# Patient Record
Sex: Female | Born: 1945 | Race: White | Hispanic: No | Smoking: Never smoker
Health system: Southern US, Community
[De-identification: ages and names within clinical notes are randomized; demographics above are authoritative.]

## PROBLEM LIST (undated history)

## (undated) DIAGNOSIS — E785 Hyperlipidemia, unspecified: Secondary | ICD-10-CM

## (undated) DIAGNOSIS — I8290 Acute embolism and thrombosis of unspecified vein: Secondary | ICD-10-CM

## (undated) DIAGNOSIS — I509 Heart failure, unspecified: Secondary | ICD-10-CM

## (undated) DIAGNOSIS — M1008 Idiopathic gout, vertebrae: Secondary | ICD-10-CM

## (undated) DIAGNOSIS — Z5189 Encounter for other specified aftercare: Secondary | ICD-10-CM

## (undated) DIAGNOSIS — N183 Chronic kidney disease, stage 3 unspecified: Secondary | ICD-10-CM

## (undated) DIAGNOSIS — E119 Type 2 diabetes mellitus without complications: Secondary | ICD-10-CM

## (undated) DIAGNOSIS — N289 Disorder of kidney and ureter, unspecified: Secondary | ICD-10-CM

## (undated) DIAGNOSIS — I519 Heart disease, unspecified: Secondary | ICD-10-CM

## (undated) DIAGNOSIS — H269 Unspecified cataract: Secondary | ICD-10-CM

## (undated) DIAGNOSIS — M109 Gout, unspecified: Secondary | ICD-10-CM

## (undated) DIAGNOSIS — Z4502 Encounter for adjustment and management of automatic implantable cardiac defibrillator: Secondary | ICD-10-CM

## (undated) DIAGNOSIS — I10 Essential (primary) hypertension: Secondary | ICD-10-CM

## (undated) DIAGNOSIS — I447 Left bundle-branch block, unspecified: Secondary | ICD-10-CM

## (undated) HISTORY — PX: INSERT / REPLACE / REMOVE PACEMAKER: SUR710

## (undated) HISTORY — DX: Encounter for adjustment and management of automatic implantable cardiac defibrillator: Z45.02

## (undated) HISTORY — DX: Acute embolism and thrombosis of unspecified vein: I82.90

## (undated) HISTORY — DX: Unspecified cataract: H26.9

## (undated) HISTORY — DX: Heart disease, unspecified: I51.9

## (undated) HISTORY — PX: EXPLORATORY LAPAROTOMY: SUR591

## (undated) HISTORY — DX: Encounter for other specified aftercare: Z51.89

## (undated) HISTORY — DX: Essential (primary) hypertension: I10

## (undated) HISTORY — PX: EYE SURGERY: SHX253

## (undated) HISTORY — DX: Hyperlipidemia, unspecified: E78.5

## (undated) HISTORY — DX: Idiopathic gout, vertebrae: M10.08

## (undated) HISTORY — DX: Gout, unspecified: M10.9

## (undated) HISTORY — DX: Disorder of kidney and ureter, unspecified: N28.9

---

## 1995-05-02 ENCOUNTER — Emergency Department: Admit: 1995-05-02 | Payer: Self-pay | Source: Emergency Department | Admitting: Emergency Medical Services

## 1995-05-03 ENCOUNTER — Inpatient Hospital Stay
Admission: EM | Admit: 1995-05-03 | Disposition: A | Discharge status: Home / Self Care | Payer: Self-pay | Source: Emergency Department | Admitting: Cardiovascular Disease

## 2000-03-13 HISTORY — PX: COLONOSCOPY: SHX174

## 2000-04-12 ENCOUNTER — Inpatient Hospital Stay
Admission: EM | Admit: 2000-04-12 | Disposition: A | Discharge status: Home / Self Care | Payer: Self-pay | Source: Emergency Department | Admitting: Internal Medicine

## 2003-07-26 ENCOUNTER — Emergency Department: Admit: 2003-07-26 | Payer: Self-pay | Source: Emergency Department | Admitting: Pediatric Emergency Medicine

## 2004-06-22 ENCOUNTER — Ambulatory Visit
Admit: 2004-06-22 | Disposition: A | Discharge status: Home / Self Care | Payer: Self-pay | Source: Ambulatory Visit | Admitting: Specialist

## 2005-04-15 ENCOUNTER — Emergency Department: Admit: 2005-04-15 | Payer: Self-pay | Source: Emergency Department | Admitting: Emergency Medicine

## 2005-04-15 ENCOUNTER — Observation Stay
Admission: EM | Admit: 2005-04-15 | Disposition: A | Discharge status: Home / Self Care | Payer: Self-pay | Source: Emergency Department | Admitting: Internal Medicine

## 2005-04-15 LAB — CBC WITH AUTO DIFFERENTIAL CERNER
Basophils Absolute: 0 /mm3 (ref 0.0–0.2)
Basophils: 0 % (ref 0–2)
Eosinophils Absolute: 0.1 /mm3 (ref 0.0–0.7)
Eosinophils: 1 % (ref 0–5)
Granulocytes Absolute: 7.7 /mm3 (ref 1.8–8.1)
Hematocrit: 43.9 % (ref 37.0–47.0)
Hgb: 15.1 G/DL (ref 12.0–16.0)
Lymphocytes Absolute: 1.7 /mm3 (ref 0.5–4.4)
Lymphocytes: 17 % (ref 15–41)
MCH: 29 PG (ref 28.0–32.0)
MCHC: 34.4 G/DL (ref 32.0–36.0)
MCV: 84.3 FL (ref 80.0–100.0)
MPV: 7.8 FL (ref 7.4–10.4)
Monocytes Absolute: 0.7 /mm3 (ref 0.0–1.2)
Monocytes: 7 % (ref 0–11)
Neutrophils %: 75 % (ref 52–75)
Platelets: 338 /mm3 (ref 140–400)
RBC: 5.21 /mm3 (ref 4.20–5.40)
RDW: 12.4 % (ref 11.5–15.0)
WBC: 10.3 /mm3 (ref 3.5–10.8)

## 2005-04-15 LAB — BASIC METABOLIC PANEL
BUN: 8 mg/dL (ref 8–20)
CO2: 25 mEq/L (ref 21–30)
Calcium: 9.6 mg/dL (ref 8.6–10.2)
Chloride: 105 mEq/L (ref 98–107)
Creatinine: 0.6 mg/dL (ref 0.6–1.5)
Glucose: 151 mg/dL — ABNORMAL HIGH (ref 70–100)
Potassium: 4.1 mEq/L (ref 3.6–5.0)
Sodium: 141 mEq/L (ref 136–146)

## 2005-04-15 LAB — TROPONIN I QUANTITATIVE LEVEL CERNER: Troponin I: 0 ng/mL

## 2005-04-15 LAB — CKMB: CKMB Confirm: 7 ng/mL — ABNORMAL HIGH (ref 0–5)

## 2005-04-15 LAB — GFR

## 2005-04-15 LAB — CK: Creatine Kinase (CK): 160 U/L — ABNORMAL HIGH (ref 20–140)

## 2005-04-16 LAB — TROPONIN I QUANTITATIVE LEVEL CERNER
Troponin I: 0 ng/mL
Troponin I: 0 ng/mL

## 2005-04-16 LAB — LIPID PANEL
Cholesterol: 139 mg/dL (ref 50–200)
HDL: 43 mg/dL (ref 35–86)
LDL Calculated: 65 mg/dL (ref 0–130)
Triglycerides: 157 mg/dL — ABNORMAL HIGH (ref 35–135)
VLDL Calculated: 31 mg/dL (ref 10–40)

## 2005-04-16 LAB — CK
Creatine Kinase (CK): 133 U/L (ref 20–140)
Creatine Kinase (CK): 120 U/L (ref 20–140)

## 2006-03-13 DIAGNOSIS — B3324 Viral cardiomyopathy: Secondary | ICD-10-CM

## 2006-03-13 HISTORY — DX: Viral cardiomyopathy: B33.24

## 2006-07-31 ENCOUNTER — Inpatient Hospital Stay
Admission: EM | Admit: 2006-07-31 | Disposition: A | Discharge status: Home / Self Care | Payer: Self-pay | Source: Emergency Department | Admitting: Specialist

## 2006-07-31 LAB — BASIC METABOLIC PANEL
BUN: 16 mg/dL (ref 8–20)
BUN: 14 mg/dL (ref 8–20)
CO2: 22 mEq/L (ref 21–30)
CO2: 20 mEq/L — ABNORMAL LOW (ref 21–30)
Calcium: 9.6 mg/dL (ref 8.6–10.2)
Calcium: 9.1 mg/dL (ref 8.6–10.2)
Chloride: 104 mEq/L (ref 98–107)
Chloride: 105 mEq/L (ref 98–107)
Creatinine: 0.8 mg/dL (ref 0.6–1.5)
Creatinine: 0.8 mg/dL (ref 0.6–1.5)
Glucose: 346 mg/dL — ABNORMAL HIGH (ref 70–100)
Glucose: 266 mg/dL — ABNORMAL HIGH (ref 70–100)
Potassium: 4 mEq/L (ref 3.6–5.0)
Potassium: 4.4 mEq/L (ref 3.6–5.0)
Sodium: 137 mEq/L (ref 136–146)
Sodium: 139 mEq/L (ref 136–146)

## 2006-07-31 LAB — CK
Creatine Kinase (CK): 306 U/L — ABNORMAL HIGH (ref 20–140)
Creatine Kinase (CK): 215 U/L — ABNORMAL HIGH (ref 20–140)
Creatine Kinase (CK): 177 U/L — ABNORMAL HIGH (ref 20–140)
Creatine Kinase (CK): 151 U/L — ABNORMAL HIGH (ref 20–140)

## 2006-07-31 LAB — PT AND APTT
PT INR: 0.9 {INR} (ref 0.9–1.1)
PT INR: 0.8 {INR} — ABNORMAL LOW (ref 0.9–1.1)
PT: 11.4 s (ref 10.8–13.3)
PT: 10.4 s — ABNORMAL LOW (ref 10.8–13.3)
PTT: 21 s (ref 21–32)
PTT: 45 s — ABNORMAL HIGH (ref 21–32)

## 2006-07-31 LAB — CBC- CERNER
Hematocrit: 40.4 % (ref 37.0–47.0)
Hgb: 14.1 G/DL (ref 12.0–16.0)
MCH: 29.5 PG (ref 28.0–32.0)
MCHC: 35 G/DL (ref 32.0–36.0)
MCV: 84.3 FL (ref 80.0–100.0)
MPV: 8.2 FL (ref 7.4–10.4)
Platelets: 312 /mm3 (ref 140–400)
RBC: 4.8 /mm3 (ref 4.20–5.40)
RDW: 12.9 % (ref 11.5–15.0)
WBC: 15.1 /mm3 — ABNORMAL HIGH (ref 3.5–10.8)

## 2006-07-31 LAB — CBC WITH AUTO DIFFERENTIAL CERNER
Basophils Absolute: 0 /mm3 (ref 0.0–0.2)
Basophils: 0 % (ref 0–2)
Eosinophils Absolute: 0.3 /mm3 (ref 0.0–0.7)
Eosinophils: 3 % (ref 0–5)
Granulocytes Absolute: 8.4 /mm3 — ABNORMAL HIGH (ref 1.8–8.1)
Hematocrit: 43.2 % (ref 37.0–47.0)
Hgb: 14.9 G/DL (ref 12.0–16.0)
Lymphocytes Absolute: 2.4 /mm3 (ref 0.5–4.4)
Lymphocytes: 20 % (ref 15–41)
MCH: 29 PG (ref 28.0–32.0)
MCHC: 34.5 G/DL (ref 32.0–36.0)
MCV: 84.1 FL (ref 80.0–100.0)
MPV: 7.5 FL (ref 7.4–10.4)
Monocytes Absolute: 1 /mm3 (ref 0.0–1.2)
Monocytes: 8 % (ref 0–11)
Neutrophils %: 69 % (ref 52–75)
Platelets: 312 /mm3 (ref 140–400)
RBC: 5.13 /mm3 (ref 4.20–5.40)
RDW: 12.5 % (ref 11.5–15.0)
WBC: 12.2 /mm3 — ABNORMAL HIGH (ref 3.5–10.8)

## 2006-07-31 LAB — GFR

## 2006-07-31 LAB — B-TYPE NATRIURETIC PEPTIDE: B-Natriuretic Peptide: 77 pg/mL (ref 0–155)

## 2006-07-31 LAB — URINALYSIS
Bilirubin, UA: NEGATIVE
Blood, UA: NEGATIVE
Glucose, UA: >1000
Ketones UA: NEGATIVE
Leukocyte Esterase, UA: NEGATIVE
Nitrite, UA: NEGATIVE
Protein, UR: NEGATIVE
Specific Gravity UA POCT: 1.016 (ref 1.001–1.035)
Urine pH: 5 (ref 5.0–8.0)
Urobilinogen, UA: 0.2 EU/dL (ref 0.2–1.0)

## 2006-07-31 LAB — LIPID PANEL
Cholesterol: 158 mg/dL (ref 50–200)
HDL: 64 mg/dL (ref 35–86)
LDL Calculated: 28 mg/dL (ref 0–130)
Triglycerides: 332 mg/dL — ABNORMAL HIGH (ref 35–135)
VLDL Calculated: 66 mg/dL — ABNORMAL HIGH (ref 10–40)

## 2006-07-31 LAB — PHOSPHORUS: Phosphorus: 4.5 mg/dL (ref 2.5–4.5)

## 2006-07-31 LAB — I-STAT TROPONIN CERNER: i-STAT Troponin: 0.08 ng/mL

## 2006-07-31 LAB — CKMB
CKMB Confirm: 11 ng/mL — ABNORMAL HIGH (ref 0–5)
CKMB Confirm: 10 ng/mL — ABNORMAL HIGH (ref 0–5)
CKMB Confirm: 8 ng/mL — ABNORMAL HIGH (ref 0–5)
CKMB Confirm: 6 ng/mL — ABNORMAL HIGH (ref 0–5)

## 2006-07-31 LAB — MAGNESIUM
Magnesium: 1.5 mg/dL — ABNORMAL LOW (ref 1.6–2.3)
Magnesium: 2 mg/dL (ref 1.6–2.3)

## 2006-07-31 LAB — TROPONIN I QUANTITATIVE LEVEL CERNER
Troponin I: 0.08 ng/mL
Troponin I: 0.05 ng/mL
Troponin I: 0.03 ng/mL

## 2006-08-01 LAB — MAGNESIUM: Magnesium: 2 mg/dL (ref 1.6–2.3)

## 2006-08-01 LAB — BASIC METABOLIC PANEL
BUN: 17 mg/dL (ref 8–20)
CO2: 21 mEq/L (ref 21–30)
Calcium: 9.4 mg/dL (ref 8.6–10.2)
Chloride: 101 mEq/L (ref 98–107)
Creatinine: 0.9 mg/dL (ref 0.6–1.5)
Glucose: 297 mg/dL — ABNORMAL HIGH (ref 70–100)
Potassium: 4.5 mEq/L (ref 3.6–5.0)
Sodium: 136 mEq/L (ref 136–146)

## 2006-08-01 LAB — CBC- CERNER
Hematocrit: 43.6 % (ref 37.0–47.0)
Hgb: 15 G/DL (ref 12.0–16.0)
MCH: 29.3 PG (ref 28.0–32.0)
MCHC: 34.5 G/DL (ref 32.0–36.0)
MCV: 84.9 FL (ref 80.0–100.0)
MPV: 8.2 FL (ref 7.4–10.4)
Platelets: 323 /mm3 (ref 140–400)
RBC: 5.13 /mm3 (ref 4.20–5.40)
RDW: 13.4 % (ref 11.5–15.0)
WBC: 14.2 /mm3 — ABNORMAL HIGH (ref 3.5–10.8)

## 2006-08-01 LAB — GFR

## 2006-08-01 LAB — PHOSPHORUS: Phosphorus: 4.1 mg/dL (ref 2.5–4.5)

## 2006-08-02 LAB — PHOSPHORUS: Phosphorus: 6 mg/dL — ABNORMAL HIGH (ref 2.5–4.5)

## 2006-08-02 LAB — BASIC METABOLIC PANEL
BUN: 30 mg/dL — ABNORMAL HIGH (ref 8–20)
CO2: 22 mEq/L (ref 21–30)
Calcium: 9.5 mg/dL (ref 8.6–10.2)
Chloride: 102 mEq/L (ref 98–107)
Creatinine: 1 mg/dL (ref 0.6–1.5)
Glucose: 323 mg/dL — ABNORMAL HIGH (ref 70–100)
Potassium: 4.2 mEq/L (ref 3.6–5.0)
Sodium: 138 mEq/L (ref 136–146)

## 2006-08-02 LAB — MAGNESIUM: Magnesium: 2 mg/dL (ref 1.6–2.3)

## 2006-08-02 LAB — CBC- CERNER
Hematocrit: 41.5 % (ref 37.0–47.0)
Hgb: 14.2 G/DL (ref 12.0–16.0)
MCH: 29.5 PG (ref 28.0–32.0)
MCHC: 34.3 G/DL (ref 32.0–36.0)
MCV: 85.9 FL (ref 80.0–100.0)
MPV: 7.9 FL (ref 7.4–10.4)
Platelets: 303 /mm3 (ref 140–400)
RBC: 4.83 /mm3 (ref 4.20–5.40)
RDW: 12.7 % (ref 11.5–15.0)
WBC: 13.9 /mm3 — ABNORMAL HIGH (ref 3.5–10.8)

## 2006-08-02 LAB — GFR

## 2006-08-03 LAB — CBC- CERNER
Hematocrit: 37.8 % (ref 37.0–47.0)
Hematocrit: 39.6 % (ref 37.0–47.0)
Hgb: 13 G/DL (ref 12.0–16.0)
Hgb: 13.2 G/DL (ref 12.0–16.0)
MCH: 29.2 PG (ref 28.0–32.0)
MCH: 28.9 PG (ref 28.0–32.0)
MCHC: 34.4 G/DL (ref 32.0–36.0)
MCHC: 33.4 G/DL (ref 32.0–36.0)
MCV: 84.8 FL (ref 80.0–100.0)
MCV: 86.3 FL (ref 80.0–100.0)
MPV: 7.8 FL (ref 7.4–10.4)
MPV: 8 FL (ref 7.4–10.4)
Platelets: 302 /mm3 (ref 140–400)
Platelets: 327 /mm3 (ref 140–400)
RBC: 4.45 /mm3 (ref 4.20–5.40)
RBC: 4.59 /mm3 (ref 4.20–5.40)
RDW: 12.7 % (ref 11.5–15.0)
RDW: 12.6 % (ref 11.5–15.0)
WBC: 15.5 /mm3 — ABNORMAL HIGH (ref 3.5–10.8)
WBC: 14.1 /mm3 — ABNORMAL HIGH (ref 3.5–10.8)

## 2006-08-03 LAB — BASIC METABOLIC PANEL
BUN: 32 mg/dL — ABNORMAL HIGH (ref 8–20)
BUN: 30 mg/dL — ABNORMAL HIGH (ref 8–20)
CO2: 26 mEq/L (ref 21–30)
CO2: 23 mEq/L (ref 21–30)
Calcium: 8.9 mg/dL (ref 8.6–10.2)
Calcium: 8.8 mg/dL (ref 8.6–10.2)
Chloride: 104 mEq/L (ref 98–107)
Chloride: 105 mEq/L (ref 98–107)
Creatinine: 0.9 mg/dL (ref 0.6–1.5)
Creatinine: 1 mg/dL (ref 0.6–1.5)
Glucose: 176 mg/dL — ABNORMAL HIGH (ref 70–100)
Glucose: 165 mg/dL — ABNORMAL HIGH (ref 70–100)
Potassium: 3.7 mEq/L (ref 3.6–5.0)
Potassium: 3.9 mEq/L (ref 3.6–5.0)
Sodium: 138 mEq/L (ref 136–146)
Sodium: 139 mEq/L (ref 136–146)

## 2006-08-03 LAB — MAGNESIUM
Magnesium: 2.4 mg/dL — ABNORMAL HIGH (ref 1.6–2.3)
Magnesium: 2.3 mg/dL (ref 1.6–2.3)

## 2006-08-03 LAB — PHOSPHORUS: Phosphorus: 3.9 mg/dL (ref 2.5–4.5)

## 2010-12-19 LAB — ECG 12-LEAD
Atrial Rate: 102 {beats}/min
Atrial Rate: 104 {beats}/min
Atrial Rate: 114 {beats}/min
Atrial Rate: 123 {beats}/min
P Axis: 58 degrees
P Axis: 67 degrees
P Axis: 68 degrees
P Axis: 81 degrees
P-R Interval: 134 ms
P-R Interval: 152 ms
P-R Interval: 154 ms
P-R Interval: 160 ms
Q-T Interval: 354 ms
Q-T Interval: 370 ms
Q-T Interval: 398 ms
Q-T Interval: 400 ms
QRS Duration: 142 ms
QRS Duration: 142 ms
QRS Duration: 144 ms
QRS Duration: 144 ms
QTC Calculation (Bezet): 506 ms
QTC Calculation (Bezet): 509 ms
QTC Calculation (Bezet): 521 ms
QTC Calculation (Bezet): 523 ms
R Axis: -48 degrees
R Axis: -52 degrees
R Axis: -55 degrees
R Axis: -57 degrees
T Axis: 78 degrees
T Axis: 80 degrees
T Axis: 81 degrees
T Axis: 82 degrees
Ventricular Rate: 102 {beats}/min
Ventricular Rate: 104 {beats}/min
Ventricular Rate: 114 {beats}/min
Ventricular Rate: 123 {beats}/min

## 2010-12-23 LAB — ECG 12-LEAD
Atrial Rate: 86 {beats}/min
P Axis: 62 degrees
P-R Interval: 162 ms
Q-T Interval: 372 ms
QRS Duration: 86 ms
QTC Calculation (Bezet): 445 ms
R Axis: 7 degrees
T Axis: 49 degrees
Ventricular Rate: 86 {beats}/min

## 2010-12-24 LAB — ECG 12-LEAD
Atrial Rate: 79 {beats}/min
Atrial Rate: 83 {beats}/min
P Axis: 76 degrees
P Axis: 76 degrees
P-R Interval: 158 ms
P-R Interval: 164 ms
Q-T Interval: 362 ms
Q-T Interval: 382 ms
QRS Duration: 76 ms
QRS Duration: 86 ms
QTC Calculation (Bezet): 425 ms
QTC Calculation (Bezet): 438 ms
R Axis: -6 degrees
R Axis: 5 degrees
T Axis: 37 degrees
T Axis: 60 degrees
Ventricular Rate: 79 {beats}/min
Ventricular Rate: 83 {beats}/min

## 2010-12-29 NOTE — Progress Notes (Signed)
Nurse Brief Progress Note            PERMANENT            07/31/2006 06:23                         HEALTH SYSTEMS            [facility] - [facility]                        [place]lawn, Tara L. (Patient ID: [id number])                        Date of Service: 07/31/2006 06:23                        HPI/Events of Note: admit to eICU.                                    eICU Interventions: Minor-Communication with other healthcare providers and/or      family                                                Electronically Signed by: Sheldon Silvan (RN)

## 2010-12-29 NOTE — H&P (Signed)
DATE OF BIRTH:                        1945-10-12      ADMISSION DATE:                     04/15/2005            PATIENT LOCATION:                    [facility] 04/16/2005            ATTENDING PHYSICIAN:                  Leroy Libman, MD            HISTORY OF PRESENT ILLNESS:   Tara Perry is a 65 year old Caucasian woman      who is a relatively new patient of Dr. Armando Perry from [facility].  The patient has a past medical history significant for      hypertension, type 2 diabetes, hyperlipidemia, and fibromyalgia.  She has      been in her usual state of health until approximately a month ago when she      developed a persistent cough.  This became more productive over the past      several weeks and she did see Dr. Robley Perry about 10 days ago and was given      a 7-day course of Biaxin 500 mg p.o. b.i.d. which she completed four days      ago.            The patient was awakened from sleep early yesterday morning with substernal      chest pain.  Again, she does have a history of fibromyalgia but she      reported that this pain was not typical for her fibromyalgia complaints.      She did not have any associated diaphoresis, shortness of breath, nausea,      vomiting.  There was no radiation of the pain.  She got up and took a baby      aspirin.  She continued to have the pain and therefore she presented to the      emergency room yesterday.  She was seen in the emergency room and had an      EKG that did not show any ST-T wave changes.  She had a chest x-ray done      which showed slightly prominent interstitial markings but no focal      infiltrate or edema.  The plan was to admit her to the chest pain      observation unit.  However, the patient left the emergency room against      medical advice.  She went home and she states that she never really felt      comfortable again.  She continued to have a dull ache in her chest and for      that reason she returned to the emergency room last  night.  She was then      subsequently admitted for observation.            PAST MEDICAL HISTORY:      1. History of borderline hypertension for which she is not taking any         medications at this time.      2. Type 2 diabetes mellitus which  was diagnosed approximately 10 years         ago.      3. Hyperlipidemia.      4. Fibromyalgia.      5. Past history of a left elbow bursitis secondary to Staph aureus for         which she received IV antibiotics.            PAST SURGICAL HISTORY:  None.            ALLERGIES:  No known drug allergies.            MEDICATIONS:  Her medications prior to admission included Lipitor 20 mg      p.o. daily, glucophage 1000 mg p.o. b.i.d., glyburide 10 mg p.o. daily,      baby aspirin one tablet p.o. daily, and a multivitamin 1 tablet p.o. daily.                  SOCIAL HISTORY:   The patient is married.  She has three grown children and      two grandchildren.  She has no history of tobacco use.  She does have an      occasional glass of wine.  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            FAMILY HISTORY:   The patient's father had coronary disease and had his      first heart attack in his 65s and died of an MI at age 70.  Her mother died      from leukemia.  She has no siblings.            REVIEW OF SYSTEMS:  The patient has not had any fever or sweats but does      report having had chills.  As above, she has had an intermittently      productive cough.  She has not had any shortness of breath.  She has not      had any GI symptoms.  She has not had any GU symptoms.            PHYSICAL EXAMINATION:      GENERAL:  This is a middle-aged woman who is in no acute distress.      VITAL SIGNS:  Temperature 98.1, blood pressure 152/89, heart rate 88,      respiratory rate 16.  Her morning blood sugar was 186.      HEENT:  Pupils equally round and reactive.  Her oropharynx shows moist      mucous membranes without exudate.      NECK:  Supple.       LUNGS:  Clear to auscultation.      HEART:  Regular rate and rhythm without any appreciated murmur.      CHEST:  She does not have any reproducible tenderness on palpation of her      chest wall.      ABDOMEN:  Soft and nontender with good bowel sounds.      EXTREMITIES:  No edema and good pulses.            LABORATORY STUDIES:  White count 10.3, hemoglobin 15.1. hematocrit 43.9,      platelet count 338, sodium 141, potassium 4.1, chloride 105, CO2 25, BUN 8,      creatinine 0.6.  Her troponins are 0.  Cholesterol 139, triglycerides 157.  EKG:  Initial electrocardiogram showed normal sinus rhythm at 83 beats per      minute.  There appears to be minimal voltage criteria for left ventricular      hypertrophy.            CHEST X-RAY:  No focal abnormalities.            IMPRESSION AND SUMMARY:  This is a woman who does have cardiac risk factors      with diabetes, hyperlipidemia, and hypertension; however, I do not believe      that her current episode of chest pain was cardiac in origin.  She does      have a history of fibromyalgia and she has also had a respiratory infection      for approximately a month.  She has been coughing excessively and I believe      that her pain is more consistent with a costochondritis.            PLAN:      1. Will discharge from the cardiac observation unit to home.      2. The patient was advised to take ibuprofen 600 mg p.o. q.8 hours for         three days as an anti-inflammatory agent for presumed costochondritis.      3. If her cough does not improve, she may benefit from a second course of         antibiotics and I have given her a prescription for Avelox 400 mg p.o.         daily x5 days.      4. The patient is going to follow up with Dr. [name] and will have an         outpatient stress thallium test.      5. She was advised to continue with all of her current outpatient         medications.                  Electronic Signing MD: Leroy Libman, MD  ([id number])             D: 04/16/2005 by Leroy Libman, MD      T: 04/17/2005 by Riesa Pope ([id number]) Dorris Carnes: [id number])      cc:  Jeannine Boga, MD          Allen Derry, MD          Leroy Libman, MD

## 2010-12-29 NOTE — Op Note (Unsigned)
DATE OF BIRTH:                        January 22, 1946      ADMISSION DATE:                     07/31/2006            PATIENT LOCATION:                    [id number]            DATE OF CATHETERIZATION:              08/02/2006      CARDIOLOGIST:                       Debbe Odea, MD            CATHETERIZATION NUMBER:            PREOPERATIVE DIAGNOSIS:  CARDIOMYOPATHY.            POSTOPERATIVE DIAGNOSIS:  CARDIOMYOPATHY.            PROCEDURE:  RIGHT AND LEFT HEART CATHETERIZATION, CORONARY ARTERIOGRAPHY,      LEFT VENTRICULOGRAPHY.            COMPLICATIONS:    None.            HISTORY:  The patient is a 65 year old woman with several risk factors for      coronary disease, but no previous cardiac problems, who was admitted on      07/31/2006 with the acute onset of congestive heart failure.  Myocardial      infarction has been ruled out based on the absence of EKG and enzyme      changes.  She did have a left bundle branch block.  In the hospital, her      symptoms have improved with treatment and she is sent to the      catheterization laboratory in order to rule out coronary disease as a cause      of her cardiomyopathy.  An echocardiogram showed an ejection fraction of      30%-35%, without significant valve abnormality.            PAST MEDICAL HISTORY:   Diabetes, hyperlipidemia, fibromyalgia.            ALLERGIES:   Shellfish causes anaphylaxis.            CARDIAC RISK FACTORS:  Diabetes, hyperlipidemia, positive family history of      coronary disease.            HOME MEDICATIONS:   Lipitor 20 mg daily, glyburide 10 mg daily, aspirin 81      mg daily, Centrum, metformin 500 mg b.i.d.            CURRENT MEDICATIONS:  She was pretreated with prednisone, zantac and      Benadryl against a dye reaction.  She is on aspirin 81 mg a day, Lipitor 20      mg daily, Coreg 3.125 mg b.i.d., Rocephin 1 g IV daily, Lovenox 40 mg      daily, Lasix 40 mg IV t.i.d., Micronase 10 mg b.i.d., Zestril 10 mg daily.            PHYSICAL  EXAMINATION:   Blood pressure is 140/80.  Her heart rate is      elevated  at 110-115, in sinus rhythm.  There is no jugular venous      distention.  The lungs reveal rales at the bases.  Cardiac exam reveals a      soft S3.  Extremities - negative.            DESCRIPTION OF PROCEDURE:  The patient was premedicated with 200 mg of IV      Solu-Cortef, 50 mg of IV Benadryl.  Prior to the procedure, she was sedated      with 2 mg of IV Versed.  The patient was brought to the cardiac      catheterization laboratory where the right groin was prepped and draped in      the usual fashion.  After anesthetizing with local Xylocaine, a 6-French      Cordis sheath was placed in the right femoral artery using the Seldinger      technique.  A 7-French Cordis sheath was placed in the right femoral vein      similarly.  Through the venous sheath, a Swan-Ganz catheter was advanced      under fluoroscopic guidance to the wedge position.  A pull back and      pressure was obtained from the wedge through the pulmonary artery, right      ventricle and right atrium.  Thermodilution cardiac outputs were      calculated.  The right heart catheter was removed.  Judkins #3.5 left      coronary catheter was utilized for selective cineangiograms of the left      coronary.  A Judkins #4 right coronary artery catheter was utilized for      nonselective cineangiogram of the anomalous right coronary artery.      Catheters and sheathes were removed and hemostasis was obtained using the      Angio-Seal technique.  The patient tolerated the procedure well and      returned to her room in good condition.            FINDINGS:      Hemodynamics:  The mean wedge pressure was 8 without a significant V-wave      being present.  PA pressure 35/15.  RV pressure 35/6.  RA pressure mean is      6.  Cardiac output 4.5 liters/minute.  There is no gradient across the      aortic valve.            Left ventricle:  The left ventricle is mildly enlarged.  There is  diffuse,      moderate hypokinesis with estimated ejection fraction of 35%-40%.  There is      no visible evidence of mitral regurgitation.            Right coronary:   The right coronary is an anomalous vessel which arises      from high in the anterior portion of the right sinus of valsalva.      Nonselective cineangiograms clearly show no evidence of atherosclerotic      disease.            Left coronary artery to left main trunk and left anterior descending,      circumflex and their respective diagonal and marginal branches are free of      atherosclerotic narrowings.            IMPRESSIONS:      1.   Normal coronary arteries.      2.  Anomalous right coronary artery.      3.   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      4.   No evidence of volume overload at this time by right heart pressures.      5.   Diabetes.      6.   Hyperlipidemia.            PLAN:  The patient will return to the floor.  Her Lasix will be switched to      40 mg p.o. daily.  Coreg will be increased to 6.25 mg b.i.d.  She will      continue the other inpatient medications for the time being.  She will be      instructed as to the importance of a low salt, diabetic diet.  After      discharge, she will follow up in our office in 2-3 weeks.  She will also      follow up with her primary physician, Dr. Armando Gang at [facility]                                    ___________________________________          Date Signed: __________      Debbe Odea, MD  ([id number])            D: 08/02/2006 by Debbe Odea, MD      T: 08/02/2006 by [id number] ([id number]) (Dorris Carnes: [id number])      cc:  Allen Derry, MD          Debbe Odea, MD

## 2010-12-29 NOTE — Consults (Signed)
DATE OF BIRTH:                        1945-07-29      ADMISSION DATE:                     07/31/2006            PATIENT LOCATION:                    [id number]            DATE OF CONSULTATION:                07/31/2006      CONSULTANT:                        Donnie Mesa, MD      CONSULTING SERVICE:                  Cardiology            REFERRING PHYSICIAN:                  Darlyn Chamber, MD            REASON FOR CONSULTATION:   Congestive heart failure.            HISTORY OF PRESENT ILLNESS:   We thank Dr. Rowland Lathe for asking our opinion      regarding the cardiovascular care of this patient, a 65 year old female      with history of diabetes and hypercholesterolemia for whom we are being      asked to consult for congestive heart failure.  The patient has been fairly      healthy up to this point with no significant cardiac problems.  She was in      her usual state of health until last night at midnight.  While she was      having sexual intercourse with her husband, she became acutely short of      breath.  She began coughing up foamy blood-tinged sputum.  She denies any      chest pain, diaphoresis, nausea, vomiting or palpitations.  Sitting forward      helped her breathing.  She came to the emergency department for further      evaluation.  Her subsequent work up has revealed negative cardiac enzymes,      left bundle branch block that is new compared to February 2007, oxygen      desaturation, and evidence of pulmonary edema on chest x-ray.  We have been      consulted for further evaluation both for her congestive heart failure and      her new left bundle branch block.            PAST MEDICAL HISTORY:      1. Type II diabetes mellitus.      2. Hypercholesterolemia.      3. Fibromyalgia.            FAMILY HISTORY:   Her father had myocardial infarction in his 1s.            SOCIAL HISTORY:  The patient denies any tobacco or illicit drug use.  She      drinks alcohol on occasions.            ALLERGIES:    Shellfish.  MEDICATIONS:      1. Lipitor 20 milligrams daily.      2. Glyburide 10 milligrams daily.      3. Aspirin 81 milligrams daily.      4. Centrum Silver.      5. Metformin 500 milligrams b.i.d.            REVIEW OF SYSTEMS:   Comprehensive review of systems, including      constitutional, eyes, ears, nose, mouth, throat, cardiovascular,      gastrointestinal, genitourinary, musculoskeletal, integumentary,      respiratory, neurologic, psychiatric, and endocrine, is negative other      than what is mentioned already in the history of present illness.            PHYSICAL EXAMINATION:   Temperature 96.5 degrees,  heart rate 100,  blood      pressure  126/72,  respiratory rate 18,  oxygen saturation _____.            GENERAL:  Patient is in no acute distress.      HEENT:  No scleral icterus or conjunctival pallor, moist mucous membranes.            NECK:  Elevated jugular venous pressure of 9 centimeters of water, no      thyromegaly,  normal carotid upstrokes without bruits.      CARDIAC: Normal apical impulse, regular rate and rhythm, with normal S1 and      S2, and no murmurs, rubs, or gallops.      CHEST:  Bibasilar crackles,  normal respiratory effort.      ABDOMEN: No abdominal bruits, masses, or hepatosplenomegaly, nontender,      non-distended, good bowel sounds.      EXTREMITIES: No clubbing, cyanosis, or edema,  2+ DP, PT, and femoral      pulses bilaterally without bruits.      SKIN: No rash or jaundice.      NEUROLOGIC:  Alert and oriented  to time, place and person, normal mood and      affect.      MUSCULOSKELETAL: Normal muscle strength and tone.            LABORATORY DATA:   Significant for white blood count of 14.1, blood urea      nitrogen 14, creatinine 0.8.  Three sets of negative Troponins.            Electrocardiogram reveals a left bundle branch block at rate of 102 beats      per minute with no other significant changes.  Her last electrocardiogram      on record was from  February 2007 which, at that time, revealed normal sinus      rhythm with no left bundle branch block.            IMPRESSION:      1. New onset congestive heart failure.  Given her negative Troponins and         her left bundle branch block that is new compared to February 2007, I         suspect that she developed cardiomyopathy at some point over the last 1         1/2 years and that this is basically her first symptomatic presentation         of congestive heart failure.  I do not think she developed new onset         cardiomyopathy acutely this time since her cardiac  enzymes have been         negative.  At the very least, she does not appear to have had an acute         myocardial infarction.      2. New left bundle branch block, new since February 2007.      3. Type II diabetes mellitus.      4. Fibromyalgia.      5. Cardiomyopathy of uncertain etiology.      6. Hypertriglyceridemia.      7. Shellfish allergy.            PLAN:      1. Lasix 40 milligrams intravenously twice a day and more as needed to         achieve diuresis of at least one to two liters per day.      2. If her next set of enzymes is negative, I would stop her full dose         anticoagulation since she likely does not have an acute coronary         syndrome or myocardial infarction.  She can simply have deep venous         thrombosis prophylaxis dose.      3. One more set of cardiac enzymes.      4. Assuming that she rules out on her last set of cardiac enzymes, I would         simply continue to diurese her to the point that she is euvolemic over         the next day.  Once she is clinically euvolemic and able to lie flat, I         would then proceed with left and right heart catheterization for the         purpose of both diagnosing the etiology of her cardiomyopathy and         assessing her right heart hemodynamics.  We can likely do this on         Thursday if she has diuresed well overnight.      5. I would start her on lisinopril 5  milligrams per day.  Once she is more         euvolemic, likely tomorrow, we can start her on Coreg 3.125 milligrams         twice a day.            Thank you for the opportunity to participate in this patient's care.                  Electronic Signing MD: Donnie Mesa, MD  ([id number])            D: 08/01/2006 by Donnie Mesa, MD      T: 08/01/2006 by bp ([id number]) (N: [id number])      cc:  Suella Grove, MD          Donnie Mesa, MD

## 2010-12-29 NOTE — Discharge Summary (Unsigned)
DATE OF BIRTH:                        [birth year]            ADMISSION DATE:                     07/31/2006      DISCHARGE DATE:                     08/03/2006            ATTENDING PHYSICIAN:                  Suella Grove, MD            CHIEF COMPLAINT AND HISTORY OF PRESENT ILLNESS:  Please see dictated      history and physical.  This is a very pleasant 65 year old white female who      came in because of pulmonary edema with congestive heart failure with      history of diabetes, hypertension, positive family as well as      hyperlipidemia.            HOSPITAL COURSE:  Her hospital course here was significant for improved      symptoms with diuresis. The etiology of all of this was unclear.  The      patient's initial course was to see if sh had a myocardial infarction.  Her      troponins were 0.08, 0.05, and 0.03 all negative.  Her CK-MB were 10, 8,      and 6.  Her lipid profile showed a cholesterol of 158, triglyceride of 332,      and HDL of 64 and LDL of 28, VLDL was 66.  PT/INR was within normal limits.      The patient was diuresed with Lasix. Started to feel better.            She was in the CCU initially and was transferred to the [facility].  Her chest      x-ray on 08/02/2006 showed resolved left basilar opacity with no edema and      all evidence of CHF was essentially resolved.  An echocardiogram was done.      The echocardiogram showed that she had an ejection fraction of 30-40%.  No      regional wall motion abnormalities. There was moderate diffuse      hyperkinesis.  Wall thickness was normal. Mitral valve showed no stenosis,      no regurgitation.  Aortic valve showed no regurgitation, no stenosis.      There was no pericardial effusion.  The impression was moderately reduced      ejection fraction with abnormal septal motion consistent with some      conduction delay and normal valvular structures.            The patient underwent a cardiac catheterization by Dr. Gilmer Mor on      08/02/2006,  and the results showed normal coronary arteries, anomalous      right coronary artery, nonischemic cardiomyopathy with an ejection fraction      of 35-40%, no evidence of volume overload at this time by right heart      pressures.            The patient is doing quite well. Her discharge medications include the      following.  DISCHARGE MEDICATIONS:  She will be discharged on aspirin 81 mg a day,      Lipitor 20 mg every day, Coreg 6.25 mg q.12 h., Lasix 40 mg q.a.m.,      glyburide 10 mg every day, and lisinopril 10 mg every day, and      nitroglycerin tablets 0.4 mg sublingual p.r.n.            FOLLOWUP:  She is to follow with Dr. Armando Gang and Dr. Nash She[facility]      [facility].                                          ___________________________________          Date Signed: __________      Suella Grove, MD  ([id number])            D: 08/03/2006 by Suella Grove, MD      T: 08/04/2006 by [id number] ([id number]) (N: [id number])      cc:  Suella Grove, MD          Allen Derry, MD          Donnie Mesa, MD
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
## 2011-01-25 ENCOUNTER — Encounter: Payer: Self-pay | Admitting: Anesthesiology

## 2011-01-25 ENCOUNTER — Emergency Department: Payer: Medicare Other | Admitting: Anesthesiology

## 2011-01-25 ENCOUNTER — Emergency Department: Payer: Medicare Other

## 2011-01-25 ENCOUNTER — Inpatient Hospital Stay: Payer: Medicare Other | Admitting: Surgery

## 2011-01-25 ENCOUNTER — Encounter
Admission: EM | Disposition: A | Discharge status: Home / Self Care | Payer: Self-pay | Source: Home / Self Care | Attending: Surgery

## 2011-01-25 ENCOUNTER — Inpatient Hospital Stay
Admission: EM | Admit: 2011-01-25 | Discharge: 2011-01-28 | DRG: 357 | Disposition: A | Discharge status: Home / Self Care | Payer: Medicare Other | Attending: Surgery | Admitting: Surgery

## 2011-01-25 DIAGNOSIS — K55029 Acute infarction of small intestine, extent unspecified: Secondary | ICD-10-CM

## 2011-01-25 DIAGNOSIS — K5289 Other specified noninfective gastroenteritis and colitis: Secondary | ICD-10-CM | POA: Diagnosis present

## 2011-01-25 DIAGNOSIS — K55059 Acute (reversible) ischemia of intestine, part and extent unspecified: Secondary | ICD-10-CM

## 2011-01-25 DIAGNOSIS — D72829 Elevated white blood cell count, unspecified: Secondary | ICD-10-CM | POA: Diagnosis present

## 2011-01-25 DIAGNOSIS — K659 Peritonitis, unspecified: Principal | ICD-10-CM | POA: Diagnosis present

## 2011-01-25 DIAGNOSIS — E872 Acidosis, unspecified: Secondary | ICD-10-CM | POA: Diagnosis present

## 2011-01-25 DIAGNOSIS — E119 Type 2 diabetes mellitus without complications: Secondary | ICD-10-CM | POA: Diagnosis present

## 2011-01-25 DIAGNOSIS — E785 Hyperlipidemia, unspecified: Secondary | ICD-10-CM | POA: Diagnosis present

## 2011-01-25 LAB — HEPATIC FUNCTION PANEL
ALT: 26 U/L (ref 3–36)
AST (SGOT): 27 U/L (ref 10–41)
Albumin/Globulin Ratio: 1.5 (ref 1.1–1.8)
Albumin: 4 g/dL (ref 3.4–4.9)
Alkaline Phosphatase: 67 U/L (ref 43–112)
Bilirubin Direct: 0.2 mg/dL (ref 0.0–0.3)
Bilirubin Indirect: 0.7 mg/dL (ref 0.1–0.9)
Bilirubin, Total: 0.9 mg/dL (ref 0.1–1.0)
Globulin: 2.7 g/dL (ref 2.0–3.7)
Protein, Total: 6.7 g/dL (ref 6.0–8.0)

## 2011-01-25 LAB — URINALYSIS, REFLEX TO MICROSCOPIC EXAM IF INDICATED
Bilirubin, UA: NEGATIVE
Ketones UA: 10
Leukocyte Esterase, UA: NEGATIVE
Nitrite, UA: NEGATIVE
Protein, UR: 30 — AB
Specific Gravity UA POCT: 1.021 (ref 1.001–1.035)
Urine pH: 5.5 (ref 5.0–8.0)
Urobilinogen, UA: NORMAL mg/dL

## 2011-01-25 LAB — LIPASE: Lipase: 81 U/L (ref 32–219)

## 2011-01-25 LAB — MAN DIFF ONLY
Band Neutrophils Absolute: 7.68 10*3/uL — ABNORMAL HIGH (ref 0.00–1.00)
Band Neutrophils: 49 % — ABNORMAL HIGH (ref 0–9)
Lymphocytes Absolute Manual: 0.28 10*3/uL — ABNORMAL LOW (ref 0.50–4.40)
Lymphocytes Manual: 2 % — ABNORMAL LOW (ref 15–41)
Metamyelocytes Absolute: 1 10*3/uL — ABNORMAL HIGH
Metamyelocytes: 6 % — ABNORMAL HIGH
Monocytes Absolute: 0.85 10*3/uL (ref 0.00–1.20)
Monocytes Manual: 5 % (ref 1–11)
Neutrophils Absolute Manual: 5.97 10*3/uL (ref 1.80–8.10)
Nucleated RBC: 0 /100 WBC
Segmented Neutrophils: 38 % — ABNORMAL LOW (ref 52–75)

## 2011-01-25 LAB — BASIC METABOLIC PANEL
BUN: 29 mg/dL — ABNORMAL HIGH (ref 8–20)
CO2: 18 mEq/L — ABNORMAL LOW (ref 21–30)
Calcium: 8.9 mg/dL (ref 8.6–10.2)
Chloride: 98 mEq/L (ref 98–107)
Creatinine: 1.2 mg/dL (ref 0.6–1.5)
Glucose: 269 mg/dL — ABNORMAL HIGH (ref 70–100)
Potassium: 4.2 mEq/L (ref 3.6–5.0)
Sodium: 133 mEq/L — ABNORMAL LOW (ref 136–146)

## 2011-01-25 LAB — I-STAT CG4 VENOUS CARTRIDGE
Lactic Acid I-Stat: 3.9 mEq/L — ABNORMAL HIGH (ref 0.5–2.2)
i-STAT Base Excess Venous: -4 mEq/L
i-STAT FIO2: 21
i-STAT HCO3 Bicarbonate Venous: 21.4 mEq/L
i-STAT O2 Saturation Venous: 42 %
i-STAT Patient Temperature: 97.7
i-STAT Total CO2 Venous: 23 mEq/L
i-STAT pCO2 Venous: 37.3 mmHg
i-STAT pH Venous: 7.365
i-STAT pO2 Venous: 24 mmHg

## 2011-01-25 LAB — CBC AND DIFFERENTIAL
Hematocrit: 39.4 % (ref 37.0–47.0)
Hgb: 13.3 g/dL (ref 12.0–16.0)
MCH: 28.8 pg (ref 28.0–32.0)
MCHC: 33.8 g/dL (ref 32.0–36.0)
MCV: 85.3 fL (ref 80.0–100.0)
MPV: 9.8 fL (ref 9.4–12.3)
Platelets: 226 10*3/uL (ref 140–400)
RBC: 4.62 10*6/uL (ref 4.20–5.40)
RDW: 13 % (ref 12–15)
WBC: 15.8 10*3/uL — ABNORMAL HIGH (ref 3.50–10.80)

## 2011-01-25 LAB — GFR: EGFR: 45

## 2011-01-25 LAB — CELL MORPHOLOGY
Cell Morphology: NORMAL
Platelet Estimate: NORMAL

## 2011-01-25 LAB — TYPE AND SCREEN
AB Screen Gel: NEGATIVE
ABO Rh: B POS

## 2011-01-25 SURGERY — LAPAROTOMY, ABDOMINAL EXPLORATION
Anesthesia: Anesthesia General | Site: Abdomen | Wound class: Clean

## 2011-01-25 MED ORDER — ONDANSETRON HCL 4 MG/2ML IJ SOLN
4.00 mg | Freq: Once | INTRAMUSCULAR | Status: AC
Start: 2011-01-25 — End: 2011-01-25
  Administered 2011-01-25: 4 mg via INTRAVENOUS
  Filled 2011-01-25: qty 1

## 2011-01-25 MED ORDER — MORPHINE SULFATE 2 MG/ML IJ SOLN
4.00 mg | Freq: Once | INTRAMUSCULAR | Status: AC
Start: 2011-01-25 — End: 2011-01-25
  Administered 2011-01-25: 4 mg via INTRAVENOUS
  Filled 2011-01-25: qty 2

## 2011-01-25 MED ORDER — PROMETHAZINE HCL 25 MG/ML IJ SOLN
6.2500 mg | Freq: Once | INTRAMUSCULAR | Status: AC | PRN
Start: 2011-01-25 — End: 2011-01-25

## 2011-01-25 MED ORDER — LACTATED RINGERS IV SOLN
INTRAVENOUS | Status: DC | PRN
Start: 2011-01-25 — End: 2011-01-25

## 2011-01-25 MED ORDER — FENTANYL CITRATE 0.05 MG/ML IJ SOLN
INTRAMUSCULAR | Status: DC | PRN
Start: 2011-01-25 — End: 2011-01-25
  Administered 2011-01-25: 100 ug via INTRAVENOUS
  Administered 2011-01-25: 50 ug via INTRAVENOUS
  Administered 2011-01-25: 100 ug via INTRAVENOUS

## 2011-01-25 MED ORDER — ONDANSETRON HCL 4 MG/2ML IJ SOLN
4.0000 mg | Freq: Once | INTRAMUSCULAR | Status: AC | PRN
Start: 2011-01-25 — End: 2011-01-25

## 2011-01-25 MED ORDER — MIDAZOLAM HCL 2 MG/2ML IJ SOLN
INTRAMUSCULAR | Status: DC | PRN
Start: 2011-01-25 — End: 2011-01-25
  Administered 2011-01-25: 1 mg via INTRAVENOUS

## 2011-01-25 MED ORDER — ACETAMINOPHEN 325 MG PO TABS
650.00 mg | ORAL_TABLET | Freq: Four times a day (QID) | ORAL | Status: DC | PRN
Start: 2011-01-25 — End: 2011-01-27
  Filled 2011-01-25 (×4): qty 2

## 2011-01-25 MED ORDER — SUCCINYLCHOLINE CHLORIDE 20 MG/ML IJ SOLN
INTRAMUSCULAR | Status: DC | PRN
Start: 2011-01-25 — End: 2011-01-25
  Administered 2011-01-25: 120 mg via INTRAVENOUS

## 2011-01-25 MED ORDER — SODIUM CHLORIDE 0.9 % IR SOLN
Status: DC | PRN
Start: 2011-01-25 — End: 2011-01-25
  Administered 2011-01-25: 3000 mL

## 2011-01-25 MED ORDER — ROCURONIUM BROMIDE 50 MG/5ML IV SOLN
INTRAVENOUS | Status: DC | PRN
Start: 2011-01-25 — End: 2011-01-25
  Administered 2011-01-25: 40 mg via INTRAVENOUS

## 2011-01-25 MED ORDER — SODIUM CHLORIDE 0.9 % IJ SOLN
3.0000 mL | Freq: Three times a day (TID) | INTRAMUSCULAR | Status: DC
Start: 2011-01-25 — End: 2011-01-26
  Filled 2011-01-25: qty 10

## 2011-01-25 MED ORDER — LIDOCAINE HCL 2 % IJ SOLN
INTRAMUSCULAR | Status: DC | PRN
Start: 2011-01-25 — End: 2011-01-25
  Administered 2011-01-25: 50 mg

## 2011-01-25 MED ORDER — SODIUM CHLORIDE 0.9 % IV SOLN
INTRAVENOUS | Status: DC | PRN
Start: 2011-01-25 — End: 2011-01-25

## 2011-01-25 MED ORDER — ONDANSETRON HCL 4 MG/2ML IJ SOLN
INTRAMUSCULAR | Status: DC | PRN
Start: 2011-01-25 — End: 2011-01-25
  Administered 2011-01-25: 4 mg via INTRAVENOUS

## 2011-01-25 MED ORDER — PIPERACILLIN SOD-TAZOBACTAM SO 4.5 (4-0.5) G IV SOLR
INTRAVENOUS | Status: AC
Start: 2011-01-25 — End: 2011-01-25
  Administered 2011-01-25: 4500 mg
  Filled 2011-01-25: qty 4.5

## 2011-01-25 MED ORDER — HYDROMORPHONE PCA 0.2 MG/ML 100 ML (OUTSOURCED)
INTRAVENOUS | Status: AC
Start: 2011-01-25 — End: 2011-01-26
  Filled 2011-01-25: qty 100

## 2011-01-25 MED ORDER — METOPROLOL TARTRATE 1 MG/ML IV SOLN
INTRAVENOUS | Status: DC | PRN
Start: 2011-01-25 — End: 2011-01-25
  Administered 2011-01-25: 1 mg via INTRAVENOUS

## 2011-01-25 MED ORDER — ETOMIDATE 2 MG/ML IV SOLN
INTRAVENOUS | Status: DC | PRN
Start: 2011-01-25 — End: 2011-01-25
  Administered 2011-01-25: 20 mg via INTRAVENOUS

## 2011-01-25 MED ORDER — NALOXONE HCL 0.4 MG/ML IJ SOLN
0.10 mg | INTRAMUSCULAR | Status: DC | PRN
Start: 2011-01-25 — End: 2011-01-26
  Filled 2011-01-25: qty 1

## 2011-01-25 MED ORDER — PIPERACILLIN SOD-TAZOBACTAM SO 4.5 (4-0.5) G IV SOLR
4.50 g | Freq: Once | INTRAVENOUS | Status: DC
Start: 2011-01-25 — End: 2011-01-26
  Filled 2011-01-25: qty 4.5

## 2011-01-25 MED ORDER — DIPHENHYDRAMINE HCL 50 MG/ML IJ SOLN
12.50 mg | Freq: Four times a day (QID) | INTRAMUSCULAR | Status: DC | PRN
Start: 2011-01-25 — End: 2011-01-27

## 2011-01-25 MED ORDER — DEXAMETHASONE SODIUM PHOSPHATE 4 MG/ML IJ SOLN
INTRAMUSCULAR | Status: DC | PRN
Start: 2011-01-25 — End: 2011-01-25
  Administered 2011-01-25: 10 mg via INTRAVENOUS

## 2011-01-25 MED ORDER — SODIUM CHLORIDE 0.9 % IV BOLUS
1000.00 mL | Freq: Once | INTRAVENOUS | Status: AC
Start: 2011-01-25 — End: 2011-01-25
  Administered 2011-01-25: 1000 mL via INTRAVENOUS

## 2011-01-25 MED ORDER — FAMOTIDINE 10 MG/ML IV SOLN
INTRAVENOUS | Status: DC | PRN
Start: 2011-01-25 — End: 2011-01-25
  Administered 2011-01-25: 20 mg via INTRAVENOUS

## 2011-01-25 MED ORDER — HYDROMORPHONE PCA 0.2 MG/ML 100 ML (OUTSOURCED)
INTRAVENOUS | Status: DC
Start: 2011-01-25 — End: 2011-01-27
  Filled 2011-01-25: qty 100

## 2011-01-25 MED ORDER — GLYCOPYRROLATE 0.2 MG/ML IJ SOLN
INTRAMUSCULAR | Status: DC | PRN
Start: 2011-01-25 — End: 2011-01-25
  Administered 2011-01-25: .7 mg via INTRAVENOUS

## 2011-01-25 MED ORDER — NEOSTIGMINE METHYLSULFATE 1 MG/ML IJ SOLN
INTRAMUSCULAR | Status: DC | PRN
Start: 2011-01-25 — End: 2011-01-25
  Administered 2011-01-25: 5 mg via INTRAMUSCULAR

## 2011-01-25 SURGICAL SUPPLY — 41 items
APPLCATOR CHLORAPREP 26ML (Prep) ×1 IMPLANT
BANDAGE ADHESIVE L2 YD X W6 IN STRETCH (Bandage) ×1
BANDAGE ADHESIVE L2 YD X W6 IN STRETCH NONWOVEN POROUS COVER-ROLL (Bandage) ×1 IMPLANT
BLADE SURGICAL CLIPPER PIVOT ADJUSTABLE (Blade) ×1
BLADE SURGICAL CLIPPER PIVOT ADJUSTABLE HEAD 9661 PURPLE (Blade) ×1 IMPLANT
CLIP INTERNAL MEDIUM CHEVRON 6 CARTRIDGE (Clips)
CLIP INTERNAL MEDIUM CHEVRON 6 CARTRIDGE LIGATE TRIANGULATE CROSS (Clips) IMPLANT
CLIP INTERNAL MEDIUM LARGE CHEVRON 6 (Clips) IMPLANT
CLIP LIGACLIP EXTRA MED/LG (Clips) ×1 IMPLANT
CLOSURE STERI-STRIP 1X5IN (Dressing) ×3 IMPLANT
DRESSING FLEXZAN 4X4 (Dressing) ×1 IMPLANT
GAUZE COVER ROLL 12X10YD L/F (Dressing) ×1 IMPLANT
GAUZE SPONGE CURITY 12PLY 4X8 (Dressing) ×1 IMPLANT
GLOVE SURG BIOGEL PF LTX SZ7.0 (Glove) ×1 IMPLANT
MASTISOL VIAL 2/3CC STRL (Skin Closure) ×1 IMPLANT
NEEDLE REG BEVEL 19GX1.5IN (Needles) ×2 IMPLANT
PACK LAPAROTOMY CUSTOM (Pack) ×1 IMPLANT
PAD ELECTROSRG GRND REM W CRD (Procedure Accessories) ×1 IMPLANT
SLEEVE SEQUEN COMP KNEE REG (Procedure Accessories) ×1 IMPLANT
SPONGE LAPAROTOMY L18 IN X W18 IN (Sponge) ×1
SPONGE LAPAROTOMY L18 IN X W18 IN PREWASH WHITE (Sponge) ×1 IMPLANT
SPONGE PEANUT C5 HOLDER DISSECTOR XRAY (Sponge)
SPONGE PEANUT C5 HOLDER DISSECTOR XRAY DETECTABLE OD3/8 IN DUKAL (Sponge) IMPLANT
STAPLER LDNG  GIA 80-4.8 (Staplers) ×1 IMPLANT
STAPLER REGULAR TISSUE L60 MM X H3.8 MM (Staplers)
STAPLER REGULAR TISSUE L60 MM X H3.8 MM 4 ROW LINEAR CUTTER QUICK (Staplers) IMPLANT
STAPLER REGULAR TISSUE L60 MM X H4.8 MM (Staplers) ×1
STAPLER REGULAR TISSUE L60 MM X H4.8 MM ENDOSCOPIC 2 ROW LINEAR CUTTER (Staplers) ×1 IMPLANT
SUTURE COATED VICRYL 3-0 SH L27 IN BRAID (Suture) ×1
SUTURE COATED VICRYL 3-0 SH L27 IN BRAID COATED VIOLET ABSORBABLE (Suture) ×1 IMPLANT
SUTURE PDS II 1 TP1 48" (Suture) ×1 IMPLANT
SUTURE SILK 3-0 (Suture) ×1 IMPLANT
SUTURE SILK PERMA HAND BLACK 2-0 L18 IN (Suture) ×1
SUTURE SILK PERMA HAND BLACK 2-0 L18 IN BRAID TIES 12 STRAND PRECUT (Suture) ×1 IMPLANT
SUTURE SILK PERMA HAND BLACK 3-0 L18 IN (Suture) ×1
SUTURE SILK PERMA HAND BLACK 3-0 L18 IN BRAID TIES 12 STRAND PRECUT (Suture) ×1 IMPLANT
SUTURE VICRYL 2-0 SH 27IN (Suture) ×1 IMPLANT
SUTURE VICRYL 3-0 SH 8X18IN (Suture) ×3 IMPLANT
SUTURE VICRYL 4-0 PS1 27IN (Suture) ×1 IMPLANT
SYRINGE LUER LOCK 10CC (Syringes, Needles) ×2 IMPLANT
TOWEL STERILE REUSABLE 8PK (Procedure Accessories) ×1 IMPLANT

## 2011-01-25 NOTE — ED Provider Notes (Signed)
History     Chief Complaint   Patient presents with   . Abdominal Pain   65 y/o female k/c of dm and hyperlipidemia, came today with hx of lower abdominal pain, started yesterday, 10/10, tearing in nature, non radiating. Today pt had n/v 1 time. Last bowel movement was today morning. She denies fever, chills, dysuria or change in her bowel habits. She went to her PCP (Dr Jacqlyn Larsen) who advised her to go to the ED as her didn't hear bowel movements.  Patient is a 65 y.o. female presenting with abdominal pain. The history is provided by the patient.   Abdominal Pain  The primary symptoms of the illness include abdominal pain, nausea and vomiting. The current episode started yesterday. The onset of the illness was gradual. The problem has not changed since onset.  The abdominal pain began yesterday. The pain came on gradually. The abdominal pain has been unchanged since its onset. The abdominal pain is located in the suprapubic region. The abdominal pain does not radiate. The severity of the abdominal pain is 10/10. The abdominal pain is relieved by nothing.   Nausea began today.   The patient has not had a change in bowel habit. Symptoms associated with the illness do not include chills, constipation, urgency or frequency.       Past Medical History   Diagnosis Date   . Diabetes mellitus    . 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 No pertinent past surgical history.    History reviewed. No pertinent family history.    Current Facility-Administered Medications   Medication Dose Route Frequency Provider Last Rate Last Dose   . morphine injection 4 mg  4 mg Intravenous Once Eduardo Osier, MD   4 mg at 01/25/11 1559   . ondansetron (ZOFRAN) injection 4 mg  4 mg Intravenous Once Eduardo Osier, MD   4 mg at 01/25/11 1558   . sodium chloride 0.9 % bolus 1,000 mL  1,000 mL Intravenous Once Mohammed A Almulhim, MD         Current Outpatient Prescriptions    Medication Sig Dispense Refill   . aspirin 81 MG EC tablet Take 81 mg by mouth daily.         . Atorvastatin Calcium (LIPITOR PO) Take by mouth.         . Carvedilol (COREG PO) Take by mouth.         Marland Kitchen GLIMEPIRIDE PO Take by mouth.         Marland Kitchen LISINOPRIL PO Take by mouth.         . METFORMIN HCL ER, MOD, PO Take by mouth.         . Multiple Vitamins-Minerals (CENTRUM PO) Take by mouth.         . SitaGLIPtin Phosphate (JANUVIA PO) Take by mouth.         . vitamin B-12 (CYANOCOBALAMIN) 100 MCG tablet Take 50 mcg by mouth daily.             Allergies   Allergen Reactions   . Shellfish-Derived Products        History   Substance Use Topics   . Smoking status: Never Smoker    . Smokeless tobacco: Not on file   . Alcohol Use: Yes      social       Review of Systems   Constitutional: Negative for chills.  Gastrointestinal: Positive for nausea, vomiting and abdominal pain. Negative for constipation.   Genitourinary: Negative for urgency and frequency.   All other systems reviewed and are negative.        Physical Exam   BP 106/53  Pulse 106  Temp 98.8 F (37.1 C)  Ht 1.626 m  Wt 61.689 kg  BMI 23.34 kg/m2  SpO2 95%    Physical Exam   Constitutional: She is oriented to person, place, and time. She appears well-developed and well-nourished.   HENT:   Head: Normocephalic.   Cardiovascular: Regular rhythm and normal heart sounds.  Tachycardia present.    Pulmonary/Chest: Effort normal and breath sounds normal.   Abdominal: Soft. Bowel sounds are normal. She exhibits no distension. There is no tenderness. There is no rebound.   Neurological: She is alert and oriented to person, place, and time.   Skin: Skin is warm and dry.       ED Course   Procedures    MDM      6:07 PM discussed with surgery. Will come consult pt in ED.    [name], MD  01/25/11 2225

## 2011-01-25 NOTE — Transfer of Care (Signed)
Anesthesia Transfer of Care Note    [name]    Procedures performed: Procedure(s) with comments:  LAPAROTOMY, ABDOMINAL EXPLORATION - WITH WASHOUT.    Anesthesia type: General ETT    Patient location:Phase I PACU    Last vitals:   Filed Vitals:    01/25/11 2117   BP: 118/54   Pulse: 102   Temp: 101 F (38.3 C)   Resp: 18       Post pain: Patient not complaining of pain, continue current therapy     Mental Status:sedated    Respiratory Function: tolerating face mask    Cardiovascular: stable    Nausea/Vomiting: patient not complaining of nausea or vomiting    Hydration Status: adequate    Regional Anesthesia: no apparent complications    Post assessment: no apparent anesthetic complications

## 2011-01-25 NOTE — Anesthesia Preprocedure Evaluation (Addendum)
Anesthesia Evaluation    AIRWAY      Neck ROM: full  Mouth Opening:full   CARDIOVASCULAR    cardiovascular exam normal     DENTAL     PULMONARY    pulmonary exam normal     OTHER FINDINGS          Anesthesia Plan    ASA 3   general   Detailed anesthesia plan: general endotracheal      Post op pain management: PCA  intravenous induction       Plan discussed with CRNA.        <IHSANLANDD>

## 2011-01-25 NOTE — Brief Op Note (Signed)
BRIEF OP NOTE    Date Time: 01/25/2011 10:26 PM    Patient Name:   Tara Perry    Date of Operation:   01/25/2011    Providers Performing:   Surgeon(s):  Tomma Rakers, MD    Assistant (s): Arlana Lindau, MD    Operative Procedure:   Procedure(s):  LAPAROTOMY, ABDOMINAL EXPLORATION    Preoperative Diagnosis:   Pre-Op Diagnosis Codes:     * Peritonitis [567.9]    Postoperative Diagnosis:   Jejunal inflammation, peritonitis    Anesthesia:   General    Estimated Blood Loss:    10 cc    Implants:   * No implants in log *      Drains:   Drain #1: none  Drain #2: none  Drain #3: none  Drain #4: none    Specimens:       Findings:   Inflamed jejunum approximately 50 cm from Ligament of Treitz no perforation, pus in abdomen appendix normal, colon normal, uterus and ovaries normal, stomach normal    Complications:   none      Signed by: Tomma Rakers, MD                                                                               [facility]

## 2011-01-25 NOTE — ED Notes (Signed)
Attending Note:    65 yo F with h/o DM to ED for lower abd pain onset yesterday. No N/V. Described as 'tearing pain.' Went to see PMD who referred her to ED for further eval. No fever. Normal BM.    PE: VSS  axox3  s1s2  cta b/l  Soft, +lower abd ttp. No peritoneal signs.  No c/c/e    A/P: 65 yo F with lower abd pain  --labs, CT, IVF  --pain control.    [name], MD  01/25/11 2229

## 2011-01-25 NOTE — ED Notes (Signed)
C/o lower abd pain onset yesterday, worsening this morning. Denies n/v/c/d. Denies urinary symptoms. No fevers

## 2011-01-25 NOTE — ED Provider Notes (Signed)
Seen by surgery for thickened small bowel and concern for ischemic bowel. Abdominal exam has worsened. They will take her to OR. Has already received Abx and IVF.    [name], MD  01/25/11 [phone]

## 2011-01-25 NOTE — Op Note (Signed)
DATE OF PROCEDURE:  01/25/2011    ATTENDING: Lacretia Nicks, MD  ASSISTANT: Arlana Lindau, MD    PREOPERATIVE DIAGNOSIS:  Peritonitis.       POSTOPERATIVE DIAGNOSES:  Jejunal inflammation, peritonitis.     TITLE OF PROCEDURE:  Exploratory laparotomy.     INDICATIONS:  The patient is a 65 year old female who began having abdominal pain  approximately 6:00 a.m. the morning of this procedure, it was associated  with chills and rigors the evening prior to this.  She presented to [facility] emergency department with an exam consistent with  peritonitis.  She had a white count of 15.8 and a lactate of 3.9.  She also  had a CAT scan that showed jejunitis, possible bowel ischemia.       DESCRIPTION OF PROCEDURE:  After informed consent was obtained in which the risks and benefits of the  procedure were explained to the patient and her family including possible  bowel resection, bleeding, infection, risk of anesthesia, injury to  intraabdominal structures, possible ostomy the patient and her family  wished to proceed to the operating room for exploratory laparotomy.     The patient was taken to the operating room.  She had received antibiotics  in the emergency department.  She was placed supine on the operative table.   SCDs were placed for DVT prophylaxis.  The patient was intubated with oral  tracheal intubation by anesthesia without difficulty.  The abdomen was  prepped and draped in the usual sterile fashion.  After a surgical pause  was undertaken in which all parties were in agreement that this is the  correct patient, correct procedure, an incision was made from the  epigastric region down to the infraumbilical area in the midline, was  carried down to the level of the fascia.  The fascia was opened and then  the peritoneum was entered.  Upon entering the abdomen, we found a large  amount of pus present.  The pus did not appear to be coming from any  particular region of the abdomen, so we began  systematically inspecting the  abdomen.  First we identified the ligament of Treitz and we ran the small  bowel from the ligament of Treitz to the cecum.  In the mid jejunum, there  was an area approximately of 30 cm of thickened and indurated small bowel.   This was inspected carefully.  It had palpable pulses.  The tissue was  pink, it did not look grossly ischemic or necrotic, but did look inflammed.  It was also  peristalsing.  We continued to run the small bowel and the remainder of the  small bowel appeared to be normal.       We then turned our attention to the colon.  We carefully inspected the  cecum and the appendix.  The appendix appeared normal.  There were no  abnormalities noted in the cecum, ascending, transverse, descending or  sigmoid colon.  We inspected the rectum and saw no abnormalities.  We then  inspected the uterus and both ovaries and fallopian tubes, they were all  normal.  We then turned our attention to the upper abdomen.  The  gallbladder was slightly thickened, but overall normal.  The stomach also  appeared to be normal.  We then turned our attention back to the area of  thickened jejunum.  The area was palpated.  It was found to be once again,  although thickened, there were no masses or lesions  visible.  There were no  areas of perforation and the area continued to have palpable pulses.  We  then washed the pus out of her abdomen using copious amounts of irrigation  and carefully inspected the small bowel to ensure that it lied correctly in  the abdomen and there was no malrotation of which there was none.  We then  carefully placed the small bowel back in the abdominal cavity, ensuring  that it was not twisted.  We then covered the small bowel with the omentum  and began to close the abdomen using a #1 looped PDS, 1 from the cephalic  side and 1 from the caudal side.  We then closed the skin midline incision  with staples, covered by 4 x 4's, and coverall.  At the end of  the  procedure, all instrument and sponge counts were correct.  Dr. Doristine Locks was  present and scrubbed for the entire procedure.     SPECIMENS:  None.     ESTIMATED BLOOD LOSS:  10 mL.     DRAINS AND PACKING:  None.     COMPLICATIONS:  None.     CONDITION:  Stable, extubated, transferred to PACU.

## 2011-01-25 NOTE — ED Notes (Signed)
Po contrast finished @ 1545. Ct aware.

## 2011-01-25 NOTE — H&P (Signed)
TRAUMA HISTORY AND PHYSICAL    Date Time: 01/25/2011 7:47 PM  Patient Name: Tara Perry  Attending Physician: Lacretia Nicks, MD  Primary Care Physician: Kennon Holter, MD, MD    Date of Admission:   01/25/2011  19:47 PM      Assessment/Plan:   The patient has the following active problems:  Patient Active Problem List   Diagnoses   . Peritonitis   . Viral cardiomyopathy   . Type II diabetes mellitus without mention of complication  Hyperglycemia  Leukocytosis  Acidosis       Plan by systems:  Neuro: pain control  Pulm: no acute issues  CV: EKG, will contact cardiology post op  Endo: SSI  GI: peritonitis, to OR for ex-lap  Heme/ID: type and screen Heparin proph for OR  Renal: fluid bolus, place foley  Neuromuscular: pain meds for knee pain  Psych:  No issues  Wounds:  None     Patient will be admitted to: OR  Patient with 12 hour h/o abdominal pain who now has peritonitis.  She has leukocytosis and acidosis.  She has a CT scan Abdomen and Pelvis which showed dilated loops of small bowel.  With concern for ischemia have explained to the patient that she needs an exploration in the operating room.  The riska and benefits of surgery were explained and the patient has consented to surgery.    Consulting Services:   None    Patient Complaint:   Tara Perry is a 65 y.o. female who presents to the hospital with Abdominal Pain.   Patient complains of pain non-localized for the last 12 hours. Symptoms have been gradually worsening. The pain is described as bloated, cramping and pressure-like, is  severe, and does not radiate.  Associated symptoms include anorexia, chills and vomiting.  Aggravating factors include exam.  Alleviating factors include pain meds.   d    The medications, past medical/surgical history, family history, allergies & full review of systems were:  Reviewed    Allergies:     Allergies   Allergen Reactions   . Shellfish-Derived Products Anaphylaxis       Medication:     Zosyn in ED    Past Medical  History:     Past Medical History   Diagnosis Date   . Diabetes mellitus 1992   . 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 Colonoscopy 2002     normal       Family History:     Family History   Problem Relation Age of Onset   . Cancer Father 32     bladder       Social History:     History     Social History   . Marital Status: Widowed     Spouse Name: N/A     Number of Children: N/A   . Years of Education: N/A     Social History Main Topics   . Smoking status: Never Smoker    . Smokeless tobacco: Never Used   . Alcohol Use: Yes      social   . Drug Use: No   . Sexually Active: Not on file     Other Topics Concern   . Not on file     Social History Narrative   . No narrative on  file       Review of Systems:   Review of Systems   Constitutional: Positive for chills. Negative for fever, weight loss and diaphoresis.   HENT: Negative.    Respiratory: Negative.    Cardiovascular: Negative.    Gastrointestinal: Positive for vomiting and abdominal pain.   Genitourinary:        Decreased UOP today   Musculoskeletal: Positive for joint pain (L knee).   Neurological: Negative.    Endo/Heme/Allergies: Negative.        Physical Exam:   Physical Exam   Constitutional: She is oriented to person, place, and time and well-developed, well-nourished, and in no distress.   HENT:   Head: Normocephalic and atraumatic.   Mouth/Throat: Oropharynx is clear and moist.   Eyes: Pupils are equal, round, and reactive to light.   Neck: Neck supple. No thyromegaly present.   Cardiovascular: Normal rate, regular rhythm and intact distal pulses.    Murmur heard.  Pulmonary/Chest: Effort normal and breath sounds normal. No stridor. No respiratory distress. She has no wheezes. She has no rales.   Abdominal: She exhibits distension. There is tenderness (diffuse). There is guarding.        peritonitis   Musculoskeletal: She exhibits tenderness (L knee).    Lymphadenopathy:     She has no cervical adenopathy.   Neurological: She is alert and oriented to person, place, and time.   Skin: Skin is warm and dry.       Filed Vitals:    01/25/11 1930   BP: 122/57   Pulse: 102   Temp: 100.6 F (38.1 C)   Resp: 20       Vent Settings:                Labs:     Results     Procedure Component Value Units Date/Time    Hepatic Function Panel (LFTs) [[id number]] Collected:01/25/11 1452    Specimen Information:Blood Updated:01/25/11 1647     Bilirubin, Total 0.9 mg/dL      Bilirubin, Direct 0.2 mg/dL      Bilirubin, Indirect 0.7 mg/dL      AST (SGOT) 27 U/L      ALT 26 U/L      Alkaline Phosphatase 67 U/L      Total Protein 6.7 g/dL      Albumin 4.0 g/dL      Globulin 2.7 g/dL      Albumin/Globulin Ratio 1.5     Lipase [[id number]] Collected:01/25/11 1452    Specimen Information:Blood Updated:01/25/11 1647     Lipase 81 U/L     UA with Reflex Micro [[id number]]  (Abnormal) Collected:01/25/11 1619    Specimen Information:Urine Updated:01/25/11 1647     Urine Type Clean Catch      Color, UA Yellow      Clarity, UA Clear      Specific Gravity, UR 1.021      Urine pH 5.5      Leukocytes, UA Negative      Nitrite, UR Negative      Protein, UR 30 (A)      Urine Glucose Trace (A)      Ketones, UR 10      Urobilinogen, UA Normal mg/dL      Bilirubin, UR Negative      Urine Blood Trace (A)      RBC, UA 0 - 5 /HPF      WBC, UA 0 - 5 /HPF  Squamous Epithelial Cells, Urine 0 - 5 /HPF      Hyaline Casts, UA 11 - 25 (A) /LPF     i-Stat CG4 Venous CartrIDge [[id number]]  (Abnormal) Collected:01/25/11 1630     i-STAT pH Venous 7.365 Updated:01/25/11 1646     i-STAT pCO2 Venous 37.3 mmHg      i-STAT pO2 Venous 24.0 mmHg      i-STAT HCO3 Bicarbonate Venous 21.4 mEq/L      i-STAT Total CO2 Venous 23.0 mEq/L      i-STAT Base Excess Venous -4.0 mEq/L      i-STAT O2 Saturation Venous 42.0 %      i-STAT Lactic Acid 3.9 (H) mEq/L      i-STAT Patient Temperature 97.7      i-STAT FIO2 21      i-STAT O2  Delivery Room Air      i-STAT Allen's Test NA      i-STAT ECMO No      i-STAT Draw Site Venous     CBC with Differential [[id number]]  (Abnormal) Collected:01/25/11 1453    Specimen Information:Blood Updated:01/25/11 1551     White Blood Cells 15.80 (H) x10 3/uL      RBC 4.62 x10 6/uL      Hgb 13.3 g/dL      Hematocrit 69.6 %      MCV 85.3 fL      MCH 28.8 pg      MCHC 33.8 g/dL      RDW 13 %      Platelets 226 x10 3/uL      MPV 9.8 fL     Manual Differential [[id number]]  (Abnormal) Collected:01/25/11 1453     Segmented Neutrophils - 38 (L) % Updated:01/25/11 1551     Band Neutrophils 49 (H) %      Lymphocytes Manual 2 (L) %      Monocytes Manual 5 %      Metamyelocytes 6 (H) %      Nucleated RBC 0 /100 WBC      Abs Seg Manual 5.97 x10 3/uL      Absolute Band Neutrophils 7.68 (H) x10 3/uL      Absolute Lymph Manual 0.28 (L) x10 3/uL      Monocytes Absolute 0.85 x10 3/uL      Metamyelocytes Absolute 1.00 (H) x10 3/uL     Cell MorpHology [[id number]] Collected:01/25/11 1453     Cell Morphology: Normal Updated:01/25/11 1551     Platelet Estimate Normal     Basic Metabolic Panel (BMP) [[id number]]  (Abnormal) Collected:01/25/11 1453    Specimen Information:Blood Updated:01/25/11 1512     Glucose 269 (H) mg/dL      BUN 29 (H) mg/dL      Creatinine 1.2 mg/dL      Calcium 8.9 mg/dL      Sodium 474 (L) mEq/L      Potassium 4.2 mEq/L      Chloride 98 mEq/L      CO2 18 (L) mEq/L     GFR [[id number]] Collected:01/25/11 1453     GFR Caucasian 45   Updated:01/25/11 1512          Rads:   Radiological Procedure reviewed.      CT ABDOMEN PELVIS WO IV/ W PO CONT      Attending Attestation     I have reviewed the notes, assessments, and/or procedures performed by the resident/NP/PA.  I am in agreement with their plan upon my signature as an Attending.  Total Critical Care time minus procedures and teaching is No Critical Care Time        Signed by: Madlyn Frankel Key  01/25/2011 7:47 PM

## 2011-01-26 LAB — RED BLOOD CELLS OR HOLD

## 2011-01-26 LAB — CBC AND DIFFERENTIAL
Basophils Absolute Automated: 0.01 10*3/uL (ref 0.00–0.20)
Basophils Automated: 0 % (ref 0–2)
Eosinophils Absolute Automated: 0 10*3/uL (ref 0.00–0.70)
Eosinophils Automated: 0 % (ref 0–5)
Hematocrit: 31.9 % — ABNORMAL LOW (ref 37.0–47.0)
Hgb: 11 g/dL — ABNORMAL LOW (ref 12.0–16.0)
Immature Granulocytes Absolute: 0.09 10*3/uL — ABNORMAL HIGH
Immature Granulocytes: 1 % (ref 0–1)
Lymphocytes Absolute Automated: 0.73 10*3/uL (ref 0.50–4.40)
Lymphocytes Automated: 6 % — ABNORMAL LOW (ref 15–41)
MCH: 29.3 pg (ref 28.0–32.0)
MCHC: 34.5 g/dL (ref 32.0–36.0)
MCV: 84.8 fL (ref 80.0–100.0)
MPV: 9.9 fL (ref 9.4–12.3)
Monocytes Absolute Automated: 0.37 10*3/uL (ref 0.00–1.20)
Monocytes: 3 % (ref 0–11)
Neutrophils Absolute: 11.3 10*3/uL — ABNORMAL HIGH (ref 1.80–8.10)
Neutrophils: 90 % — ABNORMAL HIGH (ref 52–75)
Nucleated RBC: 0 /100 WBC
Platelets: 188 10*3/uL (ref 140–400)
RBC: 3.76 10*6/uL — ABNORMAL LOW (ref 4.20–5.40)
RDW: 13 % (ref 12–15)
WBC: 12.5 10*3/uL — ABNORMAL HIGH (ref 3.50–10.80)

## 2011-01-26 LAB — POCT GLUCOSE
Whole Blood Glucose POCT: 273 mg/dL — AB (ref 70–100)
Whole Blood Glucose POCT: 270 mg/dL — AB (ref 70–100)
Whole Blood Glucose POCT: 270 mg/dL — AB (ref 70–100)

## 2011-01-26 LAB — ECG 12-LEAD
Atrial Rate: 98 {beats}/min
P Axis: 80 degrees
P-R Interval: 158 ms
Q-T Interval: 410 ms
QRS Duration: 150 ms
QTC Calculation (Bezet): 523 ms
R Axis: -58 degrees
T Axis: 64 degrees
Ventricular Rate: 98 {beats}/min

## 2011-01-26 LAB — BASIC METABOLIC PANEL
BUN: 28 mg/dL — ABNORMAL HIGH (ref 8–20)
CO2: 20 mEq/L — ABNORMAL LOW (ref 21–30)
Calcium: 7.3 mg/dL — ABNORMAL LOW (ref 8.6–10.2)
Chloride: 102 mEq/L (ref 98–107)
Creatinine: 1 mg/dL (ref 0.6–1.5)
Glucose: 274 mg/dL — ABNORMAL HIGH (ref 70–100)
Potassium: 4.2 mEq/L (ref 3.6–5.0)
Sodium: 132 mEq/L — ABNORMAL LOW (ref 136–146)

## 2011-01-26 LAB — GFR: EGFR: 55

## 2011-01-26 MED ORDER — GLIMEPIRIDE 2 MG PO TABS
2.00 mg | ORAL_TABLET | Freq: Every evening | ORAL | Status: DC
Start: 2011-01-26 — End: 2011-01-26
  Filled 2011-01-26: qty 1

## 2011-01-26 MED ORDER — SODIUM CHLORIDE 0.9 % IV SOLN
INTRAVENOUS | Status: DC
Start: 2011-01-26 — End: 2011-01-28

## 2011-01-26 MED ORDER — NALOXONE HCL 0.4 MG/ML IJ SOLN
0.2000 mg | INTRAMUSCULAR | Status: DC | PRN
Start: 2011-01-26 — End: 2011-01-28

## 2011-01-26 MED ORDER — LISINOPRIL 10 MG PO TABS
20.00 mg | ORAL_TABLET | Freq: Every day | ORAL | Status: DC
Start: 2011-01-26 — End: 2011-01-28
  Administered 2011-01-26 – 2011-01-28 (×3): 20 mg via ORAL
  Filled 2011-01-26 (×3): qty 2

## 2011-01-26 MED ORDER — INSULIN REGULAR HUMAN 100 UNIT/ML IJ SOLN
1.00 [IU] | INTRAMUSCULAR | Status: DC | PRN
Start: 2011-01-26 — End: 2011-01-26
  Administered 2011-01-26: 3 [IU] via SUBCUTANEOUS
  Administered 2011-01-26: 2 [IU] via SUBCUTANEOUS
  Filled 2011-01-26: qty 0.05

## 2011-01-26 MED ORDER — SITAGLIPTIN PHOSPHATE 100 MG PO TABS
100.00 mg | ORAL_TABLET | Freq: Every day | ORAL | Status: DC
Start: 2011-01-26 — End: 2011-01-28
  Administered 2011-01-26 – 2011-01-28 (×3): 100 mg via ORAL
  Filled 2011-01-26 (×3): qty 1

## 2011-01-26 MED ORDER — HEPARIN SODIUM (PORCINE) 5000 UNIT/ML IJ SOLN
5000.0000 [IU] | Freq: Three times a day (TID) | INTRAMUSCULAR | Status: DC
Start: 2011-01-26 — End: 2011-01-26

## 2011-01-26 MED ORDER — METFORMIN HCL 500 MG PO TABS
1000.00 mg | ORAL_TABLET | Freq: Two times a day (BID) | ORAL | Status: DC
Start: 2011-01-26 — End: 2011-01-28
  Administered 2011-01-26 – 2011-01-28 (×4): 1000 mg via ORAL
  Filled 2011-01-26 (×4): qty 2

## 2011-01-26 MED ORDER — ATORVASTATIN CALCIUM 10 MG PO TABS
10.00 mg | ORAL_TABLET | ORAL | Status: DC
Start: 2011-01-26 — End: 2011-01-28
  Administered 2011-01-26 – 2011-01-27 (×2): 10 mg via ORAL
  Filled 2011-01-26 (×2): qty 1

## 2011-01-26 MED ORDER — HEPARIN SODIUM (PORCINE) 5000 UNIT/ML IJ SOLN
5000.0000 [IU] | Freq: Three times a day (TID) | INTRAMUSCULAR | Status: DC
Start: 2011-01-26 — End: 2011-01-28
  Administered 2011-01-26 – 2011-01-28 (×7): 5000 [IU] via SUBCUTANEOUS
  Filled 2011-01-26 (×7): qty 1

## 2011-01-26 MED ORDER — GLUCOSE 40 % PO GEL
15.00 g | ORAL | Status: DC | PRN
Start: 2011-01-26 — End: 2011-01-28

## 2011-01-26 MED ORDER — SODIUM CHLORIDE 0.9 % IV MBP
4.50 g | Freq: Three times a day (TID) | INTRAVENOUS | Status: DC
Start: 2011-01-26 — End: 2011-01-26
  Administered 2011-01-26: 4.5 g via INTRAVENOUS
  Filled 2011-01-26 (×2): qty 4.5

## 2011-01-26 MED ORDER — INSULIN ASPART 100 UNIT/ML SC SOLN
2.00 [IU] | Freq: Once | SUBCUTANEOUS | Status: AC
Start: 2011-01-26 — End: 2011-01-26
  Administered 2011-01-26: 2 [IU] via SUBCUTANEOUS
  Filled 2011-01-26: qty 2

## 2011-01-26 MED ORDER — CARVEDILOL 6.25 MG PO TABS
25.00 mg | ORAL_TABLET | Freq: Two times a day (BID) | ORAL | Status: DC
Start: 2011-01-26 — End: 2011-01-28
  Administered 2011-01-26 – 2011-01-28 (×4): 25 mg via ORAL
  Filled 2011-01-26: qty 4
  Filled 2011-01-26: qty 2
  Filled 2011-01-26 (×2): qty 4

## 2011-01-26 MED ORDER — SODIUM CHLORIDE 0.9 % IV MBP
4.50 g | Freq: Four times a day (QID) | INTRAVENOUS | Status: DC
Start: 2011-01-26 — End: 2011-01-28
  Administered 2011-01-26 – 2011-01-28 (×8): 4.5 g via INTRAVENOUS
  Filled 2011-01-26 (×9): qty 4.5

## 2011-01-26 MED ORDER — DEXTROSE 50 % IV SOLN
25.00 mL | INTRAVENOUS | Status: DC | PRN
Start: 2011-01-26 — End: 2011-01-28

## 2011-01-26 MED ORDER — LACTATED RINGERS IV SOLN
INTRAVENOUS | Status: DC
Start: 2011-01-25 — End: 2011-01-27

## 2011-01-26 MED ORDER — GLUCAGON HCL (RDNA) 1 MG IJ SOLR
1.00 mg | INTRAMUSCULAR | Status: DC | PRN
Start: 2011-01-26 — End: 2011-01-28

## 2011-01-26 NOTE — Anesthesia Postprocedure Evaluation (Signed)
Anesthesia Post Evaluation    [name]    Procedures performed: Procedure(s) with comments:  LAPAROTOMY, ABDOMINAL EXPLORATION - WITH WASHOUT.    Anesthesia type: General ETT    Patient location:Phase I PACU    Last vitals:   Filed Vitals:    01/25/11 2322   BP: 131/59   Pulse: 105   Temp: 99.1 F (37.3 C)   Resp: 22       Post pain: Patient not complaining of pain, continue current therapy     Mental Status:sedated    Respiratory Function: toleratinig nasal cannula    Cardiovascular: stable    Nausea/Vomiting: patient not complaining of nausea or vomiting    Hydration Status: adequate    Regional Anesthesia: none    Post assessment: no apparent anesthetic complications

## 2011-01-26 NOTE — Progress Notes (Signed)
Pt admitted to T7E from PACU s/p ex-lap with abdominal washout.  Incision was clean, dry, intact.  VSS.  Pt on PCA dilaudid at 0.3/10/6 with minimal use.  Pt states pain at 0-1/10 while lying in bed but pain increases with movement.  While ambulating in halls pt stated pain was a 4/10.  BS are hypoactive and pt is not yet passing gas.  Will continue to assess incision/dressing.  Will monitor pt for changes in GI function.

## 2011-01-26 NOTE — Progress Notes (Signed)
ACUTE CARE SURGERY / TRAUMA DAILY PROGRESS NOTE    Date/Time: 01/26/2011 6:30 AM  Patient Name: Tara Perry  Primary Care Physician: Kennon Holter, MD, MD  Hospital Day: 1  Post-op Day: 0 (ex lap)    Assessment/Plan:     The patient has the following active problems:  Patient Active Problem List   Diagnoses   . Peritonitis   . Viral cardiomyopathy   . Type II diabetes mellitus without mention of complication       Plan by systems:  Neuro: continue pain control  Pulm: encourage IS  CV: HD acceptable  GI: consider sips or clear liquid diet  Heme/ID: SCDs for DVT prophylaxis  Renal: Adequate UOP   Wounds: remove dressings 11/16    ATTENDING:        None    Interval History:   Tara Perry is a 65 y.o. female who presents to the hospital with peritonitis of uncertain etiology.  Pt states pain much improved since surgery. No nausea, vomiting. No dyspnea.    Allergies:     Allergies   Allergen Reactions   . Shellfish-Derived Products Anaphylaxis       Medications:     Current Facility-Administered Medications   Medication Dose Route Frequency Provider Last Rate Last Dose   . 0.9%  NaCl infusion   Intravenous Continuous Bernette Mayers, MD 100 mL/hr at 01/26/11 0345     . acetaminophen (TYLENOL) tablet 650 mg  650 mg Oral Q6H PRN Leanne Chang, MD       . diphenhydrAMINE (BENADRYL) injection 12.5 mg  12.5 mg Intravenous Q6H PRN Leanne Chang, MD       . HYDROmorphone (DILAUDID) 0.2 mg/mL in sodium chloride 0.9% 100 mL PCA   Intravenous Continuous Leanne Chang, MD       . insulin regular (HumuLIN,NovoLIN) injection 1-5 Units  1-5 Units Subcutaneous PRN Bernette Mayers, MD   2 Units at 01/26/11 [phone]   . lactated ringers infusion   Intravenous Continuous Leanne Chang, MD       . morphine injection 4 mg  4 mg Intravenous Once Eduardo Osier, MD   4 mg at 01/25/11 1559   . naloxone [facility]) injection 0.1 mg  0.1 mg Intravenous PRN Leanne Chang, MD       . ondansetron [facility]) injection  4 mg  4 mg Intravenous Once Eduardo Osier, MD   4 mg at 01/25/11 1558   . ondansetron (ZOFRAN) injection 4 mg  4 mg Intravenous Once PRN Leanne Chang, MD       . piperacillin-tazobactam (ZOSYN) 4-0.5 G IVPB        4,500 mg at 01/25/11 1809   . piperacillin-tazobactam (ZOSYN) 4.5 g in sodium chloride 0.9 % 100 mL IVPB mini-bag plus  4.5 g Intravenous Once Mohammed A Almulhim, MD       . piperacillin-tazobactam (ZOSYN) 4.5 g in sodium chloride 0.9 % 100 mL IVPB mini-bag plus  4.5 g Intravenous [facility] Bernette Mayers, MD 200 mL/hr at 01/26/11 0507 4.5 g at 01/26/11 0507   . promethazine (PHENERGAN) injection 6.25 mg  6.25 mg Intravenous Once PRN Leanne Chang, MD       . sodium chloride (PF) 0.9 % flush 3 mL  3 mL Intravenous Q8H Leanne Chang, MD       . sodium chloride 0.9 % bolus 1,000 mL  1,000 mL Intravenous Once Lance Sell, MD  1,000 mL/hr at 01/25/11 1641 1,000 mL at 01/25/11 1641   . sodium chloride 0.9 % bolus 1,000 mL  1,000 mL Intravenous Once [name], MD 1,000 mL/hr at 01/25/11 1928 1,000 mL at 01/25/11 1928   . DISCONTD: sodium chloride 0.9 % irrigation    PRN Tomma Rakers, MD   3,000 mL at 01/25/11 2217     Facility-Administered Medications Ordered in Other Encounters   Medication Dose Route Frequency Provider Last Rate Last Dose   . DISCONTD: 0.9%  NaCl infusion    Continuous PRN Philippa Chester, CRNA       . DISCONTD: dexamethasone (DECADRON) injection    PRN Drusilla Kanner, CRNA   10 mg at 01/25/11 2223   . DISCONTD: etomidate (AMIDATE) injection    PRN Philippa Chester, CRNA   20 mg at 01/25/11 2137   . DISCONTD: famotidine (PEPCID) injection    PRN Drusilla Kanner, CRNA   20 mg at 01/25/11 2223   . DISCONTD: fentaNYL (SUBLIMAZE) injection    PRN Philippa Chester, CRNA   50 mcg at 01/25/11 2155   . DISCONTD: glycopyrrolate (ROBINUL) injection    PRN Drusilla Kanner, CRNA   0.7 mg at 01/25/11 2255   . DISCONTD: lactated ringers infusion     Continuous PRN Philippa Chester, CRNA       . DISCONTD: lactated ringers infusion    Continuous PRN Philippa Chester, CRNA       . DISCONTD: lidocaine (XYLOCAINE) 2 % injection    PRN Philippa Chester, CRNA   50 mg at 01/25/11 2137   . DISCONTD: metoprolol (LOPRESSOR) injection    PRN Philippa Chester, CRNA   1 mg at 01/25/11 2158   . DISCONTD: midazolam (VERSED) injection    PRN Philippa Chester, CRNA   1 mg at 01/25/11 2135   . DISCONTD: neostigmine (PROSTIGMINE) injection    PRN Myra Leighton Parody, CRNA   5 mg at 01/25/11 2255   . DISCONTD: ondansetron (ZOFRAN) injection    PRN Drusilla Kanner, CRNA   4 mg at 01/25/11 2229   . DISCONTD: rocuronium (ZEMURON) injection    PRN Philippa Chester, CRNA   40 mg at 01/25/11 2147   . DISCONTD: succinylcholine (ANECTINE) injection    PRN Philippa Chester, CRNA   120 mg at 01/25/11 2138       Labs:     Results     Procedure Component Value Units Date/Time    POCT glucose (Q6H) [[id number]]  (Abnormal) Collected:01/26/11 0308    Specimen Information:Blood Updated:01/26/11 0309     POCT Glucose WB 273 (A) mg/dL     RBC OR HOLD Blood Product [[id number]] Collected:01/25/11 1927     RBC Leukoreduced [id number]    selected Updated:01/25/11 2113     RBC Leukoreduced [id number]    selected     Narrative:    ?Hold 2 units  ?Surgery/ Procedure (to be Performed)->jeujenectomy  ?Surgery/Procedure Date:->01/25/11  ?Is the patient pregnant?->No  ?Has the patient been transfused w/i the last 3 months?->No    Type and Screen [[id number]] Collected:01/25/11 1927    Specimen Information:Blood Updated:01/25/11 2031     ABORH B POS      Antibody Screen Gel NEG     Narrative:    ?Hold 2 units  ?Surgery/ Procedure (to be Performed)->jeujenectomy  ?Surgery/Procedure Date:->01/25/11  ?Is the patient pregnant?->No  ?Has the patient been transfused w/i the last 3  months?->No    Blood Culture #1 [[id number]] Collected:01/25/11 1819    Specimen Information:Blood / Blood Updated:01/25/11 2010    Narrative:     Site?->peripheral    Blood Culture #2 [[id number]] Collected:01/25/11 1819    Specimen Information:Blood / Blood Updated:01/25/11 2010    Narrative:    Site?->peripheral    Hepatic Function Panel (LFTs) [[id number]] Collected:01/25/11 1452    Specimen Information:Blood Updated:01/25/11 1647     Bilirubin, Total 0.9 mg/dL      Bilirubin, Direct 0.2 mg/dL      Bilirubin, Indirect 0.7 mg/dL      AST (SGOT) 27 U/L      ALT 26 U/L      Alkaline Phosphatase 67 U/L      Total Protein 6.7 g/dL      Albumin 4.0 g/dL      Globulin 2.7 g/dL      Albumin/Globulin Ratio 1.5     Lipase [[id number]] Collected:01/25/11 1452    Specimen Information:Blood Updated:01/25/11 1647     Lipase 81 U/L     UA with Reflex Micro [[id number]]  (Abnormal) Collected:01/25/11 1619    Specimen Information:Urine Updated:01/25/11 1647     Urine Type Clean Catch      Color, UA Yellow      Clarity, UA Clear      Specific Gravity, UR 1.021      Urine pH 5.5      Leukocytes, UA Negative      Nitrite, UR Negative      Protein, UR 30 (A)      Urine Glucose Trace (A)      Ketones, UR 10      Urobilinogen, UA Normal mg/dL      Bilirubin, UR Negative      Urine Blood Trace (A)      RBC, UA 0 - 5 /HPF      WBC, UA 0 - 5 /HPF      Squamous Epithelial Cells, Urine 0 - 5 /HPF      Hyaline Casts, UA 11 - 25 (A) /LPF     i-Stat CG4 Venous CartrIDge [[id number]]  (Abnormal) Collected:01/25/11 1630     i-STAT pH Venous 7.365 Updated:01/25/11 1646     i-STAT pCO2 Venous 37.3 mmHg      i-STAT pO2 Venous 24.0 mmHg      i-STAT HCO3 Bicarbonate Venous 21.4 mEq/L      i-STAT Total CO2 Venous 23.0 mEq/L      i-STAT Base Excess Venous -4.0 mEq/L      i-STAT O2 Saturation Venous 42.0 %      i-STAT Lactic Acid 3.9 (H) mEq/L      i-STAT Patient Temperature 97.7      i-STAT FIO2 21      i-STAT O2 Delivery Room Air      i-STAT Allen's Test NA      i-STAT ECMO No      i-STAT Draw Site Venous     CBC with Differential [[id number]]  (Abnormal) Collected:01/25/11 1453    Specimen  Information:Blood Updated:01/25/11 1551     White Blood Cells 15.80 (H) x10 3/uL      RBC 4.62 x10 6/uL      Hgb 13.3 g/dL      Hematocrit 81.8 %      MCV 85.3 fL      MCH 28.8 pg      MCHC 33.8 g/dL      RDW 13 %      Platelets 226  x10 3/uL      MPV 9.8 fL     Manual Differential [[id number]]  (Abnormal) Collected:01/25/11 1453     Segmented Neutrophils - 38 (L) % Updated:01/25/11 1551     Band Neutrophils 49 (H) %      Lymphocytes Manual 2 (L) %      Monocytes Manual 5 %      Metamyelocytes 6 (H) %      Nucleated RBC 0 /100 WBC      Abs Seg Manual 5.97 x10 3/uL      Absolute Band Neutrophils 7.68 (H) x10 3/uL      Absolute Lymph Manual 0.28 (L) x10 3/uL      Monocytes Absolute 0.85 x10 3/uL      Metamyelocytes Absolute 1.00 (H) x10 3/uL     Cell MorpHology [[id number]] Collected:01/25/11 1453     Cell Morphology: Normal Updated:01/25/11 1551     Platelet Estimate Normal     Basic Metabolic Panel (BMP) [[id number]]  (Abnormal) Collected:01/25/11 1453    Specimen Information:Blood Updated:01/25/11 1512     Glucose 269 (H) mg/dL      BUN 29 (H) mg/dL      Creatinine 1.2 mg/dL      Calcium 8.9 mg/dL      Sodium 962 (L) mEq/L      Potassium 4.2 mEq/L      Chloride 98 mEq/L      CO2 18 (L) mEq/L     GFR [[id number]] Collected:01/25/11 1453     GFR Caucasian 45   Updated:01/25/11 1512          Rads:   Radiological Procedure reviewed.    Radiology Results (24 Hour)     Procedure Component Value Units Date/Time    CT Abd/Pelvis with PO Contrast [[id number]] Collected:01/25/11 1736    Order Status:Completed  Updated:01/25/11 1830    Addenda:        ADDENDUM:     Jejunitis can be caused by bowel ischemia. This was discussed with Dr.  Mal Amabile immediately after the exam.  Signed:01/25/11 1829 by Bosie Helper, MD    Narrative:    HISTORY: Suprapubic abdominal pain.     TECHNIQUE: CT abdomen/pelvis with oral contrast only.     COMPARISON: None.     FINDINGS:      There is marked thickening of the proximal to mid jejunum with  adjacent  inflammation but no obstruction, free fluid, free air, abscess or  lymphadenopathy. The appendix is normal. Sigmoid diverticulosis is  present with no diverticulitis.     Noncontrast images of the liver, spleen, pancreas, kidneys and adrenal  glands are normal.      Limited views of the lung bases demonstrate bibasilar atelectasis.       Impression:    Marked jejunitis.          Physical Exam:   Tmax:  Temp (24hrs), Avg:99.3 F (37.4 C), Min:98.5 F (36.9 C), Max:101 F (38.3 C)      Vital Signs:  Filed Vitals:    01/26/11 0500   BP: 117/53   Pulse: 92   Temp:    Resp: 19       I/O:  Intake and Output Summary (Last 24 hours) at Date Time    Intake/Output Summary (Last 24 hours) at 01/26/11 0630  Last data filed at 01/26/11 0500   Gross per 24 hour   Intake   1325 ml   Output    325 ml   Net  1000 ml       IVF:  Intravenous fluids were administered, Normal Saline 100 ml/hr.    Nutrition: NPO    Lines/Drains/Airways:    Urethral Catheter Double-lumen 18 Fr. (Active)   Line necessity reviewed? Yes 01/25/2011  9:58 PM   Site Assessment Intact 01/25/2011 11:20 PM   Collection Container Standard drainage bag 01/25/2011 11:20 PM   Reason for Continuing Urinary Catheterization past POD 1 Strict I&O (incontinent patient only) 01/25/2011 11:20 PM   Number of days:1           Peripheral IV 01/25/11 Left;Anterior Antecubital (Active)   Site Assessment Leaking 01/26/2011  3:00 AM   Line Status Leaking 01/26/2011  3:00 AM   Tubing Dated? Yes 01/25/2011 11:20 PM   Dressing Status Clean;Dry;Intact 01/25/2011 11:20 PM   Number of days:1                                                   ROS:  Review of Systems   Constitutional: Negative for fever and chills.   Respiratory: Negative for shortness of breath and wheezing.    Cardiovascular: Negative for chest pain and palpitations.   Gastrointestinal: Negative for nausea and vomiting. Abdominal pain: improved after surgery.   Musculoskeletal: Positive for back pain.    Neurological: Negative for dizziness and headaches.       Physical Exam:  Physical Exam   Constitutional: She is oriented to person, place, and time. She appears well-developed and well-nourished. No distress.   HENT:   Head: Normocephalic and atraumatic.   Eyes: Pupils are equal, round, and reactive to light.   Neck: Normal range of motion. Neck supple.   Cardiovascular: Regular rhythm and normal heart sounds.  Exam reveals no gallop and no friction rub.    No murmur heard.  Pulmonary/Chest: Effort normal and breath sounds normal. No respiratory distress. She has no wheezes. She has no rales.   Abdominal: Soft. Bowel sounds are normal. She exhibits no distension. There is tenderness (LUQ, LLQ). There is no rebound and no guarding.   Musculoskeletal: Normal range of motion. She exhibits no edema.   Neurological: She is alert and oriented to person, place, and time.   Skin: Skin is warm and dry.       Janine Limbo. Betsey Holiday, MD  Resident, General Surgery  [phone]        Attestation:   I have reviewed the notes, assessments, and/or procedures performed by the resident/NP/PA.  I am in agreement with their plan upon my signature as an Attending.    01/26/2011 6:30 AM

## 2011-01-26 NOTE — Progress Notes (Addendum)
S: Patient has no complaints    O:   Filed Vitals:    01/26/11 0200   BP:    Pulse:    Temp: 98.7 F (37.1 C)   Resp:    /BP 114/48 HR 93 Resp 24  Gen: NAD  CV: RRR without m, r, g  CTA bilat  Abdomen soft, nontender, non distended, hypoactive bowel sounds  UOP 300 cc amber urine    A/P Pt is a 65 y.o. female s/p diagnostic laparotomy  1. Pain control  2. I/S  3. F/u culture results  4. Continue antibiotics  5. ARBF    2:56 AM 01/26/2011  Came to check pt.  C/o abd pain.    Filed Vitals:    01/26/11 0230   BP: 115/56   Pulse: 88   Temp:    Resp: 16     NAD  RRR  CTA B  Abd:  Mild tenderness diffuse  Doing well s/p ex-lap  Continue abx, pain control.  Arlana Lindau, MD  General Surgery Resident PGY3  [phone]

## 2011-01-27 ENCOUNTER — Inpatient Hospital Stay: Payer: Medicare Other

## 2011-01-27 LAB — POCT GLUCOSE
Whole Blood Glucose POCT: 237 mg/dL — AB (ref 70–100)
Whole Blood Glucose POCT: 240 mg/dL — AB (ref 70–100)
Whole Blood Glucose POCT: 258 mg/dL — AB (ref 70–100)

## 2011-01-27 MED ORDER — HYDROMORPHONE HCL 2 MG PO TABS
2.00 mg | ORAL_TABLET | ORAL | Status: DC | PRN
Start: 2011-01-27 — End: 2011-01-28
  Administered 2011-01-27 – 2011-01-28 (×3): 2 mg via ORAL
  Filled 2011-01-27 (×3): qty 1

## 2011-01-27 MED ORDER — HYDROMORPHONE HCL 4 MG PO TABS
4.00 mg | ORAL_TABLET | ORAL | Status: DC | PRN
Start: 2011-01-27 — End: 2011-01-28
  Administered 2011-01-28: 4 mg via ORAL
  Filled 2011-01-27: qty 1

## 2011-01-27 NOTE — Progress Notes (Addendum)
Pain Management Nursing Progress Note:     Tara Perry is a 65 y.o. female admitted with Peritonitis.  Chief Complaint:  Abdominal pain with L knee pain at times  LOS:  LOS: 2 days   POD: 2  Procedure: abdominal exploration, laparotomy    PMH:   Past Medical History   Diagnosis Date   . Diabetes mellitus 1992   . 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 Colonoscopy 2002     normal     BMI: Body mass index is 23.34 kg/(m^2).      Analgesic Information:     IVPCA: HYDROmorphone:  Continuous rate: none  PCA Demand Dose: 0.3 mg  Lockout Interval: 10 minute  Max Doses per Hour: 6                 PCA prescriber: Dept of anesthesia  Usage 1.5 mg  In 12 hrs      Assessments              Pain Assessment  Pain Assessment: Numeric Scale (0-10) (01/27/11 1150)  Pain Score: 2-mild pain (01/27/11 1150)  POSS Score: Awake and Alert (01/27/11 1150)  Pain Location: Incision (01/27/11 1150)  Pain Descriptors: Aching (01/27/11 1150)  Pain Frequency: Increases with movement (01/27/11 1150)  Effect of Pain on Daily Activities: mild (01/27/11 1150)  Patient's Stated Comfort Functional Goal: 1-mild pain (01/27/11 0815)  Multiple Pain Sites: Yes (01/27/11 1150)      Pain Score 2: 4-moderate pain (01/27/11 1150)  POSS Score 1: Awake and Alert (01/27/11 1150)  Pain Location 2: Knee (01/27/11 1150)  Pain Orientation 2: Left (01/27/11 1150)  Pain Frequency 2: Intermittent (01/27/11 1150)            Respiratory Assessment:  Oxygen Therapy  SpO2: 94 % (01/27/11 1138)  O2 Device: Nasal cannula (01/27/11 0400)  O2 Flow Rate (L/min): 2 L/min (01/27/11 0400)  Pulse Oximetry Type: Continuous (01/26/11 0200)  Resp Rate: 16  (01/27/11 1138)  Sleep Apneic Periods:no. pt awake during assessment    Nutrition:  Nutrition  Diet Type:  (clears) (01/27/11 1150)  Feeding Route:  (clear lq) (01/27/11 0800)  GI Status:      Last BM Date:  (PTA) (01/26/11 1100)      Passing Flatus: No  (01/27/11 0800)        Side effects:none    Suggestions: Continue pca until pt passing gas and diet advanced.    Note was completed by Lysbeth Penner    Anesthesiology Acute Pain Service Attending Note    Pain Service Progress Note and interval history reviewed:  agree    Patient seen and examined.    History:  intermittent    Physical exam:  awake and alert     Plan:  Continue    Level of Service:  1    Ailene Rud

## 2011-01-27 NOTE — Progress Notes (Signed)
ACUTE CARE SURGERY / TRAUMA DAILY PROGRESS NOTE    Date/Time: 01/27/2011 6:04 AM  Patient Name: Tara Perry  Primary Care Physician: Kennon Holter, MD, MD  Hospital Day: 2  Post-op Day: 1 (ex-lap)    Assessment/Plan:     The patient has the following active problems:  Patient Active Problem List   Diagnoses   . Peritonitis   . Viral cardiomyopathy   . Type II diabetes mellitus without mention of complication       Plan by systems:  Neuro: continue pain control, transition to po pain meds once tolerating diet  Pulm: encourage IS  CV: HD acceptable  GI: clear liquid diet, advance as tolerated  Heme/ID: SCDs for DVT prophylaxis  Renal: Adequate UOP, d/c foley  Disposition: home once tolerating po, ambulating, and pain controlled on po meds      ATTENDING:   The patient is postoperative day number 2 after exploratory laparotomy for  an acute abdomen.  Found to have an inflamed loop of small bowel with a  suppurative exudate, but no evidence of perforation.  The patient is currently on  intravenous antibiotics and is doing well.  Her vital signs are normal and  she is afebrile.  No systemic evidence of sepsis.  Her abdomen is  nondistended and soft, appropriately tender.  She has been walking around  in the hallways without assistance.     The plan is to start clear liquids and advance the diet to regular later in  the day.  Possible discharge to home today.  If discharged today, we will  place the patient on Cipro and Flagyl or a week, and follow up in the  office in 1 [place]  [id number]            Interval History:   Tara Perry is a 65 y.o. female who presents to the hospital with peritonitis, s/p exploratory laparotomy with no identifiable source.  Pt states pain much improved. Ambulating. Tolerating sips. No flatus, BM.    Allergies:     Allergies   Allergen Reactions   . Shellfish-Derived Products Anaphylaxis       Medications:     Current Facility-Administered Medications   Medication Dose Route  Frequency Provider Last Rate Last Dose   . 0.9%  NaCl infusion   Intravenous Continuous Bernette Mayers, MD 100 mL/hr at 01/26/11 1409     . acetaminophen (TYLENOL) tablet 650 mg  650 mg Oral Q6H PRN Leanne Chang, MD       . atorvastatin (LIPITOR) tablet 10 mg  10 mg Oral Q24H Marshall Cork, MD   10 mg at 01/26/11 2143   . carvedilol (COREG) tablet 25 mg  25 mg Oral Q12H SCH Marshall Cork, MD   25 mg at 01/26/11 2142   . dextrose (GLUTOSE) 40 % oral gel 15 g  15 g Oral PRN Madlyn Frankel Key, MD       . dextrose 50% (D50W) bolus 25 mL  25 mL Intravenous PRN Bernette Mayers, MD       . diphenhydrAMINE (BENADRYL) injection 12.5 mg  12.5 mg Intravenous Q6H PRN Leanne Chang, MD       . glucagon (rDNA) [facility]) injection 1 mg  1 mg Intramuscular PRN Bernette Mayers, MD       . heparin (porcine) injection 5,000 Units  5,000 Units Subcutaneous [facility] Bernette Mayers, MD   5,000  Units at 01/26/11 2143   . HYDROmorphone (DILAUDID) 0.2 mg/mL in sodium chloride 0.9% 100 mL PCA   Intravenous Continuous Leanne Chang, MD       . insulin aspart (NovoLOG) injection 2 Units  2 Units Subcutaneous Once Marshall Cork, MD   2 Units at 01/26/11 1747   . lactated ringers infusion   Intravenous Continuous Leanne Chang, MD       . lisinopril (PRINIVIL,ZESTRIL) tablet 20 mg  20 mg Oral Daily Marshall Cork, MD   20 mg at 01/26/11 1750   . metFORMIN (GLUCOPHAGE) tablet 1,000 mg  1,000 mg Oral BID MEALS Marshall Cork, MD   1,000 mg at 01/26/11 1750   . naloxone [facility]) injection 0.2 mg  0.2 mg Intravenous PRN Bernette Mayers, MD       . piperacillin-tazobactam (ZOSYN) 4.5 g in sodium chloride 0.9 % 100 mL IVPB mini-bag plus  4.5 g Intravenous Q6H Bernette Mayers, MD 200 mL/hr at 01/27/11 0000 4.5 g at 01/27/11 0000   . sitaGLIPtin (JANUVIA) tablet 100 mg  100 mg Oral Daily Marshall Cork, MD   100 mg at 01/26/11 1755   . DISCONTD: glimepiride (AMARYL) tablet 2 mg  2 mg Oral QHS Marshall Cork, MD       .  DISCONTD: heparin (porcine) injection 5,000 Units  5,000 Units Subcutaneous [facility] Bernette Mayers, MD       . DISCONTD: insulin regular (HumuLIN,NovoLIN) injection 1-5 Units  1-5 Units Subcutaneous PRN Bernette Mayers, MD   3 Units at 01/26/11 0845   . DISCONTD: naloxone [facility]) injection 0.1 mg  0.1 mg Intravenous PRN Leanne Chang, MD       . DISCONTD: piperacillin-tazobactam (ZOSYN) 4.5 g in sodium chloride 0.9 % 100 mL IVPB mini-bag plus  4.5 g Intravenous Once Mohammed A Almulhim, MD       . DISCONTD: piperacillin-tazobactam (ZOSYN) 4.5 g in sodium chloride 0.9 % 100 mL IVPB mini-bag plus  4.5 g Intravenous  [facility] Bernette Mayers, MD 200 mL/hr at 01/26/11 0507 4.5 g at 01/26/11 0507   . DISCONTD: sodium chloride (PF) 0.9 % flush 3 mL  3 mL Intravenous Q8H Leanne Chang, MD           Labs:     Results     Procedure Component Value Units Date/Time    Blood Culture #1 [[id number]] Collected:01/25/11 1819    Specimen Information:Blood / Blood Updated:01/26/11 2015    Narrative:    Site?->peripheral  ORDER#: [id number]                                    ORDERED BY: Junita Push  SOURCE: Blood                                        COLLECTED:  01/25/11 18:19  ANTIBIOTICS AT COLL.:                                RECEIVED :  01/25/11 20:10  Culture Blood                              PRELIM  01/26/11 20:15  01/26/11   No Growth after 1 day/s of incubation.      Blood Culture #2 [[id number]] Collected:01/25/11 1819    Specimen Information:Blood / Blood Updated:01/26/11 2015    Narrative:    Site?->peripheral  ORDER#: [id number]                                    ORDERED BY: ALMULHIM, MOHAM  SOURCE: Blood                                        COLLECTED:  01/25/11 18:19  ANTIBIOTICS AT COLL.:                                RECEIVED :  01/25/11 20:10  Culture Blood                              PRELIM      01/26/11 20:15  01/26/11   No Growth after 1 day/s of incubation.      POCT glucose (Q6H) [[id number]]   (Abnormal) Collected:01/26/11 1700     POCT Glucose WB 270 (A) mg/dL [id number]:03/14/70 5366    POCT glucose (Q6H) [[id number]]  (Abnormal) Collected:01/26/11 1654    Specimen Information:Blood Updated:01/26/11 1654     POCT Glucose WB 270 (A) mg/dL     Basic metabolic panel (QAM x 1) [[id number]]  (Abnormal) Collected:01/26/11 0842    Specimen Information:Blood Updated:01/26/11 1034     Glucose 274 (H) mg/dL      BUN 28 (H) mg/dL      Creatinine 1.0 mg/dL      Calcium 7.3 (L) mg/dL      Sodium 332 (L) mEq/L      Potassium 4.2 mEq/L      Chloride 102 mEq/L      CO2 20 (L) mEq/L     Narrative:    patient is in t-pacu x3408 01/26/2011  09:20    GFR [[id number]] Collected:01/26/11 0842     GFR Caucasian 55   Updated:01/26/11 1034    Narrative:    patient is in t-pacu x3408 01/26/2011  09:20    CBC and differential (QAM x 1) [[id number]]  (Abnormal) Collected:01/26/11 0842    Specimen Information:Blood Updated:01/26/11 0957     White Blood Cells 12.50 (H) x10 3/uL      RBC 3.76 (L) x10 6/uL      Hgb 11.0 (L) g/dL      Hematocrit 93.2 (L) %      MCV 84.8 fL      MCH 29.3 pg      MCHC 34.5 g/dL      RDW 13 %      Platelets 188 x10 3/uL      MPV 9.9 fL      Neutrophils 90 (H) %      Lymphocytes Automated 6 (L) %      Monocytes Automated 3 %      Eosinophils Automated 0 %      Basophils Automated 0 %      Immature Granulocyte 1 %      Nucleated RBC . 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    Radiology Results (24 Hour)     ** No Results found for the last 24 hours. **          Physical Exam:   Tmax:  Temp (24hrs), Avg:96.9 F (36.1 C), Min:96 F (35.6 C), Max:98 F (36.7 C)      Vital Signs:  Filed Vitals:    01/27/11 0400   BP: 117/61   Pulse: 73   Temp: 96 F (35.6 C)   Resp: 16       I/O:  Intake and Output Summary (Last 24 hours) at Date Time    Intake/Output Summary (Last 24 hours) at 01/27/11 0604  Last data filed at 01/26/11 1800   Gross per 24 hour   Intake   1548 ml   Output   1935 ml   Net   -387 ml       IVF:  Intravenous fluids were administered, Normal Saline 100 ml/hr.    Nutrition: Clear Liquid Diet    Lines/Drains/Airways:    Urethral Catheter Double-lumen 18 Fr. (Active)   Line necessity reviewed? Yes 01/27/2011  4:00 AM   Site Assessment Clean;Intact 01/27/2011  4:00 AM   Collection Container Standard drainage bag 01/27/2011  4:00 AM   Reason for Continuing Urinary Catheterization past POD 1 Strict I&O (incontinent patient only) 01/27/2011  4:00 AM   Output (mL) 1450 mL 01/26/2011  6:00 PM   Number of days:2           Peripheral IV 01/25/11 Left;Anterior Antecubital (Active)   Site Assessment Clean;Dry 01/27/2011  4:00 AM   Line Status Infusing 01/27/2011  4:00 AM   Tubing Dated? Yes 01/27/2011  4:00 AM   Dressing Status Dry;Clean 01/27/2011  4:00 AM   Dressing Dated? Yes 01/27/2011  4:00 AM   Number of days:2                                                   ROS:  Review of Systems   Constitutional: Negative for fever and chills.   Eyes: Negative for blurred vision.   Respiratory: Negative for shortness of breath.    Cardiovascular: Negative for chest pain and palpitations.   Gastrointestinal: Negative for nausea, vomiting and abdominal pain.   Neurological: Negative for  dizziness, sensory change and headaches.       Physical Exam:  Physical Exam   Constitutional: She is oriented to person, place, and time. She appears  well-developed and well-nourished. No distress.   HENT:   Head: Normocephalic and atraumatic.   Eyes: Pupils are equal, round, and reactive to light.   Neck: Normal range of motion. Neck supple.   Cardiovascular: Normal rate, regular rhythm and intact distal pulses.  Exam reveals no gallop and no friction rub.    No murmur heard.  Pulmonary/Chest: Effort normal and breath sounds normal. No respiratory distress. She has no wheezes.   Abdominal: Soft. Bowel sounds are normal. She exhibits no distension. There is tenderness (minimal, LLQ). There is no rebound and no guarding.   Musculoskeletal: Normal range of motion. She exhibits no edema.   Neurological: She is alert and oriented to person, place, and time.   Skin: Skin is warm and dry.         Janine Limbo. Betsey Holiday, MD  Resident, General Surgery  [phone]      Attestation:   I have reviewed the notes, assessments, and/or procedures performed by the resident/NP/PA.  I am in agreement with their plan upon my signature as an Attending.    01/27/2011 6:04 AM

## 2011-01-28 LAB — POCT GLUCOSE
Whole Blood Glucose POCT: 201 mg/dL — AB (ref 70–100)
Whole Blood Glucose POCT: 230 mg/dL — AB (ref 70–100)
Whole Blood Glucose POCT: 255 mg/dL — AB (ref 70–100)

## 2011-01-28 MED ORDER — METRONIDAZOLE 500 MG PO TABS
500.00 mg | ORAL_TABLET | Freq: Three times a day (TID) | ORAL | Status: AC
Start: 2011-01-28 — End: 2011-02-07

## 2011-01-28 MED ORDER — HYDROMORPHONE HCL 2 MG PO TABS
2.00 mg | ORAL_TABLET | ORAL | Status: AC | PRN
Start: 2011-01-28 — End: 2011-02-07

## 2011-01-28 MED ORDER — METRONIDAZOLE 250 MG PO TABS
500.00 mg | ORAL_TABLET | Freq: Three times a day (TID) | ORAL | Status: DC
Start: 2011-01-28 — End: 2011-01-28
  Administered 2011-01-28 (×2): 500 mg via ORAL
  Filled 2011-01-28 (×2): qty 2

## 2011-01-28 MED ORDER — CIPROFLOXACIN HCL 500 MG PO TABS
500.00 mg | ORAL_TABLET | Freq: Two times a day (BID) | ORAL | Status: DC
Start: 2011-01-28 — End: 2011-01-28
  Administered 2011-01-28: 500 mg via ORAL
  Filled 2011-01-28 (×2): qty 1

## 2011-01-28 MED ORDER — CIPROFLOXACIN HCL 500 MG PO TABS
500.00 mg | ORAL_TABLET | Freq: Two times a day (BID) | ORAL | Status: AC
Start: 2011-01-28 — End: 2011-02-07

## 2011-01-28 NOTE — Progress Notes (Signed)
Pt A & O x3. OOB independently. Vitals are stable. Abd soft, flatus present, No N/V, tolearated soft diet. IV out. Discharge instructions explained to the pt regarding cont. Home meds, diet, activity and F/U time. Prescriptions given for Cipro, Flagyl and Diludid. Pt understood all the [place] instructions. She is going home with her family. She escorted from the floor at 1630pm by the Wheel Chair.

## 2011-01-28 NOTE — Progress Notes (Signed)
ACUTE CARE SURGERY / TRAUMA DAILY PROGRESS NOTE    Date/Time: 01/28/2011 7:46 AM  Patient Name: Tara Perry  Primary Care Physician: Kennon Holter, MD, MD  Hospital Day: 3  Post-op Day: 3 ex lap    Assessment/Plan:     The patient has the following active problems:  Patient Active Problem List   Diagnoses   . Peritonitis   . Viral cardiomyopathy   . Type II diabetes mellitus without mention of complication       Plan by systems:  Neuro: monitor    Pulm: OOB, deep breathing, IS  CV: monitor vitals  Endo:   GI: monitor bowel function,   Heme/ID: on Zosyn, will start cipro/flagyl  Renal: foley d/c'd + void - monitor UOP  Neuromuscular: OOB ambulating    Wounds: local wound care    Disposition: pending d/c - will evaluate after breakfast.    Gerrit Halls Sheppard Evens MD  ID# [id number]  Surgery Resident      ATTENDING:   The patient is postoperative day number 3 after exploratory laparotomy for  suppurative peritonitis.  Yesterday we started her on a soft diet.  She was able  to eat mashed potatoes and vegetables.  She tolerated that well.  She has  not moved her bowels yet, but she is passing gas.  She continues to be able  to ambulate in the hallways without assistance.  Her pain is improved.  She  only takes for pain medicine before she walks.  I took her dressings down  today and the wound was clean.  The plan is to discharge to home today on  Cipro and Flagyl and advance the diet gradually.  We will see the patient  in followup in 7 to [place]  [id number]            None    Interval History:   Tara Perry is a 65 y.o. female who presents to the hospital after Other: Abdominal pain.  Pt tolerated soft diet. Denies N/V.  + flatus and grumblings this morning.  + OOB. Urinating well. Denies fever/chills.      Allergies:     Allergies   Allergen Reactions   . Shellfish-Derived Products Anaphylaxis       Medications:     Current Facility-Administered Medications   Medication Dose Route Frequency Provider Last Rate  Last Dose   . 0.9%  NaCl infusion   Intravenous Continuous Bernette Mayers, MD 100 mL/hr at 01/28/11 0035     . atorvastatin (LIPITOR) tablet 10 mg  10 mg Oral Q24H Marshall Cork, MD   10 mg at 01/27/11 2220   . carvedilol (COREG) tablet 25 mg  25 mg Oral Q12H SCH Marshall Cork, MD   25 mg at 01/27/11 2019   . dextrose (GLUTOSE) 40 % oral gel 15 g  15 g Oral PRN Madlyn Frankel Key, MD       . dextrose 50% (D50W) bolus 25 mL  25 mL Intravenous PRN Bernette Mayers, MD       . glucagon (rDNA) (GLUCAGEN) injection 1 mg  1 mg Intramuscular PRN Bernette Mayers, MD       . heparin (porcine) injection 5,000 Units  5,000 Units Subcutaneous [facility] Bernette Mayers, MD   5,000 Units at 01/28/11 [phone]   . HYDROmorphone (DILAUDID) tablet 2 mg  2 mg Oral Q3H PRN Marshall Cork, MD   2  mg at 01/28/11 0158   . HYDROmorphone (DILAUDID) tablet 4 mg  4 mg Oral Q3H PRN Marshall Cork, MD       . lisinopril (PRINIVIL,ZESTRIL) tablet 20 mg  20 mg Oral Daily Marshall Cork, MD   20 mg at 01/27/11 0919   . metFORMIN (GLUCOPHAGE) tablet 1,000 mg  1,000 mg Oral BID MEALS Marshall Cork, MD   1,000 mg at 01/27/11 1751   . naloxone [facility]) injection 0.2 mg  0.2 mg Intravenous PRN Bernette Mayers, MD       . piperacillin-tazobactam (ZOSYN) 4.5 g in sodium chloride 0.9 % 100 mL IVPB mini-bag plus  4.5 g Intravenous Q6H Bernette Mayers, MD 200 mL/hr at 01/28/11 0621 4.5 g at 01/28/11 0621   . sitaGLIPtin (JANUVIA) tablet 100 mg  100 mg Oral Daily Marshall Cork, MD   100 mg at 01/27/11 0916   . DISCONTD: acetaminophen (TYLENOL) tablet 650 mg  650 mg Oral Q6H PRN Leanne Chang, MD       . DISCONTD: diphenhydrAMINE (BENADRYL) injection 12.5 mg  12.5 mg Intravenous Q6H PRN Leanne Chang, MD       . DISCONTD: HYDROmorphone (DILAUDID) 0.2 mg/mL in sodium chloride 0.9% 100 mL PCA   Intravenous Continuous Leanne Chang, MD       . DISCONTD: lactated ringers infusion   Intravenous Continuous Leanne Chang, MD           Labs:      Results     Procedure Component Value Units Date/Time    POCT glucose (Q6H) [[id number]]  (Abnormal) Collected:01/28/11 0648     POCT Glucose WB 230 (A) mg/dL [id number]:14/78/29 5621    POCT glucose (Q6H) [[id number]]  (Abnormal) Collected:01/28/11 0030     POCT Glucose WB 255 (A) mg/dL [id number]:41/32/44 0102    Blood Culture #1 [[id number]] Collected:01/25/11 1819    Specimen Information:Blood / Blood Updated:01/27/11 2015    Narrative:    Site?->peripheral  ORDER#: [id number]                                    ORDERED BY: ALMULHIM, MOHAM  SOURCE: Blood                                        COLLECTED:  01/25/11 18:19  ANTIBIOTICS AT COLL.:                                RECEIVED :  01/25/11 20:10  Culture Blood                              PRELIM      01/27/11 20:15  01/26/11   No Growth after 1 day/s of incubation.  01/27/11   No Growth after 2 day/s of incubation.      Blood Culture #2 [[id number]] Collected:01/25/11 1819    Specimen Information:Blood / Blood Updated:01/27/11 2015    Narrative:    Site?->peripheral  ORDER#: [id number]                                    ORDERED BY:  ALMULHIM, MOHAM  SOURCE: Blood                                        COLLECTED:  01/25/11 18:19  ANTIBIOTICS AT COLL.:                                RECEIVED :  01/25/11 20:10  Culture Blood                              PRELIM      01/27/11 20:15  01/26/11   No Growth after 1 day/s of incubation.  01/27/11   No Growth after 2 day/s of incubation.      POCT glucose (Q6H) [[id number]]  (Abnormal) Collected:01/27/11 1651    Specimen Information:Blood Updated:01/27/11 1652     POCT Glucose WB 258 (A) mg/dL     POCT glucose (W0J) [[id number]]  (Abnormal) Collected:01/27/11 1200    Specimen Information:Blood Updated:01/27/11 1220     POCT Glucose WB 240 (A) mg/dL     POCT glucose (N5A) [[id number]]  (Abnormal) Collected:01/27/11 0830    Specimen Information:Blood Updated:01/27/11 0839     POCT Glucose WB 237 (A) mg/dL           Rads:    Radiological Procedure reviewed.    Radiology Results (24 Hour)     Procedure Component Value Units Date/Time    XR Knee 3 Views Left [[id number]] Collected:01/27/11 1829    Order Status:Completed  Updated:01/27/11 4132    Narrative:    CLINICAL HISTORY: Pain.     FINDINGS: AP, lateral and oblique views of the left knee obtained.     No acute fracture or dislocation. Mild degenerative change at the  patellofemoral compartment. Small suprapatellar enthesophyte. Lateral  view is slightly limited by patient positioning. There is suggestion of  a suprapatellar joint effusion. No destructive bone changes.       Impression:    Mild degenerative changes at the patellofemoral compartment  and suggestion of a suprapatellar joint effusion.          Physical Exam:   Tmax:  Temp (24hrs), Avg:96.5 F (35.8 C), Min:95.2 F (35.1 C), Max:97.7 F (36.5 C)      Vital Signs:  Filed Vitals:    01/28/11 0407   BP: 117/63   Pulse: 68   Temp: 97.7 F (36.5 C)   Resp: 16       I/O:  Intake and Output Summary (Last 24 hours) at Date Time    Intake/Output Summary (Last 24 hours) at 01/28/11 0746  Last data filed at 01/27/11 1800   Gross per 24 hour   Intake   1800 ml   Output    400 ml   Net   1400 ml       IVF:  Intravenous fluids were administered, Normal Saline 100 ml/hr.    Nutrition: Mechanical Soft Diet    Lines/Drains/Airways:        Urethral Catheter Double-lumen 18 Fr. (Removed)   Removed 01/27/11 0700   Line necessity reviewed? Yes 01/27/2011  4:00 AM   Site Assessment Clean;Intact 01/27/2011  4:00 AM   Collection Container Standard drainage bag 01/27/2011  4:00 AM   Reason for Continuing Urinary Catheterization past POD 1 Strict I&O (incontinent patient only) 01/27/2011  4:00 AM   Output (mL) 400 mL 01/27/2011  6:00 PM   Number of days:2       Peripheral IV 01/25/11 Left;Anterior Antecubital (Active)   Site Assessment Clean;Dry;Intact 01/27/2011  8:00 PM   Line Status Infusing 01/27/2011  8:00 PM   Tubing Dated? Yes  01/27/2011  8:00 PM   Dressing Status Clean;Dry;Intact 01/27/2011  8:00 PM   Dressing Dated? Yes 01/27/2011  8:00 PM   Number of days:3       Peripheral IV Right Antecubital (Active)   Site Assessment Clean;Dry;Intact 01/27/2011  8:00 PM   Line Status Saline Locked 01/27/2011  8:00 PM   Dressing Status Old drainage 01/27/2011  8:00 PM   Number of days:                                                   ROS:  Review of Systems   Constitutional: Negative for fever and chills.   Respiratory: Negative for cough.    Cardiovascular: Negative for chest pain.   Gastrointestinal: Positive for abdominal pain (appropriate painperi-incisional). Negative for nausea, vomiting, diarrhea and constipation.        + flatus and + grumbling. Tolerated soft diet   Skin: Negative for rash.   Neurological: Negative for dizziness and headaches.       Physical Exam:  Physical Exam   Constitutional: She is oriented to person, place, and time. She appears well-developed and well-nourished.   HENT:   Head: Normocephalic.   Eyes: Pupils are equal, round, and reactive to light.   Neck: Neck supple.   Cardiovascular: Normal rate, regular rhythm and normal heart sounds.    Pulmonary/Chest: Effort normal and breath sounds normal. No respiratory distress.   Abdominal: Soft. Bowel sounds are normal. She exhibits no distension. There is Tenderness: appropriate.. There is no rebound and no guarding.   Musculoskeletal: Normal range of motion.   Neurological: She is alert and oriented to person, place, and time.   Skin: Skin is dry.             Attestation:   I have reviewed the notes, assessments, and/or procedures performed by the resident/NP/PA.  I am in agreement with their plan upon my signature as an Attending.    01/28/2011 7:46 AM

## 2011-01-28 NOTE — Progress Notes (Signed)
Pt a&ox3, vss, oob ambulating in hallway and to bathroom. Hypoactive BS, -flatus. C/o abd pain and left knee pain 0-5/10; pt requests for pain medication dilaudid 2 mg PO. Pt reports relief and satisfaction with pain management. Mid abd incision covered with dressing clean, dry, and intact. Will continue to monitor pt for pain management, administer medication as necessary.

## 2011-01-28 NOTE — Discharge Summary (Signed)
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  SHe was started on IV Abx and then converted to PO Abx.  She progressed with her diet to where she was tolerating prior to discharge.    Significant Diagnostic Studies: CT abd/pelvis  Treatments:     Discharge Exam:  BP 137/70  Pulse 65  Temp(Src) 96.4 F (35.8 C) (Axillary)  Resp 18  Ht 1.626 m (5\' 4" )  Wt 61.689 kg (136 lb)  BMI 23.34 kg/m2  SpO2 95%    General Appearance:    Alert, cooperative, no distress, appears stated age   Head:    Normocephalic, without obvious abnormality, atraumatic   Eyes:    PERRL, conjunctiva/corneas clear, EOM's intact, fundi     benign, both eyes   Ears:    Normal TM's and external ear canals, both ears   Nose:   Nares normal, septum midline, mucosa normal, no drainage    or sinus tenderness   Throat:   Lips, mucosa, and tongue normal; teeth and gums normal   Neck:   Supple, symmetrical, trachea midline, no adenopathy;     thyroid:  no enlargement/tenderness/nodules; no carotid    bruit or JVD   Back:     Symmetric, no curvature, ROM normal, no CVA tenderness   Lungs:     Clear to auscultation bilaterally, respirations unlabored   Chest Wall:    No tenderness or deformity    Heart:    Regular rate and rhythm, S1 and S2 normal, no murmur, rub   or gallop   Breast Exam:    No tenderness, masses, or nipple  abnormality   Abdomen:     Soft, minimal tenderness, bowel sounds active all four quadrants, stables in place.    no masses, no organomegaly   Genitalia:    Normal female without lesion, discharge or tenderness   Rectal:    Normal tone, normal prostate, no masses or tenderness;    guaiac negative stool   Extremities:   Extremities normal, atraumatic, no cyanosis or edema   Pulses:   2+ and symmetric all extremities   Skin:   Skin color, texture, turgor normal, no rashes or lesions   Lymph nodes:   Cervical, supraclavicular, and axillary nodes normal   Neurologic:   CNII-XII intact, normal strength, sensation and reflexes     throughout       Disposition: Home or Self Care    Patient Instructions:   Current Discharge Medication List      CONTINUE these medications which have NOT CHANGED    Details   aspirin 81 MG EC tablet Take 81 mg by mouth daily.        atorvastatin (LIPITOR) 10 MG tablet Take 10 mg by mouth daily.       !! Carvedilol (COREG PO) Take by mouth.       !! carvedilol (COREG) 25 MG tablet  Take 25 mg by mouth 2 (two) times daily. Morning and nights      glimepiride (AMARYL) 2 MG tablet Take 2 mg by mouth daily. At night       lisinopril (PRINIVIL,ZESTRIL) 20 MG tablet Take 20 mg by mouth daily.        metFORMIN (GLUCOPHAGE) 500 MG tablet Take 500 mg by mouth 2 (two) times daily. Morning and night      Multiple Vitamins-Minerals (CENTRUM PO) Take by mouth.        sitaGLIPtin (JANUVIA) 100 MG tablet Take 100 mg by mouth daily. At night       vitamin B-12 (CYANOCOBALAMIN) 100 MCG tablet Take 50 mcg by mouth daily.         !! - Potential duplicate medications found. Please discuss with provider.      STOP taking these medications       METFORMIN HCL ER, MOD, PO            Activity: ambulate in house and no lifting, Driving, or Strenuous exercise for atleast 2 weeks.  No driving while on pain medication  Diet: encourage fluids and soft diet  Wound Care: keep wound clean and dry, will remove stables in  clinic    Follow-up with Surgery in 2 weeks.    Attending: [place] [id number]

## 2011-01-28 NOTE — Discharge Instructions (Signed)
Will go home with course of antibiotics and pain medication  Cipro 500mg  PO BID  Flagyl 500mg  PO TID    Dilaudid 2 mg 1-2 tablets orally every 4 hrs as needed

## 2011-01-31 ENCOUNTER — Emergency Department: Payer: Medicare Other

## 2011-01-31 ENCOUNTER — Emergency Department
Admission: EM | Admit: 2011-01-31 | Discharge: 2011-01-31 | Disposition: A | Discharge status: Home / Self Care | Payer: Medicare Other | Attending: Emergency Medicine | Admitting: Emergency Medicine

## 2011-01-31 DIAGNOSIS — M7989 Other specified soft tissue disorders: Secondary | ICD-10-CM | POA: Insufficient documentation

## 2011-01-31 DIAGNOSIS — Z8639 Personal history of other endocrine, nutritional and metabolic disease: Secondary | ICD-10-CM | POA: Insufficient documentation

## 2011-01-31 DIAGNOSIS — I519 Heart disease, unspecified: Secondary | ICD-10-CM | POA: Insufficient documentation

## 2011-01-31 DIAGNOSIS — M1710 Unilateral primary osteoarthritis, unspecified knee: Secondary | ICD-10-CM

## 2011-01-31 DIAGNOSIS — IMO0002 Reserved for concepts with insufficient information to code with codable children: Secondary | ICD-10-CM | POA: Insufficient documentation

## 2011-01-31 DIAGNOSIS — E119 Type 2 diabetes mellitus without complications: Secondary | ICD-10-CM | POA: Insufficient documentation

## 2011-01-31 DIAGNOSIS — Z862 Personal history of diseases of the blood and blood-forming organs and certain disorders involving the immune mechanism: Secondary | ICD-10-CM | POA: Insufficient documentation

## 2011-01-31 LAB — BODY FLUID CELL COUNT
Body Fluid Lymphocytes: 3 %
Body Fluid Monocyte/Macrophage Cells: 12 %
Body Fluid Polymorphonuclear Cell: 85 %
Body Fluid RBC: 3694 /mm3
Body Fluid WBC: 39000 /mm3

## 2011-01-31 LAB — GLUCOSE, BODY FLUID: Body Fluid Glucose: 113 mg/dL

## 2011-01-31 LAB — PROTEIN, BODY FLUID: Body Fluid Protein: 3.1 g/dL

## 2011-01-31 LAB — BODY FLUID CRYSTAL

## 2011-01-31 MED ORDER — DIPHENHYDRAMINE HCL 25 MG PO CAPS
25.00 mg | ORAL_CAPSULE | Freq: Once | ORAL | Status: AC
Start: 2011-01-31 — End: 2011-01-31
  Administered 2011-01-31: 25 mg via ORAL
  Filled 2011-01-31: qty 1

## 2011-01-31 NOTE — ED Provider Notes (Signed)
History     Chief Complaint   Patient presents with   . Knee Pain     Patient is a 65 y.o. female presenting with knee pain. The history is provided by the patient.   Knee Pain  This is a new problem. The current episode started in the past 7 days. The problem occurs intermittently. The problem has been gradually worsening. Associated symptoms include joint swelling. Pertinent negatives include no abdominal pain, chills, congestion, coughing, fever, myalgias, nausea, rash or vomiting. The symptoms are aggravated by walking.     65yo f h/o CAD, DM post op day 6 of ischemic volvulus sx c/o 6 days of L side knee pain. Pain started prior to the sx getting worse, pain with ambulation, no pain at rest. Located on lateral aspect and radiate down Lateral calf. No injury,no paresthesia, no weakness, no cp, no sob, no abd pain, no n/v, no fc, seen by surgeon for r/o dvt.   Past Medical History   Diagnosis Date   . Diabetes mellitus 1992   . 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 Colonoscopy 2002     normal       Family History   Problem Relation Age of Onset   . Cancer Father 66     bladder       No current facility-administered medications for this encounter.     Current Outpatient Prescriptions   Medication Sig Dispense Refill   . atorvastatin (LIPITOR) 10 MG tablet Take 10 mg by mouth daily.        . Carvedilol (COREG PO) Take by mouth.        . carvedilol (COREG) 25 MG tablet Take 25 mg by mouth 2 (two) times daily. Morning and nights       . ciprofloxacin (CIPRO) 500 MG tablet Take 1 tablet (500 mg total) by mouth every 12 (twelve) hours.  14 tablet  0   . glimepiride (AMARYL) 2 MG tablet Take 2 mg by mouth daily. At night        . HYDROmorphone (DILAUDID) 2 MG tablet Take 1 tablet (2 mg total) by mouth every 3 (three) hours as needed (1-2 tabs every 3 hours as needed for pain).  60 tablet  0   . lisinopril (PRINIVIL,ZESTRIL) 20 MG tablet Take 20 mg by  mouth daily.         . metFORMIN (GLUCOPHAGE) 500 MG tablet Take 500 mg by mouth 2 (two) times daily. Morning and night       . metroNIDAZOLE (FLAGYL) 500 MG tablet Take 1 tablet (500 mg total) by mouth every 8 (eight) hours.  21 tablet  0   . Multiple Vitamins-Minerals (CENTRUM PO) Take by mouth.         . sitaGLIPtin (JANUVIA) 100 MG tablet Take 100 mg by mouth daily. At night        . vitamin B-12 (CYANOCOBALAMIN) 100 MCG tablet Take 50 mcg by mouth daily.             Allergies   Allergen Reactions   . Shellfish-Derived Products Anaphylaxis       History   Substance Use Topics   . Smoking status: Never Smoker    . Smokeless tobacco: Never Used   . Alcohol Use: Yes      social       Review of  Systems   Constitutional: Negative for fever and chills.   HENT: Negative for congestion and rhinorrhea.    Eyes: Negative for discharge and redness.   Respiratory: Negative for cough.    Gastrointestinal: Negative for nausea, vomiting and abdominal pain.   Musculoskeletal: Positive for joint swelling and gait problem. Negative for myalgias and back pain.   Skin: Negative for color change and rash.       Physical Exam   BP 149/87  Pulse 105  Temp(Src) 97.4 F (36.3 C) (Oral)  Resp 16  SpO2 96%    Physical Exam   Constitutional: She is oriented to person, place, and time. She appears well-developed and well-nourished.   HENT:   Head: Normocephalic and atraumatic.   Eyes: Conjunctivae and EOM are normal.   Neck: Normal range of motion. Neck supple.   Abdominal: There is no tenderness. There is no guarding.   Musculoskeletal: She exhibits tenderness.        Left knee: She exhibits decreased range of motion, swelling and erythema. no tenderness found.        Legs:  Neurological: She is alert and oriented to person, place, and time. She has normal reflexes.   Skin: Skin is warm and dry.   Psychiatric: She has a normal mood and affect. Her behavior is normal. Judgment and thought content normal.       ED Course      Arthrocentesis  Date/Time: 01/31/2011 5:50 PM  Performed by: Edmonia Caprio  Authorized by: Edmonia Caprio  Consent: Written consent obtained.  Risks and benefits: risks, benefits and alternatives were discussed  Consent given by: patient  Patient understanding: patient states understanding of the procedure being performed  Patient consent: the patient's understanding of the procedure matches consent given  Procedure consent: procedure consent matches procedure scheduled  Relevant documents: relevant documents present and verified  Imaging studies: imaging studies available  Patient identity confirmed: verbally with patient  Indications: joint swelling and pain   Body area: knee  Local anesthesia used: yes  Anesthesia: local infiltration  Local anesthetic: lidocaine 1% with epinephrine  Anesthetic total: 8 ml  Needle gauge: 18 G  Approach: lateral  Aspirate: serous  Aspirate amount: 30 ml  Patient tolerance: Patient tolerated the procedure well with no immediate complications.        MDM      Treatment Team: Scribe: Judi Saa    [place], [place]  01/31/11 2217    Marybelle Killings, MD  03/07/11 1537

## 2011-01-31 NOTE — ED Notes (Signed)
Family at bedside. 

## 2011-01-31 NOTE — ED Notes (Signed)
Patient is resting comfortably. 

## 2011-01-31 NOTE — ED Notes (Signed)
Patient is resting comfortably. No apparent distress

## 2011-01-31 NOTE — ED Notes (Signed)
Family at bedside. Awaiting on Disposition.

## 2011-01-31 NOTE — ED Notes (Signed)
Vital signs stable. 

## 2011-01-31 NOTE — ED Notes (Signed)
MD at bedside. Pt doing synovial fluid aspiration on L knee.

## 2011-01-31 NOTE — Discharge Instructions (Signed)
OSTEOARTHRITIS  Osteoarthritis (also called "Degenerative Joint Disease") is the most common form of arthritis in adults over 50. It is not the same as Rheumatoid Arthritis.  The exact cause is not known but may be related to excess "wear and tear" on the joint over a long period of time. Prior injury to that joint, or repeated stress on a joint can also cause this type of arthritis. Osteoarthritis most often affects the hands, knees, spine and hips (in that order). The most common symptoms are joint stiffness, pain and swelling.  HOME CARE:   When a joint is more sore than usual, rest that joint for a day or two.   Heat is very helpful. This can be provided by taking hot baths, applying a heating pad for up to 30 minutes at a time. Because symptoms are usually worse in the morning, many patients like to take a hot bath just after awakening to relax the muscle and soothe the joints.   Exercise is the most important part of home treatment for osteoarthritis. This prevents the muscles and ligaments around the joint from becoming weak and helps maintain the full range of joint motion. This limits further damage to the joint.   If you are overweight, this puts a lot of extra strain on weight-bearing joints of the lower back, hips, knees, feet and ankles. Losing weight will improve your arthritis symptoms in these joints. Talk to your doctor about a safe and effective weight loss program for yourself.   Anti-inflammatory medicine such as ibuprofen (Advil, Motrin) or naproxen (Aleve) is often used to treat this condition. If this alone is not helping, your doctor may prescribe a stronger medicine. If narcotic pain medicines have been prescribed, they should be used in addition to anti-inflammatory drugs and only for severe pain.  FOLLOW UP with your doctor as advised by our staff.  GET PROMPT MEDICAL ATTENTION if any of the following occur:   Redness or swelling of a painful joint   Fever of 100.4F (38C) or  higher, or as directed by your healthcare provider   Worsening joint pain   2000-2011 [employer], [address], [place] [address]. All rights reserved. This information is not intended as a substitute for professional medical care. Always follow your healthcare professional's instructions.

## 2011-01-31 NOTE — ED Provider Notes (Signed)
The patient was seen and examined by PA or Fellow; plan of care was discussed with me.     Marybelle Killings, MD  3:49 PM  01/31/2011        Edmonia Caprio, PA  02/23/11 2221    Edmonia Caprio, PA  02/23/11 5784    Marybelle Killings, MD  03/07/11 1537

## 2011-01-31 NOTE — ED Notes (Signed)
Recent abd surgery. Her today c/o L knee and lower leg pain and swelling. Pt told to come here for doppler study.

## 2011-02-08 ENCOUNTER — Encounter (INDEPENDENT_AMBULATORY_CARE_PROVIDER_SITE_OTHER): Payer: Self-pay | Admitting: Surgery

## 2011-02-08 ENCOUNTER — Ambulatory Visit (INDEPENDENT_AMBULATORY_CARE_PROVIDER_SITE_OTHER): Payer: Medicare Other | Admitting: Surgery

## 2011-02-08 VITALS — BP 107/66 | HR 91 | Temp 97.6°F | Wt 134.4 lb

## 2011-02-08 DIAGNOSIS — M109 Gout, unspecified: Secondary | ICD-10-CM

## 2011-02-08 NOTE — Progress Notes (Signed)
Subjective:   Tara Perry is a 65 y.o. female who is here for had a chief complaint of Follow-up.    Past Medical History   Diagnosis Date   . Diabetes mellitus 1992   . 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 Gout synovitis    . Gout spondylitis    . Gout synovitis      Past Surgical History   Procedure Date   . Colonoscopy 2002     normal   . Exploratory laparotomy      Family History   Problem Relation Age of Onset   . Cancer Father 52     bladder     History     Social History   . Marital Status: Widowed     Spouse Name: N/A     Number of Children: N/A   . Years of Education: N/A     Occupational History   . Not on file.     Social History Main Topics   . Smoking status: Never Smoker    . Smokeless tobacco: Never Used   . Alcohol Use: Yes      social   . Drug Use: No   . Sexually Active: Not on file     Other Topics Concern   . Not on file     Social History Narrative   . No narrative on file     Shellfish-derived products  Current Outpatient Prescriptions   Medication Sig Dispense Refill   . atorvastatin (LIPITOR) 10 MG tablet Take 10 mg by mouth daily.        . Carvedilol (COREG PO) Take by mouth.        . carvedilol (COREG) 25 MG tablet Take 25 mg by mouth 2 (two) times daily. Morning and nights       . lisinopril (PRINIVIL,ZESTRIL) 20 MG tablet Take 20 mg by mouth daily.         . metFORMIN (GLUCOPHAGE) 500 MG tablet Take 500 mg by mouth 2 (two) times daily. Morning and night       . Multiple Vitamins-Minerals (CENTRUM PO) Take by mouth.         . sitaGLIPtin (JANUVIA) 100 MG tablet Take 100 mg by mouth daily. At night        . ciprofloxacin (CIPRO) 500 MG tablet Take 1 tablet (500 mg total) by mouth every 12 (twelve) hours.  14 tablet  0   . glimepiride (AMARYL) 2 MG tablet Take 2 mg by mouth daily. At night       . HYDROmorphone (DILAUDID) 2 MG tablet Take 1 tablet (2 mg total) by mouth every 3 (three) hours as needed (1-2 tabs every 3 hours as needed for pain).  60 tablet  0   .  metroNIDAZOLE (FLAGYL) 500 MG tablet Take 1 tablet (500 mg total) by mouth every 8 (eight) hours.  21 tablet  0   . vitamin B-12 (CYANOCOBALAMIN) 100 MCG tablet Take 50 mcg by mouth daily.             Review of Systems   Musculoskeletal: Positive for joint swelling (right knee swollen, tender).   All other systems reviewed and are negative.      Objective:     Filed Vitals:    02/08/11 1417   BP: 107/66   Pulse: 91   Temp: 97.6 F (36.4 C)     Physical Exam   Vitals  reviewed.  Constitutional: She is oriented to person, place, and time. She appears well-developed and well-nourished.   Neck: Neck supple. No tracheal deviation present.   Cardiovascular: Normal rate and regular rhythm.    Pulmonary/Chest: Effort normal and breath sounds normal.   Abdominal: Soft.        Incision healing with staples in place   Musculoskeletal: Normal range of motion.        Left knee: She exhibits swelling and effusion. tenderness found.        Except left knee   Neurological: She is alert and oriented to person, place, and time.   Skin: Skin is warm and dry.   Psychiatric: She has a normal mood and affect.     Assessment:     1. Gout synovitis        Previous RAD Imaging:  Knee Left Ap And Lateral    01/31/2011  HISTORY: Pain   No fracture, dislocation, or significant bone lesion is identified in the left knee.  Joint spaces are normal.  There is no obvious joint effusion.       01/31/2011   Normal study.         Patient Active Problem List   Diagnoses Date Noted   . Gout synovitis 02/08/2011   . Peritonitis 01/25/2011   . 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 Type II diabetes mellitus without mention of complication 01/25/2011       Plan:   The patient is a 65 year old woman 2 weeks status post exploratory  laparotomy for peritonitis.  While she had some jejunal inflammation and  purulent fluid in her abdominal cavity, there was no evidence of  perforation and she did not undergo resection.  Of note, her main  complaint, both from the  time of her peritonitis until she was discharged  from the hospital and today, is left knee swelling for which she has been  evaluated both while still an inpatient postoperatively as well as subsequently in the emergency department.  While still  in the hospital, she underwent multiple imaging of her left knee as well as  a duplex.  She was seen last week in the emergency department where she  underwent a tap of her knee and was diagnosed with gout synovitis.  I have  recommended that she follow up with her primary physician, Dr. Jacqlyn Larsen, to  refer her to a rheumatologist.  Otherwise, she is doing well, tolerating a  regular diet, requiring no narcotic pain medication.  She does still have  some Dilaudid at home and occasionally is using it for her knee pain, but  not for her post-surgical pain.  I explained to her that she can take 600  mg of ibuprofen with food or with milk up to 4 times a day.  Her skin  staples will be removed today and she will be discharged from the clinic.

## 2011-02-08 NOTE — Patient Instructions (Signed)
May bathe tomorrow if no open wounds.  Follow up with Dr Jacqlyn Larsen for referral to rheumatologist.

## 2011-02-28 NOTE — Procedures (Signed)
,            Transthoracic Echocardiogram      2D, M-mode, Doppler, and Color Doppler      Study date:  31-Jul-2006            [name]      MR #: [id number]      Account #: [id number]      DOB: 1945/11/18      Age: 65 years      Gender: Female      Height:      Weight:      BSA:            Allergies: NO KNOWN DRUG ALLERGIES, SHELLFISH            Sonographer:  Rhona Raider, RN            CLINICAL QUESTION: Assess left ventricular function.            HISTORY: PRIOR HISTORY: DM.            PROCEDURE: The procedure was performed at the bedside. The transthoracic      approach was used. The study included complete 2D imaging, M-mode, complete      spectral Doppler, and color Doppler. Image quality was adequate.            SYSTEM MEASUREMENT TABLES            2D      %FS: 22.99 %      EDV(Teich): 106.96 ml      EF(Teich): 46.07 %      ESV(Teich): 57.68 ml      IVSd: 1.08 cm      LA Diam: 3.53 cm      LVIDd: 4.79 cm      LVIDs: 3.69 cm      LVPWd: .98 cm      RVIDd: 2.09 cm      SV(Teich): 49.28 ml            CW      AV Vmax: 135.27 cm/s      AV maxPG: 7.32 mmHg      MR Vmax: 496.3 cm/s      MR maxPG: 98.52 mmHg      MV VTI: 13.2 cm      MV Vmax: 133.53 cm/s      MV Vmean: 88.17 cm/s      MV maxPG: 7.13 mmHg      MV meanPG: 3.61 mmHg      PV Vmax: 122.38 cm/s      PV maxPG: 5.99 mmHg      TV Vmax: 51.16 cm/s      TV maxPG: 1.05 mmHg            MM      AV Cusp: 1.81 cm      Ao Diam: 2.55 cm            PW      E': .05 m/s      IVRT: 88.72 ms      LVOT Vmax: 96.67 cm/s      LVOT maxPG: 3.74 mmHg      RVOT Vmax: 105.76 cm/s      RVOT maxPG: 4.47 mmHg            LEFT VENTRICLE: Size was normal. Systolic function was normal. Ejection fraction      was estimated in the range of 30 % to 40 %. There were no  regional wall motion      abnormalities. There was moderate diffuse hypokinesis. Wall thickness was      normal.            VENTRICULAR SEPTUM: There was moderate dyssynergic motion. These changes are       consistent with a conduction abnormality or paced rhythm.            AORTIC VALVE: The valve was trileaflet. Leaflets exhibited normal thickness and      normal cuspal separation. Doppler: Transaortic velocity was within the normal      range. There was no stenosis. There was no regurgitation.            AORTA: The root exhibited normal size.            MITRAL VALVE: Valve structure was normal. There was normal leaflet separation.      Doppler: The transmitral velocity was within the normal range. There was no      evidence for stenosis. There was no regurgitation.            LEFT ATRIUM: Size was normal.            RIGHT VENTRICLE: The size was normal. Systolic function was normal. Wall      thickness was normal.            PULMONIC VALVE: Leaflets exhibited normal thickness, no calcification, and      normal cuspal separation. Doppler: The transpulmonic velocity was within the      normal range. There was no regurgitation.            PULMONARY ARTERY: The size was normal. Doppler: Systolic pressure was within the      normal range.            TRICUSPID VALVE: The valve structure was normal. There was normal leaflet      separation. Doppler: The transtricuspid velocity was within the normal range.      There was no evidence for tricuspid stenosis. There was no regurgitation.            RIGHT ATRIUM: Size was normal.            SYSTEMIC VEINS: IVC: The inferior vena cava was normal in size.            PERICARDIUM: There was no pericardial effusion.            IMPRESSIONS:      moderately reduced ejection fraction with abnormal septal motion consistent with      conduction delay.nl valvular structure.            Prepared and signed by            Norton Blizzard, MD      Signed 31-Jul-2006 12:48:46            (N: [id number])

## 2011-03-07 NOTE — ED Provider Notes (Addendum)
History       Past Medical History   Diagnosis Date   . Diabetes mellitus 1992   . 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 Gout synovitis    . Gout spondylitis    . Gout synovitis      Family History   Problem Relation Age of Onset   . Cancer Father 77     bladder     No current facility-administered medications for this encounter.     Current Outpatient Prescriptions   Medication Sig Dispense Refill   . atorvastatin (LIPITOR) 10 MG tablet Take 10 mg by mouth daily.        . Carvedilol (COREG PO) Take by mouth.        . carvedilol (COREG) 25 MG tablet Take 25 mg by mouth 2 (two) times daily. Morning and nights       . glimepiride (AMARYL) 2 MG tablet Take 2 mg by mouth daily. At night       . lisinopril (PRINIVIL,ZESTRIL) 20 MG tablet Take 20 mg by mouth daily.         . metFORMIN (GLUCOPHAGE) 500 MG tablet Take 500 mg by mouth 2 (two) times daily. Morning and night       . Multiple Vitamins-Minerals (CENTRUM PO) Take by mouth.         . sitaGLIPtin (JANUVIA) 100 MG tablet Take 100 mg by mouth daily. At night        . vitamin B-12 (CYANOCOBALAMIN) 100 MCG tablet Take 50 mcg by mouth daily.           Allergies   Allergen Reactions   . Shellfish-Derived Products Anaphylaxis     History   Substance Use Topics   . Smoking status: Never Smoker    . Smokeless tobacco: Never Used   . Alcohol Use: Yes      social       Review of Systems    Physical Exam   BP 161/70  Pulse 94  Temp(Src) 97.4 F (36.3 C) (Oral)  Resp 18  SpO2 96%    Physical Exam    ED Course   Procedures    MDM  Physician/Midlevel provider first contact with patient: (not recorded)  Case and planing  was discussed with me    Treatment Team: Scribe: Judi Saa    Marybelle Killings, MD  03/07/11 1409    Marybelle Killings, MD  03/07/11 [phone]

## 2012-09-13 ENCOUNTER — Inpatient Hospital Stay
Admission: EM | Admit: 2012-09-13 | Discharge: 2012-09-15 | DRG: 292 | Disposition: A | Discharge status: Home / Self Care | Payer: Medicare Other | Attending: Internal Medicine | Admitting: Internal Medicine

## 2012-09-13 ENCOUNTER — Emergency Department: Payer: Medicare Other

## 2012-09-13 ENCOUNTER — Inpatient Hospital Stay: Payer: Medicare Other | Admitting: Internal Medicine

## 2012-09-13 ENCOUNTER — Inpatient Hospital Stay: Payer: Medicare Other

## 2012-09-13 DIAGNOSIS — I10 Essential (primary) hypertension: Secondary | ICD-10-CM | POA: Diagnosis present

## 2012-09-13 DIAGNOSIS — I6529 Occlusion and stenosis of unspecified carotid artery: Secondary | ICD-10-CM | POA: Diagnosis present

## 2012-09-13 DIAGNOSIS — I5022 Chronic systolic (congestive) heart failure: Principal | ICD-10-CM | POA: Diagnosis present

## 2012-09-13 DIAGNOSIS — E119 Type 2 diabetes mellitus without complications: Secondary | ICD-10-CM | POA: Diagnosis present

## 2012-09-13 DIAGNOSIS — B3324 Viral cardiomyopathy: Secondary | ICD-10-CM

## 2012-09-13 DIAGNOSIS — N390 Urinary tract infection, site not specified: Secondary | ICD-10-CM | POA: Diagnosis present

## 2012-09-13 DIAGNOSIS — R6510 Systemic inflammatory response syndrome (SIRS) of non-infectious origin without acute organ dysfunction: Secondary | ICD-10-CM | POA: Diagnosis present

## 2012-09-13 DIAGNOSIS — J811 Chronic pulmonary edema: Secondary | ICD-10-CM | POA: Diagnosis present

## 2012-09-13 DIAGNOSIS — I428 Other cardiomyopathies: Secondary | ICD-10-CM | POA: Diagnosis present

## 2012-09-13 DIAGNOSIS — I447 Left bundle-branch block, unspecified: Secondary | ICD-10-CM | POA: Diagnosis present

## 2012-09-13 DIAGNOSIS — N179 Acute kidney failure, unspecified: Secondary | ICD-10-CM | POA: Diagnosis not present

## 2012-09-13 DIAGNOSIS — E785 Hyperlipidemia, unspecified: Secondary | ICD-10-CM | POA: Diagnosis present

## 2012-09-13 DIAGNOSIS — I509 Heart failure, unspecified: Secondary | ICD-10-CM | POA: Diagnosis present

## 2012-09-13 DIAGNOSIS — Z7982 Long term (current) use of aspirin: Secondary | ICD-10-CM

## 2012-09-13 DIAGNOSIS — B961 Klebsiella pneumoniae [K. pneumoniae] as the cause of diseases classified elsewhere: Secondary | ICD-10-CM | POA: Diagnosis present

## 2012-09-13 LAB — POCT GLUCOSE: Whole Blood Glucose POCT: 223 mg/dL — AB (ref 70–100)

## 2012-09-13 LAB — URINALYSIS WITH MICROSCOPIC
Bilirubin, UA: NEGATIVE
Blood, UA: NEGATIVE
Glucose, UA: NEGATIVE
Ketones UA: NEGATIVE
Nitrite, UA: POSITIVE — AB
Specific Gravity UA: 1.019 (ref 1.001–1.035)
Urine pH: 6 (ref 5.0–8.0)
Urobilinogen, UA: NORMAL mg/dL

## 2012-09-13 LAB — CBC AND DIFFERENTIAL
Basophils Absolute Automated: 0.05 (ref 0.00–0.20)
Basophils Automated: 0 %
Eosinophils Absolute Automated: 0.56 (ref 0.00–0.70)
Eosinophils Automated: 5 %
Hematocrit: 35.6 % — ABNORMAL LOW (ref 37.0–47.0)
Hgb: 11.7 g/dL — ABNORMAL LOW (ref 12.0–16.0)
Immature Granulocytes Absolute: 0.02
Immature Granulocytes: 0 %
Lymphocytes Absolute Automated: 2.17 (ref 0.50–4.40)
Lymphocytes Automated: 20 %
MCH: 29.8 pg (ref 28.0–32.0)
MCHC: 32.9 g/dL (ref 32.0–36.0)
MCV: 90.6 fL (ref 80.0–100.0)
MPV: 10.2 fL (ref 9.4–12.3)
Monocytes Absolute Automated: 0.97 (ref 0.00–1.20)
Monocytes: 9 %
Neutrophils Absolute: 7.23 (ref 1.80–8.10)
Neutrophils: 66 %
Nucleated RBC: 0 (ref 0–1)
Platelets: 276 (ref 140–400)
RBC: 3.93 — ABNORMAL LOW (ref 4.20–5.40)
RDW: 13 % (ref 12–15)
WBC: 11 — ABNORMAL HIGH (ref 3.50–10.80)

## 2012-09-13 LAB — B-TYPE NATRIURETIC PEPTIDE: B-Natriuretic Peptide: 167 pg/mL — ABNORMAL HIGH (ref 0–100)

## 2012-09-13 LAB — HEMOGLOBIN A1C: Hemoglobin A1C: 6.2 % — ABNORMAL HIGH (ref 0.0–6.0)

## 2012-09-13 LAB — ECG 12-LEAD
Atrial Rate: 99 {beats}/min
Atrial Rate: 77 {beats}/min
P Axis: 72 degrees
P Axis: 68 degrees
P-R Interval: 178 ms
P-R Interval: 164 ms
Q-T Interval: 416 ms
Q-T Interval: 454 ms
QRS Duration: 154 ms
QRS Duration: 158 ms
QTC Calculation (Bezet): 533 ms
QTC Calculation (Bezet): 513 ms
R Axis: -61 degrees
R Axis: -54 degrees
T Axis: 71 degrees
T Axis: 47 degrees
Ventricular Rate: 99 {beats}/min
Ventricular Rate: 77 {beats}/min

## 2012-09-13 LAB — HEPATIC FUNCTION PANEL
ALT: 12 U/L (ref 0–55)
AST (SGOT): 18 U/L (ref 5–34)
Albumin/Globulin Ratio: 1.3 (ref 0.9–2.2)
Albumin: 3.8 g/dL (ref 3.5–5.0)
Alkaline Phosphatase: 76 U/L (ref 40–150)
Bilirubin Direct: 0.1 mg/dL (ref 0.0–0.5)
Bilirubin Indirect: 0.2 mg/dL (ref 0.0–1.1)
Bilirubin, Total: 0.3 mg/dL (ref 0.2–1.2)
Globulin: 2.9 g/dL (ref 2.0–3.6)
Protein, Total: 6.7 g/dL (ref 6.0–8.3)

## 2012-09-13 LAB — PT/INR
PT INR: 1 (ref 0.9–1.1)
PT: 13.5 (ref 12.6–15.0)

## 2012-09-13 LAB — CKMB
Creatinine Kinase MB (CKMB): 3.2 ng/mL (ref 0.0–4.9)
Creatinine Kinase MB (CKMB): 2.5 ng/mL (ref 0.0–4.9)

## 2012-09-13 LAB — GFR: EGFR: 44.7

## 2012-09-13 LAB — TSH: TSH: 1.06 (ref 0.35–4.94)

## 2012-09-13 LAB — HEMOLYSIS INDEX: Hemolysis Index: 13 — ABNORMAL HIGH (ref 0–9)

## 2012-09-13 LAB — CK
Creatine Kinase (CK): 173 U/L — ABNORMAL HIGH (ref 29–168)
Creatine Kinase (CK): 157 U/L (ref 29–168)

## 2012-09-13 LAB — I-STAT TROPONIN: i-STAT Troponin: 0.01 ng/mL (ref 0.00–0.09)

## 2012-09-13 LAB — LIPID PANEL
Cholesterol / HDL Ratio: 3.8
Cholesterol: 183 mg/dL (ref 0–199)
HDL: 48 mg/dL (ref >40)
LDL Calculated: 111 mg/dL — ABNORMAL HIGH (ref 0–99)
Triglycerides: 121 mg/dL (ref 34–149)
VLDL Calculated: 24 mg/dL (ref 10–40)

## 2012-09-13 LAB — BASIC METABOLIC PANEL
BUN: 28 mg/dL — ABNORMAL HIGH (ref 7.0–19.0)
CO2: 20 — ABNORMAL LOW (ref 22–29)
Calcium: 9.1 mg/dL (ref 8.5–10.5)
Chloride: 110 — ABNORMAL HIGH (ref 98–107)
Creatinine: 1.2 mg/dL — ABNORMAL HIGH (ref 0.6–1.0)
Glucose: 184 mg/dL — ABNORMAL HIGH (ref 70–100)
Potassium: 4.3 (ref 3.5–5.1)
Sodium: 140 (ref 136–145)

## 2012-09-13 LAB — TROPONIN I
Troponin I: 0.01 ng/mL (ref 0.00–0.09)
Troponin I: 0.02 ng/mL (ref 0.00–0.09)

## 2012-09-13 LAB — MICROALBUMIN, RANDOM URINE
Urine Creatinine, Random: 73
Urine Microalbumin, Random: 31 — ABNORMAL HIGH (ref 0.0–30.0)
Urine Microalbumin/Creatinine Ratio: 42 ug/mg — ABNORMAL LOW (ref 66–165)

## 2012-09-13 MED ORDER — VITAMINS/MINERALS PO TABS
1.0000 | ORAL_TABLET | Freq: Every day | ORAL | Status: DC
Start: 2012-09-13 — End: 2012-09-15
  Administered 2012-09-13 – 2012-09-15 (×3): 1 via ORAL
  Filled 2012-09-13 (×3): qty 1

## 2012-09-13 MED ORDER — ASPIRIN 81 MG PO CHEW
81.0000 mg | CHEWABLE_TABLET | Freq: Once | ORAL | Status: AC
Start: 2012-09-13 — End: 2012-09-13
  Administered 2012-09-13: 81 mg via ORAL
  Filled 2012-09-13: qty 1

## 2012-09-13 MED ORDER — GLUCOSE 40 % PO GEL
15.0000 g | ORAL | Status: DC | PRN
Start: 2012-09-13 — End: 2012-09-15

## 2012-09-13 MED ORDER — GLUCAGON HCL (RDNA) 1 MG IJ SOLR
1.0000 mg | INTRAMUSCULAR | Status: DC | PRN
Start: 2012-09-13 — End: 2012-09-15

## 2012-09-13 MED ORDER — LISINOPRIL 20 MG PO TABS
20.0000 mg | ORAL_TABLET | Freq: Every day | ORAL | Status: DC
Start: 2012-09-13 — End: 2012-09-13

## 2012-09-13 MED ORDER — DEXTROSE 50 % IV SOLN
25.0000 mL | INTRAVENOUS | Status: DC | PRN
Start: 2012-09-13 — End: 2012-09-15

## 2012-09-13 MED ORDER — GLUCAGON HCL (RDNA) 1 MG IJ SOLR
1.0000 mg | INTRAMUSCULAR | Status: DC | PRN
Start: 2012-09-13 — End: 2012-09-14

## 2012-09-13 MED ORDER — LISINOPRIL 20 MG PO TABS
20.0000 mg | ORAL_TABLET | Freq: Every day | ORAL | Status: DC
Start: 2012-09-13 — End: 2012-09-14
  Administered 2012-09-13: 20 mg via ORAL
  Filled 2012-09-13: qty 1

## 2012-09-13 MED ORDER — FAMOTIDINE 20 MG PO TABS
20.00 mg | ORAL_TABLET | Freq: Two times a day (BID) | ORAL | Status: DC
Start: 2012-09-13 — End: 2012-09-15
  Administered 2012-09-13 – 2012-09-15 (×4): 20 mg via ORAL
  Filled 2012-09-13 (×4): qty 1

## 2012-09-13 MED ORDER — DEXTROSE 50 % IV SOLN
25.0000 mL | INTRAVENOUS | Status: DC | PRN
Start: 2012-09-13 — End: 2012-09-14

## 2012-09-13 MED ORDER — SPIRONOLACTONE 25 MG PO TABS
25.0000 mg | ORAL_TABLET | Freq: Every day | ORAL | Status: DC
Start: 2012-09-13 — End: 2012-09-14
  Administered 2012-09-13: 25 mg via ORAL
  Filled 2012-09-13: qty 1

## 2012-09-13 MED ORDER — VITAMIN B-12 500 MCG PO TABS
1000.0000 ug | ORAL_TABLET | Freq: Every day | ORAL | Status: DC
Start: 2012-09-13 — End: 2012-09-15
  Administered 2012-09-13 – 2012-09-15 (×3): 1000 ug via ORAL
  Filled 2012-09-13 (×3): qty 2

## 2012-09-13 MED ORDER — GLUCOSE 40 % PO GEL
15.0000 g | ORAL | Status: DC | PRN
Start: 2012-09-13 — End: 2012-09-14

## 2012-09-13 MED ORDER — FUROSEMIDE 10 MG/ML IJ SOLN
40.0000 mg | Freq: Two times a day (BID) | INTRAMUSCULAR | Status: DC
Start: 2012-09-13 — End: 2012-09-13

## 2012-09-13 MED ORDER — FUROSEMIDE 10 MG/ML IJ SOLN
40.0000 mg | Freq: Once | INTRAMUSCULAR | Status: AC
Start: 2012-09-13 — End: 2012-09-13
  Administered 2012-09-13: 40 mg via INTRAVENOUS
  Filled 2012-09-13: qty 4

## 2012-09-13 MED ORDER — ASPIRIN 81 MG PO CHEW
81.0000 mg | CHEWABLE_TABLET | Freq: Every day | ORAL | Status: DC
Start: 2012-09-13 — End: 2012-09-15
  Administered 2012-09-13 – 2012-09-15 (×3): 81 mg via ORAL
  Filled 2012-09-13 (×3): qty 1

## 2012-09-13 MED ORDER — ATORVASTATIN CALCIUM 10 MG PO TABS
10.0000 mg | ORAL_TABLET | Freq: Every day | ORAL | Status: DC
Start: 2012-09-13 — End: 2012-09-15
  Administered 2012-09-13 – 2012-09-15 (×3): 10 mg via ORAL
  Filled 2012-09-13 (×3): qty 1

## 2012-09-13 MED ORDER — CARVEDILOL 25 MG PO TABS
25.0000 mg | ORAL_TABLET | Freq: Two times a day (BID) | ORAL | Status: DC
Start: 2012-09-13 — End: 2012-09-15
  Administered 2012-09-13 – 2012-09-15 (×5): 25 mg via ORAL
  Filled 2012-09-13 (×5): qty 1

## 2012-09-13 MED ORDER — FUROSEMIDE 10 MG/ML IJ SOLN
40.0000 mg | Freq: Two times a day (BID) | INTRAMUSCULAR | Status: AC
Start: 2012-09-13 — End: 2012-09-13
  Administered 2012-09-13: 40 mg via INTRAVENOUS
  Filled 2012-09-13: qty 4

## 2012-09-13 MED ORDER — INSULIN ASPART 100 UNIT/ML SC SOLN
1.0000 [IU] | Freq: Three times a day (TID) | SUBCUTANEOUS | Status: DC | PRN
Start: 2012-09-13 — End: 2012-09-15
  Administered 2012-09-13 – 2012-09-14 (×3): 1 [IU] via SUBCUTANEOUS
  Filled 2012-09-13 (×2): qty 10

## 2012-09-13 MED ORDER — ENOXAPARIN SODIUM 40 MG/0.4ML SC SOLN
40.0000 mg | Freq: Every day | SUBCUTANEOUS | Status: DC
Start: 2012-09-13 — End: 2012-09-14
  Administered 2012-09-13: 40 mg via SUBCUTANEOUS
  Filled 2012-09-13: qty 0.4

## 2012-09-13 MED ORDER — ATORVASTATIN CALCIUM 10 MG PO TABS
10.0000 mg | ORAL_TABLET | Freq: Every day | ORAL | Status: DC
Start: 2012-09-13 — End: 2012-09-13

## 2012-09-13 MED ORDER — INSULIN ASPART 100 UNIT/ML SC SOLN
1.0000 [IU] | Freq: Three times a day (TID) | SUBCUTANEOUS | Status: DC | PRN
Start: 2012-09-13 — End: 2012-09-14

## 2012-09-13 NOTE — ED Notes (Signed)
Trop 0.01

## 2012-09-13 NOTE — ED Notes (Signed)
Pain Assessed, repositioned as needed, pillow offered, elimination needs assessed, and call light within reach.

## 2012-09-13 NOTE — Progress Notes (Signed)
Pt took morning meds after returning from XR, family at bedside, I/Os documented and urine sample obtained.

## 2012-09-13 NOTE — Plan of Care (Signed)
OBSERVATION UNIT PLAN OF CARE NOTE     Date Time: 09/13/2012 10:59 AM  Patient Name: Tara Perry, Tara Perry  MRN: [id number]    TELEMETRY:  BP 134/65  Pulse 79  Temp 97.4 F (36.3 C) (Oral)  Resp 20  SpO2 96%  Assessment/Plan  Patient in bed with family at bedside in stable condition  Patient states she's no more SOB and have ambulated without any difficulty.  Lung sound clear, bilat  Neg trop x 1, CE x 2 more pending  Cardiology consult called to [facility]     Rexford Maus, NP  Adult Observation Unit Nurse Practitioner  [phone] or [phone]

## 2012-09-13 NOTE — ED Provider Notes (Signed)
Physician/Midlevel provider first contact with patient: 09/13/12 0237       Diagnosis   1. SOB (shortness of breath)    2. Pulmonary edema        Disposition   ED Disposition     Admit Bed Type: Telemetry [5]  Admitting Physician: Eldridge Abrahams [[id number]]  Patient Class: Hospital Outpatient Surgery (Amb Proc) [106]             Meds administered in ER:     Medications   aspirin chewable tablet 81 mg (81 mg Oral Given 09/13/12 0309)   furosemide (LASIX) injection 40 mg (40 mg Intravenous Given 09/13/12 0425)     New Prescriptions    No medications on file      __________________________________________________   Chief Complaint  Chief Complaint   Patient presents with   . Diff breathing          HPI   Tara Perry is a 67 y.o. female with PMHx of Cardiomyopathy of unknown etiology and DM p/w trouble breathing since 1:45 this AM. She woke up due respiratory distress although laying down does not worsen the condition. Pt had congestive heart failure with unknown etiology, but was told possibly due to viral infection. Pt felt well yesterday, except she felt "a bit warm".   She took baby ASA x1 today.   Denies pain.   No cough.   Pt sts she has baseline low BP.     Pt began taking a low dose of estrogen cream for vaginal itchiness since a week ago from yesterday.     PMD   Moles, Neomia Glass, MD    ROS   -Documented in HPI. Otherwise all other systems reviewed and negative.   -Nursing notes reviewed by me.     -Past Medical/Surgical/Family/Social History reviewed by me     PE  Filed Vitals:    09/13/12 0235 09/13/12 0402 09/13/12 0426   BP: 135/94 151/73 140/72   Pulse: 110 89 84   Temp: 95.6 F (35.3 C)     Resp: 20 18 25    SpO2: 99% 97% 100%        Vitals reviewed and interpreted by me  GEN: . Nontoxic, Well appearing, NAD.     Head: NC/AT.     Eyes: EOMI w/o pain, nl conjunctiva. NO d/c.    ENT: MMM, symmetric OP, no drooling/trismus.    Neck: FROM, supple, no masses, no tenderness.  Chest: bilateral Rales at bases, NO  resp distress. Equal chest rise.   CV: rrr, NO significant murmur appreciated.     Abd: no peritoneal signs, benign, no distention, soft, no tenderness    Back: NO CVAt     UpperExt: NO deformity. FROM, neurovasc intact.       LowerExt: NO edema. FROM, neurovasc intact, no calf ttp, neg Homan's.        Neuro: moving all ext equally, no motor deficits.     Skin: Warm and dry. NO rash.        Psych: Normal affect. Normal insight.          Differential Diagnosis (not completely inclusive):   CHF with bilateral Rales  ACS possible  Cardiomyopathy possible  No fever, not c/w myocarditis/pericarditis    MDM / ED Course:   Crackles on lung exam, per pt sxs c/w prior CHF/CM 6 yrs ago  Given ASA and will admit to Hospitalist.  Stable at time of admission.    EKG -  Reviewed and Interpreted by me: Bernarda Caffey, M.D.   NSR at 99. Lt axis deviation. LBBB. Unchanged compared to the EKG from 01/25/2011.    Pulse Oximetry Analysis interpreted by me - Normal on RA 99  Cardiac Monitor Analysis interpreted by me - Normal Sinus Rhythm at rate of 110s  Laboratory results reviewed by me:  yes  Radiologic study results reviewed by me: yes  Radiologic Studies Interpreted (viewed) by me: yes   Radiologic Interpretation by me: cephalization c/w CHF    Bernarda Caffey, MD   _________________________________________________________________   BP 140/72  Pulse 84  Temp 95.6 F (35.3 C)  Resp 25  SpO2 100%   Past Medical History   Diagnosis Date   . Diabetes mellitus 1992   . 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 Gout synovitis    . Gout spondylitis    . Gout synovitis       Past Surgical History   Procedure Date   . Colonoscopy 2002     normal   . Exploratory laparotomy       Family History   Problem Relation Age of Onset   . Cancer Father 20     bladder      History     Social History   . Marital Status: Widowed     Spouse Name: N/A     Number of Children: N/A   . Years of Education: N/A     Social History Main Topics   . Smoking  status: Never Smoker    . Smokeless tobacco: Never Used   . Alcohol Use: Yes      Comment: social   . Drug Use: No   . Sexually Active: Not on file     Other Topics Concern   . Not on file     Social History Narrative   . No narrative on file     Labs:   Labs Reviewed   CBC AND DIFFERENTIAL - Abnormal; Notable for the following:     WBC 11.00 (*)      RBC 3.93 (*)      Hgb 11.7 (*)      Hematocrit 35.6 (*)      All other components within normal limits   BASIC METABOLIC PANEL - Abnormal; Notable for the following:     Glucose 184 (*)      BUN 28.0 (*)      Creatinine 1.2 (*)      Chloride 110 (*)      CO2 20 (*)      All other components within normal limits   B-TYPE NATRIURETIC PEPTIDE - Abnormal; Notable for the following:     B-Natriuretic Peptide 167 (*)      All other components within normal limits   GFR   I-STAT TROPONIN     Radiology Results (24 Hour)     Procedure Component Value Units Date/Time    Chest AP Portable [[id number]] Collected:09/13/12 0402    Order Status:Completed  Updated:09/13/12 0413    Narrative:    HISTORY: Shortness of breath.    COMPARISON: Comparison is made to prior exam dated 08/02/2006.    FINDINGS: There are increased interstitial markings, most consistent  with edema. Bilateral infiltrates are felt less likely. There are small  bilateral pleural effusions. There is mild cardiomegaly. There is no  pneumothorax.      Impression:     Findings most consistent with  CHF.      Neldon Mc, MD   09/13/2012 4:09 AM        ___________________________________________________________________  I was acting as a scribe for Bernarda Caffey, M.D. on [name]  Treatment Team: Scribe: Laveda Norman     I am the first provider for this patient and I personally performed the services documented. Treatment Team: Scribe: Willaim Bane, Wynelle Link is scribing for me on Curci,Letia L. This note accurately reflects work and decisions made by me.  Bernarda Caffey,  M.D.  ___________________________________________________________________          Daron Offer, MD  09/13/12 [phone]

## 2012-09-13 NOTE — ED Notes (Signed)
Brought in by EMS from home after waking up with shortness of breath. Per EMS pt sating 91% on room air with rales bilaterally. Pt refused bipap and placed on 15 liters nonrebreathing mask now sating 100% but having difficulty speaking due to shortness of breath. Pt alert and oriented.

## 2012-09-13 NOTE — Progress Notes (Signed)
Pt transported for XR. Telemetry notified. Pt placed on 2L NC during transport.

## 2012-09-13 NOTE — ED Notes (Signed)
Bed:S 13<BR> Expected date:<BR> Expected time:<BR> Means of arrival:[id number] - [address]<BR> Comments:<BR>

## 2012-09-13 NOTE — H&P (Signed)
ADMISSION HISTORY AND PHYSICAL EXAM    Date Time: 09/13/2012 8:57 AM  Patient Name: Tara Perry  Attending Physician: Verdene Lennert, MD  Primary Care Physician: Kennon Holter, MD    CC: shortness of breath      Assessment:   Principal Problem:   *Pulmonary edema  Active Problems:   Type 2 diabetes mellitus without complications   SIRS (systemic inflammatory response syndrome)   Viral cardiomyopathy   Dyspnea   CHF (congestive heart failure)      1.  CHF: chronic, last admission 6 years ago.  States felt secondary to viral etiology.  Last episode 6 years ago, and states recent echo EF not changed, around 45%.  Lasix IV bid, strict I and O  Please consult Cardiology, follow up with Cardiology regarding new echo results 08/2012    2.  Dyspnea: secondary to # 1.  Rule out ACS wit cardiac enzymes    3.  SIRS: possibly related to # 1.  No clear infectious etiology, no symptoms.   Will continue to monitor    4.  Type 2 diabetes: fingerstick blood glucose with Aspart    Plan:   1.  Admit to cardiac/telemetry  2.  Cardiac/consistent carb diet  3.  EKG in am  4.  Resume other chronic medications, Coreg, Lisinopril  Watch creatinine with the above Lisinopril  5.  Lasix 40 mg IV q 12 hours  6.  Please consult Cardiology in am, Dr. Timmothy Euler  7. SCD for dvt prophylaxis  8.  Fingerstick blood glucose q ac and hs with low dose SSI Aspart      Disposition:     Anticipated medical stability for discharge: < 2 midnight  Service status: Observation  Reason for ongoing hospitalization: Need rule out with cardiac enzymes, further diuresis, plus Cardiology follow-up  Anticipated discharge needs: None      History of Presenting Illness:   Tara Perry is a 67 y.o. female who presents to the hospital with acute shortness of breath.  States sudden onset which woke her up.  No accompanying chest pain, nausea, diaphoresis.  On presentation to the emergency room, laboratory evaluation showed a white blood cell count 11, chloride 110, bun  28, creatinine 1.2, glucose 184, gfr 44.7, ck 167, troponin negative x 1.  Chest xray consistent with pulmonary venous congestion.  States similar episode 6 years ago, was diagnosed with viral cardiomyopathy.   Last known EF then she thinks is 45 %.  Recent outpatient echocardiogram last month.    In the emergency room, patient was given Aspirin 81 mg by mouth x 1, Lasix 40 mg IV x 1.  States feels much better after treatment in the emergency room.    Past Medical History:     Past Medical History   Diagnosis Date   . Diabetes mellitus 1992   . 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 Gout synovitis    . Gout spondylitis    . Gout synovitis        Available old records reviewed, including:  EPIC reviewed  Hospitalized 01/25/11-01/28/11 for peritonitis  Hospital Course: Pt underwent exploratory laparotomy for peritonitis and was found to have some thickening of the small bowel and purulence in the abdominal cavity. She was started on IV Abx and then converted to PO Abx. She progressed with her diet to where she was tolerating prior to discharge."  Past Surgical History:     Past Surgical History   Procedure Date   . Colonoscopy 2002     normal   . Exploratory laparotomy        Family History:   Father-cancer    Social History:     History   Smoking status   . Never Smoker    Smokeless tobacco   . Never Used     History   Alcohol Use   . Yes     Comment: social     History   Drug Use No       Allergies:     Allergies   Allergen Reactions   . Shellfish-Derived Products Anaphylaxis       Medications:     Current/Home Medications    ATORVASTATIN (LIPITOR) 10 MG TABLET    Take 10 mg by mouth daily.     CARVEDILOL (COREG PO)    Take by mouth.     CARVEDILOL (COREG) 25 MG TABLET    Take 25 mg by mouth 2 (two) times daily. Morning and nights    GLIMEPIRIDE (AMARYL) 2 MG TABLET    Take 2 mg by mouth daily. At night    LISINOPRIL (PRINIVIL,ZESTRIL) 20 MG TABLET    Take 20 mg by mouth daily.       METFORMIN (GLUCOPHAGE) 500 MG TABLET    Take 500 mg by mouth 2 (two) times daily. Morning and night    MULTIPLE VITAMINS-MINERALS (CENTRUM PO)    Take by mouth.      SITAGLIPTIN (JANUVIA) 100 MG TABLET    Take 100 mg by mouth daily. At night     VITAMIN B-12 (CYANOCOBALAMIN) 100 MCG TABLET    Take 50 mcg by mouth daily.          Method by which medications were confirmed on admission: Discussed with patient and daughter at bedside    Review of Systems:   All other systems were reviewed and are negative except:   Pulmonary: shortness of breath, no cough  Cardiovascular: no chest pain, no diaphoresis  Gastrointestinal: no nausea  Genitourinary: no dysuria    Physical Exam:     Patient Vitals for the past 24 hrs:   BP Temp Pulse Resp SpO2   09/13/12 0715 143/64 mmHg - 77  20  99 %   09/13/12 0603 126/60 mmHg - 78  17  97 %   09/13/12 0602 131/67 mmHg - 79  18  97 %   09/13/12 0426 140/72 mmHg - 84  25  100 %   09/13/12 0402 151/73 mmHg - 89  18  97 %   09/13/12 0235 135/94 mmHg 95.6 F (35.3 C) 110  20  99 %     There is no height or weight on file to calculate BMI.    Intake/Output Summary (Last 24 hours) at 09/13/12 0857  Last data filed at 09/13/12 0558   Gross per 24 hour   Intake      0 ml   Output   1300 ml   Net  -1300 ml       General: awake, alert, oriented x 3; now breathing comfortably  HEENT: perrla, eomi, sclera anicteric  oropharynx clear without lesions, mucous membranes moist  Neck: supple, no lymphadenopathy, no thyromegaly, no JVD, no carotid bruits  Cardiovascular: regular rate and rhythm, no murmurs, rubs or gallops  Lungs: clear to auscultation bilaterally, without wheezing, rhonchi, or rales  Abdomen: soft, non-tender,  non-distended; no palpable masses, no hepatosplenomegaly, normoactive bowel sounds, no rebound or guarding  Extremities: no clubbing, cyanosis, or edema  Skin: no rashes or lesions noted        Labs:     Results     Procedure Component Value Units Date/Time    i-Stat Troponin  [[id number]] Collected:09/13/12 0307     i-STAT Troponin 0.01 ng/mL Updated:09/13/12 0409    B-type Natriuretic Peptide (BNP) [[id number]]  (Abnormal) Collected:09/13/12 0308    Specimen Information:Blood Updated:09/13/12 0351     B-Natriuretic Peptide 167 (H) pg/mL     Basic Metabolic Panel (BMP) [[id number]]  (Abnormal) Collected:09/13/12 0308    Specimen Information:Blood Updated:09/13/12 0343     Glucose 184 (H) mg/dL      BUN 84.6 (H) mg/dL      Creatinine 1.2 (H) mg/dL      CALCIUM 9.1 mg/dL      Sodium 962      Potassium 4.3      Chloride 110 (H)      CO2 20 (L)     GFR [[id number]] Collected:09/13/12 0308     EGFR 44.7 Updated:09/13/12 0343    CBC with Differential [[id number]]  (Abnormal) Collected:09/13/12 0308    Specimen Information:Blood / Blood Updated:09/13/12 0325     WBC 11.00 (H)      RBC 3.93 (L)      Hgb 11.7 (L) g/dL      Hematocrit 66.4 (L) %      MCV 90.6 fL      MCH 29.8 pg      MCHC 32.9 g/dL      RDW 13 %      Platelets 276      MPV 10.2 fL      Neutrophils 66 %      Lymphocytes Automated 20 %      Monocytes 9 %      Eosinophils Automated 5 %      Basophils Automated 0 %      Immature Granulocyte 0 %      Nucleated RBC 0      Neutrophils Absolute 7.23      Abs Lymph Automated 2.17      Abs Mono Automated 0.97      Abs Eos Automated 0.56      Absolute Baso Automated 0.05      Absolute Immature Granulocyte 0.02           Imaging personally reviewed, including:   EKG:  LBBB (not new)      CHEST XRAY:  HISTORY: Shortness of breath.   COMPARISON: Comparison is made to prior exam dated 08/02/2006.   FINDINGS: There are increased interstitial markings, most consistent   with edema. Bilateral infiltrates are felt less likely. There are small   bilateral pleural effusions. There is mild cardiomegaly. There is no   pneumothorax.   IMPRESSION:   Findings most consistent with CHF      Safety Checklist  DVT prophylaxis:  CHEST guideline (See page e199S) Mechanical   Foley:  [place] R[facility] Foley protocol Not  present   IVs:  Peripheral   PT/OT: Not needed   Daily CBC & or Chem ordered:  SHM/ABIM guidelines (see #5) Ordered   Reference for approximate charges of common labs: CBC auto diff - $76  BMP - $99  Mg - $79    Signed by: Eldridge Abrahams, MD   cc:Moles, Neomia Glass, MD

## 2012-09-13 NOTE — Plan of Care (Signed)
Pt cardiac enzymes neg x 3, BNP 167, mild chf, diurese with lasix, denies sob, pain. Pt seen by cardiology

## 2012-09-13 NOTE — Progress Notes (Signed)
Pt A/Ox4, pt denies pain or SOB since treated in ED. Daughter at bedside. Pt on telemetry, strict I/Os ordered, will continue to monitor. Pt is independent but because of O2 and SOB, she is moderate fall risk.

## 2012-09-13 NOTE — Plan of Care (Signed)
Pt aox3 states feeling much better pt as given lasix, bnp 167

## 2012-09-13 NOTE — Consults (Signed)
[facility] C[facility] CONSULTATION REPORT  [facility]    Date Time: 09/13/2012 12:38 PM  Patient Name: Tara Perry  Requesting Physician: Verdene Lennert, MD       Reason for Consultation:   CHF      History:   Tara Perry is a 67 y.o. female admitted on 09/13/2012, for whom we are asked to provide cardiac consultation, regarding CHF.  She was found in 2008 to have nonischemic cmp, and chf, with normal coronary angiography.  Recent follow-up echo with LVEF 40-45% -- stable.  She has LBBB.    She was well until the middle of the night, when she awoke with dyspnea.  Also orthopnea.  911.  With oxygen, immediate relief.  Given one dose lasix IV in ER.  She now feels fine.    CAD risks:  DM, HTN, HLD, post-menopause    Past Medical History:     Past Medical History   Diagnosis Date   . Diabetes mellitus 1992   . 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 Gout synovitis    . Gout spondylitis    . Gout synovitis        Past Surgical History:     Past Surgical History   Procedure Date   . Colonoscopy 2002     normal   . Exploratory laparotomy        Family History:     Family History   Problem Relation Age of Onset   . Cancer Father 13     bladder       Social History:     History     Social History   . Marital Status: Widowed     Spouse Name: N/A     Number of Children: N/A   . Years of Education: N/A     Social History Main Topics   . Smoking status: Never Smoker    . Smokeless tobacco: Never Used   . Alcohol Use: Yes      Comment: social   . Drug Use: No   . Sexually Active: Not on file     Other Topics Concern   . Not on file     Social History Narrative   . No narrative on file       Allergies:     Allergies   Allergen Reactions   . Shellfish-Derived Products Anaphylaxis       Medications:     Prescriptions prior to admission   Medication Sig   . estradiol (VAGIFEM) 25 MCG vaginal tablet Place 25 mcg vaginally daily.   Marland Kitchen atorvastatin (LIPITOR) 10 MG tablet Take 10 mg by mouth daily.    .  Carvedilol (COREG PO) Take by mouth.    . carvedilol (COREG) 25 MG tablet Take 25 mg by mouth 2 (two) times daily. Morning and nights   . glimepiride (AMARYL) 2 MG tablet Take 2 mg by mouth daily. At night   . lisinopril (PRINIVIL,ZESTRIL) 20 MG tablet Take 20 mg by mouth daily.     . metFORMIN (GLUCOPHAGE) 500 MG tablet Take 500 mg by mouth 2 (two) times daily. Morning and night   . Multiple Vitamins-Minerals (CENTRUM PO) Take by mouth.     . sitaGLIPtin (JANUVIA) 100 MG tablet Take 100 mg by mouth daily. At night    . vitamin B-12 (CYANOCOBALAMIN) 100 MCG tablet Take 50 mcg by mouth daily.  Current Facility-Administered Medications   Medication Dose Route Frequency   . [COMPLETED] aspirin  81 mg Oral Once   . aspirin  81 mg Oral Daily   . atorvastatin  10 mg Oral Daily   . carvedilol  25 mg Oral Q12H SCH   . enoxaparin  40 mg Subcutaneous Daily   . famotidine  20 mg Oral Q12H SCH   . [COMPLETED] furosemide  40 mg Intravenous Once   . furosemide  40 mg Intravenous BID   . lisinopril  20 mg Oral Daily   . spironolactone  25 mg Oral Daily   . vitamin B-12  1,000 mcg Oral Daily   . vitamins/minerals  1 tablet Oral Daily   . [DISCONTINUED] atorvastatin  10 mg Oral Daily   . [DISCONTINUED] furosemide  40 mg Intravenous BID   . [DISCONTINUED] furosemide  40 mg Intravenous BID   . [DISCONTINUED] lisinopril  20 mg Oral Daily         Review of Systems:    Comprehensive review of systems including constitutional, eyes, ears, nose, mouth, throat, cardiovascular, GI, GU, musculoskeletal, integumentary, respiratory, neurologic, psychiatric, and endocrine is negative other than what is mentioned already in the history of present illness    Physical Exam:     Filed Vitals:    09/13/12 1043   BP: 134/65   Pulse: 79   Temp: 97.4 F (36.3 C)   Resp: 20   SpO2: 96%     Temp (24hrs), Avg:96.5 F (35.8 C), Min:95.6 F (35.3 C), Max:97.4 F (36.3 C)      Intake and Output Summary (Last 24 hours) at Date Time    Intake/Output  Summary (Last 24 hours) at 09/13/12 1238  Last data filed at 09/13/12 0558   Gross per 24 hour   Intake      0 ml   Output   1300 ml   Net  -1300 ml       GENERAL: Patient is in no acute distress   HEENT: No scleral icterus or conjunctival pallor, moist mucous membranes   NECK: No jugular venous distention or thyromegaly, normal carotid upstrokes without bruits   CARDIAC: Normal apical impulse, regular rate and rhythm, with normal S1 and S2, and no 2/6 hsm apex.  No rubs or gallops .  Paradoxical s2 split.  CHEST: Clear to auscultation bilaterally, normal respiratory effort  ABDOMEN: No abdominal bruits, masses, or hepatosplenomegaly, nontender, non-distended, good bowel sounds   EXTREMITIES: No clubbing, cyanosis, or edema, 2+ DP, PT, and femoral pulses bilaterally without bruits  SKIN: No rash or jaundice   NEUROLOGIC: Alert and oriented to time, place and person, normal mood and affect   MUSCULOSKELETAL: Normal muscle strength and tone.      Labs Reviewed:     Lab 09/13/12 1141   CK 173*   TROPI 0.01   TROPT --   CKMBINDEX --     No results found for this basename: DIG in the last 168 hours  No results found for this basename: CHOL:3,TRIG:3,HDL:3,LDL:3 in the last 168 hours    Lab 09/13/12 0308   BILITOTAL 0.3   BILIDIRECT 0.1   PROT 6.7   ALB 3.8   ALT 12   AST 18     No results found for this basename: MG in the last 168 hours    Lab 09/13/12 1141   PT 13.5   INR 1.0   PTT --       Lab 09/13/12 0308  WBC 11.00*   HGB 11.7*   HCT 35.6*   PLT 276       Lab 09/13/12 0308   NA 140   K 4.3   CL 110*   CO2 20*   BUN 28.0*   CREAT 1.2*   EGFR 44.7   GLU 184*   CA 9.1     Ekg:  Nsr. lbbb.      Radiology   Radiological Procedure reviewed.      chest X-ray  Assessment:    CHF - mild.  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  Then discontinue it.   Start aldactone 25  mg po daily for volume control and cmp care   Continue carvedilol and lisinopril   [facility]ful [place] home tomorrow morning.            Signed by: Wynona Canes, MD    [facility]  NP Spectralink [phone] (8am-5pm)  MD Spectralink [phone] or [phone] (8am-5pm)  Arrhythmia Spectralink [phone] (8am-4:30pm)  After hours, non urgent consult line [phone]  After Hours, urgent consults [phone]

## 2012-09-14 LAB — URINALYSIS WITH MICROSCOPIC
Bilirubin, UA: NEGATIVE
Glucose, UA: NEGATIVE
Ketones UA: NEGATIVE
Nitrite, UA: POSITIVE — AB
Protein, UR: 30 — AB
Specific Gravity UA: 1.024 (ref 1.001–1.035)
Urine pH: 5.5 (ref 5.0–8.0)
Urobilinogen, UA: NORMAL mg/dL

## 2012-09-14 LAB — UREA NITROGEN, URINE: Urine Urea Nitrogen Random: 659 mg/dL

## 2012-09-14 LAB — BASIC METABOLIC PANEL
BUN: 32 mg/dL — ABNORMAL HIGH (ref 7.0–19.0)
BUN: 38 mg/dL — ABNORMAL HIGH (ref 7.0–19.0)
CO2: 22 (ref 22–29)
CO2: 23 (ref 22–29)
Calcium: 9.5 mg/dL (ref 8.5–10.5)
Calcium: 9.1 mg/dL (ref 8.5–10.5)
Chloride: 102 (ref 98–107)
Chloride: 99 (ref 98–107)
Creatinine: 1.7 mg/dL — ABNORMAL HIGH (ref 0.6–1.0)
Creatinine: 1.9 mg/dL — ABNORMAL HIGH (ref 0.6–1.0)
Glucose: 238 mg/dL — ABNORMAL HIGH (ref 70–100)
Glucose: 197 mg/dL — ABNORMAL HIGH (ref 70–100)
Potassium: 4.3 (ref 3.5–5.1)
Potassium: 4.4 (ref 3.5–5.1)
Sodium: 136 (ref 136–145)
Sodium: 134 — ABNORMAL LOW (ref 136–145)

## 2012-09-14 LAB — POCT GLUCOSE
Whole Blood Glucose POCT: 191 mg/dL — AB (ref 70–100)
Whole Blood Glucose POCT: 166 mg/dL — AB (ref 70–100)
Whole Blood Glucose POCT: 139 mg/dL — AB (ref 70–100)
Whole Blood Glucose POCT: 187 mg/dL — AB (ref 70–100)

## 2012-09-14 LAB — CBC
Hematocrit: 36.8 % — ABNORMAL LOW (ref 37.0–47.0)
Hgb: 11.9 g/dL — ABNORMAL LOW (ref 12.0–16.0)
MCH: 29.2 pg (ref 28.0–32.0)
MCHC: 32.3 g/dL (ref 32.0–36.0)
MCV: 90.4 fL (ref 80.0–100.0)
MPV: 10.3 fL (ref 9.4–12.3)
Nucleated RBC: 0 (ref 0–1)
Platelets: 292 (ref 140–400)
RBC: 4.07 — ABNORMAL LOW (ref 4.20–5.40)
RDW: 13 % (ref 12–15)
WBC: 10.42 (ref 3.50–10.80)

## 2012-09-14 LAB — GFR
EGFR: 29.9
EGFR: 26.3

## 2012-09-14 LAB — CREATININE, URINE, RANDOM: Urine Creatinine, Random: 198.1

## 2012-09-14 LAB — MAGNESIUM: Magnesium: 2.1 mg/dL (ref 1.6–2.6)

## 2012-09-14 LAB — CHLORIDE, URINE, RANDOM: Chloride, UR: <20

## 2012-09-14 LAB — SODIUM, URINE, RANDOM: Urine Sodium Random: 44

## 2012-09-14 MED ORDER — HEPARIN SODIUM (PORCINE) 5000 UNIT/ML IJ SOLN
5000.0000 [IU] | Freq: Three times a day (TID) | INTRAMUSCULAR | Status: DC
Start: 2012-09-14 — End: 2012-09-15
  Administered 2012-09-14 – 2012-09-15 (×2): 5000 [IU] via SUBCUTANEOUS
  Filled 2012-09-14 (×2): qty 1

## 2012-09-14 MED ORDER — SODIUM CHLORIDE 0.9 % IV MBP
1.00 g | INTRAVENOUS | Status: DC
Start: 2012-09-14 — End: 2012-09-15
  Administered 2012-09-14 – 2012-09-15 (×2): 1 g via INTRAVENOUS
  Filled 2012-09-14 (×2): qty 1000

## 2012-09-14 NOTE — Progress Notes (Addendum)
[place] [facility] PROGRESS NOTE  [facility]      Date Time: 09/14/2012 10:30 AM  Patient Name: Tara Perry, Tara Perry  Medical Record #:  [id number]  Account#:  [id number]  Admission Date:  09/13/2012         Patient Active Problem List   Diagnosis   . Peritonitis   . Viral cardiomyopathy   . Type II diabetes mellitus without mention of complication   . Gout synovitis   . Pulmonary edema   . Type 2 diabetes mellitus without complications   . SIRS (systemic inflammatory response syndrome)   . Viral cardiomyopathy   . Dyspnea   . CHF (congestive heart failure)       Subjective:   Denies chest pain, SOB or palpitations.    Assessment:      CHF - mild. 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    Recommendations:    Negative cardiac enzymes X 3    BNP only mildly elevated at 167  IV lasix given yesterday with good diuresis-now off lasix lisinopril and aldactone given BUN/Cr increase   Check BUN, CR on Monday , resume ACE I as outpatient  Continue carvedilol   Outpatient follow up with Dr Nash Shearer      Medications:      Scheduled Meds:         aspirin 81 mg Oral Daily   atorvastatin 10 mg Oral Daily   carvedilol 25 mg Oral Q12H SCH   famotidine 20 mg Oral Q12H SCH   [COMPLETED] furosemide 40 mg Intravenous BID   heparin (porcine) 5,000 Units Subcutaneous Q8H SCH   vitamin B-12 1,000 mcg Oral Daily   vitamins/minerals 1 tablet Oral Daily   [DISCONTINUED] atorvastatin 10 mg Oral Daily   [DISCONTINUED] enoxaparin 40 mg Subcutaneous Daily   [DISCONTINUED] furosemide 40 mg Intravenous BID   [DISCONTINUED] lisinopril 20 mg Oral Daily   [DISCONTINUED] lisinopril 20 mg Oral Daily   [DISCONTINUED] spironolactone 25 mg Oral Daily       Continuous Infusions:            Physical Exam:     Filed Vitals:    09/14/12 0920   BP: 108/56   Pulse: 75   Temp:    Resp:    SpO2:      Temp (24hrs), Avg:97 F (36.1 C), Min:95.8 F (35.4 C), Max:97.6 F (36.4  C)      Telemetry reviewed no changes.     Intake and Output Summary (Last 24 hours) at Date Time    Intake/Output Summary (Last 24 hours) at 09/14/12 1030  Last data filed at 09/13/12 2351   Gross per 24 hour   Intake    710 ml   Output   1380 ml   Net   -670 ml       General Appearance:  Breathing comfortable, no acute distress  Head:  normocephalic  Eyes:  EOM's intact  Neck:  No carotid bruit or jugular venous distension, brisk carotid upstroke  Lungs:  Clear to auscultation throughout, no wheezes, rhonchi or rales, good respiratory effort   Heart:  no S3, no S4, 2/6 HSM apex,  paradoical S2 split, PMI not displaced, no rub   Abdomen:  Soft, non-tender, positive bowel sounds, no hepatojugular reflux  Extremities:  No cyanosis, clubbing or edema  Pulses:  Equal pulses, 4/4 symmetric  Neurologic:  Alert and oriented x3, mood and affect normal    Labs:     Lab 09/13/12 1819 09/13/12 1141   CK 157 173*   TROPI 0.02 0.01   TROPT -- --   CKMBINDEX -- --     No results found for this basename: DIG in the last 168 hours    Lab 09/13/12 1141   CHOL 183   TRIG 121   HDL 48   LDL 111*       Lab 09/13/12 0308   BILITOTAL 0.3   BILIDIRECT 0.1   PROT 6.7   ALB 3.8   ALT 12   AST 18       Lab 09/14/12 0404   MG 2.1       Lab 09/13/12 1141   PT 13.5   INR 1.0   PTT --       Lab 09/14/12 0404 09/13/12 0308   WBC 10.42 11.00*   HGB 11.9* 11.7*   HCT 36.8* 35.6*   PLT 292 276       Lab 09/14/12 0404 09/13/12 0308   NA 136 140   K 4.3 4.3   CL 102 110*   CO2 22 20*   BUN 32.0* 28.0*   CREAT 1.7* 1.2*   EGFR 29.9 44.7   GLU 238* 184*   CA 9.5 9.1       Lab 09/13/12 1141   TSH 1.06   FREET3 --       .  Lab Results   Component Value Date    BNP 167* 09/13/2012        Imaging:   Radiological Procedure reviewed.        Signed by: Mosetta Pigeon, NP      Patient seen and examined.  My A/P as above.  Remarkably CHF rapidly resolved and in fact with 2 doses lasix became slightly volume depleted.  I recommend:    1.  [place] home today  2.   Continue coreg  3.  Resume lisinopril in 2 days  4.  Chem7 in 2 days  5.  Rx for furosemide 20 mg tabs - to use only as needed, up to once daily, if significant dyspnea.  6.  Visit with [place] in 7-10 days to reassess.  7.  Mosetta Pigeon, NP will help arrange above.    [facility]  NP Spectralink [phone] (8am-5pm)  MD Spectralink [phone] or [phone] (8am-5pm)  Arrhythmia Spectralink [phone] (8am-4:30pm)  After hours, non urgent consult line [phone]  After Hours, urgent consults [phone]

## 2012-09-14 NOTE — Progress Notes (Addendum)
MEDICINE PROGRESS NOTE    Date Time: 09/14/2012 8:12 AM  Patient Name: Tara Perry  Attending Physician: Verdene Lennert, MD  Team Contact Info: Team [id number]      Assessment:     Active Hospital Problems    Diagnosis   . Pulmonary edema   . Type 2 diabetes mellitus without complications   . SIRS (systemic inflammatory response syndrome)   . Viral cardiomyopathy   . Dyspnea   . CHF (congestive heart failure)       *Pulmonary edema   Active Problems:   Type 2 diabetes mellitus without complications   Viral cardiomyopathy   Dyspnea   CHF (congestive heart failure)       Plan:     # CHF:  - chronic, last admission 6 years ago. Seems to be secondary to viral etiology. Last episode 6 years ago, and states recent echo EF not changed, around 45%.   - Was diuresed with Lasix IV but today, Cr increased to 1.7 so d/c   - strict I and O   - Will also hold lisinopril and spironolactone  - F/U Cardiology recs    # AKI:   - Most likely from over-diuresis, will d/c IV lasix and lisinopril, spironolactone  - Urine lytes to calculate FENA and UA, Ucx    # DM: AC&HS glucose checks with SSI    # HTN: On lisinopril at home, will d/c today given AKI    # PPX: on subcutaneous heparin     # Dispo: once medically cleared        Safety Checklist:     DVT prophylaxis:  CHEST guideline (See page [id number]) Chemical   Foley: Not present   IVs:  Peripheral IV   PT/OT: Not needed   Daily CBC & or Chem ordered:  SHM/ABIM guidelines (see #5) Yes, due to clinical and lab instability   Reference for charges of common labs: CBC auto diff - $76  BMP - $99  Mg - $79    Active PICC Line / CVC Line / PIV Line / Drain / Airway / Intraosseous Line / Epidural Line / ART Line / Line / Wound / Pressure Ulcer / NG/OG Tube     **None**          Disposition:     Anticipated discharge needs: Home with no needs      Subjective     CC: SOB, CHF exacerbation    Interval History/24 hour events: No acute overnight events. No arrhythmia, no issues    HPI/Subjective:  Feels better today with no SOB, no orthopnea     Review of Systems:   Review of Systems - 11+ systems reviewed and found negative except for HPI      Physical Exam:       VITAL SIGNS PHYSICAL EXAM   Temp:  [95.8 F (35.4 C)-97.6 F (36.4 C)] 97 F (36.1 C)  Heart Rate:  [66-90] 66   Resp Rate:  [16-20] 18   BP: (102-140)/(55-75) 102/55 mmHg  Telemetry:        Intake/Output Summary (Last 24 hours) at 09/14/12 0812  Last data filed at 09/13/12 2351   Gross per 24 hour   Intake    710 ml   Output   1380 ml   Net   -670 ml    Physical Exam  Gen: NAD, AAx3  HEENT: MMM, non-icteric sclera   Cardiac: RRR, no M/R/G  Lungs: CTAB, GBAE  Abdomen: soft,  ND, NT   Ext: No peripheral edema  Neuro: AAx3, no focal deficits  Skin: no lesions or breaks       Meds:     Medications were reviewed.    Labs:       Labs (last 72 hours):      Lab 09/14/12 0404 09/13/12 0308   WBC 10.42 11.00*   HGB 11.9* 11.7*   HCT 36.8* 35.6*   PLT 292 276         Lab 09/13/12 1141   PT 13.5   INR 1.0   PTT --      Lab 09/14/12 0404 09/13/12 0308   NA 136 140   K 4.3 4.3   CL 102 110*   CO2 22 20*   BUN 32.0* 28.0*   CREAT 1.7* 1.2*   CA 9.5 9.1   ALB -- 3.8   PROT -- 6.7   BILITOTAL -- 0.3   ALKPHOS -- 76   ALT -- 12   AST -- 18   GLU 238* 184*                 Microbiology, reviewed and are significant for:  None      Imaging personally reviewed, including:   CXR: with fluid overload and pulmonary edema       Case discussed with: Dr. Clydene Fake    Signed by: Tara Him, MD    Attending Attestation:     I have seen and personally examined the patient.  I agree with the findings and exam as documented by Dr. [name] with following caveats: Denies SOB, urinary urgency or dysuria.  Pt wanting to go home today.    # AKI most likely from overdiuresis  # Systolic HF EF 40-45%  # UA+ but pt completely asymptomatic  # NICM  # DM  # HTN      Plan:  -hold lasix, aldactone and lisinopril given the aki  -will be liberal with fluid intake  -pt wanting to go home.   Will repeat BMP later this afternoon as well as on Monday with cardiology  -restart lisinopril once creat improves  -hold abx since pt asymptomatic.  F/u urine culture  -most likely d/c home later today since pt will have follow up on Monday       Disposition:     Today's date: 09/14/2012  Anticipated medical stability for discharge: 7/5  Service status: obs  Reason for ongoing hospitalization: Tara Perry  Anticipated discharge needs: none    Verdene Lennert, MD

## 2012-09-14 NOTE — Plan of Care (Signed)
IV ABX administered. Vitals WNL. No complaints of pain or SOB. Encouraged PO intake. Family members at bedside. Will continue to monitor.

## 2012-09-14 NOTE — Progress Notes (Signed)
Pt A&OX4, VSS, denies CP/SOB, po intake encouraged, I/O monitoring, ambulating in hallways, family at beside, will continue to monitor.

## 2012-09-15 LAB — BASIC METABOLIC PANEL
BUN: 37 mg/dL — ABNORMAL HIGH (ref 7.0–19.0)
CO2: 22 (ref 22–29)
Calcium: 9.5 mg/dL (ref 8.5–10.5)
Chloride: 103 (ref 98–107)
Creatinine: 1.6 mg/dL — ABNORMAL HIGH (ref 0.6–1.0)
Glucose: 181 mg/dL — ABNORMAL HIGH (ref 70–100)
Potassium: 4.2 (ref 3.5–5.1)
Sodium: 136 (ref 136–145)

## 2012-09-15 LAB — CBC
Hematocrit: 35.7 % — ABNORMAL LOW (ref 37.0–47.0)
Hgb: 11.8 g/dL — ABNORMAL LOW (ref 12.0–16.0)
MCH: 29.4 pg (ref 28.0–32.0)
MCHC: 33.1 g/dL (ref 32.0–36.0)
MCV: 88.8 fL (ref 80.0–100.0)
MPV: 10.2 fL (ref 9.4–12.3)
Nucleated RBC: 0 (ref 0–1)
Platelets: 284 (ref 140–400)
RBC: 4.02 — ABNORMAL LOW (ref 4.20–5.40)
RDW: 12 % (ref 12–15)
WBC: 10.85 — ABNORMAL HIGH (ref 3.50–10.80)

## 2012-09-15 LAB — GFR: EGFR: 32.1

## 2012-09-15 LAB — POCT GLUCOSE: Whole Blood Glucose POCT: 180 mg/dL — AB (ref 70–100)

## 2012-09-15 LAB — MAGNESIUM: Magnesium: 2.4 mg/dL (ref 1.6–2.6)

## 2012-09-15 MED ORDER — FUROSEMIDE 20 MG PO TABS
20.0000 mg | ORAL_TABLET | ORAL | Status: DC | PRN
Start: 2012-09-15 — End: 2018-08-27

## 2012-09-15 MED ORDER — CIPROFLOXACIN HCL 500 MG PO TABS
500.00 mg | ORAL_TABLET | Freq: Two times a day (BID) | ORAL | Status: DC
Start: 2012-09-15 — End: 2012-09-15
  Filled 2012-09-15 (×2): qty 1

## 2012-09-15 MED ORDER — CIPROFLOXACIN HCL 500 MG PO TABS
500.0000 mg | ORAL_TABLET | Freq: Two times a day (BID) | ORAL | Status: DC
Start: 2012-09-15 — End: 2017-06-28

## 2012-09-15 MED ORDER — DIPHENHYDRAMINE HCL 25 MG PO CAPS
25.00 mg | ORAL_CAPSULE | Freq: Four times a day (QID) | ORAL | Status: DC | PRN
Start: 2012-09-15 — End: 2012-09-15
  Administered 2012-09-15: 25 mg via ORAL
  Filled 2012-09-15: qty 1

## 2012-09-15 MED ORDER — CIPROFLOXACIN HCL 500 MG PO TABS
500.0000 mg | ORAL_TABLET | Freq: Two times a day (BID) | ORAL | Status: DC
Start: 2012-09-15 — End: 2012-09-15

## 2012-09-15 NOTE — Discharge Summary (Signed)
MEDICINE DISCHARGE SUMMARY    Date Time: 09/15/2012 9:22 AM  Patient Name: Tara Perry  Attending Physician: Verdene Lennert, MD  Primary Care Physician: Kennon Holter, MD    Date of Admission: 09/13/2012  Date of Discharge: 09/15/2012    Discharge Diagnoses:     Principal Problem:   *Pulmonary edema  Active Problems:   Type 2 diabetes mellitus without complications   SIRS (systemic inflammatory response syndrome)   Viral cardiomyopathy   Dyspnea   CHF (congestive heart failure)  Resolved Problems:   * No resolved hospital problems. *       Disposition:     home with family    Pending Results, Recommendations & Instructions to providers after discharge:     1. Micro / Labs / Path pending: Urine culture   2. Wound Care Instructions: none  3. Date of completion for antibiotics or other medications: 09/19/2012      Recent Labs - Last 2:         Lab 09/15/12 0205 09/14/12 0404   WBC 10.85* 10.42   HGB 11.8* 11.9*   HCT 35.7* 36.8*   PLT 284 292         Lab 09/13/12 1141   PT 13.5   INR 1.0   PTT --       Lab 09/13/12 0308   ALKPHOS 76   BILITOTAL 0.3   BILIDIRECT 0.1   PROT 6.7   ALB 3.8   ALT 12   AST 18        Lab 09/15/12 0205 09/14/12 1418 09/14/12 0404   NA 136 134* --   K 4.2 4.4 --   CL 103 99 --   CO2 22 23 --   BUN 37.0* 38.0* --   GLU 181* 197* --   CA 9.5 9.1 --   MG 2.4 -- 2.1   PHOS -- -- --         Lab 09/13/12 1141   CHOL 183   TRIG 121   HDL 48   LDL 111*       Lab 09/13/12 1141   TSH 1.06   FREET3 --            Procedures/Radiology performed:     XR CHEST AP PORTABLE  XR CHEST 2 VIEWS       Hospital Course:     Reason for admission/ HPI:   CIERRIA Tara Perry is a 67 y.o. female who presents to the hospital with acute shortness of breath. States sudden onset which woke her up. No accompanying chest pain, nausea, diaphoresis. On presentation to the emergency room, laboratory evaluation showed a white blood cell count 11, chloride 110, bun 28, creatinine 1.2, glucose 184, gfr 44.7, ck 167, troponin negative x 1.  Chest xray consistent with pulmonary venous congestion. States similar episode 6 years ago, was diagnosed with viral cardiomyopathy. Last known EF then she thinks is 45 %. Recent outpatient echocardiogram last month. In the emergency room, patient was given Aspirin 81 mg by mouth x 1, Lasix 40 mg IV x 1. States feels much better after treatment in the emergency room.       Hospital Course:  Pt was admitted and started on IV lasix for diuresis. Cardiology consulted and added spironolactone. She diuresed well and was no longer SOB. She however, developed AKI with a rise in Cr to 1.9. Lasix, spironolactone and lisinopril held and Cr started coming down, so can be discharged today.  She was also found to have a positive UA.  She had mild symptoms of a UTI.  She was treated with ceftriaxone and transitioned to cipro.  Urine cx reveals gram neg rods but still awaiting sensitivities.  Will need to f/u official culture results.          Discharge Day Exam:  Temp:  [96 F (35.6 C)-98.2 F (36.8 C)] 96.5 F (35.8 C)  Heart Rate:  [68-84] 71   Resp Rate:  [16-18] 18   BP: (109-129)/(55-62) 129/62 mmHg      Gen: NAD, AAx3   HEENT: MMM, non-icteric sclera   Cardiac: RRR, no M/R/G   Lungs: CTAB, GBAE   Abdomen: soft, ND, NT   Ext: No peripheral edema   Neuro: AAx3, no focal deficits   Skin: no lesions or breaks       Consultations:     Treatment Team: Attending Provider: Verdene Lennert, MD; Consulting Physician: Eldridge Abrahams, MD; Resident: Erlene Senters, MD; Resident: Forrestine Him, MD; Consulting Physician: Donnie Mesa, MD; Registered Nurse: Lorelee Market, RN; Registered Nurse: Dennison Mascot, RN    Discharge Condition:   Stable    Discharge Instructions & Follow Up Plan for Patient:     Diet:Cardiac, low salt and low fat    Activity/Weight Bearing Status:Activity as tolerated     Patient was instructed to follow up with:   Primary Care Doctor Moles, Neomia Glass, MD in  One week & the following Consultants:  1. Your  cardiologist (Dr. Nash Shearer) in two days to repeat labs    Discharge Code Status:full  Patient Emergency Contact:daughter    Complete instructions and follow up are in the patient's After Visit Summary    Minutes spent coordinating discharge and reviewing discharge plan:30 minutes    Discharge Medications:        Medication List       As of 09/15/2012  9:22 AM      START taking these medications           ciprofloxacin 500 MG tablet    Commonly known as: CIPRO    Take 1 tablet (500 mg total) by mouth every 12 (twelve) hours.        furosemide 20 MG tablet    Commonly known as: LASIX    Take 1 tablet (20 mg total) by mouth as needed (for shortness of breath).         CONTINUE taking these medications           atorvastatin 10 MG tablet    Commonly known as: LIPITOR        CENTRUM PO        * carvedilol 25 MG tablet    Commonly known as: COREG        * COREG PO        estradiol 25 MCG vaginal tablet    Commonly known as: VAGIFEM        glimepiride 2 MG tablet    Commonly known as: AMARYL        lisinopril 20 MG tablet    Commonly known as: PRINIVIL,ZESTRIL        metFORMIN 500 MG tablet    Commonly known as: GLUCOPHAGE        sitaGLIPtin 100 MG tablet    Commonly known as: JANUVIA        vitamin B-12 100 MCG tablet    Commonly known as: CYANOCOBALAMIN       *  Notice: This list has 2 medication(s) that are the same as other medications prescribed for you. Read the directions carefully, and ask your doctor or other care provider to review them with you.            Where to get your medications     These are the prescriptions that you need to pick up.    You may get these medications from any pharmacy.           ciprofloxacin 500 MG tablet    furosemide 20 MG tablet                   (FYI: you must refresh the link after final D/C Med reconciliation)    Immunizations provided:        [facility]  Department of Medicine  P: [phone]  F: [phone]    Signed by: Forrestine Him,  MD    CC: Moles, Neomia Glass, MD    Attending Attestation:     I have seen and personally examined the patient.  I agree with the findings and exam as documented by Dr. Addison Bailey with following caveats: Denies SOB, CP.  Feels like she has a UTI.      # AKI most likely from overdiuresis   # Systolic HF EF 40-45%   # UTI with GNR   # NICM   # DM   # HTN      Plan:  -repeat BMP in 2 days  -hold lisinopril until creat normalizes  -lasix prn for SOb  -change to cipro and f/u urine culture  -d/c home  -f/u cards n 2 days and with your PCP in  1 week      Disposition:     Today's date: 09/15/2012  Anticipated medical stability for discharge: 7/6  Service status: inpt  Reason for ongoing hospitalization: AKI, UTI  Anticipated discharge needs: none    Verdene Lennert, MD

## 2012-09-15 NOTE — Discharge Instructions (Addendum)
Please do NOT start your lisinopril until Tuesday 09/17/2012  Try to follow up with your cardiologist on Monday or Tuesday 7/7 or 09/17/2012 to repeat your blood work      Pulmonary Edema  Your healthcare provider has told you that you have pulmonary edema. Read on to learn more about pulmonary edema and how it can be treated.  What Is Pulmonary Edema?      Pulmonary edema is fluid in the air sacs (alveoli) in the lungs.   Pulmonary edema occurs when the air sacs (alveoli) in your lungs fill with fluid. The fluid buildup makes it hard for the lungs to do their job, including getting oxygen from the air you breathe. This can make it difficult to breathe. The most common cause of pulmonary edema is heart failure. When the heart doesn't work properly, it can cause pressure to rise in the veins (blood vessels) of the lungs. As pressure builds, fluid fills the alveoli. The extra fluid prevents oxygen from moving through the lungs properly. But heart failure isn't the only cause of pulmonary edema. Damage to the lungs or kidney failure can also cause fluid to fill the lungs. And in some cases, living or exercising at high altitudes can lead to fluid buildup in the lungs.  How Is Pulmonary Edema Diagnosed?  Your healthcare provider examines you and asks about your health history. You may also have one or more of the following:   Blood teststo take samples of blood.   Imaging teststo take detailed pictures of inside the body. These may include a chest x-ray and ultrasound.   Electrocardiography (ECG or EKG) to test how well the heart is functioning.  How Is Pulmonary Edema Treated?  Treatment usually depends on what's causing the edema. For instance, if it's due to heart failure, treating the heart condition will treat the edema. Treatment can also ease symptoms. Therapy often includes the following:   Oxygen.This may be given through a mask that goes over the nose. It may be given through a small tube that sits under  the nose. Or it may be given through a tube that's placed into the windpipe (trachea). A ventilator--often called a breathing machine--may also be used.   Medications.These may include diuretics ("water pills") to help relieve the body of extra fluid. The fluidpasses out of the body as urine. Medications to treat the heart may also be given. These can help improve how the heart functions, which helps reduce fluid buildup in the lungs.  What Are the Long-term Concerns?  If treated right away, pulmonary edema can be improved. It may even be cured. But, in some cases, ongoing treatment is needed to help control the problem. This mayrequire having procedures or taking medications for months or years. In some cases, long-term use of oxygen or breathing equipment is needed. This can lead to complications such as damage to lung tissue. Your healthcare provider can tell you more if needed.  Call the healthcare provider right away if you have any of the following:   Chest pain (call 911)   Severe trouble breathing (call 911)   Coughing up blood (call 911)   Skin turns blue (call 911)   Unusual or irregular heartbeat   Unable to speak full sentences before running out of breath   Sweating more than usual    [address], [place], [place] [address]. All rights reserved. This information is not intended as a substitute for professional medical care. Always  follow your healthcare professional's instructions.

## 2012-09-15 NOTE — Progress Notes (Signed)
Pt remained fall free while in the unit,during her hospital stay. Discharge instructions discussed w/ Pt and Dgtr at bedside. New Med'n script handed to Pt. Needs anticipated.VSS on room air. No S/s of acute distress. D/c tele monitoring and IV access too. Denies of pain nor discomfort. Dgtr at bedside will take her home.

## 2012-09-15 NOTE — Progress Notes (Signed)
Pt c/o of itchiness to forearms and chest, states she might have a sensitivity to the tape, she also c/o itchiness to perineal area. RN paged on call hospitalist at [phone], awaiting call back

## 2012-09-15 NOTE — Progress Notes (Signed)
Received verbal order from on call residents [phone] for Benadryl 25mg  PO Q6H PRN for itchiness.

## 2012-09-16 LAB — ECG 12-LEAD
Atrial Rate: 73 {beats}/min
P Axis: 75 degrees
P-R Interval: 172 ms
Q-T Interval: 466 ms
QRS Duration: 158 ms
QTC Calculation (Bezet): 513 ms
R Axis: -52 degrees
T Axis: 71 degrees
Ventricular Rate: 73 {beats}/min

## 2013-01-11 HISTORY — PX: SMALL INTESTINE SURGERY: SHX150

## 2015-03-18 ENCOUNTER — Ambulatory Visit (INDEPENDENT_AMBULATORY_CARE_PROVIDER_SITE_OTHER): Payer: Self-pay | Admitting: Cardiovascular Disease

## 2015-09-07 ENCOUNTER — Other Ambulatory Visit: Payer: Self-pay | Admitting: Cardiovascular Disease

## 2015-09-07 DIAGNOSIS — I340 Nonrheumatic mitral (valve) insufficiency: Secondary | ICD-10-CM

## 2015-09-13 ENCOUNTER — Ambulatory Visit (INDEPENDENT_AMBULATORY_CARE_PROVIDER_SITE_OTHER): Payer: Self-pay

## 2015-09-13 ENCOUNTER — Other Ambulatory Visit (INDEPENDENT_AMBULATORY_CARE_PROVIDER_SITE_OTHER): Payer: Self-pay

## 2015-09-13 ENCOUNTER — Ambulatory Visit
Admission: RE | Admit: 2015-09-13 | Discharge: 2015-09-13 | Disposition: A | Discharge status: Home / Self Care | Payer: Medicare Other | Source: Ambulatory Visit | Attending: Cardiovascular Disease | Admitting: Cardiovascular Disease

## 2015-09-13 DIAGNOSIS — I340 Nonrheumatic mitral (valve) insufficiency: Secondary | ICD-10-CM

## 2015-09-21 LAB — VAHRT HISTORIC LVEF: Ejection Fraction: 25 %

## 2015-09-22 ENCOUNTER — Ambulatory Visit (INDEPENDENT_AMBULATORY_CARE_PROVIDER_SITE_OTHER): Payer: Self-pay | Admitting: Cardiovascular Disease

## 2015-09-30 ENCOUNTER — Other Ambulatory Visit: Payer: Self-pay | Admitting: "Endocrinology

## 2015-10-06 ENCOUNTER — Ambulatory Visit (INDEPENDENT_AMBULATORY_CARE_PROVIDER_SITE_OTHER): Payer: Self-pay | Admitting: Adult Health

## 2015-10-21 ENCOUNTER — Ambulatory Visit (INDEPENDENT_AMBULATORY_CARE_PROVIDER_SITE_OTHER): Payer: Self-pay | Admitting: Adult Health

## 2015-12-03 ENCOUNTER — Other Ambulatory Visit: Payer: Self-pay | Admitting: Adult Health

## 2015-12-03 DIAGNOSIS — I5022 Chronic systolic (congestive) heart failure: Secondary | ICD-10-CM

## 2015-12-14 ENCOUNTER — Ambulatory Visit
Admission: RE | Admit: 2015-12-14 | Discharge: 2015-12-14 | Disposition: A | Discharge status: Home / Self Care | Payer: Medicare Other | Source: Ambulatory Visit | Attending: Adult Health | Admitting: Adult Health

## 2015-12-14 ENCOUNTER — Other Ambulatory Visit (INDEPENDENT_AMBULATORY_CARE_PROVIDER_SITE_OTHER): Payer: Self-pay

## 2015-12-14 ENCOUNTER — Ambulatory Visit (INDEPENDENT_AMBULATORY_CARE_PROVIDER_SITE_OTHER): Payer: Self-pay

## 2015-12-14 DIAGNOSIS — I447 Left bundle-branch block, unspecified: Secondary | ICD-10-CM | POA: Insufficient documentation

## 2015-12-14 DIAGNOSIS — I5022 Chronic systolic (congestive) heart failure: Secondary | ICD-10-CM | POA: Insufficient documentation

## 2015-12-14 DIAGNOSIS — I517 Cardiomegaly: Secondary | ICD-10-CM | POA: Insufficient documentation

## 2015-12-14 DIAGNOSIS — I5189 Other ill-defined heart diseases: Secondary | ICD-10-CM | POA: Insufficient documentation

## 2015-12-23 LAB — VAHRT HISTORIC LVEF: Ejection Fraction: 40 %

## 2015-12-24 ENCOUNTER — Ambulatory Visit (INDEPENDENT_AMBULATORY_CARE_PROVIDER_SITE_OTHER): Payer: Self-pay | Admitting: Cardiovascular Disease

## 2015-12-27 ENCOUNTER — Ambulatory Visit (INDEPENDENT_AMBULATORY_CARE_PROVIDER_SITE_OTHER): Payer: Self-pay | Admitting: Nurse Practitioner

## 2016-01-17 ENCOUNTER — Ambulatory Visit (INDEPENDENT_AMBULATORY_CARE_PROVIDER_SITE_OTHER): Payer: Self-pay | Admitting: Nurse Practitioner

## 2016-01-25 ENCOUNTER — Ambulatory Visit (INDEPENDENT_AMBULATORY_CARE_PROVIDER_SITE_OTHER): Payer: Self-pay | Admitting: Nurse Practitioner

## 2016-01-31 ENCOUNTER — Ambulatory Visit (INDEPENDENT_AMBULATORY_CARE_PROVIDER_SITE_OTHER): Payer: Self-pay | Admitting: Nurse Practitioner

## 2016-02-15 ENCOUNTER — Ambulatory Visit (INDEPENDENT_AMBULATORY_CARE_PROVIDER_SITE_OTHER): Payer: Self-pay

## 2016-03-31 ENCOUNTER — Ambulatory Visit (INDEPENDENT_AMBULATORY_CARE_PROVIDER_SITE_OTHER): Payer: Self-pay | Admitting: Adult Health

## 2016-05-30 ENCOUNTER — Ambulatory Visit (INDEPENDENT_AMBULATORY_CARE_PROVIDER_SITE_OTHER): Payer: Self-pay

## 2016-08-11 ENCOUNTER — Ambulatory Visit (INDEPENDENT_AMBULATORY_CARE_PROVIDER_SITE_OTHER): Payer: Self-pay | Admitting: Cardiovascular Disease

## 2016-09-27 ENCOUNTER — Ambulatory Visit (INDEPENDENT_AMBULATORY_CARE_PROVIDER_SITE_OTHER): Payer: Self-pay | Admitting: Nurse Practitioner

## 2016-12-29 ENCOUNTER — Emergency Department: Payer: Medicare Other

## 2016-12-29 ENCOUNTER — Emergency Department
Admission: EM | Admit: 2016-12-29 | Discharge: 2016-12-29 | Disposition: A | Discharge status: Home / Self Care | Payer: Medicare Other | Attending: Emergency Medicine | Admitting: Emergency Medicine

## 2016-12-29 DIAGNOSIS — Z79899 Other long term (current) drug therapy: Secondary | ICD-10-CM | POA: Insufficient documentation

## 2016-12-29 DIAGNOSIS — I509 Heart failure, unspecified: Secondary | ICD-10-CM | POA: Insufficient documentation

## 2016-12-29 DIAGNOSIS — I110 Hypertensive heart disease with heart failure: Secondary | ICD-10-CM | POA: Insufficient documentation

## 2016-12-29 DIAGNOSIS — Z7984 Long term (current) use of oral hypoglycemic drugs: Secondary | ICD-10-CM | POA: Insufficient documentation

## 2016-12-29 DIAGNOSIS — M1990 Unspecified osteoarthritis, unspecified site: Secondary | ICD-10-CM | POA: Insufficient documentation

## 2016-12-29 DIAGNOSIS — G459 Transient cerebral ischemic attack, unspecified: Secondary | ICD-10-CM

## 2016-12-29 DIAGNOSIS — E119 Type 2 diabetes mellitus without complications: Secondary | ICD-10-CM | POA: Insufficient documentation

## 2016-12-29 DIAGNOSIS — M79605 Pain in left leg: Secondary | ICD-10-CM | POA: Insufficient documentation

## 2016-12-29 DIAGNOSIS — E7800 Pure hypercholesterolemia, unspecified: Secondary | ICD-10-CM | POA: Insufficient documentation

## 2016-12-29 HISTORY — DX: Heart failure, unspecified: I50.9

## 2016-12-29 LAB — CBC AND DIFFERENTIAL
Absolute NRBC: 0 10*3/uL
Basophils Absolute Automated: 0.05 10*3/uL (ref 0.00–0.20)
Basophils Automated: 0.5 %
Eosinophils Absolute Automated: 0.22 10*3/uL (ref 0.00–0.70)
Eosinophils Automated: 2.3 %
Hematocrit: 40.1 % (ref 37.0–47.0)
Hgb: 13.2 g/dL (ref 12.0–16.0)
Immature Granulocytes Absolute: 0.05 10*3/uL
Immature Granulocytes: 0.5 %
Lymphocytes Absolute Automated: 1.91 10*3/uL (ref 0.50–4.40)
Lymphocytes Automated: 20.1 %
MCH: 29.3 pg (ref 28.0–32.0)
MCHC: 32.9 g/dL (ref 32.0–36.0)
MCV: 88.9 fL (ref 80.0–100.0)
MPV: 10.3 fL (ref 9.4–12.3)
Monocytes Absolute Automated: 1 10*3/uL (ref 0.00–1.20)
Monocytes: 10.5 %
Neutrophils Absolute: 6.29 10*3/uL (ref 1.80–8.10)
Neutrophils: 66.1 %
Nucleated RBC: 0 /100 WBC (ref 0.0–1.0)
Platelets: 249 10*3/uL (ref 140–400)
RBC: 4.51 10*6/uL (ref 4.20–5.40)
RDW: 12 % (ref 12–15)
WBC: 9.52 10*3/uL (ref 3.50–10.80)

## 2016-12-29 LAB — COMPREHENSIVE METABOLIC PANEL
ALT: 13 U/L (ref 0–55)
AST (SGOT): 19 U/L (ref 5–34)
Albumin/Globulin Ratio: 1.4 (ref 0.9–2.2)
Albumin: 4.3 g/dL (ref 3.5–5.0)
Alkaline Phosphatase: 87 U/L (ref 37–106)
BUN: 29 mg/dL — ABNORMAL HIGH (ref 7.0–19.0)
Bilirubin, Total: 0.3 mg/dL (ref 0.2–1.2)
CO2: 21 mEq/L — ABNORMAL LOW (ref 22–29)
Calcium: 9.7 mg/dL (ref 7.9–10.2)
Chloride: 107 mEq/L (ref 100–111)
Creatinine: 1.7 mg/dL — ABNORMAL HIGH (ref 0.6–1.0)
Globulin: 3.1 g/dL (ref 2.0–3.6)
Glucose: 222 mg/dL — ABNORMAL HIGH (ref 70–100)
Potassium: 4.7 mEq/L (ref 3.5–5.1)
Protein, Total: 7.4 g/dL (ref 6.0–8.3)
Sodium: 139 mEq/L (ref 136–145)

## 2016-12-29 LAB — PT AND APTT
PT INR: 0.9 (ref 0.9–1.1)
PT: 12.3 s — ABNORMAL LOW (ref 12.6–15.0)
PTT: 31 s (ref 23–37)

## 2016-12-29 LAB — GFR: EGFR: 29.5

## 2016-12-29 NOTE — Discharge Instructions (Signed)
Dear Tara Perry:    Thank you for choosing one of [facility] emergency departments.  I hope your visit today was EXCELLENT.    Specific instructions for your visit today:      TIA    You have been diagnosed with a Transient Ischemic Attack (TIA). This is also known as a "mini-stroke."    A TIA is caused when a small blood vessel in the brain is blocked by a blood clot or plaque (plaque is stuff that builds up inside arteries). The area does not get enough blood and oxygen. This is very similar to what happens in a stroke. However, with a TIA, the process reverses itself and the symptoms get better. A TIA can happen again.    Some patients with TIA are admitted to the hospital. Other patients are sent home and told to get more tests in the next 1-3 days.    It is very important to follow up with your regular doctor or referral doctor as directed by the medical staff here. You may have tests scheduled (ultrasound, MRI, etc.). If so, it is very important to have the tests done.    Some patients with TIAs are sent home. They are often put on medicines to keep the blood from clotting as easily as usual. These medicines are generally called "anti-platelet" drugs. They include aspirin and clopidogrel (Plavix). However, other drugs may also be prescribed. It is very important to take the medicines as directed. They can lower the risk of a TIA or stroke from happening again.    YOU SHOULD SEEK MEDICAL ATTENTION IMMEDIATELY, EITHER HERE OR AT THE NEAREST EMERGENCY DEPARTMENT, IF ANY OF THE FOLLOWING OCCURS:   Numbness (loss of feeling) or tingling (pins and needles feeling) in your arm, leg or face.   Any part of the body is weak or has paralysis (cannot move).   Coordination or balance problems.   Bad dizziness (spinning sensation).   You have trouble speaking or can't express yourself all of a sudden.   Trouble swallowing.   Change in how alert you generally are.   Severe  headache.                 If you do not continue to improve or your condition worsens, please contact your doctor or return immediately to the Emergency Department.    Sincerely,  Chopard, Alben Spittle, MD  Attending Emergency Physician  [facility] Emergency Department    OBTAINING A PRIMARY CARE APPOINTMENT    Primary care physicians (PCPs, also known as primary care doctors) are either internists or family medicine doctors. Both types of PCPs focus on health promotion, disease prevention, patient education and counseling, and treatment of acute and chronic medical conditions.    Call for an appointment with a primary care doctor.  Ask to see who is taking new patients.     [facility]  telephone:  [phone]  [email or web address]    For a pediatrician, call the [facility] referral line below.  You can also call to make an appointment at [facility] (except [employer] and [facility]):    [address]  [place], [place] [address]  [phone]    [facility]  Call [phone] (available 24 hours a day, 7 days a week) if you need any further referrals and we can help you find a primary care doctor or specialist.  Also, available online at:  [email or web address]  For more information regarding our services at [facility], please call the number above or visit the website [email or web address]    YOUR CONTACT INFORMATION  Before leaving please check with registration to make sure we have an up-to-date contact number.  You can call registration at [phone], Option 7 [facility] location) or [phone], Option 1 [facility] location) to update your information.  For questions about your hospital bill, please call [phone].  For questions about your Emergency Dept Physician bill please call [phone].      [place]  If you need help with health or social services, please call 2-1-1 for a free referral to  resources in your area.  2-1-1 is a free service connecting people with information on health insurance, free clinics, pregnancy, mental health, dental care, food assistance, housing, and substance abuse counseling.  Also, available online at:  http://www.211[place].org    MEDICAL RECORDS AND TESTS  Certain laboratory test results do not come back the same day, for example urine cultures.   We will contact you if other important findings are noted.  Radiology films are often reviewed again to ensure accuracy.  If there is any discrepancy, we will notify you.      Please call [phone] [facility] location) or [phone] ([place] location) to pick up a complimentary CD of any radiology studies performed.  If you or your doctor would like to request a copy of your medical records, please call [phone].      ORTHOPEDIC INJURY   Please know that significant injuries can exist even when an initial x-ray is read as normal or negative.  This can occur because some fractures (broken bones) are not initially visible on x-rays.  For this reason, close outpatient follow-up with your primary care doctor or bone specialist (orthopedist) is required.    MEDICATIONS AND FOLLOWUP  Please be aware that some prescription medications can cause drowsiness.  Use caution when driving or operating machinery.    The examination and treatment you have received in our Emergency Department is provided on an emergency basis, and is not intended to be a substitute for your primary care physician.  It is important that your doctor checks you again and that you report any new or remaining problems at that time.      24 HOUR PHARMACIES  Two nearby 24 hour pharmacies are:    [facility] [place] [place] [address]  [phone]    [facility]  [place], [place] [address]  [phone]      [place]  [facility])  Call to start or finish an application, compare  plans, enroll or ask a question.  [place]: [phone]  Web:  Healthcare.gov    Help Enrolling in [place]  [phone] (TOLL-FREE)  [phone] (TTY)  Web:  Http://www.coverva.org    Local Help Enrolling in the [place]  814-726-6548 (MAIN)  Email:  health-help@nvfs .org  Web:  http://lewis-perez.info/  Address:  [address] [place], [place] [address]    SEDATING MEDICATIONS  Sedating medications include strong pain medications (e.g. narcotics), muscle relaxers, benzodiazepines (used for anxiety and as muscle relaxers), Benadryl/diphenhydramine and other antihistamines for allergic reactions/itching, and other medications.  If you are unsure if you have received a sedating medication, please ask your physician or nurse.  If you received a sedating medication: DO NOT  drive a car. DO NOT operate machinery. DO NOT perform jobs where you need to be alert.  DO NOT drink alcoholic beverages while taking this medicine.     If you get dizzy, sit or lie down at the first signs. Be careful going up and down stairs.  Be extra careful to prevent falls.     Never give this medicine to others.     Keep this medicine out of reach of children.     Do not take or save old medicines. Throw them away when outdated.     Keep all medicines in a cool, dry place. DO NOT keep them in your bathroom medicine cabinet or in a cabinet above the stove.    MEDICATION REFILLS  Please be aware that we cannot refill any prescriptions through the ER. If you need further treatment from what is provided at your ER visit, please follow up with your primary care doctor or your pain management specialist.

## 2016-12-29 NOTE — ED Provider Notes (Signed)
[facility] EMERGENCY DEPARTMENT H&P      Visit date: 12/29/2016      CLINICAL SUMMARY           Diagnosis:    .     Final diagnoses:   TIA (transient ischemic attack)         MDM Notes:      Pt with headache and facial numbness transient x 2 this week  Possible cervical radiculopathy vs migraine vs tia  hct neg, labs non diagnostic  Offered admission for TIA given her RF, but pt prefers out pf follow up  Spoke with PMD on call for her doc who will help arrange furhter out pt work up.  Pt already taking ASA, overall has low ABCD2         Disposition:         Discharge         Discharge Prescriptions     None                      CLINICAL INFORMATION        HPI:      Chief Complaint: Headache  .    Tara Perry is a 71 y.o. female who presents with intermittent numbness to left side of face and head. First episode happened on Wednesday and last happened this afternoon. Patient reports numbness usually goes away in about 20 minutes. Patient reports hx of diabetes, arthritis, high cholesterol, hypertension. She takes her medication regularly for hypertension and cholesterol. Patient denies vision loss, speech problems, nausea, vomiting.     Patient also reports left groin and left leg pain past 4-5 days aggravated by walking. Describes pain as burning.     History obtained from: Patient          ROS:      Positive and negative ROS elements as per HPI.  All other systems reviewed and negative.      Physical Exam:      Pulse 98  BP 133/62  Resp 15  SpO2 100 %  Temp (!) 96.4 F (35.8 C)    Physical Exam   Constitutional: She is oriented to person, place, and time. She appears well-developed and well-nourished. No distress.   HENT:   Head: Normocephalic and atraumatic.   Mouth/Throat: Oropharynx is clear and moist.   Eyes: Pupils are equal, round, and reactive to light. Conjunctivae are normal. No scleral icterus.   Neck: Normal range of motion. Neck supple.   Cardiovascular: Normal  rate, regular rhythm, normal heart sounds and intact distal pulses.  Exam reveals no gallop and no friction rub.    No murmur heard.  Pulmonary/Chest: Effort normal and breath sounds normal. No respiratory distress. She has no wheezes. She has no rales.   Abdominal: Soft. Bowel sounds are normal. She exhibits no distension. There is no tenderness. There is no rebound and no guarding.   Musculoskeletal: Normal range of motion. She exhibits no edema or deformity.   Neurological: She is alert and oriented to person, place, and time. No cranial nerve deficit.   Full strength in all extremities, sensation intact throughout  F->N intact  H->S intact  Normal gait, normal tandem gait  No pronator  Nl Romberg      Skin: Skin is warm and dry. No rash noted.   Nursing note and vitals reviewed.                PAST HISTORY  Primary Care Provider: Kennon Holter, MD        PMH/PSH:    .     Past Medical History:   Diagnosis Date   . 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 Congestive heart failure    . Diabetes mellitus 1992   . Gout spondylitis    . Gout synovitis    . Gout synovitis        She has a past surgical history that includes Colonoscopy (2002) and Exploratory laparotomy.      Social/Family History:      She reports that she has never smoked. She has never used smokeless tobacco. She reports that she drinks alcohol. She reports that she does not use drugs.    Family History   Problem Relation Age of Onset   . Cancer Father 23        bladder         Listed Medications on Arrival:    .     Home Medications     Med List Status:  In Progress Set By: Domenic Schwab, RN at 12/29/2016  5:21 PM                atorvastatin (LIPITOR) 10 MG tablet     Take 10 mg by mouth daily.      Carvedilol (COREG PO)     Take by mouth.      carvedilol (COREG) 25 MG tablet     Take 25 mg by mouth 2 (two) times daily. Morning and nights     ciprofloxacin (CIPRO) 500 MG tablet     Take 1 tablet (500 mg total) by mouth  every 12 (twelve) hours.     estradiol (VAGIFEM) 25 MCG vaginal tablet     Place 25 mcg vaginally daily.     furosemide (LASIX) 20 MG tablet     Take 1 tablet (20 mg total) by mouth as needed (for shortness of breath).     glimepiride (AMARYL) 2 MG tablet     Take 2 mg by mouth daily. At night     lisinopril (PRINIVIL,ZESTRIL) 20 MG tablet     Take 20 mg by mouth daily.       metFORMIN (GLUCOPHAGE) 500 MG tablet     Take 500 mg by mouth 2 (two) times daily. Morning and night     Multiple Vitamins-Minerals (CENTRUM PO)     Take by mouth.       sitaGLIPtin (JANUVIA) 100 MG tablet     Take 100 mg by mouth daily. At night      vitamin B-12 (CYANOCOBALAMIN) 100 MCG tablet     Take 50 mcg by mouth daily.           Allergies: She is allergic to shellfish-derived products.            VISIT INFORMATION        Clinical Course in the ED:      18:50 Spoke with Dr. Francesco Sor and he agrees with plan for follow-up on Monday.           Medications Given in the ED:    .     ED Medication Orders     None            Procedures:      Procedures      Interpretations:      O2 sat-  saturation: 100 %; Oxygen use: room air; Interpretation: Normal    Monitor -         interpreted by me: sinus 80s  EKG -             interpreted by me: normal sinus at 87.  Perry bundle branch block, normal axis, no ectopy, no acute ST or T wave changes.               RESULTS        Lab Results:      Results     Procedure Component Value Units Date/Time    Comprehensive metabolic panel [[id number]]  (Abnormal) Collected:  12/29/16 1720    Specimen:  Blood Updated:  12/29/16 1802     Glucose 222 (H) mg/dL      BUN 40.9 (H) mg/dL      Creatinine 1.7 (H) mg/dL      Sodium 811 mEq/Perry      Potassium 4.7 mEq/Perry      Chloride 107 mEq/Perry      CO2 21 (Perry) mEq/Perry      Calcium 9.7 mg/dL      Protein, Total 7.4 g/dL      Albumin 4.3 g/dL      AST (SGOT) 19 U/Perry      ALT 13 U/Perry      Alkaline Phosphatase 87 U/Perry      Bilirubin, Total 0.3 mg/dL      Globulin 3.1 g/dL       Albumin/Globulin Ratio 1.4    GFR [[id number]] Collected:  12/29/16 1720     Updated:  12/29/16 1802     EGFR 29.5    CBC with differential [[id number]] Collected:  12/29/16 1720    Specimen:  Blood from Blood Updated:  12/29/16 1747     WBC 9.52 x10 3/uL      Hgb 13.2 g/dL      Hematocrit 46.9 %      Platelets 249 x10 3/uL      RBC 4.51 x10 6/uL      MCV 88.9 fL      MCH 29.3 pg      MCHC 32.9 g/dL      RDW 12 %      MPV 10.3 fL      Neutrophils 66.1 %      Lymphocytes Automated 20.1 %      Monocytes 10.5 %      Eosinophils Automated 2.3 %      Basophils Automated 0.5 %      Immature Granulocyte 0.5 %      Nucleated RBC 0.0 /100 WBC      Neutrophils Absolute 6.29 x10 3/uL      Abs Lymph Automated 1.91 x10 3/uL      Abs Mono Automated 1.00 x10 3/uL      Abs Eos Automated 0.22 x10 3/uL      Absolute Baso Automated 0.05 x10 3/uL      Absolute Immature Granulocyte 0.05 x10 3/uL      Absolute NRBC 0.00 x10 3/uL     PT/APTT [[id number]]  (Abnormal) Collected:  12/29/16 1720     Updated:  12/29/16 1745     PT 12.3 (Perry) sec      PT INR 0.9     PT Anticoag. Given Within 48 hrs. None     PTT 31 sec               Radiology Results:  CT Head WO Contrast   Final Result    No acute intracranial abnormality is seen.              Leandro Reasoner, MD    12/29/2016 4:17 PM                  Scribe Attestation:      I was acting as a Neurosurgeon for Randolph Bing, MD on Tara Perry  Treatment Team: Scribe: Roderic Ovens     I am the first provider for this patient and I personally performed the services documented. Treatment Team: Scribe: [place], Lorin Picket is scribing for me on Tara Perry,Tara Perry. This note and the patient instructions accurately reflect work and decisions made by me.  Randolph Bing, MD            Randolph Bing, MD  12/31/16 2219

## 2016-12-29 NOTE — ED Notes (Signed)
Marisue Ivan, Stroke RN made aware.

## 2016-12-29 NOTE — Acute Ischemic Stroke Note (Signed)
Background:   71 y.o. female presents to the emergency department with complaint(s) of Headache       Initial NIHSS completed at 1726.    Last known well is 1 week ago.  Patient states she has had 3 episodes of L side facial numbness over the last week, each episode lasting 15-20 minutes, self resolving.  On exam patient is alert & oriented x4, follows commands and answers questions appropriately. No gaze preference or visual field loss noted, no facial droop noted; pt states that numbness to L side of face has resolved. BUE and BLE are equal in strength, sensation intact. No limb ataxia noted, no dysarthria noted.       Past Medical History:   Diagnosis Date   . 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 Congestive heart failure    . Diabetes mellitus 1992   . Gout spondylitis    . Gout synovitis    . Gout synovitis         Plan:  NIHSS = 0, mRS: 0 = No symptoms at all.  Patient's past medical history and tPA exclusion criteria reviewed.  Patient is not a candidate for tPA secondary to Neurological signs that are improving rapidly or no measurable deficit on NIHSS and Isolated mild neurological deficit such as ataxia alone, sensory loss alone, dysarthria alone or minimal weakness.      Case Discussed with:    Case discussed with Dr. Marveen Reeks (ED attending).

## 2016-12-31 LAB — ECG 12-LEAD
Atrial Rate: 87 {beats}/min
P Axis: 69 degrees
P-R Interval: 168 ms
Q-T Interval: 432 ms
QRS Duration: 162 ms
QTC Calculation (Bezet): 519 ms
R Axis: -58 degrees
T Axis: 63 degrees
Ventricular Rate: 87 {beats}/min

## 2017-01-24 ENCOUNTER — Ambulatory Visit (INDEPENDENT_AMBULATORY_CARE_PROVIDER_SITE_OTHER): Payer: Self-pay | Admitting: Nurse Practitioner

## 2017-02-28 ENCOUNTER — Other Ambulatory Visit: Payer: Self-pay | Admitting: Cardiovascular Disease

## 2017-02-28 ENCOUNTER — Ambulatory Visit (INDEPENDENT_AMBULATORY_CARE_PROVIDER_SITE_OTHER): Payer: Self-pay | Admitting: Cardiovascular Disease

## 2017-02-28 DIAGNOSIS — I6523 Occlusion and stenosis of bilateral carotid arteries: Secondary | ICD-10-CM

## 2017-03-07 ENCOUNTER — Ambulatory Visit
Admission: RE | Admit: 2017-03-07 | Discharge: 2017-03-07 | Disposition: A | Discharge status: Home / Self Care | Payer: Medicare Other | Source: Ambulatory Visit | Attending: Cardiovascular Disease | Admitting: Cardiovascular Disease

## 2017-03-07 DIAGNOSIS — I6523 Occlusion and stenosis of bilateral carotid arteries: Secondary | ICD-10-CM | POA: Insufficient documentation

## 2017-05-23 ENCOUNTER — Ambulatory Visit (INDEPENDENT_AMBULATORY_CARE_PROVIDER_SITE_OTHER): Payer: Self-pay | Admitting: Nurse Practitioner

## 2017-08-28 ENCOUNTER — Ambulatory Visit (INDEPENDENT_AMBULATORY_CARE_PROVIDER_SITE_OTHER): Payer: Self-pay | Admitting: Cardiovascular Disease

## 2017-09-04 ENCOUNTER — Ambulatory Visit (INDEPENDENT_AMBULATORY_CARE_PROVIDER_SITE_OTHER): Payer: Self-pay | Admitting: Nurse Practitioner

## 2017-10-10 ENCOUNTER — Ambulatory Visit (INDEPENDENT_AMBULATORY_CARE_PROVIDER_SITE_OTHER): Payer: Self-pay | Admitting: Nurse Practitioner

## 2017-12-21 ENCOUNTER — Ambulatory Visit (INDEPENDENT_AMBULATORY_CARE_PROVIDER_SITE_OTHER): Payer: Self-pay | Admitting: Cardiovascular Disease

## 2018-01-31 ENCOUNTER — Other Ambulatory Visit: Payer: Self-pay | Admitting: Cardiovascular Disease

## 2018-01-31 DIAGNOSIS — I6523 Occlusion and stenosis of bilateral carotid arteries: Secondary | ICD-10-CM

## 2018-02-27 ENCOUNTER — Ambulatory Visit (INDEPENDENT_AMBULATORY_CARE_PROVIDER_SITE_OTHER): Payer: Self-pay | Admitting: Nurse Practitioner

## 2018-03-08 ENCOUNTER — Ambulatory Visit: Payer: Medicare Other

## 2018-03-08 ENCOUNTER — Ambulatory Visit
Admission: RE | Admit: 2018-03-08 | Discharge: 2018-03-08 | Disposition: A | Discharge status: Home / Self Care | Payer: Medicare Other | Source: Ambulatory Visit | Attending: Cardiovascular Disease | Admitting: Cardiovascular Disease

## 2018-03-08 DIAGNOSIS — I6523 Occlusion and stenosis of bilateral carotid arteries: Secondary | ICD-10-CM | POA: Insufficient documentation

## 2018-08-12 DIAGNOSIS — J9690 Respiratory failure, unspecified, unspecified whether with hypoxia or hypercapnia: Secondary | ICD-10-CM

## 2018-08-12 DIAGNOSIS — I428 Other cardiomyopathies: Secondary | ICD-10-CM

## 2018-08-12 DIAGNOSIS — D649 Anemia, unspecified: Secondary | ICD-10-CM

## 2018-08-12 HISTORY — DX: Anemia, unspecified: D64.9

## 2018-08-12 HISTORY — DX: Respiratory failure, unspecified, unspecified whether with hypoxia or hypercapnia: J96.90

## 2018-08-12 HISTORY — DX: Other cardiomyopathies: I42.8

## 2018-08-16 ENCOUNTER — Inpatient Hospital Stay
Admission: EM | Admit: 2018-08-16 | Discharge: 2018-08-27 | DRG: 224 | Disposition: A | Discharge status: Home Health | Payer: Medicare Other | Attending: Internal Medicine | Admitting: Internal Medicine

## 2018-08-16 ENCOUNTER — Emergency Department: Payer: Medicare Other

## 2018-08-16 DIAGNOSIS — R402332 Coma scale, best motor response, abnormal, at arrival to emergency department: Secondary | ICD-10-CM | POA: Diagnosis present

## 2018-08-16 DIAGNOSIS — I4901 Ventricular fibrillation: Principal | ICD-10-CM | POA: Diagnosis present

## 2018-08-16 DIAGNOSIS — N183 Chronic kidney disease, stage 3 (moderate): Secondary | ICD-10-CM | POA: Diagnosis present

## 2018-08-16 DIAGNOSIS — G931 Anoxic brain damage, not elsewhere classified: Secondary | ICD-10-CM | POA: Diagnosis present

## 2018-08-16 DIAGNOSIS — E1165 Type 2 diabetes mellitus with hyperglycemia: Secondary | ICD-10-CM | POA: Diagnosis present

## 2018-08-16 DIAGNOSIS — I5022 Chronic systolic (congestive) heart failure: Secondary | ICD-10-CM | POA: Diagnosis present

## 2018-08-16 DIAGNOSIS — J9601 Acute respiratory failure with hypoxia: Secondary | ICD-10-CM | POA: Diagnosis present

## 2018-08-16 DIAGNOSIS — N179 Acute kidney failure, unspecified: Secondary | ICD-10-CM | POA: Diagnosis present

## 2018-08-16 DIAGNOSIS — Z91013 Allergy to seafood: Secondary | ICD-10-CM

## 2018-08-16 DIAGNOSIS — R402212 Coma scale, best verbal response, none, at arrival to emergency department: Secondary | ICD-10-CM | POA: Diagnosis present

## 2018-08-16 DIAGNOSIS — I462 Cardiac arrest due to underlying cardiac condition: Secondary | ICD-10-CM | POA: Diagnosis present

## 2018-08-16 DIAGNOSIS — R32 Unspecified urinary incontinence: Secondary | ICD-10-CM | POA: Diagnosis not present

## 2018-08-16 DIAGNOSIS — I428 Other cardiomyopathies: Secondary | ICD-10-CM | POA: Diagnosis present

## 2018-08-16 DIAGNOSIS — Z7984 Long term (current) use of oral hypoglycemic drugs: Secondary | ICD-10-CM

## 2018-08-16 DIAGNOSIS — R579 Shock, unspecified: Secondary | ICD-10-CM | POA: Diagnosis present

## 2018-08-16 DIAGNOSIS — E872 Acidosis: Secondary | ICD-10-CM | POA: Diagnosis not present

## 2018-08-16 DIAGNOSIS — E1122 Type 2 diabetes mellitus with diabetic chronic kidney disease: Secondary | ICD-10-CM | POA: Diagnosis present

## 2018-08-16 DIAGNOSIS — R339 Retention of urine, unspecified: Secondary | ICD-10-CM | POA: Diagnosis not present

## 2018-08-16 DIAGNOSIS — Z79899 Other long term (current) drug therapy: Secondary | ICD-10-CM

## 2018-08-16 DIAGNOSIS — I469 Cardiac arrest, cause unspecified: Secondary | ICD-10-CM | POA: Diagnosis present

## 2018-08-16 DIAGNOSIS — Z20828 Contact with and (suspected) exposure to other viral communicable diseases: Secondary | ICD-10-CM | POA: Diagnosis present

## 2018-08-16 DIAGNOSIS — I130 Hypertensive heart and chronic kidney disease with heart failure and stage 1 through stage 4 chronic kidney disease, or unspecified chronic kidney disease: Secondary | ICD-10-CM | POA: Diagnosis present

## 2018-08-16 DIAGNOSIS — I2510 Atherosclerotic heart disease of native coronary artery without angina pectoris: Secondary | ICD-10-CM

## 2018-08-16 DIAGNOSIS — I447 Left bundle-branch block, unspecified: Secondary | ICD-10-CM | POA: Diagnosis present

## 2018-08-16 DIAGNOSIS — E870 Hyperosmolality and hypernatremia: Secondary | ICD-10-CM | POA: Diagnosis not present

## 2018-08-16 DIAGNOSIS — I472 Ventricular tachycardia: Secondary | ICD-10-CM | POA: Diagnosis not present

## 2018-08-16 DIAGNOSIS — D649 Anemia, unspecified: Secondary | ICD-10-CM | POA: Diagnosis present

## 2018-08-16 DIAGNOSIS — R1310 Dysphagia, unspecified: Secondary | ICD-10-CM | POA: Diagnosis present

## 2018-08-16 DIAGNOSIS — I248 Other forms of acute ischemic heart disease: Secondary | ICD-10-CM | POA: Diagnosis not present

## 2018-08-16 DIAGNOSIS — R402132 Coma scale, eyes open, to sound, at arrival to emergency department: Secondary | ICD-10-CM | POA: Diagnosis present

## 2018-08-16 HISTORY — DX: Type 2 diabetes mellitus without complications: E11.9

## 2018-08-16 MED ORDER — PROPOFOL INFUSION 10 MG/ML
10.00 ug/kg/min | INTRAVENOUS | Status: DC
Start: 2018-08-16 — End: 2018-08-17

## 2018-08-16 MED ORDER — SUCCINYLCHOLINE CHLORIDE 20 MG/ML IJ SOLN
100.0000 mg | Freq: Once | INTRAMUSCULAR | Status: AC
Start: 2018-08-16 — End: 2018-08-16
  Administered 2018-08-16: 100 mg via INTRAVENOUS

## 2018-08-16 MED ORDER — ETOMIDATE 2 MG/ML IV SOLN
20.0000 mg | Freq: Once | INTRAVENOUS | Status: AC
Start: 2018-08-16 — End: 2018-08-16
  Administered 2018-08-16: 20 mg via INTRAVENOUS

## 2018-08-16 MED ORDER — PROPOFOL 10 MG/ML IV EMUL (WRAP)
INTRAVENOUS | Status: AC
Start: 2018-08-16 — End: 2018-08-16
  Administered 2018-08-16: 10 ug/kg/min via INTRAVENOUS
  Filled 2018-08-16: qty 100

## 2018-08-16 NOTE — ED Notes (Signed)
Bed: S 22  Expected date:   Expected time:   Means of arrival:   Comments:  Medic 423 to 22

## 2018-08-16 NOTE — ED Provider Notes (Signed)
Physician/Midlevel provider first contact with patient: 08/16/18 2348         Date: 08/17/18  Patient Name: Tara Perry  Attending Physician: Trudie Buckler, MD      HPI: 73 y/o female BIBA with cardiac arrest with ROSC.  Initial call for SOB.  Pt with cardiac arrest prior to EMS arrival.  Family reportedly doing chest compression.  Vfib on EMS arrival.  Defib x3 at 150J, 1 round of epi - ROSC.  King airway in place.    Exam:   Constitutional: Vital signs reviewed. Intubated, bucking vent periodically  Head: NCAT  Eyes: Normal conjunctiva  ENT: MMM  Neck: Normal range of motion. Trachea midline.  Respiratory/Chest: On vent  Cardiovascular: RRR, no M/G/R  Abdomen: Soft, NT, ND  Musculoskeletal: no lower extremity edema. No cyanosis.   Neurological: MAE  Skin: Warm, dry, no rash    A/P: 73 y/o female BIBA s/p Vfib arrest with ROSC after defib x3 and epi x1.  Intubated with ETT on arrival for airway protection.      11:53 PM  D/w cardiology [phone]) - no cath lab activation at this point.  Cooling.  Medical management.    12:46 AM  D/w family - pt's sister and 2 child family members with pt at time.  Pt said, "Ow," while in chair which is not uncommon for her and shortly thereafter became completely unresponsive with head back in chair.  Family called 911 and started chest compressions after ~10 seconds.  No seizure activity.  Normal state of health earlier in day.    Disposition:   ED Disposition     ED Disposition Condition Date/Time Comment    Admit  Sat Aug 17, 2018  1:00 AM Admitting Physician: Eden Emms [phone]   Diagnosis: Cardiac arrest [427.5.ICD-9-CM]   Estimated Length of Stay: > or = to 2 midnights   Tentative Discharge Plan?: Home or Self Care [1]   Patient Class: Inpatient [101]          Clinical Impression:   1. Cardiac arrest        Diagnostic Study Results     Labs -     Results     Procedure Component Value Units Date/Time    Troponin I [[id number]]  (Abnormal) Collected:  08/17/18 1923     Specimen:  Blood Updated:  08/17/18 2012     Troponin I 0.35 ng/mL     Lactic acid, plasma [[id number]] Collected:  08/17/18 1923    Specimen:  Blood Updated:  08/17/18 1934     Lactic acid 1.0 mmol/L     Basic Metabolic Panel [[id number]]  (Abnormal) Collected:  08/17/18 1806    Specimen:  Blood Updated:  08/17/18 1838     Glucose 197 mg/dL      BUN 29.5 mg/dL      Creatinine 1.5 mg/dL      Calcium 8.3 mg/dL      Sodium 284 mEq/L      Potassium 4.7 mEq/L      Chloride 108 mEq/L      CO2 18 mEq/L     Phosphorus [[id number]]  (Abnormal) Collected:  08/17/18 1806    Specimen:  Blood Updated:  08/17/18 1838     Phosphorus 4.8 mg/dL     GFR [[id number]] Collected:  08/17/18 1806     Updated:  08/17/18 1838     EGFR 34.0    APTT [[id number]] Collected:  08/17/18 1806     Updated:  08/17/18 1838  PTT 29 sec     Magnesium [[id number]] Collected:  08/17/18 1806    Specimen:  Blood Updated:  08/17/18 1838     Magnesium 1.8 mg/dL     Prothrombin time/INR [[id number]] Collected:  08/17/18 1806    Specimen:  Blood Updated:  08/17/18 1837     PT 13.7 sec      PT INR 1.1    Calcium, ionized [[id number]] Collected:  08/17/18 1806    Specimen:  Blood Updated:  08/17/18 1818     Calcium, Ionized 2.37 mEq/L     Blood gas, arterial [[id number]]  (Abnormal) Collected:  08/17/18 1806    Specimen:  Blood, Arterial Updated:  08/17/18 1818     pH, Arterial 7.401     pCO2, Arterial 33.0 mmHg      pO2, Arterial 191.0 mmHg      HCO3, Arterial 20.6 mEq/L      Arterial Total CO2 21.7 mEq/L      Base Excess, Arterial -3.5 mEq/L      O2 Sat, Arterial 99.2 %      Temperature 35.1     FIO2 60 %      Rate 16 BPM      Mode: PRVC     PEEP 6     Tidal vol. 440    Glucose Whole Blood - POCT [[id number]]  (Abnormal) Collected:  08/17/18 1742     Updated:  08/17/18 1744     POCT - Glucose Whole blood 172 mg/dL     APTT [[id number]] Collected:  08/17/18 1150     Updated:  08/17/18 1556     PTT 26 sec     Prothrombin time/INR [[id number]] Collected:  08/17/18 1150     Specimen:  Blood Updated:  08/17/18 1555     PT 14.1 sec      PT INR 1.1    Phosphorus [[id number]] Collected:  08/17/18 1150    Specimen:  Blood Updated:  08/17/18 1555     Phosphorus 4.2 mg/dL     Magnesium [[id number]] Collected:  08/17/18 1150    Specimen:  Blood Updated:  08/17/18 1555     Magnesium 1.9 mg/dL     Basic Metabolic Panel [[id number]]  (Abnormal) Collected:  08/17/18 1150    Specimen:  Blood Updated:  08/17/18 1555     Glucose 171 mg/dL      BUN 30.1 mg/dL      Creatinine 1.5 mg/dL      Calcium 8.2 mg/dL      Sodium 601 mEq/L      Potassium 4.7 mEq/L      Chloride 106 mEq/L      CO2 18 mEq/L     GFR [[id number]] Collected:  08/17/18 1150     Updated:  08/17/18 1555     EGFR 34.0    Calcium, ionized [[id number]] Collected:  08/17/18 1150    Specimen:  Blood Updated:  08/17/18 1540     Calcium, Ionized 2.32 mEq/L     Glucose Whole Blood - POCT [[id number]]  (Abnormal) Collected:  08/17/18 1200     Updated:  08/17/18 1210     POCT - Glucose Whole blood 190 mg/dL     Calcium, ionized [[id number]] Collected:  08/17/18 1150    Specimen:  Blood Updated:  08/17/18 1150    Narrative:       SST also acceptable    MRSA culture - Nares (If not done in triage) [[id number]] Collected:  08/17/18 5462  Specimen:  Culturette from Nares Updated:  08/17/18 0830    MRSA culture - Throat (If not done in triage) [[id number]] Collected:  08/17/18 0621    Specimen:  Culturette from Throat Updated:  08/17/18 0830    Glucose Whole Blood - POCT [[id number]]  (Abnormal) Collected:  08/17/18 0729     Updated:  08/17/18 0735     POCT - Glucose Whole blood 183 mg/dL     Magnesium [[id number]] Collected:  08/17/18 0621    Specimen:  Blood Updated:  08/17/18 0709     Magnesium 1.9 mg/dL     Phosphorus [[id number]] Collected:  08/17/18 0621    Specimen:  Blood Updated:  08/17/18 0709     Phosphorus 4.3 mg/dL     GFR [[id number]] Collected:  08/17/18 0621     Updated:  08/17/18 0709     EGFR 29.4    Hepatic function panel (LFT) [[id number]]   (Abnormal) Collected:  08/17/18 0621    Specimen:  Blood Updated:  08/17/18 0709     Bilirubin, Total 0.3 mg/dL      Bilirubin, Direct 0.2 mg/dL      Bilirubin, Indirect 0.1 mg/dL      AST (SGOT) 643 U/L      ALT 156 U/L      Alkaline Phosphatase 93 U/L      Protein, Total 7.1 g/dL      Albumin 4.1 g/dL      Globulin 3.0 g/dL      Albumin/Globulin Ratio 1.4    Basic Metabolic Panel [[id number]]  (Abnormal) Collected:  08/17/18 0621    Specimen:  Blood Updated:  08/17/18 0709     Glucose 202 mg/dL      BUN 66.0 mg/dL      Creatinine 1.7 mg/dL      Sodium 630 mEq/L      Potassium 5.1 mEq/L      Chloride 106 mEq/L      CO2 22 mEq/L      Calcium 9.3 mg/dL     Troponin I [[id number]]  (Abnormal) Collected:  08/17/18 0621    Specimen:  Blood Updated:  08/17/18 0708     Troponin I 0.82 ng/mL     APTT [[id number]] Collected:  08/17/18 0621     Updated:  08/17/18 0652     PTT 27 sec     Prothrombin time/INR [[id number]] Collected:  08/17/18 0621    Specimen:  Blood Updated:  08/17/18 0651     PT 13.8 sec      PT INR 1.1    CBC and differential [[id number]]  (Abnormal) Collected:  08/17/18 0621    Specimen:  Blood Updated:  08/17/18 0649     WBC 16.93 x10 3/uL      Hgb 12.3 g/dL      Hematocrit 60.7 %      Platelets 218 x10 3/uL      RBC 4.13 x10 6/uL      MCV 92.3 fL      MCH 29.8 pg      MCHC 32.3 g/dL      RDW 13 %      MPV 10.0 fL      Neutrophils 78.8 %      Lymphocytes Automated 10.9 %      Monocytes 8.6 %      Eosinophils Automated 0.8 %      Basophils Automated 0.3 %      Immature Granulocyte 0.6 %  Nucleated RBC 0.0 /100 WBC      Neutrophils Absolute 13.33 x10 3/uL      Abs Lymph Automated 1.85 x10 3/uL      Abs Mono Automated 1.46 x10 3/uL      Abs Eos Automated 0.13 x10 3/uL      Absolute Baso Automated 0.05 x10 3/uL      Absolute Immature Granulocyte 0.11 x10 3/uL      Absolute NRBC 0.00 x10 3/uL     Lactic acid, plasma [[id number]]  (Abnormal) Collected:  08/17/18 0621    Specimen:  Blood Updated:  08/17/18 0629      Lactic acid 2.8 mmol/L     Troponin I [[id number]] Collected:  08/17/18 0621    Specimen:  Blood Updated:  08/17/18 0621    Glucose Whole Blood - POCT [[id number]]  (Abnormal) Collected:  08/17/18 0403     Updated:  08/17/18 0504     POCT - Glucose Whole blood 193 mg/dL     Blood Culture Aerobic/Anaerobic #1 [[id number]] Collected:  08/17/18 0105    Specimen:  Arm from Blood, Venipuncture Updated:  08/17/18 0328    Narrative:       1 BLUE+1 PURPLE    Blood Culture Aerobic/Anaerobic #2 [[id number]] Collected:  08/17/18 0216    Specimen:  Arm from Blood, Venipuncture Updated:  08/17/18 0328    Narrative:       1 BLUE+1 PURPLE    COVID-19 (SARS-CoV-2) ([place] [facility]) [[id number]] Collected:  08/17/18 0157    Specimen:  Nasopharyngeal Swab Updated:  08/17/18 0323     Purpose of COVID testing Screening     SARS-CoV-2 Specimen Source Nasopharyngeal     SARS CoV 2 Overall Result Negative    Narrative:       o Collect and clearly label specimen type:  o Upper respiratory specimen: One Nasopharyngeal Dry Swab NO  Transport Media.  o Hand deliver to laboratory ASAP    Lactic acid, plasma [[id number]]  (Abnormal) Collected:  08/17/18 0216    Specimen:  Blood Updated:  08/17/18 0234     Lactic acid 3.7 mmol/L     B-type Natriuretic Peptide [[id number]]  (Abnormal) Collected:  08/16/18 2339    Specimen:  Blood Updated:  08/17/18 0130     B-Natriuretic Peptide 139 pg/mL     Cell MorpHology [[id number]] Collected:  08/16/18 2339     Updated:  08/17/18 0113     Cell Morphology: Normal     Platelet Estimate Normal    Manual Differential [[id number]]  (Abnormal) Collected:  08/16/18 2339     Updated:  08/17/18 0113     Segmented Neutrophils 34 %      Band Neutrophils 3 %      Lymphocytes Manual 54 %      Monocytes Manual 3 %      Eosinophils Manual 3 %      Basophils Manual 0 %      Atypical Lymph % 3 %      Abs Seg Manual 3.30 x10 3/uL      Bands Absolute 0.29 x10 3/uL      Absolute Lymph Manual 5.24 x10 3/uL      Monocytes Absolute 0.29 x10  3/uL      Absolute Eos Manual 0.29 x10 3/uL      Absolute Baso Manual 0.00 x10 3/uL      Atypical Lymph Absolute 0.29 x10 3/uL     CBC and differential [[id number]]  (Abnormal) Collected:  08/16/18  2339    Specimen:  Blood Updated:  08/17/18 0112     WBC 9.71 x10 3/uL      Hgb 12.2 g/dL      Hematocrit 16.1 %      Platelets 255 x10 3/uL      RBC 4.03 x10 6/uL      MCV 92.3 fL      MCH 30.3 pg      MCHC 32.8 g/dL      RDW 12 %      MPV 10.1 fL      Nucleated RBC 0.2 /100 WBC      Absolute NRBC 0.02 x10 3/uL     i-Stat EG7 Arterial CartrIDge [[id number]]  (Abnormal) Collected:  08/17/18 0036     Updated:  08/17/18 0039     i-STAT pH Arterial 7.322     i-STAT pCO2 Arterial 37.0     i-STAT pO2 Arterial 276.0     i-STAT HCO3 Bicarbonate Arterial 19.5 mEq/L      i-STAT Total CO2 Arterial 21.0 mEq/L      i-STAT Base Excess Arterial -6.0 mEq/L      i-STAT O2 Saturation Arterial 100.0 %      i-STAT Calcium Ionized 2.20 mEq/L      i-STAT Sodium 136 mEq/L      i-STAT Potassium 4.3 mEq/L      i-STAT Hemoglobin 9.2 g/dL      i-STAT Hematocrit 27.0 %      i-STAT Patient Temperature 96.0 F     i-STAT FIO2 100     i-STAT O2 Delivery Vent     i-STAT Allen's Test Pass     i-STAT Draw Site R Radial     i-STAT Mode PRVC     i-STAT Set Rate 16 BPM      i-STAT Peep 6 cmH2O      i-STAT Vent Volume 440    PT/APTT [[id number]] Collected:  08/16/18 2339     Updated:  08/17/18 0026     PT 13.4 sec      PT INR 1.0     PTT 26 sec     Troponin I [[id number]] Collected:  08/16/18 2339    Specimen:  Blood Updated:  08/17/18 0016     Troponin I 0.01 ng/mL     Comprehensive metabolic panel [[id number]]  (Abnormal) Collected:  08/16/18 2339    Specimen:  Blood Updated:  08/17/18 0009     Glucose 328 mg/dL      BUN 52.8 mg/dL      Creatinine 2.1 mg/dL      Sodium 413 mEq/L      Potassium 4.0 mEq/L      Chloride 106 mEq/L      CO2 16 mEq/L      Calcium 9.1 mg/dL      Protein, Total 6.3 g/dL      Albumin 3.6 g/dL      AST (SGOT) 244 U/L      ALT 121 U/L       Alkaline Phosphatase 91 U/L      Bilirubin, Total 0.1 mg/dL      Globulin 2.7 g/dL      Albumin/Globulin Ratio 1.3    Magnesium [[id number]] Collected:  08/16/18 2339    Specimen:  Blood Updated:  08/17/18 0009     Magnesium 2.1 mg/dL     GFR [[id number]] Collected:  08/16/18 2339     Updated:  08/17/18 0009     EGFR 23.0  Radiologic Studies -   Radiology Results (24 Hour)     Procedure Component Value Units Date/Time    XR Chest AP Portable [[id number]] Collected:  08/17/18 1828    Order Status:  Completed Updated:  08/17/18 1831    Narrative:       HISTORY: Central line adjustment    COMPARISON:Earlier the same day    FINDINGS:   The right IJ central venous catheter is now at the caval chilled  junction. Endotracheal tube, esophageal temperature probe and enteric  tubes are unchanged in position. Unchanged retrocardiac opacity. No  pleural effusion or pneumothorax.      Impression:          Right IJ central venous catheter has been pulled back with tip now at  the cavoatrial junction.    Shelly Flatten, MD   08/17/2018 6:29 PM    XR Chest AP Portable [[id number]] Collected:  08/17/18 1725    Order Status:  Completed Updated:  08/17/18 1730    Narrative:       HISTORY: Central line placement    COMPARISON:08/16/2018    FINDINGS:   Endotracheal tube is 3 cm above the carina. Interval placement of a  right IJ central venous catheter with tip in the right atrium.  Esophageal temperature probe in place. Enteric tube with 5 on the  stomach. Right lung is clear. Retrocardiac opacity may be secondary to  small effusion or consolidation.      Impression:            1. Right IJ central venous catheter placement with tip in the right  atrium.  2. Retrocardiac opacity may represent atelectasis or effusion.    Shelly Flatten, MD   08/17/2018 5:28 PM    CT Head WO Contrast [[id number]] Collected:  08/17/18 0033    Order Status:  Completed Updated:  08/17/18 0038    Narrative:       HISTORY: cardiac arrest, ams     COMPARISON:  12/29/2016.    TECHNIQUE: CT of the head without intravenous contrast.     The following dose reduction techniques were utilized: Automated  exposure control and/or adjustment of the mA and/or kV according to  patient size, and the use of iterative reconstruction technique.    FINDINGS:    A portion of the study was repeated due to excessive artifact.    The patient is intubated.    Stable degree of brain parenchymal volume loss with proportional  ventricular size. No evidence of acute intracranial mass effect or  evidence of ventricular outflow obstruction.    Mild supratentorial white matter changes, nonspecific but most commonly  related to chronic microvascular ischemia. No territorial loss of  gray-white matter differentiation.    No acute intracranial hemorrhage.    No depressed skull fracture or aggressive appearing osseous lesions.     The visualized paranasal sinuses and temporal bone structures are  well-aerated.       Impression:           No acute intracranial abnormality.     Eloise Harman, MD   08/17/2018 12:36 AM    Chest AP Portable [[id number]] Collected:  08/17/18 0008    Order Status:  Completed Updated:  08/17/18 0013    Narrative:       HISTORY: INTUBATION     COMPARISON: 09/13/2012.    TECHNIQUE:  AP chest    FINDINGS:    ET tube tip 3.2 cm above the carina. Gastric drainage tube sidehole  in  the stomach and tip below the field-of-view. Cardiac monitoring leads  and defibrillator pads.    Central vascular prominence, hazy upper lung opacities, and minimal  bibasilar opacities. No pleural effusion or pneumothorax.    Borderline enlarged cardiopericardial silhouette, similar to prior  accounting for AP technique and lower inspiration.    Grossly unremarkable appearance of the visualized upper abdomen.      Impression:            1. ET tube tip 3.2 cm above the carina and gastric drainage tube  sidehole in the stomach and tip below the field-of-view.    2. Central vascular congestion, likely upper  lobe edema, and bibasilar  atelectasis. Infection/aspiration cannot be entirely excluded.    3. Borderline enlarged cardiopericardial silhouette.    Eloise Harman, MD   08/17/2018 12:11 AM      .    Medical Decision Making     I reviewed the vital signs, nursing notes, past medical history, past surgical history, family history and social history.  I have reviewed the patient's previous charts.  Vital Signs - BP 109/55    Pulse (!) 47    Temp (!) 94.1 F (34.5 C) (Rectal)    Resp 22    Ht 5' 4.02" (1.626 m)    Wt 67.8 kg    SpO2 100%    BMI 25.64 kg/m      Pulse Oximetry Analysis - Normal  Laboratory results reviewed and interpreted by EDP: Yes  Radiologic study results reviewed by EDP: Yes  Radiologic Studies Interpreted (viewed) by EDP: Yes    EKG Analysis reviewed and interpreted by me: NSR, LAD, LBBB, HR 98     Cardiac Monitor interpretation: NSR with HR 98    Critical Care Time(not including procedures): 42 minutes.   Due to the high risk of critical illness or multi-organ failure at initial presentation and/or during ED course.    System(s) at risk for compromise:  circulatory, respiratory and CNS  Critical Diagnosis:   1. Cardiac arrest         The patient was Hypotensive:   No     The patient was Hypoxic:   No     This does not including time spent performing other reported procedures or services.   Critical care time involved full attention to the patient's condition and included:   Review of nursing notes and/or old charts - Yes  Documentation time - Yes  Care, transfer of care, and discharge plans - Yes  Obtaining necessary history from family, EMS, nursing home staff and/or treating physicians - Yes  Review of medications, allergies, and vital signs - Yes   Consultant collaboration on findings and treatment options - Yes  Ordering, interpreting, and reviewing diagnostic studies/tab tests - Yes       Attending Note      The patient was seen and examined by the resident. I have also personally  examined and evaluated the patient. The plan of care was discussed with me. I agree with the plan as it was presented to me.            Newell Coral, MD  08/17/18 2128

## 2018-08-16 NOTE — ED Provider Notes (Signed)
Physician/Midlevel provider first contact with patient: 08/16/18 2348         History   No chief complaint on file.      Tara Perry is a 72yo female BIBA for concerns of SOB, family started CPR at home. EMS found pt to be in Vfib with 3 shocks delivered, 1 round of epi, with ROSC achieved. Pt arrived with supraglottic airway in place, breathing on her own and attempting to reject the tube. Not responsive to commands.     A later report from family indicates patient was sitting in a chair, stood up and complained of usual leg pain, sat back down and was unresponsive. Immediate chest compressions started.      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     Allergies   Allergen Reactions    Shellfish-Derived Products Anaphylaxis       Home Medications     Med List Status:  In Progress Set By: Enedina Finner, RN at 08/16/2018 11:40 PM                atorvastatin (LIPITOR) 10 MG tablet     Take 10 mg by mouth daily.      Carvedilol (COREG PO)     Take by mouth.      carvedilol (COREG) 25 MG tablet     Take 25 mg by mouth 2 (two) times daily. Morning and nights     estradiol (VAGIFEM) 25 MCG vaginal tablet     Place 25 mcg vaginally daily.     furosemide (LASIX) 20 MG tablet     Take 1 tablet (20 mg total) by mouth as needed (for shortness of breath).     glimepiride (AMARYL) 2 MG tablet     Take 2 mg by mouth daily. At night     lisinopril  (PRINIVIL,ZESTRIL) 20 MG tablet     Take 20 mg by mouth daily.       metFORMIN (GLUCOPHAGE) 500 MG tablet     Take 500 mg by mouth 2 (two) times daily. Morning and night     Multiple Vitamins-Minerals (CENTRUM PO)     Take by mouth.       sitaGLIPtin (JANUVIA) 100 MG tablet     Take 100 mg by mouth daily. At night      vitamin B-12 (CYANOCOBALAMIN) 100 MCG tablet     Take 50 mcg by mouth daily.             Review of Systems   Unable to  perform ROS: Mental status change   Respiratory:        Family reporting SOB prior to pulse loss   All other systems reviewed and are negative.      Physical Exam    BP: 131/79, Heart Rate: 100, Temp: (!) 96.1 F (35.6 C), Resp Rate: 22, SpO2: 100 %, Weight: 78 kg     Physical Exam  Vitals signs and nursing note reviewed.   Constitutional:       Appearance: Normal appearance. She is obese.   HENT:      Head: Normocephalic and atraumatic.      Nose: Nose normal.      Mouth/Throat:      Mouth: Mucous membranes are moist.   Eyes:      Conjunctiva/sclera: Conjunctivae normal.      Pupils: Pupils are equal, round, and reactive to light.   Neck:      Musculoskeletal: Normal range of motion and neck supple.   Cardiovascular:      Rate and Rhythm: Normal rate and regular rhythm.      Pulses: Normal pulses.      Heart sounds: Normal heart sounds.   Pulmonary:      Effort: Pulmonary effort is normal.      Breath sounds: Normal breath sounds.   Abdominal:      General: Abdomen is flat. There is no distension.      Palpations: Abdomen is soft.      Tenderness: There is no abdominal tenderness.   Musculoskeletal:         General: No tenderness, deformity or signs of injury.      Right lower leg: No edema.      Left lower leg: No edema.   Skin:     General: Skin is warm and dry.      Capillary Refill: Capillary refill takes 2 to 3 seconds.   Neurological:      Mental Status: She is disoriented.           MDM and ED Course     ED Medication Orders (From admission, onward)    Start Ordered      Status Ordering Provider    08/17/18 0035 08/17/18 0034  cefTRIAXone (ROCEPHIN) 1 g in sodium chloride 0.9 % 100 mL IVPB mini-bag plus  Once     Route: Intravenous  Ordered Dose: 1 g     Ordered COLEMAN, PATRICK M    08/17/18 0035 08/17/18 0034  azithromycin (ZITHROMAX) 500 mg in sodium chloride 0.9 % 250 mL IVPB  Once     Route: Intravenous  Ordered Dose: 500 mg     Geannie Risen    08/16/18 2350 08/16/18 2349    Continuous     Route: Intravenous  Ordered Dose: 10-80 mcg/kg/min     Discontinued COLEMAN, PATRICK M    08/16/18 2347 08/16/18 2346  propofol (DIPRIVAN) 10 mg/mL infusion (ADULT)  Continuous     Route: Intravenous  Ordered Dose: 10-80 mcg/kg/min     Last MAR action:  No Dual Sign - Rate/Dose Change COLEMAN, PATRICK M    08/16/18 2336 08/16/18 2335  etomidate (AMIDATE) injection 20 mg  Once     Route: Intravenous  Ordered Dose: 20 mg     Last MAR action:  Given COLEMAN, PATRICK M    08/16/18 2336 08/16/18 2335  succinylcholine (ANECTINE) injection 100 mg  Once     Route: Intravenous  Ordered Dose: 100 mg  Last MAR action:  Given Newell Coral             MDM                 Intubation  Date/Time: 08/17/2018 12:11 AM  Performed by: Melton Krebs, MD  Authorized by: Newell Coral, MD     Consent:     Consent obtained:  Emergent situation  Pre-procedure details:     Patient status:  Unresponsive    Mallampati score:  I    Pretreatment meds: etomidate.    Paralytics:  Succinylcholine  Procedure details:     CPR in progress: no      Intubation method:  Oral    Oral intubation technique:  Video-assisted and lighted stylet    Laryngoscope blade:  Mac 3    Tube size (mm):  7.5    Tube type:  Cuffed    Number of attempts:  2    Ventilation between attempts: no      Cricoid pressure: yes      Tube visualized through cords: yes    Placement assessment:     ETT to teeth:  23    Tube secured with:  ETT holder    Breath sounds:  Equal    Placement verification: chest rise, condensation, CXR  verification, direct visualization and equal breath sounds      CXR findings:  ETT in proper place  Post-procedure details:     Patient tolerance of procedure:  Tolerated well, no immediate complications        Clinical Impression & Disposition     Clinical Impression  Final diagnoses:   None        ED Disposition     None           New Prescriptions    No medications on file                        Melton Krebs, MD  Resident  08/17/18 [phone]

## 2018-08-17 ENCOUNTER — Inpatient Hospital Stay: Payer: Medicare Other

## 2018-08-17 ENCOUNTER — Emergency Department: Payer: Medicare Other

## 2018-08-17 DIAGNOSIS — G931 Anoxic brain damage, not elsewhere classified: Secondary | ICD-10-CM

## 2018-08-17 DIAGNOSIS — J9601 Acute respiratory failure with hypoxia: Secondary | ICD-10-CM

## 2018-08-17 DIAGNOSIS — N179 Acute kidney failure, unspecified: Secondary | ICD-10-CM

## 2018-08-17 DIAGNOSIS — I469 Cardiac arrest, cause unspecified: Secondary | ICD-10-CM | POA: Diagnosis present

## 2018-08-17 DIAGNOSIS — R740 Nonspecific elevation of levels of transaminase and lactic acid dehydrogenase [LDH]: Secondary | ICD-10-CM

## 2018-08-17 HISTORY — DX: Cardiac arrest, cause unspecified: I46.9

## 2018-08-17 LAB — CBC AND DIFFERENTIAL
Absolute NRBC: 0.02 10*3/uL — ABNORMAL HIGH (ref 0.00–0.00)
Absolute NRBC: 0 10*3/uL (ref 0.00–0.00)
Basophils Absolute Automated: 0.05 10*3/uL (ref 0.00–0.08)
Basophils Automated: 0.3 %
Eosinophils Absolute Automated: 0.13 10*3/uL (ref 0.00–0.44)
Eosinophils Automated: 0.8 %
Hematocrit: 37.2 % (ref 34.7–43.7)
Hematocrit: 38.1 % (ref 34.7–43.7)
Hgb: 12.2 g/dL (ref 11.4–14.8)
Hgb: 12.3 g/dL (ref 11.4–14.8)
Immature Granulocytes Absolute: 0.11 10*3/uL — ABNORMAL HIGH (ref 0.00–0.07)
Immature Granulocytes: 0.6 %
Lymphocytes Absolute Automated: 1.85 10*3/uL (ref 0.42–3.22)
Lymphocytes Automated: 10.9 %
MCH: 30.3 pg (ref 25.1–33.5)
MCH: 29.8 pg (ref 25.1–33.5)
MCHC: 32.8 g/dL (ref 31.5–35.8)
MCHC: 32.3 g/dL (ref 31.5–35.8)
MCV: 92.3 fL (ref 78.0–96.0)
MCV: 92.3 fL (ref 78.0–96.0)
MPV: 10.1 fL (ref 8.9–12.5)
MPV: 10 fL (ref 8.9–12.5)
Monocytes Absolute Automated: 1.46 10*3/uL — ABNORMAL HIGH (ref 0.21–0.85)
Monocytes: 8.6 %
Neutrophils Absolute: 13.33 10*3/uL — ABNORMAL HIGH (ref 1.10–6.33)
Neutrophils: 78.8 %
Nucleated RBC: 0.2 /100 WBC — ABNORMAL HIGH (ref 0.0–0.0)
Nucleated RBC: 0 /100 WBC (ref 0.0–0.0)
Platelets: 255 10*3/uL (ref 142–346)
Platelets: 218 10*3/uL (ref 142–346)
RBC: 4.03 10*6/uL (ref 3.90–5.10)
RBC: 4.13 10*6/uL (ref 3.90–5.10)
RDW: 12 % (ref 11–15)
RDW: 13 % (ref 11–15)
WBC: 9.71 10*3/uL — ABNORMAL HIGH (ref 3.10–9.50)
WBC: 16.93 10*3/uL — ABNORMAL HIGH (ref 3.10–9.50)

## 2018-08-17 LAB — BASIC METABOLIC PANEL
BUN: 26 mg/dL — ABNORMAL HIGH (ref 7.0–19.0)
BUN: 27 mg/dL — ABNORMAL HIGH (ref 7.0–19.0)
BUN: 28 mg/dL — ABNORMAL HIGH (ref 7.0–19.0)
CO2: 22 mEq/L (ref 22–29)
CO2: 18 mEq/L — ABNORMAL LOW (ref 22–29)
CO2: 18 mEq/L — ABNORMAL LOW (ref 22–29)
Calcium: 9.3 mg/dL (ref 7.9–10.2)
Calcium: 8.2 mg/dL (ref 7.9–10.2)
Calcium: 8.3 mg/dL (ref 7.9–10.2)
Chloride: 106 mEq/L (ref 100–111)
Chloride: 106 mEq/L (ref 100–111)
Chloride: 108 mEq/L (ref 100–111)
Creatinine: 1.7 mg/dL — ABNORMAL HIGH (ref 0.6–1.0)
Creatinine: 1.5 mg/dL — ABNORMAL HIGH (ref 0.6–1.0)
Creatinine: 1.5 mg/dL — ABNORMAL HIGH (ref 0.6–1.0)
Glucose: 202 mg/dL — ABNORMAL HIGH (ref 70–100)
Glucose: 171 mg/dL — ABNORMAL HIGH (ref 70–100)
Glucose: 197 mg/dL — ABNORMAL HIGH (ref 70–100)
Potassium: 5.1 mEq/L (ref 3.5–5.1)
Potassium: 4.7 mEq/L (ref 3.5–5.1)
Potassium: 4.7 mEq/L (ref 3.5–5.1)
Sodium: 139 mEq/L (ref 136–145)
Sodium: 136 mEq/L (ref 136–145)
Sodium: 137 mEq/L (ref 136–145)

## 2018-08-17 LAB — MAN DIFF ONLY
Atypical Lymphocytes %: 3 %
Atypical Lymphocytes Absolute: 0.29 10*3/uL — ABNORMAL HIGH (ref 0.00–0.00)
Band Neutrophils Absolute: 0.29 10*3/uL (ref 0.00–1.00)
Band Neutrophils: 3 %
Basophils Absolute Manual: 0 10*3/uL (ref 0.00–0.08)
Basophils Manual: 0 %
Eosinophils Absolute Manual: 0.29 10*3/uL (ref 0.00–0.44)
Eosinophils Manual: 3 %
Lymphocytes Absolute Manual: 5.24 10*3/uL — ABNORMAL HIGH (ref 0.42–3.22)
Lymphocytes Manual: 54 %
Monocytes Absolute: 0.29 10*3/uL (ref 0.21–0.85)
Monocytes Manual: 3 %
Neutrophils Absolute Manual: 3.3 10*3/uL (ref 1.10–6.33)
Segmented Neutrophils: 34 %

## 2018-08-17 LAB — ECG 12-LEAD
Atrial Rate: 98 {beats}/min
Atrial Rate: 99 {beats}/min
P Axis: 76 degrees
P Axis: 79 degrees
P-R Interval: 184 ms
P-R Interval: 188 ms
Q-T Interval: 414 ms
Q-T Interval: 422 ms
QRS Duration: 166 ms
QRS Duration: 170 ms
QTC Calculation (Bezet): 531 ms
QTC Calculation (Bezet): 538 ms
R Axis: -63 degrees
R Axis: -67 degrees
T Axis: 85 degrees
T Axis: 93 degrees
Ventricular Rate: 98 {beats}/min
Ventricular Rate: 99 {beats}/min

## 2018-08-17 LAB — HEPATIC FUNCTION PANEL
ALT: 156 U/L — ABNORMAL HIGH (ref 0–55)
AST (SGOT): 229 U/L — ABNORMAL HIGH (ref 5–34)
Albumin/Globulin Ratio: 1.4 (ref 0.9–2.2)
Albumin: 4.1 g/dL (ref 3.5–5.0)
Alkaline Phosphatase: 93 U/L (ref 37–106)
Bilirubin Direct: 0.2 mg/dL (ref 0.0–0.5)
Bilirubin Indirect: 0.1 mg/dL — ABNORMAL LOW (ref 0.2–1.0)
Bilirubin, Total: 0.3 mg/dL (ref 0.2–1.2)
Globulin: 3 g/dL (ref 2.0–3.6)
Protein, Total: 7.1 g/dL (ref 6.0–8.3)

## 2018-08-17 LAB — GLUCOSE WHOLE BLOOD - POCT
Whole Blood Glucose POCT: 193 mg/dL — ABNORMAL HIGH (ref 70–100)
Whole Blood Glucose POCT: 183 mg/dL — ABNORMAL HIGH (ref 70–100)
Whole Blood Glucose POCT: 190 mg/dL — ABNORMAL HIGH (ref 70–100)
Whole Blood Glucose POCT: 172 mg/dL — ABNORMAL HIGH (ref 70–100)
Whole Blood Glucose POCT: 211 mg/dL — ABNORMAL HIGH (ref 70–100)

## 2018-08-17 LAB — CALCIUM, IONIZED
Calcium, Ionized: 2.32 mEq/L (ref 2.30–2.58)
Calcium, Ionized: 2.37 mEq/L (ref 2.30–2.58)

## 2018-08-17 LAB — TROPONIN I
Troponin I: 0.01 ng/mL (ref 0.00–0.05)
Troponin I: 0.35 ng/mL (ref 0.00–0.05)
Troponin I: 0.82 ng/mL (ref 0.00–0.05)

## 2018-08-17 LAB — I-STAT EG7 ARTERIAL CARTRIDGE
Hematocrit I-Stat: 27 % — ABNORMAL LOW (ref 37.0–47.0)
Hemoglobin I-Stat: 9.2 g/dL — ABNORMAL LOW (ref 12.0–16.0)
Potassium I-Stat: 4.3 mEq/L (ref 3.5–4.9)
Sodium I-Stat: 136 mEq/L — ABNORMAL LOW (ref 138–146)
i-STAT Base Excess Arterial: -6 mEq/L — ABNORMAL LOW (ref <=2.0)
i-STAT Calcium Ionized: 2.2 mEq/L — ABNORMAL LOW (ref 2.24–2.64)
i-STAT FIO2: 100
i-STAT HCO3 Bicarbonate Arterial: 19.5 mEq/L — ABNORMAL LOW (ref 23.0–29.0)
i-STAT O2 Saturation Arterial: 100 % (ref 95.0–100.0)
i-STAT Patient Temperature: 96
i-STAT Peep: 6 cmH2O
i-STAT Set Rate: 16 {beats}/min
i-STAT Total CO2 Arterial: 21 mEq/L — ABNORMAL LOW (ref 24.0–30.0)
i-STAT Vent Volume: 440
i-STAT pCO2 Arterial: 37 (ref 35.0–45.0)
i-STAT pH Arterial: 7.322 — ABNORMAL LOW (ref 7.350–7.450)
i-STAT pO2 Arterial: 276 — ABNORMAL HIGH (ref 80.0–90.0)

## 2018-08-17 LAB — PHOSPHORUS
Phosphorus: 4.3 mg/dL (ref 2.3–4.7)
Phosphorus: 4.2 mg/dL (ref 2.3–4.7)
Phosphorus: 4.8 mg/dL — ABNORMAL HIGH (ref 2.3–4.7)

## 2018-08-17 LAB — PT/INR
PT INR: 1.1 (ref 0.9–1.1)
PT INR: 1.1 (ref 0.9–1.1)
PT INR: 1.1 (ref 0.9–1.1)
PT: 13.8 s (ref 12.6–15.0)
PT: 14.1 s (ref 12.6–15.0)
PT: 13.7 s (ref 12.6–15.0)

## 2018-08-17 LAB — MAGNESIUM
Magnesium: 1.8 mg/dL (ref 1.6–2.6)
Magnesium: 1.9 mg/dL (ref 1.6–2.6)
Magnesium: 1.9 mg/dL (ref 1.6–2.6)
Magnesium: 2.1 mg/dL (ref 1.6–2.6)

## 2018-08-17 LAB — COMPREHENSIVE METABOLIC PANEL
ALT: 121 U/L — ABNORMAL HIGH (ref 0–55)
AST (SGOT): 158 U/L — ABNORMAL HIGH (ref 5–34)
Albumin/Globulin Ratio: 1.3 (ref 0.9–2.2)
Albumin: 3.6 g/dL (ref 3.5–5.0)
Alkaline Phosphatase: 91 U/L (ref 37–106)
BUN: 25 mg/dL — ABNORMAL HIGH (ref 7.0–19.0)
Bilirubin, Total: 0.1 mg/dL — ABNORMAL LOW (ref 0.2–1.2)
CO2: 16 mEq/L — ABNORMAL LOW (ref 22–29)
Calcium: 9.1 mg/dL (ref 7.9–10.2)
Chloride: 106 mEq/L (ref 100–111)
Creatinine: 2.1 mg/dL — ABNORMAL HIGH (ref 0.6–1.0)
Globulin: 2.7 g/dL (ref 2.0–3.6)
Glucose: 328 mg/dL — ABNORMAL HIGH (ref 70–100)
Potassium: 4 mEq/L (ref 3.5–5.1)
Protein, Total: 6.3 g/dL (ref 6.0–8.3)
Sodium: 138 mEq/L (ref 136–145)

## 2018-08-17 LAB — BLOOD GAS, ARTERIAL
Arterial Total CO2: 21.7 mEq/L — ABNORMAL LOW (ref 24.0–30.0)
Base Excess, Arterial: -3.5 mEq/L — ABNORMAL LOW (ref <=2.0)
FIO2: 60 %
HCO3, Arterial: 20.6 mEq/L — ABNORMAL LOW (ref 23.0–29.0)
O2 Sat, Arterial: 99.2 % (ref 95.0–100.0)
PEEP: 6
Rate: 16 {beats}/min
Temperature: 35.1
Tidal vol.: 440
pCO2, Arterial: 33 mmHg — ABNORMAL LOW (ref 35.0–45.0)
pH, Arterial: 7.401 (ref 7.350–7.450)
pO2, Arterial: 191 mmHg — ABNORMAL HIGH (ref 80.0–90.0)

## 2018-08-17 LAB — GFR
EGFR: 23
EGFR: 29.4
EGFR: 34
EGFR: 34

## 2018-08-17 LAB — COVID-19 (SARS-COV-2): SARS CoV 2 Overall Result: NEGATIVE

## 2018-08-17 LAB — APTT
PTT: 27 s (ref 23–37)
PTT: 26 s (ref 23–37)
PTT: 29 s (ref 23–37)

## 2018-08-17 LAB — LACTIC ACID, PLASMA
Lactic Acid: 3.7 mmol/L — ABNORMAL HIGH (ref 0.2–2.0)
Lactic Acid: 2.8 mmol/L — ABNORMAL HIGH (ref 0.2–2.0)
Lactic Acid: 1 mmol/L (ref 0.2–2.0)

## 2018-08-17 LAB — B-TYPE NATRIURETIC PEPTIDE: B-Natriuretic Peptide: 139 pg/mL — ABNORMAL HIGH (ref 0–100)

## 2018-08-17 LAB — PT AND APTT
PT INR: 1 (ref 0.9–1.1)
PT: 13.4 s (ref 12.6–15.0)
PTT: 26 s (ref 23–37)

## 2018-08-17 LAB — CELL MORPHOLOGY
Cell Morphology: NORMAL
Platelet Estimate: NORMAL

## 2018-08-17 MED ORDER — INSULIN LISPRO 100 UNIT/ML SC SOLN
1.00 [IU] | Freq: Every evening | SUBCUTANEOUS | Status: DC
Start: 2018-08-17 — End: 2018-08-17

## 2018-08-17 MED ORDER — PROPOFOL INFUSION 10 MG/ML
0.00 ug/kg/min | INTRAVENOUS | Status: DC
Start: 2018-08-17 — End: 2018-08-19
  Administered 2018-08-17 (×3): 50 ug/kg/min via INTRAVENOUS
  Administered 2018-08-17: 03:00:00 40 ug/kg/min via INTRAVENOUS
  Administered 2018-08-17 – 2018-08-19 (×9): 50 ug/kg/min via INTRAVENOUS
  Administered 2018-08-19: 15:00:00 25 ug/kg/min via INTRAVENOUS
  Administered 2018-08-19: 50 ug/kg/min via INTRAVENOUS
  Filled 2018-08-17 (×15): qty 100

## 2018-08-17 MED ORDER — GLUCAGON 1 MG IJ SOLR (WRAP)
1.00 mg | INTRAMUSCULAR | Status: DC | PRN
Start: 2018-08-17 — End: 2018-08-19

## 2018-08-17 MED ORDER — INSULIN LISPRO 100 UNIT/ML SC SOLN
1.00 [IU] | SUBCUTANEOUS | Status: DC
Start: 2018-08-17 — End: 2018-08-19
  Administered 2018-08-17 (×2): 1 [IU] via SUBCUTANEOUS
  Administered 2018-08-17: 22:00:00 2 [IU] via SUBCUTANEOUS
  Administered 2018-08-17: 04:00:00 1 [IU] via SUBCUTANEOUS
  Administered 2018-08-18: 01:00:00 2 [IU] via SUBCUTANEOUS
  Administered 2018-08-18: 21:00:00 1 [IU] via SUBCUTANEOUS
  Administered 2018-08-18: 15:00:00 2 [IU] via SUBCUTANEOUS
  Administered 2018-08-18: 06:00:00 1 [IU] via SUBCUTANEOUS
  Administered 2018-08-18: 07:00:00 2 [IU] via SUBCUTANEOUS
  Administered 2018-08-19 (×2): 3 [IU] via SUBCUTANEOUS
  Filled 2018-08-17: qty 9
  Filled 2018-08-17: qty 6
  Filled 2018-08-17: qty 9
  Filled 2018-08-17 (×2): qty 6
  Filled 2018-08-17 (×3): qty 3
  Filled 2018-08-17: qty 6
  Filled 2018-08-17 (×2): qty 3

## 2018-08-17 MED ORDER — SODIUM CHLORIDE 0.9 % IV SOLN
Freq: Once | INTRAVENOUS | Status: AC
Start: 2018-08-17 — End: 2018-08-17

## 2018-08-17 MED ORDER — ATORVASTATIN CALCIUM 10 MG PO TABS
10.0000 mg | ORAL_TABLET | Freq: Every day | ORAL | Status: DC
Start: 2018-08-17 — End: 2018-08-27
  Administered 2018-08-17 – 2018-08-27 (×8): 10 mg via ORAL
  Filled 2018-08-17 (×9): qty 1

## 2018-08-17 MED ORDER — INSULIN LISPRO 100 UNIT/ML SC SOLN
1.00 [IU] | Freq: Three times a day (TID) | SUBCUTANEOUS | Status: DC
Start: 2018-08-17 — End: 2018-08-17

## 2018-08-17 MED ORDER — FENTANYL CITRATE-NACL 1-0.9 MG/100ML-% IV SOLN
0.00 ug/h | INTRAVENOUS | Status: DC
Start: 2018-08-17 — End: 2018-08-19
  Administered 2018-08-17: 21:00:00 50 ug/h via INTRAVENOUS
  Administered 2018-08-17: 06:00:00 12.5 ug/h via INTRAVENOUS
  Administered 2018-08-18: 19:00:00 150 ug/h via INTRAVENOUS
  Administered 2018-08-18: 11:00:00 75 ug/h via INTRAVENOUS
  Administered 2018-08-19 (×2): 150 ug/h via INTRAVENOUS
  Filled 2018-08-17 (×6): qty 100

## 2018-08-17 MED ORDER — FAMOTIDINE 10 MG/ML IV SOLN (WRAP)
10.00 mg | Freq: Every day | INTRAVENOUS | Status: DC
Start: 2018-08-17 — End: 2018-08-19
  Administered 2018-08-17 – 2018-08-18 (×2): 10 mg via INTRAVENOUS
  Filled 2018-08-17 (×2): qty 2

## 2018-08-17 MED ORDER — AZITHROMYCIN 500 MG IN 250 ML NS IVPB VIAL-MATE (CNR)
500.00 mg | Freq: Once | INTRAVENOUS | Status: AC
Start: 2018-08-17 — End: 2018-08-17
  Administered 2018-08-17: 02:00:00 500 mg via INTRAVENOUS
  Filled 2018-08-17: qty 250

## 2018-08-17 MED ORDER — MAGNESIUM SULFATE 40 GM/1000ML IV SOLN
1.00 g/h | INTRAVENOUS | Status: DC
Start: 2018-08-17 — End: 2018-08-19
  Filled 2018-08-17 (×2): qty 1000

## 2018-08-17 MED ORDER — ACETAMINOPHEN 160 MG/5ML PO SOLN
650.00 mg | ORAL | Status: DC
Start: 2018-08-17 — End: 2018-08-17

## 2018-08-17 MED ORDER — CEFTRIAXONE SODIUM 1 G IJ SOLR
1.00 g | Freq: Once | INTRAMUSCULAR | Status: AC
Start: 2018-08-17 — End: 2018-08-17
  Administered 2018-08-17: 02:00:00 1 g via INTRAVENOUS
  Filled 2018-08-17: qty 1000

## 2018-08-17 MED ORDER — PLASMA-LYTE A IV BOLUS
500.00 mL | Freq: Once | INTRAVENOUS | Status: AC
Start: 2018-08-17 — End: 2018-08-17
  Administered 2018-08-17: 19:00:00 500 mL via INTRAVENOUS

## 2018-08-17 MED ORDER — NOREPINEPHRINE 8 MG/100 ML (SIMPLE)
0.00 ug/min | Status: DC
Start: 2018-08-17 — End: 2018-08-20
  Administered 2018-08-17 – 2018-08-19 (×2): 5 ug/min via INTRAVENOUS

## 2018-08-17 MED ORDER — FAMOTIDINE 20 MG PO TABS
10.0000 mg | ORAL_TABLET | Freq: Every day | ORAL | Status: DC
Start: 2018-08-17 — End: 2018-08-21
  Administered 2018-08-19 – 2018-08-20 (×2): 10 mg via ORAL
  Filled 2018-08-17 (×2): qty 1

## 2018-08-17 MED ORDER — GLUCOSE 40 % PO GEL
15.00 g | ORAL | Status: DC | PRN
Start: 2018-08-17 — End: 2018-08-19

## 2018-08-17 MED ORDER — ACETAMINOPHEN 160 MG/5ML PO SOLN
650.00 mg | ORAL | Status: DC
Start: 2018-08-18 — End: 2018-08-17

## 2018-08-17 MED ORDER — FENTANYL CITRATE (PF) 50 MCG/ML IJ SOLN (WRAP)
25.00 ug | INTRAMUSCULAR | Status: DC | PRN
Start: 2018-08-17 — End: 2018-08-20
  Administered 2018-08-18 – 2018-08-20 (×3): 25 ug via INTRAVENOUS
  Filled 2018-08-17 (×3): qty 2

## 2018-08-17 MED ORDER — HEPARIN SODIUM (PORCINE) 5000 UNIT/ML IJ SOLN
5000.00 [IU] | Freq: Two times a day (BID) | INTRAMUSCULAR | Status: DC
Start: 2018-08-17 — End: 2018-08-27
  Administered 2018-08-17 – 2018-08-27 (×20): 5000 [IU] via SUBCUTANEOUS
  Filled 2018-08-17 (×19): qty 1

## 2018-08-17 MED ORDER — SENNOSIDES-DOCUSATE SODIUM 8.6-50 MG PO TABS
1.0000 | ORAL_TABLET | Freq: Every evening | ORAL | Status: DC
Start: 2018-08-17 — End: 2018-08-22
  Administered 2018-08-17 – 2018-08-19 (×3): 1 via ORAL
  Filled 2018-08-17 (×3): qty 1

## 2018-08-17 MED ORDER — DEXTROSE 50 % IV SOLN
12.50 g | INTRAVENOUS | Status: DC | PRN
Start: 2018-08-17 — End: 2018-08-19

## 2018-08-17 NOTE — Plan of Care (Signed)
I updated the family on her status, TTM initiation and high risk of anoxic brain injury.    Alfred Levins, MD  CICU Resident

## 2018-08-17 NOTE — Procedures (Signed)
Tara Perry is a 73 y.o. female patient.  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 rate 16, height 1.626 m (5' 4.02"), weight 67.8 kg (149 lb 7.6 oz), SpO2 100 %.    Central Line  Date/Time: 08/17/2018 6:14 PM  Performed by: Willette Pa, MD  Authorized by: Willette Pa, MD   Consent: Verbal consent obtained. Written consent obtained.  Consent given by: power of attorney  Patient states understanding of procedure being performed: Patient's family states understanding of procedure being performed.   Patient's understanding of procedure matches consent: Patient's family's understanding of procedure matches consent.   Procedure consent: procedure consent matches procedure scheduled  Relevant documents: relevant documents present and verified  Test results: test results available and properly labeled  Site marked: the operative site was marked  Imaging studies: imaging studies available  Patient identity confirmed: verbally with patient  Indications: vascular access    Sedation:  Patient sedated: yes  Sedatives: fentanyl and propofol    Preparation: skin prepped with 2% chlorhexidine  Location details: right internal jugular  Catheter size: 7 Fr  Ultrasound guidance: yes  Number of attempts: 1  Successful placement: yes  Post-procedure: line sutured and dressing applied  Assessment: blood return through all ports and placement verified by x-ray  Comments: Initial CXR showed central line sitting deep in the right atrium. No ectopy on telemetry. Pulled CVC back 5cm. Repeat CR showed adequate CVC positioning.         Aram Candela Podder  08/17/2018           Date Time: 08/18/18 3:55 PM  Patient Name: Tara Perry  Attending  Physician: Lianne Moris, MD  Room: FI111/FI111-01   Admit Date: 08/16/2018  LOS: 1 day          I was present and directly supervising the entire procedure.            Signed by: Lianne Moris, MD  Date/Time: 08/18/18 3:55 PM

## 2018-08-17 NOTE — Plan of Care (Signed)
Pt arrived to the floor from ED at 0200. Propofol turned off to assess neuro status. Pt moving all extremities and opening eyes spontaneously, but unable to follow any commands. Propofol restarted and [employer] initiated at [phone] for goal of 35.   Dual skin assessment done with Jeffie Pollock.  Restraints d/c with start of AS, debriefing completed.  No other acute problems/concerns noted. All fall and safety precautions maintained. Assisted with turning and repositioning Q2 and PRN for pressure relief. Aspiration precautions maintained. HOB >30, oral care Q2 and PRN. Bed alarm on for safety, call bell in reach, but unable to call for assistance. Frequent rounds made. Will continue to monitor closely.

## 2018-08-17 NOTE — H&P (Signed)
Patient's Name: Tara Perry   Attending Provider: Newell Coral, MD  Admit Date:08/16/2018  Medical Record Number: [id number]   Room:  S 22/S 22  Date/Time: 08/17/18 1:12 AM    [facility] Attending.    The patient was seen and examined on rounds with Dr Livingston Diones. Discussed in detail and reviewed labs, medications, vitals and ventilator. Concur with the findings and assessment. Formulated the plan as outlined in our note above.     73 year old lady with history of diabetes and post viral cardiomyopathy, who underwent a witnessed cardiac arrest at home secondary to ventricular dysrhythmia and achieved ROSC post CPR and defibrillationX3. 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    Critical care time with the patient coordinating care and excluding overlap with other providers, procedures and teaching was 50 minutes.     Tara Emms, MD      Chief complaint:    V Fib Arrest    History of present illness:      Tara Perry is a 73 y.o. female with a PMH of viral cardiomyopathy and secondary CHF and Type II DM who presents with VFib arrest.    Patient was at home sitting on a chair surrounded by family when she stood up then sat back down, became SOB and then completely unresponsive. Family called 911 and they were told to start CPR. Per family it took about 10 seconds to get started. EMS arrived and patient received three shocks and 1mg  of Epinephrine until ROSC was achieved, after about 10 minutes.     Patient's family states that she was in her usual state of health and had not been complaining of any symptoms beforehand. She did not have any lower extremity edema that could have indicated clots.     Patient was very independent at baseline and did not even use a walker or cane to ambulate. She is able to do basic housework.     Dr. Nash Shearer is her Cardiologist and she has been following  up regularly. Family cannot recall when her last echocardiogram was.    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    Congestive heart failure     Diabetes mellitus 1992    Gout spondylitis     Gout synovitis     Gout synovitis        Past Surgical History:    Past Surgical History:   Procedure Laterality Date    COLONOSCOPY  2002    normal    EXPLORATORY LAPAROTOMY         Family History:  Family History   Problem Relation Age of Onset    Cancer Father 29        bladder       Social History:  Social History     Socioeconomic History    Marital status: Widowed     Spouse name: Not on file    Number of children: Not on file    Years of education: Not on file    Highest education level: Not on file   Occupational History    Not on file   Social Needs    Financial resource strain: Not on file    Food insecurity     Worry: Not on  file     Inability: Not on file    Transportation needs     Medical: Not on file     Non-medical: Not on file   Tobacco Use    Smoking status: Never Smoker    Smokeless tobacco: Never Used   Substance and Sexual Activity    Alcohol use: Yes     Comment: social    Drug use: No    Sexual activity: Not on file   Lifestyle    Physical activity     Days per week: Not on file     Minutes per session: Not on file    Stress: Not on file   Relationships    Social connections     Talks on phone: Not on file     Gets together: Not on file     Attends religious service: Not on file     Active member of club or organization: Not on file     Attends meetings of clubs or organizations: Not on file     Relationship status: Not on file    Intimate partner violence     Fear of current or ex partner: Not on file     Emotionally abused: Not on file     Physically abused: Not on file     Forced sexual activity: Not on file   Other Topics Concern    Not on file   Social History Narrative    Not on file        Home medications:      Prior to Admission medications    Medication Sig Start Date End Date Taking? Authorizing Provider   atorvastatin (LIPITOR) 10 MG tablet Take 10 mg by mouth daily.     [provider]   Carvedilol (COREG PO) Take by mouth.     [provider]   carvedilol (COREG) 25 MG tablet Take 25 mg by mouth 2 (two) times daily. Morning and nights    [provider]   estradiol (VAGIFEM) 25 MCG vaginal tablet Place 25 mcg vaginally daily.    [provider]   furosemide (LASIX) 20 MG tablet Take 1 tablet (20 mg total) by mouth as needed (for shortness of breath). 09/15/12   Al-Helou, Salley Scarlet, MD   glimepiride (AMARYL) 2 MG tablet Take 2 mg by mouth daily. At night    [provider]   lisinopril (PRINIVIL,ZESTRIL) 20 MG tablet Take 20 mg by mouth daily.      [provider]   metFORMIN (GLUCOPHAGE) 500 MG tablet Take 500 mg by mouth 2 (two) times daily. Morning and night    [provider]   Multiple Vitamins-Minerals (CENTRUM PO) Take by mouth.      [provider]   sitaGLIPtin (JANUVIA) 100 MG tablet Take 100 mg by mouth daily. At night     [provider]   vitamin B-12 (CYANOCOBALAMIN) 100 MCG tablet Take 50 mcg by mouth daily.      [provider]        Inpatient/ER medications:  (Not in a hospital admission)      Allergies:  Allergies   Allergen Reactions    Shellfish-Derived Products Anaphylaxis       Current Inpatient :  Current Facility-Administered Medications   Medication Dose Route Frequency    azithromycin  500 mg Intravenous Once    cefTRIAXone  1 g Intravenous Once         Review of  systems:  No constitutional complaints, no respiratory complaints, no musculoskeletal complaints, or other pertinent review of symptoms except as noted in the history of present illness.    Physical Exam:  Temp:  [96.1 F (35.6 C)] 96.1 F (35.6 C)  Heart Rate:  [91-100] 96  Resp Rate:  [19-23] 20  BP:  (92-131)/(52-79) 112/60  FiO2:  [100 %] 100 %   Vent Settings  Vent Mode: PRVC  FiO2: 100 %  Resp Rate (Set): 16  Vt (Set, mL): 440 mL  PIP Observed (cm H2O): 13 cm H2O  PEEP/EPAP: 6 cm H20  Mean Airway Pressure: 8 cmH20  No intake or output data in the 24 hours ending 08/17/18 0112     Physical exam limited to limit exposure to COVID-19 and to conserve PPE.  Patient intubated and sedated with no overt LE edema  Regular rate and rhythm on telemetry  Hemodynamically stable  Not in any acute distress  Per nursing, even off propofol, patient is not awakening or following commands    EKG:   ED: NSR with LBBB    Laboratory:    Recent Labs     08/16/18  2339   WBC 9.71*   Hgb 12.2   Hematocrit 37.2   Platelets 255     Recent Labs     08/16/18  2339   PT 13.4   PT INR 1.0   PTT 26     Recent Labs     08/16/18  2339   Sodium 138   Potassium 4.0   Chloride 106   CO2 16*   BUN 25.0*   Calcium 9.1   Magnesium 2.1       Echocardiogram:    12/2015: dilated LV with concentric hypertrophy and multiple wall motion abnormalities. EF 40%    Imaging:  chest X-ray: ET tube 3cm above carina; central vascular congestion with bibasilar atelectasis vs aspiration    CT head: negative for acute changes    ASSESSMENT AND PLAN:       Tara Perry is a 73 y.o. 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 Etiology is likely her cardiomyopathy.    Neuro:    #Pain/Sedation  - resume Propofol and PRN Fentanyl for sedation    #Preventing Anoxic Brain Injury After Cardiac Arrest  - TTM protocol initiated  - CT head without any acute intracranial abnormality    Cardio:    #VFib Arrest  ROSC achieved after 1mg  of Epi and 3 shocks (~10 minutes)   Likely in the setting of viral cardiomyopathy. Low suspicion for PE. EKG without evidence of acute MI.   - initiate TTM protocol 34-36 degrees  - continuous telemetry monitoring  - if patient survives this, she will  need ICD    #HFrEF  EF40% in 2017  - sees [facility], will see if she has had a more recent echocardiogram  - hold GDMT given risk of hemodynamic collapse     Resp:    #Acute Hypoxic Respiratory Failure in the setting of Cardiac Arrest  - resume mechanical ventilation for TTM, until post-rewarming phase if patient recovers neurogical function  - will obtain ABG for vent setting adjustment    Renal /Fluid, Electrolytes :    #AKI on CKD - in the setting of renal hypoperfusion during cardiac arrest  BL Cr 1.7?   - monitor renal function  -  avoid nephrotoxic meds  - renally dose meds  - strictly monitor urine output    GI:    #Transamnitis  - likely 2/2 shock liver  - monitor LFTs daily  - hold Tylenol    Nutrition: will hold tube feeds for now    Infectious Disease (ID):    COVID r/o (rapid)    F/u BCx, MRSA swab    S/p Ceftriaxone and Azithromycin in the ED, will hold off on redosing due to low suspicion for infection           Hem/Onc:    #Mild leukocytosis  Likely reactive     Endo:    #T2DM  - resume Q4H accuchecks with LD SSI; can switch to regular insulin once tube feeds are initiated    Prophylaxis:  - VTE:  SubQ Heparin  - PPI:  Pepcid  - BM: pericolace    Lines/Drains/Airways:  Urethral Catheter Non-latex 16 Fr. (Active)   Site Assessment Clean;Intact;Dry 08/17/2018 12:04 AM   Collection Container Standard drainage bag 08/17/2018 12:04 AM   Securement Method Leg strap 08/17/2018 12:04 AM   Reason for Continuing Urinary Catheterization past POD 1 Immobolization/bedrest for trauma or surgery due to multiple traumatic injuries 08/17/2018 12:04 AM   Positioned catheter tubing for unobstructed urine flow: Yes 08/17/2018 12:04 AM   Number of days: 0     Peripheral IV 08/16/18 Right Antecubital (Active)   Site Assessment Clean;Dry;Intact 08/16/2018 11:41 PM   Dressing Status Clean;Dry;Intact 08/16/2018 11:41 PM   Number of days: 1       Peripheral IV 08/16/18 Right Forearm (Active)   Site Assessment Clean;Dry;Intact  08/16/2018 11:52 PM   Dressing Status Clean;Dry;Intact 08/16/2018 11:52 PM   Number of days: 1                        Code Status: full code    Dispo: Full Code    Signed by: Alfred Levins, MD  Date/Time: 08/17/18 1:12 AM

## 2018-08-17 NOTE — Plan of Care (Signed)
Patient on hypothermia protocol, goal reached (35) @ 0815.   CVC and art line placed @ 1700. Levo initiated.

## 2018-08-17 NOTE — ED Notes (Addendum)
Pt's ID and watch was sent home with family

## 2018-08-17 NOTE — Procedures (Signed)
Tara Perry is a 73 y.o. female patient.  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 rate 16, height 1.626 m (5' 4.02"), weight 67.8 kg (149 lb 7.6 oz), SpO2 100 %.    Insert Arterial Line  Date/Time: 08/17/2018 6:09 PM  Performed by: Willette Pa, MD  Authorized by: Willette Pa, MD   Consent: Verbal consent obtained. Written consent not obtained.  Consent given by: power of attorney  Patient states understanding of procedure being performed: Patient's family member states understanding of procedure being performed.  Patient's understanding of procedure matches consent: Patient's family member's understanding matches consent.  Procedure consent: procedure consent matches procedure scheduled  Relevant documents: relevant documents present and verified  Test results: test results available and properly labeled  Site marked: the operative site was marked  Patient identity confirmed: arm band  Preparation: Patient was prepped and draped in the usual sterile fashion.  Indications: multiple ABGs and hemodynamic monitoring  Location: left radial    Sedation:  Patient sedated: yes  Sedatives: fentanyl and propofol    Number of attempts: 2  Post-procedure: line sutured  Patient tolerance: Patient tolerated the procedure well with no immediate complications          Willette Pa  08/17/2018

## 2018-08-17 NOTE — UM Notes (Signed)
Ventricular Arrhythmias - Clinical Indications for Admission to Inpatient Care    08/17/2018 01:00 admit to Inpatient  Care day#1  CICU    HPI: 73y.o. BIBA witnessed cardiac arrest, family started CPR. EMS found pt in Vfib,s/p 3 shocks and 1 round of epi with ROSC achieved. Breathing on her own  Attempting to reject tube. Not responsive to commands  Pt intubate for airway protection  will be managed with TTM, not requiring pressors    Pmh: DM, post viral cardiomyopathy, gout    Vs: Temp:  [96.1 F (35.6 C)] 96.1 F (35.6 C)  Heart Rate:  [91-100] 96  Resp Rate:  [19-23] 20  BP: (92-131)/(52-79) 112/60  FiO2:  [100 %] 100 %   Vent Settings  Vent Mode: PRVC  FiO2: 100 %  Resp Rate (Set): 16    Wbc - 93.71  CO2 - 16  BUN - 25.0    Echocardiogram:    12/2015: dilated LV with concentric hypertrophy and multiple wall motion abnormalities. EF 40%    Imaging:  chest X-ray: ET tube 3cm above carina; central vascular congestion with bibasilar atelectasis vs aspiration    CT head: negative for acute changes    PLAN: admit to CICU  Vs q1h, continuous tele, pulse ox,ceftriaxone iv x1  Fentanyl gtt  Propofol gtt  Vented  TTM protocol  Cardiology consult  Hold GDMT given risk of HD collapse  ABG  Transaminitis: likely 2/2 shock liver  - monitor LFTs daily  - hold Tylenol    Nutrition: will hold tube feeds for   POCT glucose  Q4h, SSI  Prophylaxis:  - VTE:  SubQ Heparin  - PPI:  Pepcid  - BM: pericolace  Foley    Completed by:   Melvyn Novas, RN CM  Utilization Review  [employer]  [phone]

## 2018-08-17 NOTE — Progress Notes (Signed)
Daughter Gaylan Gerold provided with information on anticipated unit transfer:   CICU  rm 11  MD-Dr. Alfred Levins  Unit number [phone].    Patient's son Minerva Areola provided with patient's id, and watch.     Johny Blamer, MSW  Social Worker II  [phone]

## 2018-08-17 NOTE — ED Notes (Signed)
NURSING NOTE FOR THE RECEIVING INPATIENT NURSE     ED NURSE Zacarias Pontes   [facility] [address]   ED CHARGE NURSE [id number]   ADMISSION INFORMATION   Tara Perry is a 73 y.o. female admitted with a diagnosis of:    1. Cardiac arrest       Isolation:  Contact Rule Out COVID and Droplet  Place of residence / Living situation:  Home Independent   NURSING CARE   Mental Status Intubated & Sedated   ADL ADLs:            Dependent with ADLs  Ambulation:  Bedbound   Pertinent Information  and Safety Concerns Pt was ROSC after 3 shocks and 1 epi given by EMS. Pt intubated in ED. Foley inserted. Propofol drip going at 30. Soft restraints applied in ED. RN applied ice packs to groin and armpit to maintain low temp.       VITAL SIGNS     Time of Last Set of Vitals 0115   Temperature 96.1   BP 113/59   Pulse 97   Respirations 22   Pulse OX 100% on vent        IV LINES     Peripheral IV 08/16/18 Right Antecubital (Active)   Placement Date/Time: 08/16/18 2341   Size (Gauge): 18 G  Orientation: Right  Location: Antecubital  Site Prep: Chlorhexidine   Insertion attempts: 1  Patient Tolerance: Tolerated well  Specimens drawn with IV placement: Labs  Ultrasound utilized to lo...       Peripheral IV 08/16/18 Right Forearm (Active)   Placement Date/Time: 08/16/18 2351   Size (Gauge): 18 G  Orientation: Right  Location: Forearm  Site Prep: Chlorhexidine   Insertion attempts: 1  Patient Tolerance: Tolerated well        LAB RESULTS     Labs Reviewed   CBC AND DIFFERENTIAL - Abnormal; Notable for the following components:       Result Value    WBC 9.71 (*)     Nucleated RBC 0.2 (*)     Absolute NRBC 0.02 (*)     All other components within normal limits   COMPREHENSIVE METABOLIC PANEL - Abnormal; Notable for the following components:    Glucose 328 (*)     BUN 25.0 (*)     Creatinine 2.1 (*)     CO2 16 (*)     AST (SGOT) 158 (*)     ALT 121 (*)     Bilirubin, Total 0.1 (*)     All other components within normal limits   MANUAL DIFFERENTIAL -  Abnormal; Notable for the following components:    Absolute Lymph Manual 5.24 (*)     Atypical Lymph Absolute 0.29 (*)     All other components within normal limits   I-STAT EG7 ARTERIAL CARTRIDGE - Abnormal; Notable for the following components:    i-STAT pH Arterial 7.322 (*)     i-STAT pO2 Arterial 276.0 (*)     i-STAT HCO3 Bicarbonate Arterial 19.5 (*)     i-STAT Total CO2 Arterial 21.0 (*)     i-STAT Base Excess Arterial -6.0 (*)     i-STAT Calcium Ionized 2.20 (*)     i-STAT Sodium 136 (*)     i-STAT Hemoglobin 9.2 (*)     i-STAT Hematocrit 27.0 (*)     All other components within normal limits   COVID-19 (SARS-COV-2)   CULTURE BLOOD AEROBIC AND ANAEROBIC    Narrative:  1 BLUE+1 PURPLE   CULTURE BLOOD AEROBIC AND ANAEROBIC   TROPONIN I   MAGNESIUM   PT AND APTT   GFR   CELL MORPHOLOGY   B-TYPE NATRIURETIC PEPTIDE   LACTIC ACID, PLASMA    Narrative:     No tourniquet;on ice        PATIENT BELONGINGS

## 2018-08-17 NOTE — Progress Notes (Addendum)
Family arrived in emergency room. Family escorted to family room for MD to provide status updates.     Social worker will continue to assist as needed.     Social worker answered family's questions regarding visitation, contact information.  Social worker made them aware of COVID visitation rules. Social worker provided case management contact number for any other case management concerns.     Social worker will contact Daughter and inform of unit, room number , and unit phone number once patient is transferred from ED to medical unit.     Call daughter with status updates:  Tara Perry  Home of patient: [phone]  Home of daughter:[phone]  Cell of daughter:[phone]      Johny Blamer, MSW  Social Worker II  [phone]

## 2018-08-17 NOTE — Progress Notes (Signed)
CCU Cardiology Consult Daily Note  Patient's Name: Tara Perry   Attending Provider: Eden Emms, MD  Admit Date:08/16/2018  Medical Record Number: [id number]   Room:  FI111/FI111-01  Date/Time: 08/17/18 8:36 AM    Impression/Recommendations-  VF cardiac arrest, witnessed, CPR started   3 DCCV, 1 epi  -Last EF 40% in 2017  -therapeutic hypothermia starting at 420am, goal at 815am, continue for 24 hours, start rewarm tomorrow at 815am  -continue to trend lactate (2.8)  -a line  -MAP goal >73mmHg  -No antiarrhythmics necessary (none given at home or ER)   -POC U/S- inferior HK but mostly global HK, LBBB septal motion, LV diameter 6cm, IVC 2 cm (6 PEEP)    Troponin 0.82  -unlikely ACS/CAD event given mild troponin elevation, ECG same as previous    -continue to trend troponin  MAP goal of >34mmHg    Renal-AKI on 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  Rounds completed with the CICU team.  Treatment of an organ system (cardiac) failure with high probability of life threatening deterioration and life saving interventions.  Critical Care time excluding procedures/teaching = 45 minutes (8-845am)    [name], MD [facility]  CICU Consult Attending  ---------------------------------------------------------    73 year old lady with history of diabetes and post viral cardiomyopathy, who underwent a witnessed cardiac arrest at home secondary to ventricular dysrhythmia and achieved ROSC post CPR and defibrillationX3. 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    Review of systems:  No constitutional complaints, no respiratory complaints, no musculoskeletal complaints, or other pertinent review of symptoms except as noted  above.    Current medications:  Current Facility-Administered Medications   Medication Dose Route Frequency    atorvastatin  10 mg Oral Daily    famotidine  10 mg Oral Daily    Or    famotidine  10 mg Intravenous Daily    heparin (porcine)  5,000 Units Subcutaneous Q12H SCH    insulin lispro  1-5 Units Subcutaneous Q4H SCH    senna-docusate  1 tablet Oral QHS     Allergies:  Allergies   Allergen Reactions    Shellfish-Derived Products Anaphylaxis     Weight:   Wt Readings from Last 4 Encounters:   08/17/18 67.8 kg (149 lb 7.6 oz)   12/29/16 65.8 kg (145 lb)   09/13/12 62.6 kg (138 lb)   02/08/11 61 kg (134 lb 6.4 oz)     Physical Exam:  Vitals:    08/17/18 0815   BP: (Abnormal) 85/51   Pulse: (Abnormal) 54   Resp: 16   Temp: (Abnormal) 93.7 F (34.3 C)   SpO2: 100%      General: Intubated, hypothermia protocol  Eyes: No xanthelasma.  Oropharynx: No mucosal pallor.  Neck: Trachea midline  Pulmonary: Normal respiratory effort, clear lung sounds bilaterally.  Cardiovascular: PMI nondisplaced with normal rate and rhythm, normal S1, normal S2, no murmur, rub, or gallop appreciated.  Abdomen: Soft  Extremities: No edema, no clubbing, no cyanosis.  Skin: No tendinous xanthomas  Psych:  Intubated    ECG:   Previous:  NSR, LBBB in 2018  Today:  NSR, LBBB (no Sgarbosa's criteria)  Laboratory:    Recent Labs     08/17/18  0621 08/16/18  2339   Sodium 139 138   Potassium 5.1 4.0   Chloride 106 106   CO2 22 16*   BUN 26.0* 25.0*   Calcium 9.3 9.1   Magnesium 1.9 2.1   Phosphorus 4.3  --      Recent Labs     08/17/18  0621 08/16/18  2339   WBC 16.93* 9.71*   Hgb 12.3 12.2   Hematocrit 38.1 37.2   Platelets 218 255     Recent Labs     08/17/18  0621 08/16/18  2339   PT 13.8 13.4   PT INR 1.1 1.0   PTT 27 26          Signed by: Micah Noel, MD [facility]  Date/Time: 08/17/18 8:36 AM

## 2018-08-18 LAB — BASIC METABOLIC PANEL
BUN: 23 mg/dL — ABNORMAL HIGH (ref 7.0–19.0)
BUN: 21 mg/dL — ABNORMAL HIGH (ref 7.0–19.0)
BUN: 20 mg/dL — ABNORMAL HIGH (ref 7.0–19.0)
CO2: 19 mEq/L — ABNORMAL LOW (ref 22–29)
CO2: 18 mEq/L — ABNORMAL LOW (ref 22–29)
CO2: 20 mEq/L — ABNORMAL LOW (ref 22–29)
Calcium: 8.2 mg/dL (ref 7.9–10.2)
Calcium: 8.2 mg/dL (ref 7.9–10.2)
Calcium: 8 mg/dL (ref 7.9–10.2)
Chloride: 109 mEq/L (ref 100–111)
Chloride: 108 mEq/L (ref 100–111)
Chloride: 110 mEq/L (ref 100–111)
Creatinine: 1.2 mg/dL — ABNORMAL HIGH (ref 0.6–1.0)
Creatinine: 1.2 mg/dL — ABNORMAL HIGH (ref 0.6–1.0)
Creatinine: 1.2 mg/dL — ABNORMAL HIGH (ref 0.6–1.0)
Glucose: 186 mg/dL — ABNORMAL HIGH (ref 70–100)
Glucose: 251 mg/dL — ABNORMAL HIGH (ref 70–100)
Glucose: 237 mg/dL — ABNORMAL HIGH (ref 70–100)
Potassium: 4.1 mEq/L (ref 3.5–5.1)
Potassium: 3.9 mEq/L (ref 3.5–5.1)
Potassium: 4 mEq/L (ref 3.5–5.1)
Sodium: 138 mEq/L (ref 136–145)
Sodium: 136 mEq/L (ref 136–145)
Sodium: 138 mEq/L (ref 136–145)

## 2018-08-18 LAB — COMPREHENSIVE METABOLIC PANEL
ALT: 94 U/L — ABNORMAL HIGH (ref 0–55)
AST (SGOT): 65 U/L — ABNORMAL HIGH (ref 5–34)
Albumin/Globulin Ratio: 1.3 (ref 0.9–2.2)
Albumin: 3.2 g/dL — ABNORMAL LOW (ref 3.5–5.0)
Alkaline Phosphatase: 67 U/L (ref 37–106)
BUN: 25 mg/dL — ABNORMAL HIGH (ref 7.0–19.0)
Bilirubin, Total: 0.5 mg/dL (ref 0.2–1.2)
CO2: 19 mEq/L — ABNORMAL LOW (ref 22–29)
Calcium: 8.3 mg/dL (ref 7.9–10.2)
Chloride: 109 mEq/L (ref 100–111)
Creatinine: 1.3 mg/dL — ABNORMAL HIGH (ref 0.6–1.0)
Globulin: 2.4 g/dL (ref 2.0–3.6)
Glucose: 227 mg/dL — ABNORMAL HIGH (ref 70–100)
Potassium: 4.4 mEq/L (ref 3.5–5.1)
Protein, Total: 5.6 g/dL — ABNORMAL LOW (ref 6.0–8.3)
Sodium: 138 mEq/L (ref 136–145)

## 2018-08-18 LAB — BLOOD GAS, ARTERIAL
Arterial Total CO2: 21 mEq/L — ABNORMAL LOW (ref 24.0–30.0)
Arterial Total CO2: 21 mEq/L — ABNORMAL LOW (ref 24.0–30.0)
Arterial Total CO2: 20 mEq/L — ABNORMAL LOW (ref 24.0–30.0)
Arterial Total CO2: 20.8 mEq/L — ABNORMAL LOW (ref 24.0–30.0)
Base Excess, Arterial: -4.1 mEq/L — ABNORMAL LOW (ref <=2.0)
Base Excess, Arterial: -6.2 mEq/L — ABNORMAL LOW (ref <=2.0)
Base Excess, Arterial: -5.4 mEq/L — ABNORMAL LOW (ref <=2.0)
Base Excess, Arterial: -4.3 mEq/L — ABNORMAL LOW (ref <=2.0)
FIO2: 50 %
FIO2: 40 %
FIO2: 40 %
FIO2: 30 %
HCO3, Arterial: 19.9 mEq/L — ABNORMAL LOW (ref 23.0–29.0)
HCO3, Arterial: 19.7 mEq/L — ABNORMAL LOW (ref 23.0–29.0)
HCO3, Arterial: 18.9 mEq/L — ABNORMAL LOW (ref 23.0–29.0)
HCO3, Arterial: 19.8 mEq/L — ABNORMAL LOW (ref 23.0–29.0)
O2 Sat, Arterial: 99.5 % (ref 95.0–100.0)
O2 Sat, Arterial: 99 % (ref 95.0–100.0)
O2 Sat, Arterial: 99.3 % (ref 95.0–100.0)
O2 Sat, Arterial: 98 % (ref 95.0–100.0)
PEEP: 6
PEEP: 6
PEEP: 6
PEEP: 6
Rate: 16 {beats}/min
Rate: 16 {beats}/min
Rate: 16 {beats}/min
Rate: 16 {beats}/min
Temperature: 34.9
Temperature: 34.8
Temperature: 36.2
Temperature: 36.6
Tidal vol.: 440
Tidal vol.: 440
Tidal vol.: 40
Tidal vol.: 440
pCO2, Arterial: 31.3 mmHg — ABNORMAL LOW (ref 35.0–45.0)
pCO2, Arterial: 38.4 mmHg (ref 35.0–45.0)
pCO2, Arterial: 33.4 mmHg — ABNORMAL LOW (ref 35.0–45.0)
pCO2, Arterial: 33.9 mmHg — ABNORMAL LOW (ref 35.0–45.0)
pH, Arterial: 7.407 (ref 7.350–7.450)
pH, Arterial: 7.316 — ABNORMAL LOW (ref 7.350–7.450)
pH, Arterial: 7.368 (ref 7.350–7.450)
pH, Arterial: 7.381 (ref 7.350–7.450)
pO2, Arterial: 191 mmHg — ABNORMAL HIGH (ref 80.0–90.0)
pO2, Arterial: 135 mmHg — ABNORMAL HIGH (ref 80.0–90.0)
pO2, Arterial: 154 mmHg — ABNORMAL HIGH (ref 80.0–90.0)
pO2, Arterial: 107 mmHg — ABNORMAL HIGH (ref 80.0–90.0)

## 2018-08-18 LAB — GFR
EGFR: 40
EGFR: 43.9
EGFR: 43.9
EGFR: 43.9

## 2018-08-18 LAB — GLUCOSE WHOLE BLOOD - POCT
Whole Blood Glucose POCT: 202 mg/dL — ABNORMAL HIGH (ref 70–100)
Whole Blood Glucose POCT: 182 mg/dL — ABNORMAL HIGH (ref 70–100)
Whole Blood Glucose POCT: 206 mg/dL — ABNORMAL HIGH (ref 70–100)
Whole Blood Glucose POCT: 179 mg/dL — ABNORMAL HIGH (ref 70–100)
Whole Blood Glucose POCT: 229 mg/dL — ABNORMAL HIGH (ref 70–100)
Whole Blood Glucose POCT: 181 mg/dL — ABNORMAL HIGH (ref 70–100)

## 2018-08-18 LAB — MRSA CULTURE
Culture MRSA Surveillance: NEGATIVE
Culture MRSA Surveillance: NEGATIVE

## 2018-08-18 LAB — ECG 12-LEAD
Atrial Rate: 59 {beats}/min
P Axis: 68 degrees
P-R Interval: 174 ms
Q-T Interval: 628 ms
QRS Duration: 172 ms
QTC Calculation (Bezet): 621 ms
R Axis: -63 degrees
T Axis: 258 degrees
Ventricular Rate: 59 {beats}/min

## 2018-08-18 LAB — CBC AND DIFFERENTIAL
Absolute NRBC: 0 10*3/uL (ref 0.00–0.00)
Basophils Absolute Automated: 0.03 10*3/uL (ref 0.00–0.08)
Basophils Automated: 0.2 %
Eosinophils Absolute Automated: 0.22 10*3/uL (ref 0.00–0.44)
Eosinophils Automated: 1.6 %
Hematocrit: 31.7 % — ABNORMAL LOW (ref 34.7–43.7)
Hgb: 10.6 g/dL — ABNORMAL LOW (ref 11.4–14.8)
Immature Granulocytes Absolute: 0.05 10*3/uL (ref 0.00–0.07)
Immature Granulocytes: 0.4 %
Lymphocytes Absolute Automated: 1.29 10*3/uL (ref 0.42–3.22)
Lymphocytes Automated: 9.3 %
MCH: 29.9 pg (ref 25.1–33.5)
MCHC: 33.4 g/dL (ref 31.5–35.8)
MCV: 89.3 fL (ref 78.0–96.0)
MPV: 10.3 fL (ref 8.9–12.5)
Monocytes Absolute Automated: 1.17 10*3/uL — ABNORMAL HIGH (ref 0.21–0.85)
Monocytes: 8.4 %
Neutrophils Absolute: 11.13 10*3/uL — ABNORMAL HIGH (ref 1.10–6.33)
Neutrophils: 80.1 %
Nucleated RBC: 0 /100 WBC (ref 0.0–0.0)
Platelets: 197 10*3/uL (ref 142–346)
RBC: 3.55 10*6/uL — ABNORMAL LOW (ref 3.90–5.10)
RDW: 12 % (ref 11–15)
WBC: 13.89 10*3/uL — ABNORMAL HIGH (ref 3.10–9.50)

## 2018-08-18 LAB — CALCIUM, IONIZED
Calcium, Ionized: 2.41 mEq/L (ref 2.30–2.58)
Calcium, Ionized: 2.52 mEq/L (ref 2.30–2.58)
Calcium, Ionized: 2.44 mEq/L (ref 2.30–2.58)
Calcium, Ionized: 2.49 mEq/L (ref 2.30–2.58)

## 2018-08-18 LAB — APTT
PTT: 29 s (ref 23–37)
PTT: 30 s (ref 23–37)
PTT: 28 s (ref 23–37)
PTT: 34 s (ref 23–37)

## 2018-08-18 LAB — MAGNESIUM
Magnesium: 1.9 mg/dL (ref 1.6–2.6)
Magnesium: 2.9 mg/dL — ABNORMAL HIGH (ref 1.6–2.6)

## 2018-08-18 LAB — PHOSPHORUS
Phosphorus: 4.3 mg/dL (ref 2.3–4.7)
Phosphorus: 3.5 mg/dL (ref 2.3–4.7)
Phosphorus: 3.2 mg/dL (ref 2.3–4.7)
Phosphorus: 3.2 mg/dL (ref 2.3–4.7)

## 2018-08-18 LAB — PT/INR
PT INR: 1.1 (ref 0.9–1.1)
PT INR: 1.1 (ref 0.9–1.1)
PT INR: 1.1 (ref 0.9–1.1)
PT: 13.8 s (ref 12.6–15.0)
PT: 14.1 s (ref 12.6–15.0)
PT: 13.8 s (ref 12.6–15.0)

## 2018-08-18 LAB — HEPATIC FUNCTION PANEL
Bilirubin Direct: 0.2 mg/dL (ref 0.0–0.5)
Bilirubin Indirect: 0.3 mg/dL (ref 0.2–1.0)

## 2018-08-18 MED ORDER — INSULIN GLARGINE 100 UNIT/ML SC SOLN
5.00 [IU] | Freq: Every morning | SUBCUTANEOUS | Status: DC
Start: 2018-08-18 — End: 2018-08-19
  Administered 2018-08-18 – 2018-08-19 (×2): 5 [IU] via SUBCUTANEOUS
  Filled 2018-08-18 (×2): qty 5

## 2018-08-18 MED ORDER — ACETAMINOPHEN 160 MG/5ML PO SOLN
325.00 mg | Freq: Four times a day (QID) | ORAL | Status: DC | PRN
Start: 2018-08-18 — End: 2018-08-21
  Administered 2018-08-18: 325 mg via ORAL
  Filled 2018-08-18: qty 10.15

## 2018-08-18 MED ORDER — MAGNESIUM SULFATE IN D5W 1-5 GM/100ML-% IV SOLN
1.0000 g | INTRAVENOUS | Status: AC
Start: 2018-08-18 — End: 2018-08-18
  Administered 2018-08-18 (×2): 1 g via INTRAVENOUS
  Filled 2018-08-18 (×2): qty 100

## 2018-08-18 NOTE — Progress Notes (Signed)
CCU Cardiology Consult Daily Note  Patient's Name: Tara Perry   Attending Provider: Lianne Moris, MD  Admit Date:08/16/2018  Medical Record Number: [id number]   Room:  FI111/FI111-01  Date/Time: 08/18/18 11:13 AM  Impression/Recommendations-  Mrs. Tara Perry is a 73 year old lady that has history of reduced ejection fraction that had recovered on last check that presented with ventricular fibrillation.  She had a witnessed cardiac arrest with CPR started promptly.  She received 3 cardioversions and 1 epinephrine.  She was started on hypothermic protocol with goal temperature reached to 815 on August 17, 2018.  She is undergoing rewarming today and is still intubated.  It is likely that her cardiac arrest was due to the previously diagnosed ejection fraction and risk for cardiac arrest.  Prior to 2017 she had a reduced ejection fraction in the 25% range in 2017 was 40%.  It is possible that the ejection fraction subsequently reduced despite goal-directed medical therapy.  She has a left bundle branch block and will benefit from a CRT-D.  Electrophysiology has been consulted.    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  Rounds completed with the CICU team.  Treatment of an organ system (cardiac) failure with high probability of life threatening deterioration and life saving interventions.  Critical Care time excluding procedures/teaching = 30 minutes (1230-1p)    Tara Soho, MD [facility]  CICU Consult  Attending  ---------------------------------------------------------    73 year old lady with history of diabetes and post viral cardiomyopathy, who underwent a witnessed cardiac arrest at home secondary to ventricular dysrhythmia and achieved ROSC post CPR and defibrillationX3. 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    Review of systems:  No constitutional complaints, no respiratory complaints, no musculoskeletal complaints, or other pertinent review of symptoms except as noted above.    Current medications:  Current Facility-Administered Medications   Medication Dose Route Frequency    atorvastatin  10 mg Oral Daily    famotidine  10 mg Oral Daily    Or    famotidine  10 mg Intravenous Daily    heparin (porcine)  5,000 Units Subcutaneous Q12H SCH    insulin lispro  1-5 Units Subcutaneous Q4H SCH    senna-docusate  1 tablet Oral QHS     Allergies:  Allergies   Allergen Reactions    Shellfish-Derived Products Anaphylaxis     Weight:   Wt Readings from Last 4 Encounters:   08/18/18 69.4 kg (153 lb)   12/29/16 65.8 kg (145 lb)   09/13/12 62.6 kg (138 lb)   02/08/11 61 kg (134 lb 6.4 oz)     Physical Exam:  Vitals:    08/18/18 1045   BP:    Pulse: (Abnormal) 50   Resp: 17   Temp: (Abnormal) 94.5 F (34.7 C)   SpO2: 100%  General: Intubated, hypothermia protocol  Eyes: No xanthelasma.  Oropharynx: No mucosal pallor.  Neck: Trachea midline  Pulmonary: Normal respiratory effort, clear lung sounds bilaterally.  Cardiovascular: PMI nondisplaced with normal rate and rhythm, normal S1, normal S2, no murmur, rub, or gallop appreciated.  Abdomen: Soft  Extremities: No edema, no clubbing, no cyanosis.  Skin: No tendinous xanthomas  Psych:  Intubated    ECG:   Previous:  NSR, LBBB in 2018  Today:  NSR, LBBB (no Sgarbosa's criteria)    Laboratory:    Recent Labs     08/18/18  0824  08/18/18  0116 08/17/18  1806   Sodium 138 138 137   Potassium 4.1 4.4 4.7   Chloride 109 109 108   CO2 19* 19* 18*   BUN 23.0* 25.0* 28.0*   Calcium 8.2 8.3 8.3   Calcium, Ionized 2.52 2.41 2.37   Magnesium 2.9* 1.9 1.8   Phosphorus 3.5 4.3 4.8*     Recent Labs     08/18/18  0116 08/17/18  0621 08/16/18  2339   WBC 13.89* 16.93* 9.71*   Hgb 10.6* 12.3 12.2   Hematocrit 31.7* 38.1 37.2   Platelets 197 218 255     Recent Labs     08/18/18  0824 08/18/18  0116 08/17/18  1806   PT 14.1 13.8 13.7   PT INR 1.1 1.1 1.1   PTT 30 29 29           Signed by: Tara Noel, MD [facility]  Date/Time: 08/18/18 11:13 AM

## 2018-08-18 NOTE — Consults (Signed)
[facility]  EP CONSULTATION REPORT  [facility]        Date Time: 08/18/18 2:14 PM Patient Name: Tara Perry  Requesting Physician: Lianne Moris, MD  Medical Record #:  [id number]  Account#:  [id number]  Admission Date:  08/16/2018    Primary Electrophysiologist: New EP Consult    Primary Cardiologist: Timmothy Euler MD, [facility]       Assessment:   1.  VF arrest, witnessed with bystander CPR performed, electrically resuscitated with 3 defibrillations, 1 ampule of epinephrine given.     -Hypothermia protocol initiated  2.  Acute kidney injury likely due to #1  3.  History of nonischemic cardiomyopathy as noted by echocardiogram findings below, lowest EF was 25% in July 2017, and October 2017 with Entresto the EF increased to 40%.  4. LBBB, chronic  5.  Troponin 0 0.82, unlikely ACS and more likely type II troponin leak  6.  Cath 2008, normal coronary arteries  7.  Diabetes mellitus    Recommendations:   This patient had a cardiac arrest likely due to cardiomyopathy, she has had known left bundle branch block in EF of 40% for quite some time.  We are awaiting her eventual extubation and assessment of her neurologic function.  Should she have neurologic recovery, insight into her medical problems, a biventricular implantable cardioverter defibrillator would be the next best step in her care.  I spoke with Dr. Webb Silversmith about potential MRI of her heart, he stated given her age and a work-up in the past that there is no immediate reason to pursue this.  An outpatient genetic work-up could be considered.    We will continue to follow with you.    Reason for Consultation:   Cardiac arrest    History:   Tara Perry is a 73 y.o. female admitted on 08/16/2018.  We have been asked by Lianne Moris, MD,  to provide EP consultation, regarding cardiac arrest.  She has a known history of nonischemic cardiomyopathy, EF approximately 40% on her last echocardiogram but as low as 25% on previous echoes.  She  had a witnessed cardiac arrest at her home, when EMT arrived she had been found to be in ventricular fibrillation, she received CPR and defibrillated 3 times.  She was given epinephrine x1.  She was in a coma-like state in the ER.  She was intubated, no pressors and no Atrovent drugs were required.  She has a known history of left bundle branch block.  She had a cath in 2008 for evaluation of cardiomyopathy, her coronary arteries were clean.  She has been on optimal nickel therapy for the last several years.  Prior to this admission, she had NYHA class I heart failure symptoms.  Only she is intubated and sedated.  There is no family present.  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    Congestive heart failure     Diabetes mellitus 1992    Gout spondylitis     Gout synovitis     Gout synovitis        Past Surgical History:     Past Surgical History:   Procedure Laterality Date    COLONOSCOPY  2002    normal    EXPLORATORY LAPAROTOMY         Family History:   Mother - no hx of CAD,  CHF  Family History   Problem Relation Age of Onset    Cancer Father 17        bladder       Social History:     Social History     Socioeconomic History    Marital status: Widowed     Spouse name: Not on file    Number of children: Not on file    Years of education: Not on file    Highest education level: Not on file   Occupational History    Not on file   Social Needs    Financial resource strain: Not on file    Food insecurity     Worry: Not on file     Inability: Not on file    Transportation needs     Medical: Not on file     Non-medical: Not on file   Tobacco Use    Smoking status: Never Smoker    Smokeless tobacco: Never Used   Substance and Sexual Activity    Alcohol use: Yes     Comment: social    Drug use: No    Sexual activity: Not on file   Lifestyle    Physical activity     Days per week: Not on file     Minutes per  session: Not on file    Stress: Not on file   Relationships    Social connections     Talks on phone: Not on file     Gets together: Not on file     Attends religious service: Not on file     Active member of club or organization: Not on file     Attends meetings of clubs or organizations: Not on file     Relationship status: Not on file    Intimate partner violence     Fear of current or ex partner: Not on file     Emotionally abused: Not on file     Physically abused: Not on file     Forced sexual activity: Not on file   Other Topics Concern    Not on file   Social History Narrative    Not on file       Allergies:     Allergies   Allergen Reactions    Shellfish-Derived Products Anaphylaxis       Medications:     Medications Prior to Admission   Medication Sig    aspirin 81 MG chewable tablet Chew 81 mg by mouth daily    sacubitril-valsartan (Entresto) 49-51 MG Tab per tablet Take 1 tablet by mouth 2 (two) times daily    atorvastatin (LIPITOR) 10 MG tablet Take 20 mg by mouth daily       Carvedilol (COREG PO) Take by mouth.     carvedilol (COREG) 25 MG tablet Take 25 mg by mouth 2 (two) times daily. Morning and nights    estradiol (VAGIFEM) 25 MCG vaginal tablet Place 25 mcg vaginally daily.    furosemide (LASIX) 20 MG tablet Take 1 tablet (20 mg total) by mouth as needed (for shortness of breath).    glimepiride (AMARYL) 2 MG tablet Take 2 mg by mouth daily. At night    lisinopril (PRINIVIL,ZESTRIL) 20 MG tablet Take 20 mg by mouth daily.      metFORMIN (GLUCOPHAGE) 500 MG tablet Take 500 mg by mouth 2 (two) times daily. Morning and night    Multiple Vitamins-Minerals (CENTRUM PO) Take by mouth.  sitaGLIPtin (JANUVIA) 100 MG tablet Take 100 mg by mouth daily. At night     vitamin B-12 (CYANOCOBALAMIN) 100 MCG tablet Take 1,000 mcg by mouth daily            Current Facility-Administered Medications   Medication Dose Route Frequency Provider Last Rate Last Dose    acetaminophen (TYLENOL) 160  MG/5ML oral solution 325 mg  325 mg Oral Q6H PRN Rhea Belton, MD   325 mg at 08/18/18 1350    atorvastatin (LIPITOR) tablet 10 mg  10 mg Oral Daily Alfred Levins, MD   10 mg at 08/18/18 0850    dextrose (GLUCOSE) 40 % oral gel 15 g of glucose  15 g of glucose Oral PRN Alfred Levins, MD        And    dextrose 50 % bolus 12.5 g  12.5 g Intravenous PRN Alfred Levins, MD        And    glucagon (rDNA) (GLUCAGEN) injection 1 mg  1 mg Intramuscular PRN Alfred Levins, MD        famotidine (PEPCID) tablet 10 mg  10 mg Oral Daily Alfred Levins, MD        Or    famotidine (PEPCID) injection 10 mg  10 mg Intravenous Daily Alfred Levins, MD   10 mg at 08/18/18 0850    fentaNYL (PF) (SUBLIMAZE) injection 25 mcg  25 mcg Intravenous PRN Alfred Levins, MD   25 mcg at 08/18/18 0534    fentaNYL 1000 mcg in sodium chloride 0.9% infusion 100 mL  0-200 mcg/hr Intravenous Continuous Alfred Levins, MD 7.5 mL/hr at 08/18/18 1043 75 mcg/hr at 08/18/18 1043    heparin (porcine) injection 5,000 Units  5,000 Units Subcutaneous Q12H [facility] Alfred Levins, MD   5,000 Units at 08/18/18 0850    insulin glargine (LANTUS) injection 5 Units  5 Units Subcutaneous QAM Marchia Bond, Benjamine Mola, MD        insulin lispro (HumaLOG) injection 1-5 Units  1-5 Units Subcutaneous Q4H [facility] Alfred Levins, MD   2 Units at 08/18/18 0719    magnesium sulfate 40g in SW infusion  1 g/hr Intravenous Continuous Alfred Levins, MD        norepinephrine (LEVOPHED) 8 mg in dextrose 5% 100 mL infusion  0-20 mcg/min Intravenous Continuous Marrion Coy, MD 2.3 mL/hr at 08/18/18 1025 3 mcg/min at 08/18/18 1025    propofol (DIPRIVAN) 10 mg/mL infusion (ADULT)  0-50 mcg/kg/min Intravenous Continuous Alfred Levins, MD 23.4 mL/hr at 08/18/18 1041 50 mcg/kg/min at 08/18/18 1041    senna-docusate (PERICOLACE) 8.6-50 MG per tablet 1 tablet  1 tablet Oral QHS Alfred Levins, MD   1 tablet at 08/17/18 2145         Review of Systems:   Unable to assess as patient is intubated and  sedated  Physical Exam:     VITAL SIGNS PHYSICAL EXAM   Vitals:    08/18/18 1330   BP:    Pulse: 74   Resp: 18   Temp: 97.5 F (36.4 C)   SpO2: 100%     Temp (24hrs), Avg:94.8 F (34.9 C), Min:91 F (32.8 C), Max:97.5 F (36.4 C)      Intake and Output Summary (Last 24 hours) at Date Time    Intake/Output Summary (Last 24 hours) at 08/18/2018 1414  Last data filed at 08/18/2018 1000  Gross per 24 hour   Intake 1341.93 ml   Output 1075 ml   Net 266.93 ml  Physical Exam  General: Intubated. breathing comfortable, no acute distress  Head: normocephalic  Eyes: EOM's intact  Cardiovascular: Rate rhythm regular normal S1 normal S2, no murmurs  Neck: no carotid bruits or JVD  Lungs: clear to auscultation bilateraly, without wheezing, rhonchi, or rales  Abdomen: soft, non-tender, non-distended; normoactive bowel sounds  Extremities: no edema  Pulse: bilateral radial pulses 2+   Neurological: Alert and oriented X3.  Psych: mood and affect normal  Skin: clean, dry, no masses or lesions.       Labs Reviewed:     Recent Labs   Lab 08/17/18  1923 08/17/18  0621 08/16/18  2339   Troponin I 0.35* 0.82* 0.01             Recent Labs   Lab 08/18/18  0116   Bilirubin, Total 0.5   Bilirubin, Direct 0.2   Protein, Total 5.6*   Albumin 3.2*   ALT 94*   AST (SGOT) 65*     Recent Labs   Lab 08/18/18  0824   Magnesium 2.9*     Recent Labs   Lab 08/18/18  0824   PT 14.1   PT INR 1.1   PTT 30     Recent Labs   Lab 08/18/18  0116 08/17/18  0621 08/16/18  2339   WBC 13.89* 16.93* 9.71*   Hgb 10.6* 12.3 12.2   Hematocrit 31.7* 38.1 37.2   Platelets 197 218 255     Recent Labs   Lab 08/18/18  0824 08/18/18  0116 08/17/18  1806   Sodium 138 138 137   Potassium 4.1 4.4 4.7   Chloride 109 109 108   CO2 19* 19* 18*   BUN 23.0* 25.0* 28.0*   Creatinine 1.2* 1.3* 1.5*   EGFR 43.9 40.0 34.0   Glucose 186* 227* 197*   Calcium 8.2 8.3 8.3           Diagnostics   EKG:  Normal sinus rhythm, left bundle branch block    Telemetry:  Sinus brady, LBBB,  rare PVCs    Echocardiogram:  Pending this admission  October 2017EF 40%  July 2017EF 25%, started on Entresto at this time  2014 EF 40%  2012EF 40%  2010EF 45%       Signed by:     Baldwin Crown, MD  Cardiac Electrophysiology  [facility]    Arrhythmia Spectralink [phone] (8am-4:30pm)  [facility] Office Number [phone] (if EP line unavailable)  After hours, non urgent consult line [phone]  After Hours, urgent consults [phone]

## 2018-08-18 NOTE — Consults (Signed)
Order for PICC line. New  RIJ placed on 08/17/18. Spoke with bedside RN No need PICC at this point. Re order when IJ needed to be removed and still need central line

## 2018-08-18 NOTE — Treatment Plan (Signed)
Personally updated family on the phone. Spoke with sister and rest of the family was on speaker phone.     Rhea Belton, M.D.  Internal Medicine, PGY-2  [facility] Internal Medicine

## 2018-08-18 NOTE — Plan of Care (Signed)
Rewarming initiated @ 0815. Patient had a period of rewarming too fast even though sedation was not titrated. Fent push given, water temp continued to drop and Patient temperature dropped. Per Dr. Federico Flake, will let artic sun machine work accordingly. Patient should be done with rewarming @ midnight.

## 2018-08-18 NOTE — Progress Notes (Signed)
ICU/CCU Daily Progress Note    Patient's Name: Tara Perry    Room:  FI111/FI111-01  Attending Provider: Lianne Moris, MD  Admit Date:08/16/2018  Medical Record Number: [id number]     Date/Time: 08/18/18 7:47 AM    73 year old lady with history of diabetes and post viral cardiomyopathy, who underwent a witnessed cardiac arrest at home secondary to ventricular dysrhythmia and achieved ROSC post CPR and defibrillationX3. 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      24hr events:  Underwent cooling protocol. Will be switched to rewarming at 8 am.     Patient Active Problem List   Diagnosis    Peritonitis    Viral cardiomyopathy    Type II diabetes mellitus without mention of complication    Gout synovitis    Pulmonary edema    Type 2 diabetes mellitus without complications    SIRS (systemic inflammatory response syndrome)    Viral cardiomyopathy    Dyspnea    CHF (congestive heart failure)    Cardiac arrest          VITAL SIGNS PHYSICAL EXAM   Temp:  [91.9 F (33.3 C)-96.3 F (35.7 C)] 94.3 F (34.6 C)  Heart Rate:  [45-62] 51  Resp Rate:  [16-22] 16  BP: (76-128)/(46-59) 103/59  FiO2:  [50 %-60 %] 50 %  Blood Glucose:  Pulse ox:  Telemetry:  Vent Settings  Vent Mode: PRVC  FiO2: 50 %  Resp Rate (Set): 16  Vt (Set, mL): 440 mL  PIP Observed (cm H2O): 21 cm H2O  PEEP/EPAP: 6 cm H20  Mean Airway Pressure: 9 cmH20    Intake/Output Summary (Last 24 hours) at 08/18/2018 0757  Last data filed at 08/18/2018 0700  Gross per 24 hour   Intake 1430.7 ml   Output 1025 ml   Net 405.7 ml    Physical Exam  Patient intubated and sedated with no overt LE edema  Regular rate and rhythm on telemetry  Hemodynamically stable  Not in any acute distress  Per nursing, even off propofol, patient is not awakening or following commands        Scheduled Meds: PRN Meds:    atorvastatin, 10 mg, Oral,  Daily  famotidine, 10 mg, Oral, Daily    Or  famotidine, 10 mg, Intravenous, Daily  heparin (porcine), 5,000 Units, Subcutaneous, Q12H SCH  insulin lispro, 1-5 Units, Subcutaneous, Q4H SCH  senna-docusate, 1 tablet, Oral, QHS        Continuous Infusions:   fentaNYL 75 mcg/hr (08/18/18 0600)    magnesium sulfate      norepinephrine (LEVOPHED) infusion 5.067 mcg/min (08/18/18 0700)    propofol 50 mcg/kg/min (08/18/18 0700)    dextrose, 15 g of glucose, PRN    And  dextrose, 12.5 g, PRN    And  glucagon (rDNA), 1 mg, PRN  fentaNYL (PF), 25 mcg, PRN            Labs (last 72 hours):  Recent Labs     08/18/18  0116 08/17/18  0621 08/16/18  2339   WBC 13.89* 16.93* 9.71*   Hgb 10.6* 12.3 12.2   Hematocrit 31.7* 38.1 37.2     Recent Labs     08/18/18  0116 08/17/18  1806 08/17/18  1150   PT 13.8 13.7 14.1   PT INR 1.1 1.1 1.1   PTT 29 29  26    Recent Labs     08/18/18  0116 08/17/18  1806 08/17/18  1150   Sodium 138 137 136   Potassium 4.4 4.7 4.7   Chloride 109 108 106   CO2 19* 18* 18*   BUN 25.0* 28.0* 27.0*   Creatinine 1.3* 1.5* 1.5*   Glucose 227* 197* 171*   Calcium 8.3 8.3 8.2   Magnesium 1.9 1.8 1.9   Phosphorus 4.3 4.8* 4.2               - ABG in process    Microbiology:   Bcx- NGTD x2  MRSA- negative    COVID-negative     Imaging:    XR Chest  HISTORY: Central line adjustment    COMPARISON:Earlier the same day    FINDINGS:   The right IJ central venous catheter is now at the caval chilled  junction. Endotracheal tube, esophageal temperature probe and enteric  tubes are unchanged in position. Unchanged retrocardiac opacity. No  pleural effusion or pneumothorax.    IMPRESSION:      Right IJ central venous catheter has been pulled back with tip now at  the cavoatrial junction.    CT Head- no acute intracranial abnormality      Assessment and Plan:  [name] is a 73 y.o. 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 Etiology is likely her cardiomyopathy.    NEURO  #Pain/Sedation  - resume Propofol and PRN Fentanyl for sedation    #Preventing Anoxic Brain Injury After Cardiac Arrest  - TTM protocol initiated  - CT head without any acute intracranial abnormality  - Patient will be switched to warming protocol at 8 am    Cardio:    #VFib Arrest  ROSC achieved after 1mg  of Epi and 3 shocks (~10 minutes)   Likely in the setting of viral cardiomyopathy. Low suspicion for PE. EKG without evidence of acute MI.   - initiated TTM protocol 34-36 degrees  - Patient will be switched to warming protocol at 8 am  - continuous telemetry monitoring  - EP to see patient on 6/8 for ICD placement consulation    #HFrEF  EF40% in 2017  - F/u new ECHO  - hold GDMT given risk of hemodynamic collapse     Resp:    #Acute Hypoxic Respiratory Failure in the setting of Cardiac Arrest  - resume mechanical ventilation for TTM, until post-rewarming phase if patient recovers neurogical function  - will obtain ABG for vent setting adjustment    Renal /Fluid, Electrolytes :    #AKI on CKD - in the setting of renal hypoperfusion during cardiac arrest  BL Cr 1.7?   - monitor renal function  - avoid nephrotoxic meds  - renally dose meds  - strictly monitor urine output    GI:    #Transamnitis  - likely 2/2 shock liver  - monitor LFTs daily  - hold Tylenol    Nutrition: Initiate tube feeds    Infectious Disease (ID):    COVID r/o (rapid)    F/u BCx, MRSA swab    S/p Ceftriaxone and Azithromycin in the ED, will hold off on redosing due to low suspicion for infection           Hem/Onc:    #Mild leukocytosis  Likely reactive     Endo:    #T2DM  -  resume Q4H accuchecks with LD SSI; can switch to regular insulin once tube feeds are initiated  - Used 9 units of lispro over 24 hours. Can initiate glargine 5 units qdaily     Prophylaxis:  - VTE:  SubQ Heparin  - PPI:  Pepcid  - BM: pericolace    Code Status: Full    Lines/Drains/Airways:    Patient  Lines/Drains/Airways Status    Active Lines, Drains and Airways     Name:   Placement date:   Placement time:   Site:   Days:    CVC Triple Lumen 08/17/18 Right Internal jugular   08/17/18    1800    Internal jugular   less than 1    Peripheral IV 08/16/18 Right Antecubital   08/16/18    2341    Antecubital   1    Peripheral IV 08/16/18 Right Forearm   08/16/18    2351    Forearm   1    Peripheral IV 08/17/18 Left Forearm   08/17/18    --    Forearm   1    Urethral Catheter Non-latex 16 Fr.   08/17/18    0003    Non-latex   1    ETT  7.5 mm   08/16/18    2342     1    Peripheral Arterial Line 08/17/18 Left Radial   08/17/18    1800    Radial   less than 1    NG/OG Tube Orogastric Right mouth   08/16/18    2353    Right mouth   1         Inactive Lines, Drains and Airways     Name:   Placement date:   Placement time:   Removal date:   Removal time:   Site:   Days:    [REMOVED] Intraosseous Line 08/17/18 Tibia   08/17/18    --    08/17/18    0630    Anterior;Right   less than 1                    Safety Checklist:     DVT prophylaxis:  CHEST guideline (See page [id number]) Chemical   Foley: Present and continue: Indication: Strict I/O   IVs:  Peripheral IV and Central Line   PT/OT: Not needed   Daily CBC & or Chem ordered:  SHM/ABIM guidelines (see #5) Yes, due to clinical and lab instability   Reference for charges of common labs: CBC auto diff - $76   BMP - $99   Mg - $79        Signed by: Rhea Belton, MD  Date/Time: 08/18/18 7:57 AM

## 2018-08-19 ENCOUNTER — Inpatient Hospital Stay: Payer: Medicare Other

## 2018-08-19 ENCOUNTER — Inpatient Hospital Stay (HOSPITAL_COMMUNITY): Payer: Medicare Other

## 2018-08-19 DIAGNOSIS — J9600 Acute respiratory failure, unspecified whether with hypoxia or hypercapnia: Secondary | ICD-10-CM

## 2018-08-19 DIAGNOSIS — I340 Nonrheumatic mitral (valve) insufficiency: Secondary | ICD-10-CM

## 2018-08-19 DIAGNOSIS — D72829 Elevated white blood cell count, unspecified: Secondary | ICD-10-CM

## 2018-08-19 DIAGNOSIS — Z79899 Other long term (current) drug therapy: Secondary | ICD-10-CM

## 2018-08-19 DIAGNOSIS — I509 Heart failure, unspecified: Secondary | ICD-10-CM

## 2018-08-19 DIAGNOSIS — I428 Other cardiomyopathies: Secondary | ICD-10-CM

## 2018-08-19 DIAGNOSIS — I517 Cardiomegaly: Secondary | ICD-10-CM

## 2018-08-19 DIAGNOSIS — B3324 Viral cardiomyopathy: Secondary | ICD-10-CM

## 2018-08-19 DIAGNOSIS — I5042 Chronic combined systolic (congestive) and diastolic (congestive) heart failure: Secondary | ICD-10-CM

## 2018-08-19 DIAGNOSIS — I4901 Ventricular fibrillation: Principal | ICD-10-CM

## 2018-08-19 DIAGNOSIS — I360 Nonrheumatic tricuspid (valve) stenosis: Secondary | ICD-10-CM

## 2018-08-19 LAB — CBC AND DIFFERENTIAL
Absolute NRBC: 0 10*3/uL (ref 0.00–0.00)
Basophils Absolute Automated: 0.06 10*3/uL (ref 0.00–0.08)
Basophils Automated: 0.3 %
Eosinophils Absolute Automated: 0.45 10*3/uL — ABNORMAL HIGH (ref 0.00–0.44)
Eosinophils Automated: 2.5 %
Hematocrit: 33 % — ABNORMAL LOW (ref 34.7–43.7)
Hgb: 11.1 g/dL — ABNORMAL LOW (ref 11.4–14.8)
Immature Granulocytes Absolute: 0.11 10*3/uL — ABNORMAL HIGH (ref 0.00–0.07)
Immature Granulocytes: 0.6 %
Lymphocytes Absolute Automated: 1.35 10*3/uL (ref 0.42–3.22)
Lymphocytes Automated: 7.6 %
MCH: 30.8 pg (ref 25.1–33.5)
MCHC: 33.6 g/dL (ref 31.5–35.8)
MCV: 91.7 fL (ref 78.0–96.0)
MPV: 10.4 fL (ref 8.9–12.5)
Monocytes Absolute Automated: 1.44 10*3/uL — ABNORMAL HIGH (ref 0.21–0.85)
Monocytes: 8.1 %
Neutrophils Absolute: 14.39 10*3/uL — ABNORMAL HIGH (ref 1.10–6.33)
Neutrophils: 80.9 %
Nucleated RBC: 0 /100 WBC (ref 0.0–0.0)
Platelets: 204 10*3/uL (ref 142–346)
RBC: 3.6 10*6/uL — ABNORMAL LOW (ref 3.90–5.10)
RDW: 13 % (ref 11–15)
WBC: 17.8 10*3/uL — ABNORMAL HIGH (ref 3.10–9.50)

## 2018-08-19 LAB — URINALYSIS REFLEX TO MICROSCOPIC EXAM - REFLEX TO CULTURE
Bilirubin, UA: NEGATIVE
Blood, UA: NEGATIVE
Glucose, UA: 150 — AB
Ketones UA: 5 — AB
Nitrite, UA: NEGATIVE
Protein, UR: 30 — AB
Specific Gravity UA: 1.029 (ref 1.001–1.035)
Urine pH: 5 (ref 5.0–8.0)
Urobilinogen, UA: NORMAL mg/dL (ref 0.2–2.0)

## 2018-08-19 LAB — BASIC METABOLIC PANEL
BUN: 22 mg/dL — ABNORMAL HIGH (ref 7.0–19.0)
CO2: 18 mEq/L — ABNORMAL LOW (ref 22–29)
Calcium: 8.5 mg/dL (ref 7.9–10.2)
Chloride: 109 mEq/L (ref 100–111)
Creatinine: 1.4 mg/dL — ABNORMAL HIGH (ref 0.6–1.0)
Glucose: 266 mg/dL — ABNORMAL HIGH (ref 70–100)
Potassium: 4.7 mEq/L (ref 3.5–5.1)
Sodium: 136 mEq/L (ref 136–145)

## 2018-08-19 LAB — HEPATIC FUNCTION PANEL
ALT: 72 U/L — ABNORMAL HIGH (ref 0–55)
AST (SGOT): 42 U/L — ABNORMAL HIGH (ref 5–34)
Albumin/Globulin Ratio: 1.1 (ref 0.9–2.2)
Albumin: 3.1 g/dL — ABNORMAL LOW (ref 3.5–5.0)
Alkaline Phosphatase: 85 U/L (ref 37–106)
Bilirubin Direct: 0.2 mg/dL (ref 0.0–0.5)
Bilirubin Indirect: 0.4 mg/dL (ref 0.2–1.0)
Bilirubin, Total: 0.6 mg/dL (ref 0.2–1.2)
Globulin: 2.8 g/dL (ref 2.0–3.6)
Protein, Total: 5.9 g/dL — ABNORMAL LOW (ref 6.0–8.3)

## 2018-08-19 LAB — CALCIUM, IONIZED: Calcium, Ionized: 2.54 mEq/L (ref 2.30–2.58)

## 2018-08-19 LAB — CBC PATHOLOGIST REVIEW

## 2018-08-19 LAB — GFR: EGFR: 36.8

## 2018-08-19 LAB — GLUCOSE WHOLE BLOOD - POCT
Whole Blood Glucose POCT: 283 mg/dL — ABNORMAL HIGH (ref 70–100)
Whole Blood Glucose POCT: 265 mg/dL — ABNORMAL HIGH (ref 70–100)
Whole Blood Glucose POCT: 216 mg/dL — ABNORMAL HIGH (ref 70–100)
Whole Blood Glucose POCT: 222 mg/dL — ABNORMAL HIGH (ref 70–100)
Whole Blood Glucose POCT: 172 mg/dL — ABNORMAL HIGH (ref 70–100)

## 2018-08-19 LAB — PHOSPHORUS: Phosphorus: 3.9 mg/dL (ref 2.3–4.7)

## 2018-08-19 MED ORDER — DEXTROSE 50 % IV SOLN
12.50 g | INTRAVENOUS | Status: DC | PRN
Start: 2018-08-19 — End: 2018-08-27

## 2018-08-19 MED ORDER — VANCOMYCIN HCL IN NACL 1.5-0.9 GM/500ML-% IV SOLN
1500.00 mg | Freq: Once | INTRAVENOUS | Status: AC
Start: 2018-08-19 — End: 2018-08-19
  Administered 2018-08-19: 16:00:00 1500 mg via INTRAVENOUS
  Filled 2018-08-19: qty 1500

## 2018-08-19 MED ORDER — INSULIN GLARGINE 100 UNIT/ML SC SOLN
10.00 [IU] | Freq: Every morning | SUBCUTANEOUS | Status: DC
Start: 2018-08-20 — End: 2018-08-20
  Administered 2018-08-20: 07:00:00 10 [IU] via SUBCUTANEOUS
  Filled 2018-08-19: qty 10

## 2018-08-19 MED ORDER — FENTANYL CITRATE (PF) 50 MCG/ML IJ SOLN (WRAP)
25.00 ug | INTRAMUSCULAR | Status: DC | PRN
Start: 2018-08-19 — End: 2018-08-20
  Administered 2018-08-19 – 2018-08-20 (×3): 25 ug via INTRAVENOUS
  Filled 2018-08-19 (×3): qty 2

## 2018-08-19 MED ORDER — GLUCOSE 40 % PO GEL
15.00 g | ORAL | Status: DC | PRN
Start: 2018-08-19 — End: 2018-08-27

## 2018-08-19 MED ORDER — INSULIN REGULAR HUMAN 100 UNIT/ML IJ SOLN
1.00 [IU] | Freq: Four times a day (QID) | INTRAMUSCULAR | Status: DC
Start: 2018-08-19 — End: 2018-08-24
  Administered 2018-08-19 (×2): 3 [IU] via SUBCUTANEOUS
  Administered 2018-08-20 (×3): 5 [IU] via SUBCUTANEOUS
  Administered 2018-08-21: 18:00:00 3 [IU] via SUBCUTANEOUS
  Administered 2018-08-22: 1 [IU] via SUBCUTANEOUS
  Administered 2018-08-22 – 2018-08-24 (×6): 3 [IU] via SUBCUTANEOUS
  Filled 2018-08-19 (×6): qty 9
  Filled 2018-08-19: qty 15
  Filled 2018-08-19: qty 9
  Filled 2018-08-19: qty 15
  Filled 2018-08-19: qty 9
  Filled 2018-08-19: qty 3
  Filled 2018-08-19: qty 9

## 2018-08-19 MED ORDER — GLUCAGON 1 MG IJ SOLR (WRAP)
1.00 mg | INTRAMUSCULAR | Status: DC | PRN
Start: 2018-08-19 — End: 2018-08-27

## 2018-08-19 MED ORDER — INSULIN REGULAR HUMAN 100 UNIT/ML IJ SOLN
6.00 [IU] | Freq: Four times a day (QID) | INTRAMUSCULAR | Status: DC
Start: 2018-08-19 — End: 2018-08-21
  Administered 2018-08-19 – 2018-08-20 (×4): 6 [IU] via SUBCUTANEOUS
  Filled 2018-08-19 (×2): qty 18

## 2018-08-19 MED ORDER — VANCOMYCIN HCL IN NACL 1.75-0.9 GM/500ML-% IV SOLN
1750.00 mg | Freq: Once | INTRAVENOUS | Status: DC
Start: 2018-08-19 — End: 2018-08-19

## 2018-08-19 MED ORDER — INSULIN REGULAR HUMAN 100 UNIT/ML IJ SOLN
4.00 [IU] | Freq: Four times a day (QID) | INTRAMUSCULAR | Status: DC
Start: 2018-08-19 — End: 2018-08-19
  Administered 2018-08-19 (×2): 4 [IU] via SUBCUTANEOUS
  Filled 2018-08-19: qty 12

## 2018-08-19 MED ORDER — DEXMEDETOMIDINE HCL IN NACL 400 MCG/100ML IV SOLN
0.00 ug/kg/h | INTRAVENOUS | Status: DC
Start: 2018-08-19 — End: 2018-08-20
  Administered 2018-08-19: 18:00:00 0.5 ug/kg/h via INTRAVENOUS
  Administered 2018-08-19: 1 ug/kg/h via INTRAVENOUS
  Administered 2018-08-20: 04:00:00 1.5 ug/kg/h via INTRAVENOUS
  Administered 2018-08-20: 12:00:00 1.2 ug/kg/h via INTRAVENOUS
  Administered 2018-08-20: 07:00:00 1.5 ug/kg/h via INTRAVENOUS
  Filled 2018-08-19 (×5): qty 100

## 2018-08-19 MED ORDER — PERFLUTREN PROTEIN A MICROSPH IV SUSP
3.00 mL | Freq: Once | INTRAVENOUS | Status: AC
Start: 2018-08-19 — End: 2018-08-19
  Administered 2018-08-19: 12:00:00 3 mL via INTRAVENOUS
  Filled 2018-08-19: qty 3

## 2018-08-19 MED ORDER — PIPERACILLIN SOD-TAZOBACTAM SO 4.5 (4-0.5) G IV SOLR
4.5000 g | Freq: Three times a day (TID) | INTRAVENOUS | Status: AC
Start: 2018-08-19 — End: 2018-08-21
  Administered 2018-08-19 – 2018-08-21 (×8): 4.5 g via INTRAVENOUS
  Filled 2018-08-19 (×8): qty 20

## 2018-08-19 NOTE — Consults (Signed)
Chaplain Service      Background:  Visit Type: Initial was made by Chaplain with patient, Tara Perry, based on Source: Staff Request.  Present at Visit: patient.  Spiritual Care Provided to: patient only.  Length of Visit: 0-15 minutes .    Summary:  Reason for Request: Crisis care   Spiritual Care Interventions: Provided prayer      Chaplain provided silent pastoral presence at this time and read some uplifting universal-language Psalms at bedside.   Chaplain Louisa Second  Pastoral Care Dept.  [phone]  Emergency Pager#: [phone]

## 2018-08-19 NOTE — Progress Notes (Signed)
Pt on a ventilator.  Spoke with Tara Perry- daughter [phone] to complete assessment.  Pt lives alone, independent with self care, not using DMEs at home.  Pt has no advance directives, widower and has three children.  Pt was never been in any rehab facility in the past.  Pt has no issues getting prescriptions.  Pt needs PT OT eval upon discharge.     08/19/18 1353   Patient Type   Within 30 Days of Previous Admission? No   Healthcare Decisions   Interviewed: Family   Name of interviewee if other than the pt: Tara Perry- daughter [phone]   Orientation/Decision Making Abilities of Patient Patient on ventilator   Advance Directive Patient does not have advance directive   (RETIRED) Healthcare Agent's Name Tara Perry- daughter [phone]   Prior to admission   Prior level of function Independent with ADLs;Ambulates independently   Type of Residence Private residence   Home Layout Two level   Have running water, electricity, heat, etc? Yes   Living Arrangements Alone   How do you get to your MD appointments? drives self    How do you get your groceries? self   Who fixes your meals? self   Who does your laundry? self   Who picks up your prescriptions? self   Dressing Independent   Grooming Independent   Feeding Independent   Bathing Independent   Toileting Independent   Adult Protective Services (APS) involved? No   Discharge Planning   Support Systems Family members;Children   Patient expects to be discharged to: tbd   Anticipated [place] plan discussed with: Same as interviewed   Mode of transportation: Private car (family member)   Does the patient have perscription coverage? Yes   Consults/Providers   PT Evaluation Needed 1   OT Evalulation Needed 1   SLP Evaluation Needed 1   Correct PCP listed in Epic? Yes   PCP   PCP on file was verified as the current PCP? Yes   Important Message from [facility] Notice   Patient received 1st IMM Letter? Yes   Date of most recent IMM given: 08/16/18       Tara Pellant RN  BSN  Case Manager   [facility]   [facility]   [address]   [place], [place] [address]   T [phone]

## 2018-08-19 NOTE — Plan of Care (Signed)
Updated patient's daughter on patient's status and answered all questions. She stated she would like to [facility] with the patient and will be in touch with nursing staff about that.    Signed:  Alfred Levins, MD  CICU Resident

## 2018-08-19 NOTE — Consults (Signed)
NUTRITION CONSULT  Reason for Assessment: tube feeding    Assessment: Tara Perry is a(n) 73 y.o. female with gout, viral myocarditis, secondary CHF, T2DM, who presented after VF cardiac arrest, requiring intubation/sedation, TTM protocol.  Rewarmed as of MN.  Noted renal lytes are trending up.    Abdomen/GI Fxn: Last BM PTA, with soft abd and hypoactive BS.  Tolerating Nepro at 30 ml/h via OGT.  Noted shellfish allergy.    Current Diet Order:  Orders Placed This Encounter   Procedures   . Diet NPO effective now   . Tube feeding-Continuous     Anthropometrics:  Height: 162.6 cm (5' 4.02")  Weight: 67.1 kg (147 lb 14.9 oz)  Body mass index is 25.38 kg/m.  IBW: 54.6 kg    Biochemical Data/Medications:  Pertinent Labs: glu 266, BUN 22, Creat 1.4, CO2 18, AST 42, ALT 72  Pertinent Meds: lipitor, pepcid, heparin, lantus, regular insulin, zosyn, pericolace, vancomycin, fentanyl, norepinephrine, propofol @ 18.7 ml/h (494 kcals)       Estimated Nutrition Needs:   1360-1660 kcals (20-25 kcal/kg)  81-134 gm pro (1.2-2 gm/kg)  Fluids per team    Nutrition Diagnosis:   Intake Inadequate Protein Intake related to amount of protein provided by TF formula as evidenced by pt only meeting 65% of protein needs on Nepro at 30 ml/h.    Intervention:   Given renal lytes are on the rise, can continue with Nepro, but recommend reducing rate to 25 ml/h and adding 4 pkts Prosource while on propofol (1150 kcals, 89 gm pro, 534 ml free water).   Off propofol, recommend Nepro at 40 ml/h + 1 pkts Prosource (1624 kcals, 82 gm pro, 673 ml free water).      Goals: To meet 100% of nutrition support goal within the next 24-48 hrs.     Monitor/Eval:  Nutr support goals, GI, labs, meds, med tx plan    Marveen Donlon M. Francisco Capuchin, RD  Available via [employer], Spectra: [phone]  Main RD office: [phone]

## 2018-08-19 NOTE — Progress Notes (Signed)
Patient receiving tube feeding (Nepro) at 81ml/hr via OG. Bowel sounds hypoactive. Residual checked was 450 ml. MD made aware and told this RN to decrease rate of TF to 18ml/hr

## 2018-08-19 NOTE — Progress Notes (Signed)
ICU/CCU Daily Progress Note    Patient's Name: MUNA DEMERS    Room:  FI111/FI111-01  Attending Provider: Sharyn Lull, MD  Admit Date:08/16/2018  Medical Record Number: [id number]     Date/Time: 08/19/18 7:35 AM    73 year old lady with history of diabetes and post viral cardiomyopathy, who underwent a witnessed cardiac arrest at home secondary to ventricular dysrhythmia and achieved ROSC post CPR and defibrillationX3. 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  8am on 6/6: reached goal temperature on hypothermia protocol  8am on 6/7: rewarming reinitiated      24hr events:  S/p 24 hours of rewarming after TTM    Patient Active Problem List   Diagnosis    Peritonitis    Viral cardiomyopathy    Type II diabetes mellitus without mention of complication    Gout synovitis    Pulmonary edema    Type 2 diabetes mellitus without complications    SIRS (systemic inflammatory response syndrome)    Viral cardiomyopathy    Dyspnea    CHF (congestive heart failure)    Cardiac arrest          VITAL SIGNS PHYSICAL EXAM   Temp:  [91 F (32.8 C)-100.4 F (38 C)] 100.4 F (38 C)  Heart Rate:  [47-100] 98  Resp Rate:  [12-24] 14  FiO2:  [24 %-50 %] 30 %  Vent Settings  Vent Mode: PRVC  FiO2: 30 %  Resp Rate (Set): 16  Vt (Set, mL): 440 mL  PIP Observed (cm H2O): 22 cm H2O  PEEP/EPAP: 6 cm H20  Mean Airway Pressure: 9 cmH20    Intake/Output Summary (Last 24 hours) at 08/19/2018 0702  Last data filed at 08/19/2018 0600  Gross per 24 hour   Intake 1231.81 ml   Output 925 ml   Net 306.81 ml    Physical Exam  Gen: Patient intubated and sedated   CV: Regular rate and rhythm without m/r/g  Pulm: CTAB anteriorly, on ventilator  Abdomen: soft, non-distended  LE: no edema or cyanosis  Neuro: per nursing, weak gag reflex; pupils are round, equal, constricted, but reactive to light        Scheduled  Meds: PRN Meds:    atorvastatin, 10 mg, Oral, Daily  famotidine, 10 mg, Oral, Daily    Or  famotidine, 10 mg, Intravenous, Daily  heparin (porcine), 5,000 Units, Subcutaneous, Q12H SCH  insulin glargine, 5 Units, Subcutaneous, QAM  insulin lispro, 1-5 Units, Subcutaneous, Q4H SCH  senna-docusate, 1 tablet, Oral, QHS        Continuous Infusions:   fentaNYL 150 mcg/hr (08/19/18 0600)    magnesium sulfate      norepinephrine (LEVOPHED) infusion 7.067 mcg/min (08/19/18 0600)    propofol 50 mcg/kg/min (08/19/18 0600)    acetaminophen, 325 mg, Q6H PRN  dextrose, 15 g of glucose, PRN    And  dextrose, 12.5 g, PRN    And  glucagon (rDNA), 1 mg, PRN  fentaNYL (PF), 25 mcg, PRN            Labs (last 72 hours):  Recent Labs     08/19/18  0522 08/18/18  0116 08/17/18  0621   WBC 17.80* 13.89* 16.93*   Hgb 11.1* 10.6* 12.3   Hematocrit 33.0* 31.7* 38.1     Recent Labs     08/18/18  2257 08/18/18  1533 08/18/18  1610 08/18/18  0116   PT  --  13.8 14.1 13.8   PT INR  --  1.1 1.1 1.1   PTT 34 28 30 29     Recent Labs     08/19/18  0522 08/18/18  2257 08/18/18  1529 08/18/18  0824 08/18/18  0116 08/17/18  1806   Sodium 136 138 136 138 138 137   Potassium 4.7 4.0 3.9 4.1 4.4 4.7   Chloride 109 110 108 109 109 108   CO2 18* 20* 18* 19* 19* 18*   BUN 22.0* 20.0* 21.0* 23.0* 25.0* 28.0*   Creatinine 1.4* 1.2* 1.2* 1.2* 1.3* 1.5*   Glucose 266* 237* 251* 186* 227* 197*   Calcium 8.5 8.0 8.2 8.2 8.3 8.3   Magnesium  --   --   --  2.9* 1.9 1.8   Phosphorus 3.9 3.2 3.2 3.5 4.3 4.8*                 Microbiology:   Bcx- NGTD x2  MRSA- negative    COVID-negative     Imaging:    CT Head- no acute intracranial abnormality      Assessment and Plan:  EMMYLOU BIEKER is a 73 y.o. 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 Etiology is likely cardiomyopathy with rEF and LBBB. Awaiting weaning of sedation to assess neurological function.    NEURO  #Pain/Sedation  - patient  is now s/p rewarming phase, will wean off Propofol first and then fentanyl to assess neurological status    #Preventing Anoxic Brain Injury After Cardiac Arrest  - s/p TTM, now post-rewarmining, will wean off sedation to assess neurological status  - CT head without any acute intracranial abnormality  - s/p 24 hours of rewarming (started at 8am on 6/7)    Cardio:    #VFib Arrest  ROSC achieved after 1mg  of Epi and 3 shocks (~10 minutes)   Likely in the setting of viral cardiomyopathy, EF 40% with LBBB. Low suspicion for PE. EKG without evidence of acute MI.   - s/p TTM protocol  - continuous telemetry monitoring  - EP consulted, appreciate recs. If patient has neurological recovery, will plan for CRT-D placement  - Patient will need ischemic workup with cath as well    #HFrEF  EF40% in 2017  - F/u new ECHO  - hold GDMT given vasopressor requirement    #Shock / Vasopressor Requirement  Cannot r/o cardiogenic, vs vasopressor requirement in the setting of sedatives  - resume Levophed, wean for MAP goal of >65    Resp:    #Acute Hypoxic Respiratory Failure in the setting of Cardiac Arrest  - resume mechanical ventilation for TTM, until post-rewarming phase if patient recovers neurogical function    Renal /Fluid, Electrolytes :    #AKI on CKD - in the setting of renal hypoperfusion during cardiac arrest  BL Cr 1.7? Therefore may be resolved as Cr now is 1.4 - at baseline  - monitor renal function  - avoid nephrotoxic meds  - renally dose meds  - strictly monitor urine output    GI:    #Transamnitis - resolving  - likely 2/2 shock liver  - monitor LFTs daily  - hold Tylenol    Nutrition: Resume tube feeds    Infectious Disease (ID):    #Leukocytosis   Increasing, not febrile due to cooling pads  BCx  negative to date, MRSA negative.  - resend BCx, UA, RCx, MRSA swab  - start Vancomycin and Zosyn    S/p Ceftriaxone and Azithromycin in the ED, will hold off on redosing due to low suspicion for infection            Hem/Onc:    #Rising leukocytosis  Reactive vs pneumonia  - infectious workup as above  - monitor with daily CBC    Endo:    #T2DM  - patient is on continuous tube feeds, therefore initiated Insulin Regular 4u Q6H with MD SSI  In addition to Lantus 5 daily    Prophylaxis:  - VTE:  SubQ Heparin  - PPI:  Pepcid  - BM: pericolace    Code Status: Full    Lines/Drains/Airways:    Patient Lines/Drains/Airways Status    Active Lines, Drains and Airways     Name:   Placement date:   Placement time:   Site:   Days:    CVC Triple Lumen 08/17/18 Right Internal jugular   08/17/18    1800    Internal jugular   1    Peripheral IV 08/16/18 Right Antecubital   08/16/18    2341    Antecubital   2    Peripheral IV 08/16/18 Right Forearm   08/16/18    2351    Forearm   2    Peripheral IV 08/17/18 Left Forearm   08/17/18    --    Forearm   2    Urethral Catheter Non-latex 16 Fr.   08/17/18    0003    Non-latex   2    ETT  7.5 mm   08/16/18    2342     2    Peripheral Arterial Line 08/17/18 Left Radial   08/17/18    1800    Radial   1    NG/OG Tube Orogastric Right mouth   08/16/18    2353    Right mouth   2         Inactive Lines, Drains and Airways     Name:   Placement date:   Placement time:   Removal date:   Removal time:   Site:   Days:    [REMOVED] Intraosseous Line 08/17/18 Tibia   08/17/18    --    08/17/18    0630    Anterior;Right   less than 1                  Signed by: Alfred Levins, MD  Date/Time: 08/19/18 7:02 AM

## 2018-08-19 NOTE — Plan of Care (Signed)
Received patient intubated and sedated on propofol and fentanyl gtts. Sedation weaned and stopped per order. At 1700, patient became restless w/ frequent coughing, not tolerating ventilator. Not following commands or tracking. PRN fentanyl push given with little effect on agitation. CICU resident called; precedex gtt initiated per order.  Patient temperature frequently fluctuates with temp higher than set 37 degrees; CICU aware.  Blood and urine cultures sent per order.  400cc tube feeding residual at 1200/1600; CICU aware, no further orders.  No falls or injuries this shift. Hourly rounding, q2h turns and oral care provided to ensure pt comfort and skin integrity.  Continue fall and safety precautions.

## 2018-08-19 NOTE — Progress Notes (Signed)
CCU Cardiology Consult Daily Note  Patient's Name: Tara Perry   Attending Provider: Sharyn Lull, MD  Admit Date:08/16/2018  Medical Record Number: [id number]   Room:  FI111/FI111-01  Date/Time: 08/19/18 2:41 PM   Cardiology: [facility]    Impression/Recommendations-  Tara Perry is a 73 year old female with a history of heart failure with reduced ejection fraction that had recovered on last check that presented with ventricular fibrillation.  She had a witnessed cardiac arrest with immediate CPR.  ROSC was obtained after 3 cardioversions and a single dose of epinephrine.  She was started on hypothermic protocol with goal temperature reached to 815 on August 17, 2018.       It is likely that her cardiac arrest was due to the previously diagnosed ejection fraction and risk for cardiac arrest.  Prior to 2017 she had a reduced ejection fraction in the 25% range in 2017 was 40%.  It is possible that the ejection fraction subsequently reduced despite goal-directed medical therapy.  She has a left bundle branch block and will benefit from a CRT-D.  Electrophysiology has been consulted.    #1. VF arrest  - completed TTM and rewarming  - repeat echo for EF assessment  - holding anti-arrhythmics per EP  - will need a CRT-D device once she demonstrates adequate neurologic recovery  - may require a repeat ischemic evaluation.    #2. Non-ischemic cardiomyopathy  - EF assessment today  - remains on low dose NE, cannot initiate GDMT  - not significantly overloaded on exam , PRN diuresis    #3. Leukocytosis  - borderline temperature just post rewarming phase  - pan- culture  - holding off on Abx at present    #4. Neurological status  - await improvement, began to interact slightly with bedside RN staff  - Holding sedatives    I have personally reviewed the patients history and 24 hour interval events, along with vitals, labs, radiology images.  Rounds completed with the CICU team.  Treatment of an organ system (cardiac) failure  with high probability of life threatening deterioration and life saving interventions.  Critical Care time excluding procedures/teaching = 30 minutes.    Fuller Song, MD [facility]  CICU Consult Attending  ---------------------------------------------------------    73 year old female with history of diabetes and post viral cardiomyopathy, who suffered a witnessed cardiac arrest at home secondary to ventricular dysrhythmia and achieved ROSC post CPR and defibrillationX3. 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    Review of systems:  No constitutional complaints, no respiratory complaints, no musculoskeletal complaints, or other pertinent review of symptoms except as noted above.    Current medications:  Current Facility-Administered Medications   Medication Dose Route Frequency    atorvastatin  10 mg Oral Daily    famotidine  10 mg Oral Daily    heparin (porcine)  5,000 Units Subcutaneous Q12H SCH    insulin glargine  5 Units Subcutaneous QAM    insulin regular  1-8 Units Subcutaneous 4 times per day    insulin regular  4 Units Subcutaneous 4 times per day    piperacillin-tazobactam  4.5 g Intravenous Q8H    senna-docusate  1 tablet Oral QHS    vancomycin  1,500 mg Intravenous Once     Allergies:  Allergies   Allergen Reactions    Shellfish-Derived Products Anaphylaxis     Weight:  Wt Readings from Last 4 Encounters:   08/19/18 67.1 kg (147 lb 14.9 oz)   12/29/16 65.8 kg (145 lb)   09/13/12 62.6 kg (138 lb)   02/08/11 61 kg (134 lb 6.4 oz)     Physical Exam:  Vitals:    08/19/18 1214   BP:    Pulse:    Resp:    Temp:    SpO2: 100%      General: Intubated, hypothermia protocol  Eyes: No xanthelasma.  Oropharynx: No mucosal pallor.  Neck: Trachea midline  Pulmonary: Normal respiratory effort, clear lung sounds bilaterally.  Cardiovascular: PMI nondisplaced with normal rate  and rhythm, normal S1, normal S2, no murmur, rub, or gallop appreciated.  Abdomen: Soft  Extremities: No edema, no clubbing, no cyanosis.  Skin: No tendinous xanthomas  Psych:  Intubated    ECG:   Previous:  NSR, LBBB in 2018  Today:  NSR, LBBB (no Sgarbosa's criteria)    Previous cardiac testing (08/19/18): mildly dilated LV, EF 21%, mild MR and TR.  Compared to previous study dated 12/2015 there has been a decline in EF from 40% to 21%    Laboratory:    Recent Labs     08/19/18  0522 08/18/18  2257 08/18/18  1529 08/18/18  0824 08/18/18  0116 08/17/18  1806   Sodium 136 138 136 138 138 137   Potassium 4.7 4.0 3.9 4.1 4.4 4.7   Chloride 109 110 108 109 109 108   CO2 18* 20* 18* 19* 19* 18*   BUN 22.0* 20.0* 21.0* 23.0* 25.0* 28.0*   Calcium 8.5 8.0 8.2 8.2 8.3 8.3   Calcium, Ionized 2.54 2.49 2.44 2.52 2.41 2.37   Magnesium  --   --   --  2.9* 1.9 1.8   Phosphorus 3.9 3.2 3.2 3.5 4.3 4.8*     Recent Labs     08/19/18  0522 08/18/18  0116 08/17/18  0621   WBC 17.80* 13.89* 16.93*   Hgb 11.1* 10.6* 12.3   Hematocrit 33.0* 31.7* 38.1   Platelets 204 197 218     Recent Labs     08/18/18  2257 08/18/18  1533 08/18/18  0824 08/18/18  0116   PT  --  13.8 14.1 13.8   PT INR  --  1.1 1.1 1.1   PTT 34 28 30 29           Signed by: Dianna Rossetti, MD [facility]  Date/Time: 08/19/18 2:41 PM

## 2018-08-20 LAB — CBC AND DIFFERENTIAL
Absolute NRBC: 0 10*3/uL (ref 0.00–0.00)
Basophils Absolute Automated: 0.04 10*3/uL (ref 0.00–0.08)
Basophils Automated: 0.3 %
Eosinophils Absolute Automated: 0.03 10*3/uL (ref 0.00–0.44)
Eosinophils Automated: 0.2 %
Hematocrit: 27.4 % — ABNORMAL LOW (ref 34.7–43.7)
Hgb: 9.1 g/dL — ABNORMAL LOW (ref 11.4–14.8)
Immature Granulocytes Absolute: 0.06 10*3/uL (ref 0.00–0.07)
Immature Granulocytes: 0.5 %
Lymphocytes Absolute Automated: 0.7 10*3/uL (ref 0.42–3.22)
Lymphocytes Automated: 5.8 %
MCH: 29.8 pg (ref 25.1–33.5)
MCHC: 33.2 g/dL (ref 31.5–35.8)
MCV: 89.8 fL (ref 78.0–96.0)
MPV: 10.4 fL (ref 8.9–12.5)
Monocytes Absolute Automated: 0.72 10*3/uL (ref 0.21–0.85)
Monocytes: 6 %
Neutrophils Absolute: 10.52 10*3/uL — ABNORMAL HIGH (ref 1.10–6.33)
Neutrophils: 87.2 %
Nucleated RBC: 0 /100 WBC (ref 0.0–0.0)
Platelets: 155 10*3/uL (ref 142–346)
RBC: 3.05 10*6/uL — ABNORMAL LOW (ref 3.90–5.10)
RDW: 13 % (ref 11–15)
WBC: 12.07 10*3/uL — ABNORMAL HIGH (ref 3.10–9.50)

## 2018-08-20 LAB — HEPATIC FUNCTION PANEL
ALT: 46 U/L (ref 0–55)
AST (SGOT): 26 U/L (ref 5–34)
Albumin/Globulin Ratio: 1 (ref 0.9–2.2)
Albumin: 2.6 g/dL — ABNORMAL LOW (ref 3.5–5.0)
Alkaline Phosphatase: 77 U/L (ref 37–106)
Bilirubin Direct: 0.2 mg/dL (ref 0.0–0.5)
Bilirubin Indirect: 0.2 mg/dL (ref 0.2–1.0)
Bilirubin, Total: 0.4 mg/dL (ref 0.2–1.2)
Globulin: 2.5 g/dL (ref 2.0–3.6)
Protein, Total: 5.1 g/dL — ABNORMAL LOW (ref 6.0–8.3)

## 2018-08-20 LAB — GLUCOSE WHOLE BLOOD - POCT
Whole Blood Glucose POCT: 184 mg/dL — ABNORMAL HIGH (ref 70–100)
Whole Blood Glucose POCT: 260 mg/dL — ABNORMAL HIGH (ref 70–100)
Whole Blood Glucose POCT: 280 mg/dL — ABNORMAL HIGH (ref 70–100)
Whole Blood Glucose POCT: 298 mg/dL — ABNORMAL HIGH (ref 70–100)
Whole Blood Glucose POCT: 99 mg/dL (ref 70–100)

## 2018-08-20 LAB — GFR: EGFR: 48.6

## 2018-08-20 LAB — RETICULOCYTES
Immature Retic Fract: 9.1 % (ref 1.2–15.6)
Reticulocyte Count Absolute: 0.0496 10*6/uL (ref 0.0220–0.1420)
Reticulocyte Count Automated: 1.7 % (ref 0.8–2.3)
Reticulocyte Hemoglobin: 34 pg (ref 28.4–40.2)

## 2018-08-20 LAB — HAPTOGLOBIN: Haptoglobin: 158 mg/dL (ref 35–250)

## 2018-08-20 LAB — BASIC METABOLIC PANEL
BUN: 24 mg/dL — ABNORMAL HIGH (ref 7.0–19.0)
CO2: 19 mEq/L — ABNORMAL LOW (ref 22–29)
Calcium: 8.2 mg/dL (ref 7.9–10.2)
Chloride: 110 mEq/L (ref 100–111)
Creatinine: 1.1 mg/dL — ABNORMAL HIGH (ref 0.6–1.0)
Glucose: 261 mg/dL — ABNORMAL HIGH (ref 70–100)
Potassium: 3.9 mEq/L (ref 3.5–5.1)
Sodium: 138 mEq/L (ref 136–145)

## 2018-08-20 LAB — VANCOMYCIN, RANDOM: Vancomycin Random: 9 ug/mL

## 2018-08-20 LAB — TROPONIN I: Troponin I: 0.06 ng/mL — ABNORMAL HIGH (ref 0.00–0.05)

## 2018-08-20 LAB — LACTATE DEHYDROGENASE: LDH: 277 U/L (ref 125–331)

## 2018-08-20 MED ORDER — HYDROMORPHONE HCL 0.5 MG/0.5 ML IJ SOLN
0.20 mg | INTRAMUSCULAR | Status: DC | PRN
Start: 2018-08-20 — End: 2018-08-21
  Administered 2018-08-21: 0.2 mg via INTRAVENOUS
  Filled 2018-08-20: qty 1

## 2018-08-20 MED ORDER — HYDROMORPHONE HCL 0.5 MG/0.5 ML IJ SOLN
0.20 mg | INTRAMUSCULAR | Status: DC | PRN
Start: 2018-08-20 — End: 2018-08-20

## 2018-08-20 MED ORDER — LIDOCAINE 5 % EX PTCH
1.00 | MEDICATED_PATCH | CUTANEOUS | Status: DC
Start: 2018-08-20 — End: 2018-08-21
  Administered 2018-08-20: 22:00:00 1 via TRANSDERMAL
  Filled 2018-08-20: qty 1

## 2018-08-20 MED ORDER — OXYCODONE-ACETAMINOPHEN 5-325 MG PO TABS
1.0000 | ORAL_TABLET | ORAL | Status: DC | PRN
Start: 2018-08-20 — End: 2018-08-20

## 2018-08-20 MED ORDER — FENTANYL CITRATE (PF) 50 MCG/ML IJ SOLN (WRAP)
50.00 ug | INTRAMUSCULAR | Status: DC | PRN
Start: 2018-08-20 — End: 2018-08-20

## 2018-08-20 MED ORDER — INSULIN GLARGINE 100 UNIT/ML SC SOLN
14.00 [IU] | Freq: Every morning | SUBCUTANEOUS | Status: DC
Start: 2018-08-21 — End: 2018-08-21

## 2018-08-20 NOTE — Plan of Care (Signed)
Received patient intubated on precedex gtt. Able to follow commands. Precedex gtt weaned per [facility].   Patient tolerated SBT; extubated to [facility] at 1523. Weaned to [facility].   At 1700, patient very agitated and pulled out arterial line. [facility] notified. RN tried to redirect patient, however patient agitated and restless. Mitts applied per order for patient safety. Despite mitts, patient still trying to get OOB and pulling at lines. 1:1 sitter ordered.  No falls or injuries this shift. Hourly rounding completed to ensure pt comfort and skin integrity. Continue to monitor.

## 2018-08-20 NOTE — Progress Notes (Signed)
ICU/CCU Daily Progress Note    Patient's Name: Tara Perry    Room:  FI111/FI111-01  Attending Provider: Sharyn Lull, MD  Admit Date:08/16/2018  Medical Record Number: [id number]     Date/Time: 08/20/18 7:08 AM    73 y/o F with PMH of diabetes and post viral cardiomyopathy who underwent a witnessed cardiac arrest at home secondary to ventricular dysrhythmia and achieved ROSC post CPR and defibrillationX3. 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 Now s/p hypothermia protocol and fully rewarmed.      24hr events:  Weaned off propofol. Continued on fentanyl PRN. Started on Precedex for agitation.  T max 38 at 14:00.  Off pressors.    Patient Active Problem List   Diagnosis    Peritonitis    Viral cardiomyopathy    Type II diabetes mellitus without mention of complication    Gout synovitis    Pulmonary edema    Type 2 diabetes mellitus without complications    SIRS (systemic inflammatory response syndrome)    Viral cardiomyopathy    Dyspnea    CHF (congestive heart failure)    Cardiac arrest       VITAL SIGNS PHYSICAL EXAM   Temp:  [96.3 F (35.7 C)-100.4 F (38 C)] 98.6 F (37 C)  Heart Rate:  [68-130] 69  Resp Rate:  [12-33] 16  FiO2:  [30 %-40 %] 40 %  Vent Settings  Vent Mode: PRVC  FiO2: 40 %  Resp Rate (Set): 12  Vt (Set, mL): 440 mL  PIP Observed (cm H2O): 15 cm H2O  PEEP/EPAP: 6 cm H20  Mean Airway Pressure: 10 cmH20    Intake/Output Summary (Last 24 hours) at 08/20/2018 0708  Last data filed at 08/20/2018 0600  Gross per 24 hour   Intake 1774.27 ml   Output 710 ml   Net 1064.27 ml    Physical Exam  Gen: Patient intubated but alert and awake.   CV: Regular rate and rhythm without m/r/g  Pulm: CTAB anteriorly, on ventilator  Abdomen: soft, non-distended  LE: no edema or cyanosis  Neuro: Per RN, weak gag reflex. Patient is able to track with the eyes, squeeze with both hands and wiggle  toes.        Scheduled Meds: PRN Meds:    atorvastatin, 10 mg, Oral, Daily  famotidine, 10 mg, Oral, Daily  heparin (porcine), 5,000 Units, Subcutaneous, Q12H SCH  insulin glargine, 10 Units, Subcutaneous, QAM  insulin regular, 1-8 Units, Subcutaneous, 4 times per day  insulin regular, 6 Units, Subcutaneous, 4 times per day  piperacillin-tazobactam, 4.5 g, Intravenous, Q8H  senna-docusate, 1 tablet, Oral, QHS        Continuous Infusions:   dexmedetomidine 1.5 mcg/kg/hr (08/20/18 0707)    norepinephrine (LEVOPHED) infusion 0 mcg/min (08/19/18 1700)    acetaminophen, 325 mg, Q6H PRN  dextrose, 15 g of glucose, PRN    And  dextrose, 12.5 g, PRN    And  glucagon (rDNA), 1 mg, PRN  fentaNYL (PF), 25 mcg, PRN  fentaNYL (PF), 50 mcg, Q1H PRN            Labs (last 72 hours):  Recent Labs     08/20/18  0308 08/19/18  0522 08/18/18  0116   WBC 12.07* 17.80* 13.89*   Hgb 9.1* 11.1* 10.6*   Hematocrit 27.4* 33.0* 31.7*     Recent Labs     08/18/18  2257 08/18/18  1533 08/18/18  0824 08/18/18  0116   PT  --  13.8 14.1 13.8   PT INR  --  1.1 1.1 1.1   PTT 34 28 30 29     Recent Labs     08/20/18  0308 08/19/18  0522 08/18/18  2257 08/18/18  1529 08/18/18  0824 08/18/18  0116 08/17/18  1806   Sodium 138 136 138 136 138 138 137   Potassium 3.9 4.7 4.0 3.9 4.1 4.4 4.7   Chloride 110 109 110 108 109 109 108   CO2 19* 18* 20* 18* 19* 19* 18*   BUN 24.0* 22.0* 20.0* 21.0* 23.0* 25.0* 28.0*   Creatinine 1.1* 1.4* 1.2* 1.2* 1.2* 1.3* 1.5*   Glucose 261* 266* 237* 251* 186* 227* 197*   Calcium 8.2 8.5 8.0 8.2 8.2 8.3 8.3   Magnesium  --   --   --   --  2.9* 1.9 1.8   Phosphorus  --  3.9 3.2 3.2 3.5 4.3 4.8*                 Microbiology:   Bcx- NGTD x2  MRSA- negative    COVID-negative     Imaging:  CT Head- no acute intracranial abnormality    Assessment and Plan:  Tara Perry is a 73 y.o. 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 Etiology is likely  cardiomyopathy with rEF and LBBB. Awaiting weaning of sedation to assess neurological function.    NEURO:  #Pain/Sedation  - Patient is now s/p rewarming phase. Now off propofol since 06/08.  - Will wean Precedex today to evaluate full neurological function.    #Preventing Anoxic Brain Injury After Cardiac Arrest  - s/p TTM, now post-rewarmining, will wean off sedation to assess neurological status  - CT head without any acute intracranial abnormality  - s/p 24 hours of rewarming (started at 8am on 6/7).  - Today she is able to follow simple commands. Will wean off Precedex to fully evaluate neurological function.    CARDIO:  #VFib Arrest  ROSC achieved after 1mg  of Epi and 3 shocks (~10 minutes)   Likely in the setting of viral cardiomyopathy, EF 40% with LBBB. Low suspicion for PE. EKG without evidence of acute MI.   - s/p TTM protocol  - Continuous telemetry monitoring  - EP consulted, appreciate recs. If patient has neurological recovery, will plan for CRT-D placement  - Patient will need ischemic workup with cath as well.    #HFrEF  EF40% in 2017  - TTE 06/08 with decreased EF to 21%, LV dilation and multiple wall motion abnormalities.  - Now off pressors. If remains HDS can introduce GDMT.    #Shock / Vasopressor Requirement  Cannot r/o cardiogenic, vs vasopressor requirement in the setting of sedatives  - Required levophed. Now off.     PULMONARY:  #Acute Hypoxic Respiratory Failure in the setting of Cardiac Arrest  - Continue mechanical ventilation. Settings have remained stable over the last 4 days (440/30/16/6.  - Will evaluate neurological function today to determine wether patient could have SBT.    RENAL:  #AKI on CKD - in the setting of renal hypoperfusion during cardiac arrest  BL Cr 1.7? Therefore may be resolved as Cr now is 1.1 - at baseline  - Monitor renal function  - Avoid nephrotoxic meds  - Renally dose meds  - Strictly  monitor urine output    GI:  #Transamnitis - resolved  - Likely 2/2  shock liver  - Monitor LFTs daily  - Hold Tylenol    Nutrition: Continue tube feeds    ID:  #Sirs  - On 06/09 with increasing leukocytosis and no fever suspected due to cooling blanket.   - Suspect patient could have had an aspiration event after cardiac arrest and is either experiencing PNA vs pneumonitis.  - BCx 06/08: pending.BCx 06/06: NGD3.MRSA negative.  - Started on Zosyn and vancomycin on 06/08 now with improvement on leukocytosis. Will discontinue vancomycin and continue zosyn until at least 48h for BCxs on 06/08.           Hem/Onc:  #Rising leukocytosis  Reactive vs pneumonia  - Infectious workup as above  - Monitor with daily CBC    #Normocytic normochromia anemia  - No evidence of active bleeding. Could be a component of chronic disease (CKD). Will rule out hemolysis.    Endo:  #T2DM  - Remains hyperglycemic while on continuous tube feeds.  - Increase Lantus to 14 units daily. Continue regular 6 untis q6h. Continue regular SSI.    Prophylaxis:  - VTE:  SubQ Heparin  - PPI:  Pepcid  - BM: pericolace    Code Status: Full    Lines/Drains/Airways:    Patient Lines/Drains/Airways Status    Active Lines, Drains and Airways     Name:   Placement date:   Placement time:   Site:   Days:    CVC Triple Lumen 08/17/18 Right Internal jugular   08/17/18    1800    Internal jugular   2    Peripheral IV 08/17/18 Left Forearm   08/17/18    --    Forearm   3    Urethral Catheter Non-latex 16 Fr.   08/17/18    0003    Non-latex   3    ETT  7.5 mm   08/16/18    2342     3    Peripheral Arterial Line 08/17/18 Left Radial   08/17/18    1800    Radial   2    NG/OG Tube Orogastric Right mouth   08/16/18    2353    Right mouth   3         Inactive Lines, Drains and Airways     Name:   Placement date:   Placement time:   Removal date:   Removal time:   Site:   Days:    [REMOVED] Peripheral IV 08/16/18 Right Antecubital   08/16/18    2341    08/19/18    1223    Antecubital   2    [REMOVED] Peripheral IV 08/16/18 Right Forearm    08/16/18    2351    08/19/18    1714    Forearm   2    [REMOVED] Intraosseous Line 08/17/18 Tibia   08/17/18    --    08/17/18    0630    Anterior;Right   less than 1                  Signed by: Marrion Coy, MD  Date/Time: 08/20/18 7:08 AM

## 2018-08-20 NOTE — Progress Notes (Addendum)
[facility]   PROGRESS NOTE     [facility]       Date Time: 08/20/18 11:15 AM Patient Name: Tara Perry, Tara Perry  Medical Record #:  [id number] Account#:  [id number] Admission Date:  08/16/2018  Primary Cardiologist: Timmothy Euler MD, [facility]     EP Attending Impressions:    My a/p as below. Seen / examined earlier. Remains intubated. Will follow, with consideration for secondary prevention ICD before Delta, pending neurologic (and otherwise) recovery.  Thanks, Particia Lather, MD  [facility]   [phone]; [phone] FFX Spectralink.       Assessment:   1.  VF arrest, witnessed with bystander CPR performed, electrically resuscitated with 3 defibrillations, 1 ampule of epinephrine given.     -Hypothermia protocol initiated, intubated   2.  Acute kidney injury likely due to #1  3.  History of nonischemic cardiomyopathy as noted by echocardiogram findings below, lowest EF was 25% in July 2017, and October 2017 with Entresto the EF increased to 40%.  4. LBBB, chronic  5.  Troponin 0 0.82, unlikely ACS and more likely type II troponin leak  6.  Cath 2008, normal coronary arteries  7.  Diabetes mellitus    Recommendations:    EF now 20%, recommend BiV ICD implant and agree with consideration of ischemic eval once pt demonstrates full neurologic recovery. NSR overnight.     Medications:      Scheduled Meds:    atorvastatin, 10 mg, Oral, Daily  famotidine, 10 mg, Oral, Daily  heparin (porcine), 5,000 Units, Subcutaneous, Q12H SCH  [START ON 08/21/2018] insulin glargine, 14 Units, Subcutaneous, QAM  insulin regular, 1-8 Units, Subcutaneous, 4 times per day  insulin regular, 6 Units, Subcutaneous, 4 times per day  piperacillin-tazobactam, 4.5 g, Intravenous, Q8H  senna-docusate, 1 tablet, Oral, QHS        Continuous Infusions:   dexmedetomidine 1.5 mcg/kg/hr (08/20/18 1000)    norepinephrine (LEVOPHED) infusion 0 mcg/min (08/19/18 1700)        Subjective:   Intubated/sedated     Physical Exam:     VITAL SIGNS PHYSICAL  EXAM   Temp:  [97.2 F (36.2 C)-100.4 F (38 C)] 99.1 F (37.3 C)  Heart Rate:  [66-130] 78  Resp Rate:  [12-33] 20  FiO2:  [30 %-40 %] 40 %  Temp (24hrs), Avg:98.5 F (36.9 C), Min:97.2 F (36.2 C), Max:100.4 F (38 C)          Intake/Output Summary (Last 24 hours) at 08/20/2018 1115  Last data filed at 08/20/2018 1000  Gross per 24 hour   Intake 1701.7 ml   Output 815 ml   Net 886.7 ml    Physical Exam  General: intubated  Head: normocephalic  Cardiovascular: RRR no m/r/g  Neck: no carotid bruits or JVD  Lungs: clear to auscultation bilaterally, without wheezing, rhonchi, or rales  Abdomen: soft, non-distended; normoactive bowel sounds  Extremities: no edema  Psych/Neuro: intubated, not A/O  .         Labs:           Recent Labs   Lab 08/18/18  0824   Magnesium 2.9*     Recent Labs   Lab 08/18/18  2257 08/18/18  1533   PT  --  13.8   PT INR  --  1.1   PTT 34 28     Recent Labs   Lab 08/20/18  0308 08/19/18  0522 08/18/18  0116   WBC 12.07* 17.80* 13.89*  Hgb 9.1* 11.1* 10.6*   Hematocrit 27.4* 33.0* 31.7*   Platelets 155 204 197     Recent Labs   Lab 08/20/18  0308 08/19/18  0522 08/18/18  2257   Sodium 138 136 138   Potassium 3.9 4.7 4.0   Chloride 110 109 110   CO2 19* 18* 20*   BUN 24.0* 22.0* 20.0*   Creatinine 1.1* 1.4* 1.2*   EGFR 48.6 36.8 43.9   Glucose 261* 266* 237*   Calcium 8.2 8.5 8.0           Invalid input(s): FREET4  Recent Labs   Lab 08/20/18  0308   Bilirubin, Total 0.4   Bilirubin, Direct 0.2   Protein, Total 5.1*   Albumin 2.6*   ALT 46   AST (SGOT) 26     Recent Labs   Lab 08/17/18  1923 08/17/18  0621 08/16/18  2339   Troponin I 0.35* 0.82* 0.01     .  Lab Results   Component Value Date    BNP 139 (H) 08/16/2018      Estimated Creatinine Clearance: 42.9 mL/min (A) (based on SCr of 1.1 mg/dL (H)).  Diagnostics:   Telemetry: SR     Signed by     Edythe Lynn, PA-C  Cardiac Electrophysiology  [facility]  [facility]    Arrhythmia Spectralink [phone] (8am-4:30pm)  After  hours, non urgent consult line [phone]  After Hours, urgent consults [phone]

## 2018-08-20 NOTE — Progress Notes (Signed)
CCU Cardiology Consult Daily Note  Patient's Name: Tara Perry   Attending Provider: Sharyn Lull, MD  Admit Date:08/16/2018  Medical Record Number: [id number]   Room:  FI111/FI111-01  Date/Time: 08/20/18 5:55 PM   Cardiology: [facility]    Impression/Recommendations-  Mrs. Bruington is a 73 year old female with a history of heart failure with reduced ejection fraction with transient recovery who presented with ventricular fibrillation.  She had a witnessed cardiac arrest with immediate CPR.  ROSC was obtained after 3 cardioversions and a single dose of epinephrine.  She was started on hypothermic protocol with goal temperature reached to 815 on August 17, 2018.  A repeat echocardiogram demonstrated an EF of 20%.  She was extubated on 08/20/2018    #1. VF arrest  - holding anti-arrhythmics per EP  - will need a CRT-D device once she demonstrates adequate neurologic recovery  - will pursue ischemic evaluation tomorrow  - will add low dose BB tomorrow    #2. Non-ischemic cardiomyopathy  - much more hemodynamically stable today, off pressors x 24 hours  - will start low dose GDMT tomorrow  - not significantly overloaded on exam , PRN diuresis    #3. Pulmonary edema  - resolved  - plan for extubation today (addendum: achieved)    I have personally reviewed the patients history and 24 hour interval events, along with vitals, labs, radiology images.  Rounds completed with the CICU team.  Treatment of an organ system (cardiac) failure with high probability of life threatening deterioration and life saving interventions.  Critical Care time excluding procedures/teaching = 32 minutes.    Fuller Song, MD [facility]  CICU Consult Attending  ---------------------------------------------------------    73 year old female with history of diabetes and post viral cardiomyopathy, who suffered a witnessed cardiac arrest at home secondary to ventricular dysrhythmia and achieved ROSC post CPR and defibrillationX3. Received in ER with coma  likely secondary to anoxic encephalopathy, not following commands, will be managed with TTM, not requiring pressors, intubated for airway protection.    Review of systems:  No constitutional complaints, no respiratory complaints, no musculoskeletal complaints, or other pertinent review of symptoms except as noted above.    Current medications:  Current Facility-Administered Medications   Medication Dose Route Frequency    atorvastatin  10 mg Oral Daily    famotidine  10 mg Oral Daily    heparin (porcine)  5,000 Units Subcutaneous Q12H SCH    [START ON 08/21/2018] insulin glargine  14 Units Subcutaneous QAM    insulin regular  1-8 Units Subcutaneous 4 times per day    insulin regular  6 Units Subcutaneous 4 times per day    piperacillin-tazobactam  4.5 g Intravenous Q8H    senna-docusate  1 tablet Oral QHS     Allergies:  Allergies   Allergen Reactions    Shellfish-Derived Products Anaphylaxis     Weight:   Wt Readings from Last 4 Encounters:   08/20/18 67.8 kg (149 lb 7.6 oz)   12/29/16 65.8 kg (145 lb)   09/13/12 62.6 kg (138 lb)   02/08/11 61 kg (134 lb 6.4 oz)     Physical Exam:  Vitals:    08/20/18 1722   BP:    Pulse:    Resp:    Temp:    SpO2: 97%      General: Intubated, but sedation weaned and she followed ocmmands  Eyes: No xanthelasma.  Oropharynx: No mucosal pallor.  Neck: Trachea midline, unable to assess  JVD (R IJ)  Pulmonary: Normal respiratory effort, clear lung sounds bilaterally.  Cardiovascular: PMI nondisplaced with normal rate and rhythm, normal S1, normal S2, no murmur, rub, or gallop appreciated.  Abdomen: Soft  Extremities: No edema, no clubbing, no cyanosis.  Warm and perfused  Skin: No tendinous xanthomas  Psych: Follows commands off sedation    ECG:   Previous:  NSR, LBBB in 2018  Today:  NSR, LBBB (no Sgarbosa's criteria)    Previous cardiac testing (08/19/18): mildly dilated LV, EF 21%, mild MR and TR.  Compared to previous study dated 12/2015 there has been a decline in EF from 40%  to 21%    Laboratory:    Recent Labs     08/20/18  0308 08/19/18  0522 08/18/18  2257 08/18/18  1529 08/18/18  0824 08/18/18  0116 08/17/18  1806   Sodium 138 136 138 136 138 138 137   Potassium 3.9 4.7 4.0 3.9 4.1 4.4 4.7   Chloride 110 109 110 108 109 109 108   CO2 19* 18* 20* 18* 19* 19* 18*   BUN 24.0* 22.0* 20.0* 21.0* 23.0* 25.0* 28.0*   Calcium 8.2 8.5 8.0 8.2 8.2 8.3 8.3   Calcium, Ionized  --  2.54 2.49 2.44 2.52 2.41 2.37   Magnesium  --   --   --   --  2.9* 1.9 1.8   Phosphorus  --  3.9 3.2 3.2 3.5 4.3 4.8*     Recent Labs     08/20/18  0308 08/19/18  0522 08/18/18  0116   WBC 12.07* 17.80* 13.89*   Hgb 9.1* 11.1* 10.6*   Hematocrit 27.4* 33.0* 31.7*   Platelets 155 204 197     Recent Labs     08/18/18  2257 08/18/18  1533 08/18/18  0824 08/18/18  0116   PT  --  13.8 14.1 13.8   PT INR  --  1.1 1.1 1.1   PTT 34 28 30 29           Signed by: Dianna Rossetti, MD [facility]  Date/Time: 08/20/18 5:55 PM

## 2018-08-20 NOTE — Plan of Care (Signed)
Problem: Non-Violent Restraints Interdisciplinary Plan  Goal: Will be injury free during the use of non-violent restraints  Outcome: Progressing  Flowsheets (Taken 08/20/2018 0554)  Will be injury free during the use of non-violent restraints:   Attempt all alternatives before use of restraints   Initiate least restrictive type of restraint that is effective   Provide and maintain safe environment   Notify family of initiation of restraints   Include patient/family/caregiver in decisions related to safety   Nurse to accompany patient off unit when on restraints   Remove restraints before the indicated maximum length of time when meets criteria for discontinuation   Document significant changes in patient condition   Document observed patient actions according to protocol   Ensure safety devices are properly applied and maintained   Provide debriefing as soon as possible and appropriate   Reassess need for continued restraints   Ensure that order for restraints has not expired     Problem: Safety  Goal: Patient will be free from injury during hospitalization  Outcome: Progressing  Flowsheets (Taken 08/20/2018 0554)  Patient will be free from injury during hospitalization:   Assess patient's risk for falls and implement fall prevention plan of care per policy   Provide and maintain safe environment   Include patient/ family/ care giver in decisions related to safety   Assess for patients risk for elopement and implement Elopement Risk Plan per policy   Provide alternative method of communication if needed (communication boards, writing)     Problem: Safety  Goal: Patient will be free from infection during hospitalization  Outcome: Progressing  Flowsheets (Taken 08/20/2018 0554)  Free from Infection during hospitalization:   Assess and monitor for signs and symptoms of infection   Monitor all insertion sites (i.e. indwelling lines, tubes, urinary catheters, and drains)   Encourage patient and family to use good hand  hygiene technique   Monitor lab/diagnostic results     Problem: Pain  Goal: Pain at adequate level as identified by patient  Outcome: Progressing  Flowsheets (Taken 08/20/2018 0554)  Pain at adequate level as identified by patient:   Identify patient comfort function goal   Assess for risk of opioid induced respiratory depression, including snoring/sleep apnea. Alert healthcare team of risk factors identified.   Assess pain on admission, during daily assessment and/or before any "as needed" intervention(s)   Reassess pain within 30-60 minutes of any procedure/intervention, per Pain Assessment, Intervention, Reassessment (AIR) Cycle   Evaluate if patient comfort function goal is met   Evaluate patient's satisfaction with pain management progress   Offer non-pharmacological pain management interventions

## 2018-08-20 NOTE — Progress Notes (Signed)
Restraints d/c'd d/t to patient compliance and presence of 1:1 sitter.

## 2018-08-20 NOTE — Plan of Care (Signed)
Patient had central chest pain earlier in the night for which an EKG was done-- it was mostly unchanged from prior EKG without ST changes consistent with ACS. Patient's pain was reproducible on palpation and is likely secondary to the chest compressions she received. Troponins x2 were ordered to be cautious given concern for CAD in this patient. Dilaudid 0.2mg  Q4H PRN ordered as patient not safe to swallow prior to SLP eval.     Signed:  Alfred Levins, MD  CICU Resident

## 2018-08-20 NOTE — Plan of Care (Addendum)
Updated family on patient's improving status and likely extubation today.     Addendum: 3:38pm- updated patient's family on successful extubation.    Signed:  Alfred Levins, MD  CICU Resident

## 2018-08-20 NOTE — Progress Notes (Signed)
Time out pre- procedure pause performed with Dr. Kelvin Cellar and Dr. Lovenia Kim. Physician/ APP gave go ahead. All necessary equipment was available. Patient extubated to  6L NC.        08/20/18 1523   Extubation   Extubation reason Param/CPAP Protocol;Ordered - No protocol   Extubated to Nasal cannula   Adverse Reactions None

## 2018-08-21 ENCOUNTER — Encounter
Admission: EM | Disposition: A | Discharge status: Home Health | Payer: Self-pay | Source: Home / Self Care | Attending: Critical Care Medicine

## 2018-08-21 DIAGNOSIS — I509 Heart failure, unspecified: Secondary | ICD-10-CM

## 2018-08-21 DIAGNOSIS — I340 Nonrheumatic mitral (valve) insufficiency: Secondary | ICD-10-CM

## 2018-08-21 DIAGNOSIS — I360 Nonrheumatic tricuspid (valve) stenosis: Secondary | ICD-10-CM

## 2018-08-21 HISTORY — PX: LHC W/ CORONARY ANGIOS AND LV: CATH8

## 2018-08-21 LAB — GFR
EGFR: 48.6
EGFR: 48.6

## 2018-08-21 LAB — HEPATIC FUNCTION PANEL
ALT: 37 U/L (ref 0–55)
AST (SGOT): 30 U/L (ref 5–34)
Albumin/Globulin Ratio: 1 (ref 0.9–2.2)
Albumin: 2.7 g/dL — ABNORMAL LOW (ref 3.5–5.0)
Alkaline Phosphatase: 94 U/L (ref 37–106)
Bilirubin Direct: 0.4 mg/dL (ref 0.0–0.5)
Bilirubin Indirect: 0.2 mg/dL (ref 0.2–1.0)
Bilirubin, Total: 0.6 mg/dL (ref 0.2–1.2)
Globulin: 2.6 g/dL (ref 2.0–3.6)
Protein, Total: 5.3 g/dL — ABNORMAL LOW (ref 6.0–8.3)

## 2018-08-21 LAB — ECG 12-LEAD
Atrial Rate: 107 {beats}/min
Atrial Rate: 106 {beats}/min
Atrial Rate: 87 {beats}/min
P Axis: 67 degrees
P Axis: 79 degrees
P Axis: 67 degrees
P-R Interval: 150 ms
P-R Interval: 160 ms
P-R Interval: 164 ms
Q-T Interval: 384 ms
Q-T Interval: 398 ms
Q-T Interval: 426 ms
QRS Duration: 168 ms
QRS Duration: 166 ms
QRS Duration: 156 ms
QTC Calculation (Bezet): 512 ms
QTC Calculation (Bezet): 528 ms
QTC Calculation (Bezet): 512 ms
R Axis: -51 degrees
R Axis: -58 degrees
R Axis: -34 degrees
T Axis: 101 degrees
T Axis: 99 degrees
T Axis: 69 degrees
Ventricular Rate: 107 {beats}/min
Ventricular Rate: 106 {beats}/min
Ventricular Rate: 87 {beats}/min

## 2018-08-21 LAB — GLUCOSE WHOLE BLOOD - POCT
Whole Blood Glucose POCT: 133 mg/dL — ABNORMAL HIGH (ref 70–100)
Whole Blood Glucose POCT: 173 mg/dL — ABNORMAL HIGH (ref 70–100)
Whole Blood Glucose POCT: 224 mg/dL — ABNORMAL HIGH (ref 70–100)

## 2018-08-21 LAB — BASIC METABOLIC PANEL
BUN: 23 mg/dL — ABNORMAL HIGH (ref 7.0–19.0)
BUN: 23 mg/dL — ABNORMAL HIGH (ref 7.0–19.0)
CO2: 23 mEq/L (ref 22–29)
CO2: 22 mEq/L (ref 22–29)
Calcium: 8.3 mg/dL (ref 7.9–10.2)
Calcium: 8.3 mg/dL (ref 7.9–10.2)
Chloride: 111 mEq/L (ref 100–111)
Chloride: 111 mEq/L (ref 100–111)
Creatinine: 1.1 mg/dL — ABNORMAL HIGH (ref 0.6–1.0)
Creatinine: 1.1 mg/dL — ABNORMAL HIGH (ref 0.6–1.0)
Glucose: 127 mg/dL — ABNORMAL HIGH (ref 70–100)
Glucose: 132 mg/dL — ABNORMAL HIGH (ref 70–100)
Potassium: 3.5 mEq/L (ref 3.5–5.1)
Potassium: 3.6 mEq/L (ref 3.5–5.1)
Sodium: 142 mEq/L (ref 136–145)
Sodium: 142 mEq/L (ref 136–145)

## 2018-08-21 LAB — TROPONIN I
Troponin I: 0.19 ng/mL — ABNORMAL HIGH (ref 0.00–0.05)
Troponin I: 0.6 ng/mL (ref 0.00–0.05)

## 2018-08-21 LAB — CBC AND DIFFERENTIAL
Absolute NRBC: 0 10*3/uL (ref 0.00–0.00)
Basophils Absolute Automated: 0.05 10*3/uL (ref 0.00–0.08)
Basophils Automated: 0.4 %
Eosinophils Absolute Automated: 0.14 10*3/uL (ref 0.00–0.44)
Eosinophils Automated: 1.2 %
Hematocrit: 26.5 % — ABNORMAL LOW (ref 34.7–43.7)
Hgb: 8.7 g/dL — ABNORMAL LOW (ref 11.4–14.8)
Immature Granulocytes Absolute: 0.08 10*3/uL — ABNORMAL HIGH (ref 0.00–0.07)
Immature Granulocytes: 0.7 %
Lymphocytes Absolute Automated: 1.25 10*3/uL (ref 0.42–3.22)
Lymphocytes Automated: 10.9 %
MCH: 29.3 pg (ref 25.1–33.5)
MCHC: 32.8 g/dL (ref 31.5–35.8)
MCV: 89.2 fL (ref 78.0–96.0)
MPV: 9.9 fL (ref 8.9–12.5)
Monocytes Absolute Automated: 1.07 10*3/uL — ABNORMAL HIGH (ref 0.21–0.85)
Monocytes: 9.3 %
Neutrophils Absolute: 8.87 10*3/uL — ABNORMAL HIGH (ref 1.10–6.33)
Neutrophils: 77.5 %
Nucleated RBC: 0 /100 WBC (ref 0.0–0.0)
Platelets: 176 10*3/uL (ref 142–346)
RBC: 2.97 10*6/uL — ABNORMAL LOW (ref 3.90–5.10)
RDW: 13 % (ref 11–15)
WBC: 11.46 10*3/uL — ABNORMAL HIGH (ref 3.10–9.50)

## 2018-08-21 LAB — CBC
Absolute NRBC: 0 10*3/uL (ref 0.00–0.00)
Hematocrit: 26.2 % — ABNORMAL LOW (ref 34.7–43.7)
Hgb: 8.7 g/dL — ABNORMAL LOW (ref 11.4–14.8)
MCH: 29.5 pg (ref 25.1–33.5)
MCHC: 33.2 g/dL (ref 31.5–35.8)
MCV: 88.8 fL (ref 78.0–96.0)
MPV: 10 fL (ref 8.9–12.5)
Nucleated RBC: 0 /100 WBC (ref 0.0–0.0)
Platelets: 182 10*3/uL (ref 142–346)
RBC: 2.95 10*6/uL — ABNORMAL LOW (ref 3.90–5.10)
RDW: 13 % (ref 11–15)
WBC: 11.49 10*3/uL — ABNORMAL HIGH (ref 3.10–9.50)

## 2018-08-21 LAB — MAGNESIUM: Magnesium: 1.9 mg/dL (ref 1.6–2.6)

## 2018-08-21 SURGERY — LHC W/ CORONARY ANGIOS AND LV
Anesthesia: Conscious Sedation | Laterality: Left

## 2018-08-21 MED ORDER — HEPARIN (PORCINE) IN NACL 2000-0.9 UNIT/L-% IV SOLN
INTRAVENOUS | Status: AC | PRN
Start: 2018-08-21 — End: 2018-08-21
  Administered 2018-08-21: 4000 [IU]

## 2018-08-21 MED ORDER — LABETALOL HCL 5 MG/ML IV SOLN (WRAP)
10.00 mg | Freq: Once | INTRAVENOUS | Status: DC
Start: 2018-08-21 — End: 2018-08-21

## 2018-08-21 MED ORDER — HEPARIN SODIUM (PORCINE) 1000 UNIT/ML IJ SOLN
INTRAMUSCULAR | Status: AC | PRN
Start: 2018-08-21 — End: 2018-08-21
  Administered 2018-08-21: 3000 [IU] via INTRAVENOUS

## 2018-08-21 MED ORDER — HEPARIN (PORCINE) IN NACL 2000-0.9 UNIT/L-% IV SOLN
INTRAVENOUS | Status: AC
Start: 2018-08-21 — End: ?
  Filled 2018-08-21: qty 2000

## 2018-08-21 MED ORDER — HYDRALAZINE HCL 20 MG/ML IJ SOLN
5.00 mg | Freq: Four times a day (QID) | INTRAMUSCULAR | Status: DC | PRN
Start: 2018-08-21 — End: 2018-08-22
  Administered 2018-08-21 – 2018-08-22 (×3): 5 mg via INTRAVENOUS
  Filled 2018-08-21 (×3): qty 1

## 2018-08-21 MED ORDER — LIDOCAINE 5 % EX PTCH
1.00 | MEDICATED_PATCH | CUTANEOUS | Status: AC
Start: 2018-08-21 — End: 2018-08-22

## 2018-08-21 MED ORDER — INSULIN GLARGINE 100 UNIT/ML SC SOLN
7.00 [IU] | Freq: Every morning | SUBCUTANEOUS | Status: DC
Start: 2018-08-21 — End: 2018-08-23
  Administered 2018-08-22 – 2018-08-23 (×2): 7 [IU] via SUBCUTANEOUS
  Filled 2018-08-21 (×2): qty 7

## 2018-08-21 MED ORDER — HEPARIN SODIUM (PORCINE) 1000 UNIT/ML IJ SOLN
INTRAMUSCULAR | Status: AC
Start: 2018-08-21 — End: ?
  Filled 2018-08-21: qty 10

## 2018-08-21 MED ORDER — MIDAZOLAM HCL 1 MG/ML IJ SOLN (WRAP)
INTRAMUSCULAR | Status: AC
Start: 2018-08-21 — End: ?
  Filled 2018-08-21: qty 2

## 2018-08-21 MED ORDER — ACETAMINOPHEN 160 MG/5ML PO SOLN
650.00 mg | Freq: Four times a day (QID) | ORAL | Status: DC | PRN
Start: 2018-08-21 — End: 2018-08-27
  Administered 2018-08-25 – 2018-08-27 (×4): 650 mg via ORAL
  Filled 2018-08-21 (×4): qty 20.3

## 2018-08-21 MED ORDER — FENTANYL CITRATE (PF) 50 MCG/ML IJ SOLN (WRAP)
INTRAMUSCULAR | Status: AC | PRN
Start: 2018-08-21 — End: 2018-08-21
  Administered 2018-08-21: 25 ug via INTRAVENOUS

## 2018-08-21 MED ORDER — LIDOCAINE 5 % EX PTCH
1.00 | MEDICATED_PATCH | CUTANEOUS | Status: DC
Start: 2018-08-21 — End: 2018-08-21

## 2018-08-21 MED ORDER — MAGNESIUM SULFATE IN D5W 1-5 GM/100ML-% IV SOLN
1.0000 g | INTRAVENOUS | Status: AC
Start: 2018-08-21 — End: 2018-08-21
  Administered 2018-08-21 (×2): 1 g via INTRAVENOUS

## 2018-08-21 MED ORDER — LIDOCAINE 5 % EX PTCH
2.00 | MEDICATED_PATCH | CUTANEOUS | Status: DC
Start: 2018-08-22 — End: 2018-08-27
  Administered 2018-08-22 – 2018-08-27 (×6): 2 via TRANSDERMAL
  Filled 2018-08-21 (×5): qty 2
  Filled 2018-08-21: qty 1
  Filled 2018-08-21: qty 2

## 2018-08-21 MED ORDER — HYDROCORTISONE SOD SUC (PF) 100 MG IJ SOLR (WRAP)
INTRAMUSCULAR | Status: AC | PRN
Start: 2018-08-21 — End: 2018-08-21
  Administered 2018-08-21: 100 mg via INTRAVENOUS

## 2018-08-21 MED ORDER — POTASSIUM CHLORIDE 20 MEQ/50ML IV SOLN
20.0000 meq | INTRAVENOUS | Status: AC
Start: 2018-08-21 — End: 2018-08-21
  Administered 2018-08-21 (×2): 20 meq via INTRAVENOUS

## 2018-08-21 MED ORDER — FENTANYL CITRATE (PF) 50 MCG/ML IJ SOLN (WRAP)
INTRAMUSCULAR | Status: AC
Start: 2018-08-21 — End: ?
  Filled 2018-08-21: qty 2

## 2018-08-21 MED ORDER — LIDOCAINE HCL (PF) 1 % IJ SOLN
INTRAMUSCULAR | Status: AC
Start: 2018-08-21 — End: ?
  Filled 2018-08-21: qty 30

## 2018-08-21 MED ORDER — HYDROCORTISONE SOD SUC (PF) 100 MG IJ SOLR (WRAP)
INTRAMUSCULAR | Status: AC
Start: 2018-08-21 — End: ?
  Filled 2018-08-21: qty 2

## 2018-08-21 MED ORDER — MIDAZOLAM HCL 1 MG/ML IJ SOLN (WRAP)
INTRAMUSCULAR | Status: AC | PRN
Start: 2018-08-21 — End: 2018-08-21
  Administered 2018-08-21: 1 mg via INTRAVENOUS

## 2018-08-21 NOTE — Progress Notes (Signed)
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    Congestive heart failure     Diabetes mellitus 1992    Gout spondylitis     Gout synovitis     Gout synovitis       ACCESS:    53F sheath in right radial artery  Hemostasis: TR band, 10 cc of air  Post procedure pulses: palpable in right arm/hand  Visual appearance: clean/dry/intact with good distal pulses   MEDICATIONS:    Versed: 1 mg IV  Fentanyl: 25 mcg IV  Heparin:  3000 units IV  Solucortef: 100 mg IV  IV Drips: KVO during procedure  VITALS:    HR:90         Rhythm: sinus rhythm and LBBB       BP: 146/68   O2 SAT: 98%        PROCEDURE DETAILS:    Outcomes: diagnostic cath                                           Complications: none    Final Chest Pain Assessment::0/10    Report given to:     See Physicians Op note/ Report for details

## 2018-08-21 NOTE — SLP Progress Note (Signed)
[facility]   Speech Therapy Cancellation Note      Patient:  Tara Perry MRN#:  [id number]  Unit:  [facility] CICU Room/Bed:  FI111/FI111-01    08/21/2018  Time: 836      New orders received. Patient not seen for speech therapy per RN request. Patient needs to remain NPO for possible procedure. Will follow up as able/medically appropriate.     Rosebud Poles MS CCC-SLP  08/21/2018

## 2018-08-21 NOTE — Plan of Care (Signed)
Problem: Moderate/High Fall Risk Score >5  Goal: Patient will remain free of falls  Outcome: Progressing     Problem: Non-Violent Restraints Interdisciplinary Plan  Goal: Will be injury free during the use of non-violent restraints  Outcome: Progressing     Problem: Safety  Goal: Patient will be free from injury during hospitalization  Outcome: Progressing  Goal: Patient will be free from infection during hospitalization  Outcome: Progressing     Problem: Pain  Goal: Pain at adequate level as identified by patient  Outcome: Progressing     Problem: Side Effects from Pain Analgesia  Goal: Patient will experience minimal side effects of analgesic therapy  Outcome: Progressing     Problem: Discharge Barriers  Goal: Patient will be discharged home or other facility with appropriate resources  Outcome: Progressing     Problem: Psychosocial and Spiritual Needs  Goal: Demonstrates ability to cope with hospitalization/illness  Outcome: Progressing     Problem: Compromised Tissue integrity  Goal: Damaged tissue is healing and protected  Outcome: Progressing  Goal: Nutritional status is improving  Outcome: Progressing

## 2018-08-21 NOTE — Transfer Summary (Signed)
CCU Transfer summary     Ms Tara Perry is a 73 y/o woman with PMHx of DMII and post viral cardiomyopathy who underwent a witnessed cardiac arrest at home secondary to ventricular dysrhythmia and achieved ROSC post CPR and defibrillationX3.     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 She was intubated for airway protection, and upgraded to CICU for hypothermia protocol.    She completed the hypothermia protocol on 6/8 with successful rewarming. She demonstrated improvement in her neurological function, and was extubated on 6/9. On  6/10, she had a LHC that demonstrated no CAD. She will have BiV ICD placed on Monday, 6/15.    Post-extubation, the patient still has dysphagia and resolved delirium. She failed speech evaluation x2, but she is demonstrating daily improvement in her neurologic function. Today she demonstrated ketonuria and acidosis c/w starvation ketosis. Patient wants to try another speech evaluation today, and was amenable to receive an NG tube if she fails again. She is otherwise hemodynamically stable.        Spoke with Dr Carolin Coy who accepted patient at 2:30 pm.  [facility] will follow  Transfer to: PCCU    Veverly Fells, MD PGY-1  [facility] Internal Medicine Residency Program

## 2018-08-21 NOTE — Procedures (Signed)
Brief Cardiac Catheterization Note:    Please refer to the complete Procedural Note/Anatomic Diagram under "Cardiac Procedures" tab in EPIC for full details    73 yo F DM, Obesity, CKD, Anemia, prior NICM, LBBB ECG, possible IV contrast allergy (to be pre-medicated in Cath lab), admit VF arrest with CPR/ROSC and follow-on severe LV dysfunction by Echo, now referred for LHC, Cor Angio, possible PCI.    Conclusions:    1.  No coronary artery disease.  2.  Nonischemic cardiomyopathy.    Recommendations:    1.  Radial Band off 2 hours.  2.  Defer further management to CICU team.          --------------------  Burna Forts, MD  Interventional Cardiology  [facility]  SL [phone]

## 2018-08-21 NOTE — Progress Notes (Signed)
ICU/CCU Daily Progress Note    Patient's Name: MAISON AGRUSA    Room:  FI111/FI111-01  Attending Provider: Sharyn Lull, MD  Admit Date:08/16/2018  Medical Record Number: [id number]     Date/Time: 08/21/18 8:00 AM      Ms Giannelli is a 73 y/o woman with PMHx of DMII and post viral cardiomyopathy who underwent a witnessed cardiac arrest at home secondary to ventricular dysrhythmia and achieved ROSC post CPR and defibrillationX3.     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 She was intubated for airway protection, and upgraded to CICU for hypothermia protocol.    She completed the hypothermia protocol on 6/8 with successful rewarming. She demonstrated improvement in her neurological function, and was extubated on 6/9. Overnight, she developed chest pain with slight ECG changes and slight troponin leak c/w Type II NSTEMI given LHC this morning demonstrated no CAD. The chest pain has since resolved. Will proceed with medical downgrade with BiV ICD before discharge.       24hr events:  Had chest pain overnight, repeat ECG showed new repolarization changes with deeper Qs in V1 and V6. Troponins peaked at 0.6. LHC demonstrated no CAD.    Spoke to patient at bedside. She states "she feels lousy but better." her chest pain is improving. Says it it constant. She denies, vomiting, SOB, dizziness, or weakness.      Patient Active Problem List   Diagnosis    Peritonitis    Viral cardiomyopathy    Type II diabetes mellitus without mention of complication    Gout synovitis    Pulmonary edema    Type 2 diabetes mellitus without complications    SIRS (systemic inflammatory response syndrome)    Viral cardiomyopathy    Dyspnea    CHF (congestive heart failure)    Cardiac arrest       VITAL SIGNS PHYSICAL EXAM   Temp:  [97.5 F (36.4 C)-99.1 F (37.3 C)] 98.6 F (37 C)  Heart Rate:  [64-112] 91  Resp Rate:  [14-51] 41  BP: (120-147)/(57-101) 147/63  FiO2:  [40  %] 40 %  Vent Settings  Vent Mode: PS/CPAP  FiO2: 40 %  Resp Rate (Set): 12  Vt (Set, mL): 440 mL  PIP Observed (cm H2O): 12 cm H2O  PEEP/EPAP: 6 cm H20  Pressure Support / IPAP: 6 cmH20  Mean Airway Pressure: 7 cmH20    Intake/Output Summary (Last 24 hours) at 08/21/2018 0800  Last data filed at 08/21/2018 0700  Gross per 24 hour   Intake 757.75 ml   Output 1200 ml   Net -442.25 ml    Physical Exam  Gen: Patient resting comfortably. Follows commands.   HEENT: Moist oral mucosa. PERRLA. No scleral icterus  Neck: No LAD  Cardiac: S1 and S2 audible and regular. No murmurs or rubs present  Lungs: CTA x2  Abdomen: + BS in all four quadrants. NT/ND. No rigidity or guarding   Ext: Peripheral pulses +2. No edema or cyanosis present  Neuro: Answers questions appropriately, UE muscle strength 4/5. No facial droop  Skin: No petechiae, UE ecchymoses, warm and dry          Scheduled Meds: PRN Meds:    atorvastatin, 10 mg, Oral, Daily  famotidine, 10 mg, Oral, Daily  heparin (porcine), 5,000 Units, Subcutaneous, Q12H SCH  insulin glargine, 7 Units, Subcutaneous, QAM  insulin regular, 1-8 Units, Subcutaneous, 4 times per day  insulin regular,  6 Units, Subcutaneous, 4 times per day  lidocaine, 1 patch, Transdermal, Q24H  piperacillin-tazobactam, 4.5 g, Intravenous, Q8H  senna-docusate, 1 tablet, Oral, QHS        Continuous Infusions:   acetaminophen, 325 mg, Q6H PRN  dextrose, 15 g of glucose, PRN    And  dextrose, 12.5 g, PRN    And  glucagon (rDNA), 1 mg, PRN  HYDROmorphone, 0.2 mg, Q4H PRN            Labs (last 72 hours):  Recent Labs     08/21/18  0308 08/21/18  0118 08/20/18  0308   WBC 11.49* 11.46* 12.07*   Hgb 8.7* 8.7* 9.1*   Hematocrit 26.2* 26.5* 27.4*     Recent Labs     08/18/18  2257 08/18/18  1533 08/18/18  0824   PT  --  13.8 14.1   PT INR  --  1.1 1.1   PTT 34 28 30    Recent Labs     08/21/18  0308 08/21/18  0118 08/20/18  0308 08/19/18  0522 08/18/18  2257 08/18/18  1529 08/18/18  0824   Sodium 142 142 138 136 138  136 138   Potassium 3.6 3.5 3.9 4.7 4.0 3.9 4.1   Chloride 111 111 110 109 110 108 109   CO2 22 23 19* 18* 20* 18* 19*   BUN 23.0* 23.0* 24.0* 22.0* 20.0* 21.0* 23.0*   Creatinine 1.1* 1.1* 1.1* 1.4* 1.2* 1.2* 1.2*   Glucose 132* 127* 261* 266* 237* 251* 186*   Calcium 8.3 8.3 8.2 8.5 8.0 8.2 8.2   Magnesium 1.9  --   --   --   --   --  2.9*   Phosphorus  --   --   --  3.9 3.2 3.2 3.5                 Microbiology:   Microbiology Results     Procedure Component Value Units Date/Time    Blood Culture Aerobic/Anaerobic #1 [[id number]] Collected:  08/17/18 0105    Specimen:  Arm from Blood, Venipuncture Updated:  08/21/18 0421    Narrative:       ORDER#: [id number]                                    ORDERED BY: Effie Shy, PATRIC  SOURCE: Blood, Venipuncture steripath                COLLECTED:  08/17/18 01:05  ANTIBIOTICS AT COLL.:                                RECEIVED :  08/17/18 03:28  Culture Blood Aerobic and Anaerobic        PRELIM      08/21/18 04:21  08/18/18   No Growth after 1 day/s of incubation.  08/19/18   No Growth after 2 day/s of incubation.  08/20/18   No Growth after 3 day/s of incubation.  08/21/18   No Growth after 4 day/s of incubation.      Blood Culture Aerobic/Anaerobic #2 [[id number]] Collected:  08/17/18 0216    Specimen:  Arm from Blood, Venipuncture Updated:  08/21/18 0421    Narrative:       ORDER#: [id number]  ORDERED BY: COLEMAN, PATRIC  SOURCE: Blood, Venipuncture steripath  L Ac          COLLECTED:  08/17/18 02:16  ANTIBIOTICS AT COLL.:                                RECEIVED :  08/17/18 03:28  Culture Blood Aerobic and Anaerobic        PRELIM      08/21/18 04:21  08/18/18   No Growth after 1 day/s of incubation.  08/19/18   No Growth after 2 day/s of incubation.  08/20/18   No Growth after 3 day/s of incubation.  08/21/18   No Growth after 4 day/s of incubation.      COVID-19 (SARS-CoV-2) [facility] Rapid) [[id number]] Collected:  08/17/18 0157    Specimen:   Nasopharyngeal Swab Updated:  08/17/18 0323     Purpose of COVID testing Screening     SARS-CoV-2 Specimen Source Nasopharyngeal     SARS CoV 2 Overall Result Negative     Comment: Test performed using the Abbott ID NOW EUA assay.  Please see Fact Sheets for patients and providers located at:  http://www.rice.biz/  This test is for the qualitative detection of SARS-CoV-2  (COVID-19)nucleic acid. Viral nucleic acids may persist in vivo,  independent of viability. Detection of viral nucleic acid does  not imply the presence of infectious virus, or that virus  nucleic acid is the cause of clinical symptoms.  Negative results do not preclude SARS-CoV-2 infection and  should not be used as the sole basis for patient management  decisions.  Invalid results may be due to inhibiting substances in the  specimen and recollection should occur.         Narrative:       o Collect and clearly label specimen type:  o Upper respiratory specimen: One Nasopharyngeal Dry Swab NO  Transport Media.  o Hand deliver to laboratory ASAP    CULTURE BLOOD AEROBIC AND ANAEROBIC [[id number]] Collected:  08/19/18 1505    Specimen:  Blood, Venipuncture Updated:  08/20/18 1921    Narrative:       The order will result in two separate 8-61ml bottles  Please do NOT order repeat blood cultures if one has been  drawn within the last 48 hours.  AVOID BLOOD CULTURE DRAWS FROM CENTRAL LINE IF POSSIBLE  Indications:->Fever of greater than 101.5  ORDER#: [id number]                                    ORDERED BY: Gorden Harms  SOURCE: Blood, Venipuncture                          COLLECTED:  08/19/18 15:05  ANTIBIOTICS AT COLL.:                                RECEIVED :  08/19/18 18:45  Culture Blood Aerobic and Anaerobic        PRELIM      08/20/18 19:21  08/20/18   No Growth after 1 day/s of incubation.      CULTURE BLOOD AEROBIC AND ANAEROBIC [[id number]] Collected:  08/19/18 1505    Specimen:  Arterial Blood Updated:  08/20/18 1921     Narrative:  The order will result in two separate 8-56ml bottles  Please do NOT order repeat blood cultures if one has been  drawn within the last 48 hours.  AVOID BLOOD CULTURE DRAWS FROM CENTRAL LINE IF POSSIBLE  Indications:->Fever of greater than 101.5  ORDER#: [id number]                                    ORDERED BY: Gorden Harms  SOURCE: Arterial Blood                               COLLECTED:  08/19/18 15:05  ANTIBIOTICS AT COLL.:                                RECEIVED :  08/19/18 18:45  Culture Blood Aerobic and Anaerobic        PRELIM      08/20/18 19:21  08/20/18   No Growth after 1 day/s of incubation.      MRSA culture - Nares (If not done in triage) [[id number]] Collected:  08/17/18 0621    Specimen:  Culturette from Nares Updated:  08/18/18 0255     Culture MRSA Surveillance Negative for Methicillin Resistant Staph aureus    MRSA culture - Throat (If not done in triage) [[id number]] Collected:  08/17/18 0621    Specimen:  Culturette from Throat Updated:  08/18/18 0255     Culture MRSA Surveillance Negative for Methicillin Resistant Staph aureus            Imaging:  Ct Head Wo Contrast    Result Date: 08/17/2018  No acute intracranial abnormality. Eloise Harman, MD  08/17/2018 12:36 AM    Xr Chest Ap Portable    Result Date: 08/19/2018  Lines and tubes as described without pneumothorax. Bibasilar atelectasis without focal consolidation identified. Colonel Bald, MD  08/19/2018 12:35 PM    Xr Chest Ap Portable    Result Date: 08/17/2018   Right IJ central venous catheter has been pulled back with tip now at the cavoatrial junction. Shelly Flatten, MD  08/17/2018 6:29 PM    Xr Chest Ap Portable    Result Date: 08/17/2018   1. Right IJ central venous catheter placement with tip in the right atrium. 2. Retrocardiac opacity may represent atelectasis or effusion. Shelly Flatten, MD  08/17/2018 5:28 PM    Chest Ap Portable    Result Date: 08/17/2018   1. ET tube tip 3.2 cm above the carina and gastric drainage tube  sidehole in the stomach and tip below the field-of-view. 2. Central vascular congestion, likely upper lobe edema, and bibasilar atelectasis. Infection/aspiration cannot be entirely excluded. 3. Borderline enlarged cardiopericardial silhouette. Eloise Harman, MD  08/17/2018 12:11 AM        Assessment and Plan:  Ms Starrett is a 73 y/o woman with PMHx of DMII and post viral cardiomyopathy who underwent a witnessed cardiac arrest at home secondary to ventricular dysrhythmia and achieved ROSC post CPR and defibrillationX3.     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 She was intubated for airway protection, and upgraded to CICU for hypothermia protocol.    She completed the hypothermia protocol on 6/8 with successful rewarming. She demonstrated improvement in her neurological function, and was extubated on 6/9.  Overnight, she developed chest pain with slight ECG changes and slight troponin leak c/w Type II NSTEMI given LHC this morning demonstrated no CAD. The chest pain has since resolved. Will proceed with medical downgrade with BiV ICD before discharge.        NEURO:  #Pain from CPR#  - Patient is now s/p rewarming phase. Now off propofol since 06/08.  - S/p Precedex  - Continue Lidocaine patches and PRN Tylenol 650 mg Q6H    #Delirium#  #Preventing Anoxic Brain Injury After Cardiac Arrest - Improving#  - S/p TTM with rewarming and off sedation  - CT head without any acute intracranial abnormality  - Per documentation, appears to have improved. Will initiate delirium precuations      CARDIO:  #VFib Arrest s/p ROSC  ROSC achieved after 1mg  of Epi and 3 shocks (~10 minutes)   Likely in the setting of viral cardiomyopathy, EF 40% with LBBB. Low suspicion for PE. EKG without evidence of acute MI.   - s/p TTM protocol  - Continuous telemetry monitoring  -LHC 6/10 with clean coronaries  - EP consulted, appreciate recs. Will plan for CRT-D placement      #NICM /  HFrEF  -EF40% in 2017  -TTE 06/08 with decreased EF to 21%, LV dilation and multiple wall motion abnormalities.  -Will start carvedilol 3.125 mg BID and valsartan 40 mg PO daily once patient can take PO  -In the interim will use Hydralazine 5 mg IV PRN Q6H      #Transient Cardiogenic Vasopressor Requirement - resolved      PULMONARY:  #Acute Hypoxic Respiratory Failure in the setting of Cardiac Arrest - resolved#  - Extubated on 6/9  - Now on RA      RENAL:  #AKI on CKD - Resolved    GI:  #Transaminitis - resolved      Nutrition:   -NPO due to residual swallowing difficulty from cardiac arrest.   -SLP consulted, appreciate recs  -Will re-evaluate in 24 hours. If patient continues to fail NPO, will place Core-pak    ID:  #Sirs - resolved#  #Concern for PNA#   - On 06/09 with increasing leukocytosis and no fever suspected due to cooling blanket.   -CXR on 6/8 with trace bibasilar opacities  -No fever in 24 hours, or tachycardia or hypotension  - Suspect patient could have had an aspiration event after cardiac arrest and is either experiencing PNA vs pneumonitis.  - BCx 06/08: NGTD x1 .BCx 06/06: NGD4.MRSA negative.  - Started on Zosyn and vancomycin on 06/08 now with improvement on leukocytosis  -S/p Vanc 1.5 mg x1  -On Zosyn 4.5 g Q8H (6/8-)  - Will continue Zosyn for today, and d/c if Blood Cx 6/8 NGTD x2             Hem/Onc:  #Leukocytosis with neutrophilic predominance-Resolving#  -Reactive from injury    #Normocytic normochromia anemia  - No evidence of active bleeding, suspect in setting of phlebotomy with inappropriate bone marrow response  -Retic index of 0.74 c/w hypo-proliferative disorder  -Normal haptoglobin and potassium argues against hemolysis  -Will order CBC with manual differential, iron panel, ferritin, folate, and B12 to r/o nutritional deficiencies      Endo:  #T2DM  - Decrease Lantus to 7 units daily while NPO  -Discontinue regular insuln 6 untis q6h  -Continue q6H SSI, once eating will  d/c      Prophylaxis:  - VTE:  SubQ Heparin  - PPI:  Not indicated  - BM: Pericolace    Spoke with daughter, and provided updates.    Code Status: Full    Lines/Drains/Airways:    Patient Lines/Drains/Airways Status    Active Lines, Drains and Airways     Name:   Placement date:   Placement time:   Site:   Days:    CVC Triple Lumen 08/17/18 Right Internal jugular   08/17/18    1800    Internal jugular   3    Peripheral IV 08/17/18 Left Forearm   08/17/18    --    Forearm   4    Urethral Catheter Non-latex 16 Fr.   08/17/18    0003    Non-latex   4         Inactive Lines, Drains and Airways     Name:   Placement date:   Placement time:   Removal date:   Removal time:   Site:   Days:    [REMOVED] Peripheral IV 08/16/18 Right Antecubital   08/16/18    2341    08/19/18    1223    Antecubital   2    [REMOVED] Peripheral IV 08/16/18 Right Forearm   08/16/18    2351    08/19/18    1714    Forearm   2    [REMOVED] ETT  7.5 mm   08/16/18    2342    08/20/18    1523     3    [REMOVED] Intraosseous Line 08/17/18 Tibia   08/17/18    --    08/17/18    0630    Anterior;Right   less than 1    [REMOVED] Peripheral Arterial Line 08/17/18 Left Radial   08/17/18    1800    08/20/18    1700    Radial   2    [REMOVED] NG/OG Tube Orogastric Right mouth   08/16/18    2353    08/20/18    1522    Right mouth   3                  Signed by: Berenice Bouton, MD  Date/Time: 08/21/18 8:00 AM

## 2018-08-21 NOTE — SLP Eval Note (Signed)
[facility]   Speech and Language Therapy Bedside Swallow Evaluation     [name]    MRN#: [id number]     Consult received for Tara Perry for SLP Bedside Swallow Evaluation and Treatment.    Plan/Recommendations:   Diet Recommendation: NPO with temporary nutrition IF critical meds must be administered on this date. Otherwise, recommend re-assessment in 24 hours.   Medication recommendations: Non oral route  Precautions/Compensations: suction PRN, HOB at 45  Aspiration Precautions posted at bedside: No  Recommendation Discussed With: : Patient;Physician;Nurse  Plan: NPO/alternative feeding  SLP Frequency Recommended: 4-5x/wk    Discharge recommendations: Acute Rehab, Defer to PT/OT recommendation  Assessment:   BSE completed. Patient dysphonic with severely reduced vocal intensity. Patient presents with mild oral phase deficits and suspect mod-severe pharyngeal dysphagia likely in setting of recent intubation  (6/5-6/9). Adequate oral acceptance/bolus extraction, although patient fearful to accept PO, thus took "micro" bites. Delayed A/P transit with no oral residue noted. Suspect pre-spill of bolus and likely delayed pharyngeal swallow initiation. Delayed throat clearing noted with ice chips and immediate weak cough response noted with tsp of thin liquids and nectar liquids. Of note, prolonged coughing and RR increase to 58 with pureed textures. RR quickly recovered to 20 in less than a minute.     SLP cannot recommend safe diet at this time. Recommend NPO with temporary alternative nutrition if critical meds must be administered on this date. Recommend SLP follow closely.    History of Present Illness:   Tara Perry is a 73 y.o. female admitted on 08/16/2018 with "with a history of heart failure with reduced ejection fraction with transient recovery who presented with ventricular fibrillation.  She had a witnessed cardiac arrest with immediate CPR.  ROSC was obtained after 3  cardioversions and a single dose of epinephrine.  She was started on hypothermic protocol with goal temperature reached to 815 on August 17, 2018.  A repeat echocardiogram demonstrated an EF of 20%.  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    Congestive heart failure     Diabetes mellitus 1992    Gout spondylitis     Gout synovitis     Gout synovitis       Past Surgical History:   Procedure Laterality Date    COLONOSCOPY  2002    normal    EXPLORATORY LAPAROTOMY           History/Current Status:  Respiratory Status: within normal limits  Date extubated: 08/20/18  Time extubated: 1523  Days intubated: (4 days)  Behavior/Mental Status: Awake/alert;Able to follow directions;Anxious;Restless  Nutrition: NPO    Speech Therapy History  Speech Therapy History: (NA)    Subjective:   Patient is agreeable to participation in the therapy session. Nursing clears patient for therapy. Patients medical condition is appropriate for Speech therapy intervention at this time.Patient seen at bedside with 1:1 sitter present.     PAIN:  Reports no pain  Objective:   Observation of Patient/Vital Signs:  Patient is in bed with dressings, telemetry, heel protectors, pressure relief boots and SCD's in place.  Interpreter services required: N/A    Oral Assessment:  Oral Assessment  Mucous Membranes: Pink and moist with  firm gums  Oral Comfort: Comfortable  Lips/Corners of Mouth: Smooth/pink/moist  Saliva/Dry Mouth: WNL  Dentition: Adequate       Oral Motor Skills:  Oral Motor Skills  Oral Motor Skills: exceptions to [facility]  Oral Motor Impairments: strength    Deglutition Skills:  Deglutition Skills  Position: upright 90 degrees  Food(s) Tested: ice chips;thin liquid;nectar thick liquid;puree  Oral Stage: bolus  formation/control reduced, slow but effective;suspect premature spillage;suspect impaired swallow initiation  Pharyngeal Stage: suspect delayed response;suspect reduced laryngeal elevation through palpation;multiple swallows per bolus;immediate throat clearing;immediate weak cough;delayed strong cough;increased respiratory rate  Esophageal Stage: appears WFL    Patient left with call bell within reach, all needs met, SCDs in place , fall mat in place, bed alarm activated and all questions answered. RN notified of session outcome and patient response. Discussed NPO recommendations with physician during rounding.     Goals:   1. Patient will tolerate 5/5 ice chips trials without s/s aspiration.   2. Patient will tolerate 5/5 liquid trials without s/s aspiration.       Time of Treatment:  SLP Received On: 08/21/18  Start Time: 1015  Stop Time: 1030  Time Calculation (min): 15 min      Rosebud Poles MS CCC-SLP  08/21/2018

## 2018-08-21 NOTE — Progress Notes (Signed)
CCU Cardiology Consult Daily Note  Patient's Name: Tara Perry   Attending Provider: Sharyn Lull, MD  Admit Date:08/16/2018  Medical Record Number: [id number]   Room:  FI111/FI111-01  Date/Time: 08/21/18 9:47 AM   Cardiology: [facility]    Impression/Recommendations-  Tara Perry is a 73 year old female with a history of heart failure with reduced ejection fraction with transient recovery who presented with ventricular fibrillation.  She had a witnessed cardiac arrest with immediate CPR.  ROSC was obtained after 3 cardioversions and a single dose of epinephrine.  She was started on hypothermic protocol with goal temperature reached to 815 on August 17, 2018.  A repeat echocardiogram demonstrated an EF of 20%.  She was extubated on 08/20/2018 and remains hemodynamically stable. Mild chest pain with uptrending troponin today.     #1. VF arrest  - holding anti-arrhythmics per EP  - will need a CRT-D device once she demonstrates adequate neurologic recovery  - will pursue ischemic evaluation today  - start low dose coreg 3.125 mg BID and up-titrate as tolerated     #2. Non-ischemic cardiomyopathy  - start coreg 3.125 mg BID and Losartan   - not significantly overloaded on exam , PRN diuresis  - start valsartan and coreg 3.125 mg BID     #3. Pulmonary edema  - improved and extubated yesterday  - will need diuresis PRN     #4: elevated troponin: likely reflects demand ischemia in the setting of hypertension and LV dysfunction. I do suspect she has underlying CAD  -plan for LHC today     Samule Ohm, MD  Cardiology Fellow    Attending Addendum:  Patient seen and examined, agree with the above note as written.  LHC today showed no CAD.  She failed a swallow study so unable to start GDMT.  Will reduce afterload with IV hydralazine.  She will require CRT-D once out of CICU.    Fuller Song MD  CICU Cardiology Attending  ---------------------------------------------------------    73 year old female with history of  diabetes and post viral cardiomyopathy, who suffered a witnessed cardiac arrest at home secondary to ventricular dysrhythmia and achieved ROSC post CPR and defibrillationX3. 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    Overnight: Episode of chest pain without significant EKG changes. Trop now slightly uptrending at 0.6<0.19. reports mild chest discomfort, still having some difficulty talking (soft voice)    Review of systems:  No constitutional complaints, no respiratory complaints, no musculoskeletal complaints, or other pertinent review of symptoms except as noted above.    Current medications:  Current Facility-Administered Medications   Medication Dose Route Frequency    atorvastatin  10 mg Oral Daily    famotidine  10 mg Oral Daily    heparin (porcine)  5,000 Units Subcutaneous Q12H SCH    insulin glargine  7 Units Subcutaneous QAM    insulin regular  1-8 Units Subcutaneous 4 times per day    insulin regular  6 Units Subcutaneous 4 times per day    lidocaine  1 patch Transdermal Q24H    piperacillin-tazobactam  4.5 g Intravenous Q8H    senna-docusate  1 tablet Oral QHS     Allergies:  Allergies   Allergen Reactions    Shellfish-Derived Products Anaphylaxis     Weight:   Wt Readings from Last 4 Encounters:   08/20/18 67.8 kg (149 lb  7.6 oz)   12/29/16 65.8 kg (145 lb)   09/13/12 62.6 kg (138 lb)   02/08/11 61 kg (134 lb 6.4 oz)     Physical Exam:  Vitals:    08/21/18 0800   BP: 146/65   Pulse: 86   Resp: (!) 33   Temp: 97.8 F (36.6 C)   SpO2: 98%      General: well developed female, NAD  Eyes: No xanthelasma.  Oropharynx: No mucosal pallor.  Neck: Trachea midline, unable to assess JVD (R IJ)  Pulmonary: Normal respiratory effort, clear lung sounds bilaterally.  Cardiovascular: RRR, nMRG  Abdomen: Soft  Extremities: No edema, no clubbing, no cyanosis.  Warm and  perfused  Skin: No tendinous xanthomas    ECG:   Previous:  NSR, LBBB in 2018  Today:  NSR, LBBB (no Sgarbosa's criteria)    Previous cardiac testing (08/19/18): mildly dilated LV, EF 21%, mild MR and TR.  Compared to previous study dated 12/2015 there has been a decline in EF from 40% to 21%    Laboratory:    Recent Labs     08/21/18  0308 08/21/18  0118 08/20/18  0308 08/19/18  0522 08/18/18  2257 08/18/18  1529   Sodium 142 142 138 136 138 136   Potassium 3.6 3.5 3.9 4.7 4.0 3.9   Chloride 111 111 110 109 110 108   CO2 22 23 19* 18* 20* 18*   BUN 23.0* 23.0* 24.0* 22.0* 20.0* 21.0*   Calcium 8.3 8.3 8.2 8.5 8.0 8.2   Calcium, Ionized  --   --   --  2.54 2.49 2.44   Magnesium 1.9  --   --   --   --   --    Phosphorus  --   --   --  3.9 3.2 3.2     Recent Labs     08/21/18  0308 08/21/18  0118 08/20/18  0308   WBC 11.49* 11.46* 12.07*   Hgb 8.7* 8.7* 9.1*   Hematocrit 26.2* 26.5* 27.4*   Platelets 182 176 155     Recent Labs     08/18/18  2257 08/18/18  1533   PT  --  13.8   PT INR  --  1.1   PTT 34 28          Signed by: Samule Ohm, MD   Date/Time: 08/21/18 9:47 AM

## 2018-08-21 NOTE — Plan of Care (Signed)
Neuro: Oriented to person and place- despite frequent reorientation and suggestion, pt continuously trying to get up OOB and requesting to go home. Sitter at bedside to help with reorientation    Resp: Remains on 2 L NC with sats >95%- breath sounds diminished in bases with weak nonproductive cough- failed attempt at RA x 3 with sats in the 80S      Card: Sinus rhythm with rates in the 90s- SBP 140-160s- received hydralizine 5mg  IV x 1  with SBP decreased to 150. Pt with c/o midsternal chest pain- troponins slighly elevated and pt to cardiac cath lab secondary to admitting  dx of cardiac arrest. Right raiadl insertion site clean, dry and intact    GI: Failed swallow study this am- will recheck in 6/11. Pt with positive bowel sounds and passing gas    GU: Foley catheter d/ced- has yet to void    No safety issues

## 2018-08-21 NOTE — H&P (Addendum)
H&P UPDATE WITH ASA/MALLAMPATTI    Date Time: 08/21/18 11:13 AM    PROCEDURE:    Left heart cath, possible percutaneous coronary intervention  INDICATIONS:    Cardiac arrest, cardiomyopathy    73 yo F DM, Obesity, CKD, Anemia, prior NICM, LBBB ECG, possible IV contrast allergy (to be pre-medicated in Cath lab), admit VF arrest with CPR/ROSC and follow-on severe LV dysfunction by Echo, now referred for LHC, Cor Angio, possible PCI.    H&P:    The history and physical including past medical, family, and social history were reviewed   and there are no significant interval changes from what is currently available in the chart  from prior evaluation. She has no complaints.  She was seen and examined by me prior   to the procedure.   ALLERGIES:    Shellfish-derived products   LABS:      Lab Results   Component Value Date    WBC 11.49 (H) 08/21/2018    HGB 8.7 (L) 08/21/2018    HCT 26.2 (L) 08/21/2018    PLT 182 08/21/2018    NA 142 08/21/2018    K 3.6 08/21/2018    CL 111 08/21/2018    CO2 22 08/21/2018    MG 1.9 08/21/2018    BUN 23.0 (H) 08/21/2018    CREAT 1.1 (H) 08/21/2018    EGFR 48.6 08/21/2018    GLU 132 (H) 08/21/2018     ASA PHYSICAL STATUS    Class 3 - Severe systemic disease, limits normal activity but not incapacitating  MALLAMPATTI AIRWAY CLASSIFICATION    Class III: Soft and hard palate and base of the uvula are visible  ACC BLEEDING RISK SCORE   Total Score: 75 = HIGH RISK for bleeding (greater than 3.1%)  PLANNED SEDATION:    ( ) NO SEDATION   (x) MODERATE SEDATION   ( ) DEEP SEDATION WITH ANESTHESIA   CONCLUSION:    The risks, benefits and alternatives of the procedure have been discussed in detail and   she has indicated that she understands the procedure, indications, and risks inherent to the   procedure and is amenable to proceeding.  All questions were answered. Informed   consent was signed and verified.      Signed by: Burna Forts, MD

## 2018-08-22 ENCOUNTER — Encounter: Payer: Self-pay | Admitting: Cardiovascular Disease

## 2018-08-22 LAB — HEPATIC FUNCTION PANEL
ALT: 34 U/L (ref 0–55)
AST (SGOT): 22 U/L (ref 5–34)
Albumin/Globulin Ratio: 1 (ref 0.9–2.2)
Albumin: 2.8 g/dL — ABNORMAL LOW (ref 3.5–5.0)
Alkaline Phosphatase: 95 U/L (ref 37–106)
Bilirubin Direct: 0.3 mg/dL (ref 0.0–0.5)
Bilirubin Indirect: 0.2 mg/dL (ref 0.2–1.0)
Bilirubin, Total: 0.5 mg/dL (ref 0.2–1.2)
Globulin: 2.7 g/dL (ref 2.0–3.6)
Protein, Total: 5.5 g/dL — ABNORMAL LOW (ref 6.0–8.3)

## 2018-08-22 LAB — GLUCOSE WHOLE BLOOD - POCT
Whole Blood Glucose POCT: 168 mg/dL — ABNORMAL HIGH (ref 70–100)
Whole Blood Glucose POCT: 190 mg/dL — ABNORMAL HIGH (ref 70–100)
Whole Blood Glucose POCT: 209 mg/dL — ABNORMAL HIGH (ref 70–100)
Whole Blood Glucose POCT: 242 mg/dL — ABNORMAL HIGH (ref 70–100)

## 2018-08-22 LAB — URINALYSIS, REFLEX TO MICROSCOPIC EXAM IF INDICATED
Bilirubin, UA: NEGATIVE
Blood, UA: NEGATIVE
Glucose, UA: >=500 — AB
Ketones UA: >=80 — AB
Leukocyte Esterase, UA: NEGATIVE
Nitrite, UA: NEGATIVE
Protein, UR: 30 — AB
Specific Gravity UA: 1.022 (ref 1.001–1.035)
Urine pH: 5 (ref 5.0–8.0)
Urobilinogen, UA: NORMAL mg/dL (ref 0.2–2.0)

## 2018-08-22 LAB — IRON PROFILE
Iron Saturation: 24 % (ref 15–50)
Iron: 55 ug/dL (ref 40–145)
TIBC: 225 ug/dL — ABNORMAL LOW (ref 265–497)
UIBC: 170 ug/dL (ref 126–382)

## 2018-08-22 LAB — CBC WITH MANUAL DIFFERENTIAL
Absolute NRBC: 0 10*3/uL (ref 0.00–0.00)
Band Neutrophils Absolute: 0 10*3/uL (ref 0.00–1.00)
Band Neutrophils: 0 %
Basophils Absolute Manual: 0 10*3/uL (ref 0.00–0.08)
Basophils Manual: 0 %
Cell Morphology: NORMAL
Eosinophils Absolute Manual: 0.09 10*3/uL (ref 0.00–0.44)
Eosinophils Manual: 1 %
Hematocrit: 27.2 % — ABNORMAL LOW (ref 34.7–43.7)
Hgb: 9.3 g/dL — ABNORMAL LOW (ref 11.4–14.8)
Lymphocytes Absolute Manual: 0.94 10*3/uL (ref 0.42–3.22)
Lymphocytes Manual: 10 %
MCH: 30.6 pg (ref 25.1–33.5)
MCHC: 34.2 g/dL (ref 31.5–35.8)
MCV: 89.5 fL (ref 78.0–96.0)
MPV: 9.7 fL (ref 8.9–12.5)
Monocytes Absolute: 0.66 10*3/uL (ref 0.21–0.85)
Monocytes Manual: 7 %
Neutrophils Absolute Manual: 7.7 10*3/uL — ABNORMAL HIGH (ref 1.10–6.33)
Nucleated RBC: 0 /100 WBC (ref 0.0–0.0)
Platelet Estimate: NORMAL
Platelets: 216 10*3/uL (ref 142–346)
RBC: 3.04 10*6/uL — ABNORMAL LOW (ref 3.90–5.10)
RDW: 13 % (ref 11–15)
Segmented Neutrophils: 82 %
WBC: 9.39 10*3/uL (ref 3.10–9.50)

## 2018-08-22 LAB — HEMOLYSIS INDEX: Hemolysis Index: 20 — ABNORMAL HIGH (ref 0–18)

## 2018-08-22 LAB — BASIC METABOLIC PANEL
BUN: 34 mg/dL — ABNORMAL HIGH (ref 7.0–19.0)
CO2: 18 mEq/L — ABNORMAL LOW (ref 22–29)
Calcium: 8 mg/dL (ref 7.9–10.2)
Chloride: 114 mEq/L — ABNORMAL HIGH (ref 100–111)
Creatinine: 1.3 mg/dL — ABNORMAL HIGH (ref 0.6–1.0)
Glucose: 165 mg/dL — ABNORMAL HIGH (ref 70–100)
Potassium: 3.6 mEq/L (ref 3.5–5.1)
Sodium: 142 mEq/L (ref 136–145)

## 2018-08-22 LAB — STOOL FOR WBC
Eosinophils Wright Stain: NONE SEEN
Lymphocytes Wright Stain: NONE SEEN
Neutrophils Wright Stain: NONE SEEN
RBC Wright Stain: NONE SEEN

## 2018-08-22 LAB — VITAMIN B12: Vitamin B-12: 804 pg/mL (ref 211–911)

## 2018-08-22 LAB — FOLATE: Folate: 15.2 ng/mL

## 2018-08-22 LAB — FERRITIN: Ferritin: 277.27 ng/mL — ABNORMAL HIGH (ref 4.60–204.00)

## 2018-08-22 LAB — GFR: EGFR: 40

## 2018-08-22 MED ORDER — HYDRALAZINE HCL 20 MG/ML IJ SOLN
10.00 mg | Freq: Four times a day (QID) | INTRAMUSCULAR | Status: DC | PRN
Start: 2018-08-22 — End: 2018-08-27
  Administered 2018-08-23: 09:00:00 10 mg via INTRAVENOUS
  Filled 2018-08-22: qty 1

## 2018-08-22 NOTE — SLP Progress Note (Signed)
[facility]   SLP Treatment Note  [name]    MRN#: [id number]     Treatment Type: dysphagia    Recommendations/Plan:   - Diet Recommendations: NPO; consider short-term alternate nutrition source  - Medication Route: non-oral route  - Aspiration Precautions: oral care, oral suction prn, elevate HOB >30  -Continue SLP services for dysphagia     SLP Frequency Recommended: 4-5x/wk    Discharge recommendations: Acute Rehab, SNF    Assessment:   Suspect at least moderate oropharyngeal dysphagia. Oral phase c/b reduced bolus acceptance, labial seal, bolus control, and AP transit. Pharyngeal phase c/b diminished and delayed hyolaryngeal excursion on palpation, multiple swallows (2-4 per bolus) concerning for pharyngeal residue, and s/s aspiration with conservative trials of ice, thin, nectar, and puree. Mildly reduced coordination of respiration and swallow. Patient is alert, able to sit upright, managing her secretions, and dysphonic.    Recommend strict NPO, consider short-term alternate nutrition source. SLP to follow for dysphagia.     Subjective:   Pain: Denies  Current Diet: NPO, no access  Respiratory Status: RA  Precautions: fall and aspiration  Interpreter services: N/A  Intubated 6/5-6/9     RN cleared patient for session; reports patient is managing secretions, able to sit upright, and alert. Patient is alert and agreeable to session. No  family at bedside.     Objective:   Oral care/assesment: moist and pink oropharynx; moderate-severe dysphonia with reduced intensity and +hoarseness; reduced lingual-labial ROM/strength/coordination  PO trials provided: ice chips x4, thin tsp x1, nectar tsp x3, puree 1/2 tsp x2  Oral phase: reduced bolus acceptance, reduced lip seal, reduced oral control, prolonged a/p transit, no oral residue   Pharyngeal phase (on palpation): delayed swallow initiation, diminished hyolaryngeal elevation, multiple swallows (2-4 per bolus with nectar/puree, 1-2 with  thin)  S/s aspiration: immediate weak cough after 4th ice chip, after  thin tsp, after 2nd nectar tsp, and after both puree trials  Vitals: SPO2 99% throughout; RR fluctuates; increased DOE requiring slow pace and rest breaks to maintain adequate coordination of respiration and swallow.        Patient Education: verbal/demo; patient is confused and unable to demonstrate understanding.    Patient left with call bell within reach, all needs met, SCDs in place, fall mat in place, bed alarm   activated and all questions answered. RN notified of session outcome and patient response.    Goals:   1. Patient will tolerate 5/5 ice chips trials without s/s aspiration. ONGOING  2. Patient will tolerate 5/5 liquid trials without s/s aspiration. ONGOING      Mickeal Skinner MS, CCC-SLP  08/22/2018        Time of Treatment:  SLP Received On: 08/22/18  Start Time: 0825  Stop Time: 0850  Time Calculation (min): 25 min

## 2018-08-22 NOTE — Plan of Care (Signed)
Intermittently confused, however pt is calm/cooperative. Overall, delirium/confusion improving. Sitter continued for safety. Weak voice and cough. NPO, need to repeat swallow eval. Voided after foley removal, multiple BMs overnight.  No other acute changes.

## 2018-08-22 NOTE — Plan of Care (Signed)
Problem: Moderate/High Fall Risk Score >5  Goal: Patient will remain free of falls  Outcome: Progressing  Flowsheets (Taken 08/22/2018 1853)  VH High Risk (Greater than 13):   Use of floor mat   Use chair-pad alarm device   Keep door open for better visibility  Note: Patient has a Comptroller. No falls or injuries to present this shift.     Problem: Safety  Goal: Patient will be free from injury during hospitalization  Outcome: Progressing  Flowsheets (Taken 08/22/2018 1853)  Patient will be free from injury during hospitalization:   Assess for patients risk for elopement and implement Elopement Risk Plan per policy   Use appropriate transfer methods   Hourly rounding   Assess patient's risk for falls and implement fall prevention plan of care per policy  Goal: Patient will be free from infection during hospitalization  Outcome: Progressing  Flowsheets (Taken 08/22/2018 1853)  Free from Infection during hospitalization:   Assess and monitor for signs and symptoms of infection   Monitor all insertion sites (i.e. indwelling lines, tubes, urinary catheters, and drains)   Monitor lab/diagnostic results   Encourage patient and family to use good hand hygiene technique     Problem: Pain  Goal: Pain at adequate level as identified by patient  Outcome: Progressing  Flowsheets (Taken 08/22/2018 1853)  Pain at adequate level as identified by patient:   Identify patient comfort function goal   Assess pain on admission, during daily assessment and/or before any "as needed" intervention(s)   Offer non-pharmacological pain management interventions     Problem: Side Effects from Pain Analgesia  Goal: Patient will experience minimal side effects of analgesic therapy  Outcome: Progressing     Problem: Discharge Barriers  Goal: Patient will be discharged home or other facility with appropriate resources  Outcome: Progressing     Problem: Psychosocial and Spiritual Needs  Goal: Demonstrates ability to cope with  hospitalization/illness  Outcome: Progressing  Flowsheets (Taken 08/22/2018 1853)  Demonstrates ability to cope with hospitalizations/illness:   Encourage verbalization of feelings/concerns/expectations   Provide quiet environment   Reinforce positive adaptation of new coping behaviors   Encourage participation in diversional activity   Encourage patient to set small goals for self     Problem: Compromised Tissue integrity  Goal: Damaged tissue is healing and protected  Outcome: Progressing  Flowsheets (Taken 08/22/2018 1853)  Damaged tissue is healing and protected:   Use bath wipes, not soap and water, for daily bathing   Provide wound care per wound care algorithm   Relieve pressure to bony prominences for patients at moderate and high risk   Reposition patient every 2 hours and as needed unless able to reposition self   Avoid shearing injuries   Monitor/assess Braden scale every shift   Increase activity as tolerated/progressive mobility   Keep intact skin clean and dry   Use incontinence wipes for cleaning urine, stool and caustic drainage. Foley care as needed   Monitor external devices/tubes for correct placement to prevent pressure, friction and shearing   Encourage use of lotion/moisturizer on skin  Goal: Nutritional status is improving  Outcome: Progressing  Flowsheets (Taken 08/22/2018 1853)  Nutritional status is improving: Collaborate with Clinical Nutritionist  Note: Repeat bedside swallow evaluation in the am.     Problem: Hemodynamic Status: Cardiac  Goal: Stable vital signs and fluid balance  Outcome: Progressing  Flowsheets (Taken 08/22/2018 1853)  Stable vital signs and fluid balance:   Monitor for leg swelling/edema and report to  LIP if abnormal   Weigh on admission and record weight daily   Assess signs and symptoms associated with cardiac rhythm changes   Monitor/assess vital signs and telemetry per unit protocol   Monitor intake/output per unit protocol and/or LIP order   Monitor lab values      Problem: Inadequate Tissue Perfusion  Goal: Adequate tissue perfusion will be maintained  Outcome: Progressing  Flowsheets (Taken 08/22/2018 1853)  Adequate tissue perfusion will be maintained:   Monitor/assess vital signs   Monitor/assess lab values and report abnormal values   Monitor/assess neurovascular status (pulses, capillary refill, pain, paresthesia, paralysis, presence of edema)   Monitor/assess for signs of VTE (edema of calf/thigh redness, pain)   Monitor for signs and symptoms of a pulmonary embolism (dyspnea, tachypnea, tachycardia, confusion)   VTE Prevention: Administer anticoagulant(s) and/or apply anti-embolism stockings/devices as ordered   Encourage/assist patient as needed to turn, cough, and perform deep breathing every 2 hours   Increase mobility as tolerated/progressive mobility   Elevate feet   Position patient for maximum circulation/cardiac output     Problem: Ineffective Gas Exchange  Goal: Effective breathing pattern  Outcome: Progressing  Flowsheets (Taken 08/22/2018 1853)  Effective breathing pattern: Maintain O2 saturation level per LIP order     Problem: Nutrition  Goal: Nutritional intake is adequate  Outcome: Progressing  Flowsheets (Taken 08/22/2018 1853)  Nutritional intake is adequate:   Monitor daily weights   Encourage/perform oral hygiene as appropriate     Problem: Impaired Mobility  Goal: Mobility/Activity is maintained at optimal level for patient  Outcome: Progressing  Flowsheets (Taken 08/22/2018 1853)  Mobility/activity is maintained at optimal level for patient:   Increase mobility as tolerated/progressive mobility   Encourage independent activity per ability   Maintain proper body alignment   Perform active/passive ROM   Plan activities to conserve energy, plan rest periods   Reposition patient every 2 hours and as needed unless able to reposition self   Assess for changes in respiratory status, level of consciousness and/or development of fatigue   Consult/collaborate  with Physical Therapy and/or Occupational Therapy

## 2018-08-22 NOTE — Progress Notes (Signed)
CCU Cardiology Consult Daily Note  Patient's Name: Tara Perry   Attending Provider: Sharyn Lull, MD  Admit Date:08/16/2018  Medical Record Number: [id number]   Room:  FI111/FI111-01  Date/Time: 08/22/18 1:14 PM   Cardiology: [facility]    Impression/Recommendations-  Mrs. Tara Perry is a 73 year old female with a history of heart failure with reduced ejection fraction with transient recovery who presented with ventricular fibrillation.  She had a witnessed cardiac arrest with immediate CPR.  ROSC was obtained after 3 cardioversions and a single dose of epinephrine.  She was started on hypothermic protocol with goal temperature reached to 815 on August 17, 2018.  A repeat echocardiogram demonstrated an EF of 20%.  She was extubated on 08/20/2018 and remains hemodynamically stable. Mild chest pain with uptrending troponin today.     #1. VF arrest  - holding anti-arrhythmics per EP  - will need a CRT-D prior to discharge  - negative ischemic evaluation  - she has failed a swallow evaluation twice, will place corepak and start low dose coreg 3.125 mg twice daily  - removed IJ today    #2. Non-ischemic cardiomyopathy  - start coreg 3.125 mg BID today and valsartan tomorrow once she is able to swallow or has corepak in place  - not significantly overloaded on exam , PRN diuresis  - will need CRT-D prior to discharge    #3. Pulmonary edema  - resolved  - will need diuresis PRN     #4: Elevated troponin: demand ischemia, LHC showed no obstructive CAD      M Raynald Blend MD  CICU Cardiology Attending  ---------------------------------------------------------    73 year old female with history of diabetes and post viral cardiomyopathy, who suffered a witnessed cardiac arrest at home secondary to ventricular dysrhythmia and achieved ROSC post CPR and defibrillationX3. 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    Overnight: Remained hemodynamically stable, underwent LHC which showed no obstructive CAD, continues to improve neurologically, failed swallow evaluation x 2    Review of systems:  No constitutional complaints, no respiratory complaints, no musculoskeletal complaints, or other pertinent review of symptoms except as noted above.    Current medications:  Current Facility-Administered Medications   Medication Dose Route Frequency    atorvastatin  10 mg Oral Daily    heparin (porcine)  5,000 Units Subcutaneous Q12H SCH    insulin glargine  7 Units Subcutaneous QAM    insulin regular  1-8 Units Subcutaneous 4 times per day    lidocaine  2 patch Transdermal Q24H     Allergies:  Allergies   Allergen Reactions    Shellfish-Derived Products Anaphylaxis     Weight:   Wt Readings from Last 4 Encounters:   08/22/18 67.5 kg (148 lb 13 oz)   12/29/16 65.8 kg (145 lb)   09/13/12 62.6 kg (138 lb)   02/08/11 61 kg (134 lb 6.4 oz)     Physical Exam:  Vitals:    08/22/18 1200   BP: 169/81   Pulse: 89   Resp: (!) 27   Temp: 97.5 F (36.4 C)   SpO2: 94%      General: well developed female, NAD  Eyes: No xanthelasma.  Oropharynx: No mucosal pallor.  Neck: Trachea midline, JVD 10 cm  Pulmonary: Normal respiratory effort, clear lung sounds bilaterally.  Cardiovascular: RRR, nMRG  Abdomen: Soft  Extremities: No edema,  no clubbing, no cyanosis.  Warm and perfused  Skin: No tendinous xanthomas    ECG:   Previous:  NSR, LBBB in 2018  Today:  NSR, LBBB (no Sgarbosa's criteria)    Previous cardiac testing   #1. Echocardiogram (08/19/18): mildly dilated LV, EF 21%, mild MR and TR.  Compared to previous study dated 12/2015 there has been a decline in EF from 40% to 21%  #2. Left heart catheterization (08/22/18): no obstructive CAD    Laboratory:    Recent Labs     08/22/18  0431 08/21/18  0308 08/21/18  0118   Sodium 142 142 142   Potassium 3.6 3.6 3.5   Chloride 114* 111 111   CO2 18* 22 23   BUN 34.0* 23.0* 23.0*   Calcium 8.0  8.3 8.3   Magnesium  --  1.9  --      Recent Labs     08/22/18  0431 08/21/18  0308 08/21/18  0118   WBC 9.39 11.49* 11.46*   Hgb 9.3* 8.7* 8.7*   Hematocrit 27.2* 26.2* 26.5*   Platelets 216 182 176     No results for input(s): PT, INR, PTT in the last 72 hours.       Signed by: Dianna Rossetti, MD   Date/Time: 08/22/18 1:14 PM

## 2018-08-22 NOTE — Progress Notes (Signed)
ICU/CCU Daily Progress Note    Patient's Name: Tara Perry    Room:  FI111/FI111-01  Attending Provider: Sharyn Lull, MD  Admit Date:08/16/2018  Medical Record Number: [id number]     Date/Time: 08/22/18 1:59 PM      Ms Winbush is a 73 y/o woman with PMHx of DMII and post viral cardiomyopathy who underwent a witnessed cardiac arrest at home secondary to ventricular dysrhythmia and achieved ROSC post CPR and defibrillationX3.     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 She was intubated for airway protection, and upgraded to CICU for hypothermia protocol.    She completed the hypothermia protocol on 6/8 with successful rewarming. She demonstrated improvement in her neurological function, and was extubated on 6/9. On  6/10, she developed chest pain with slight ECG changes and troponin leak c/w Type II NSTEMI given LHC demonstrated no CAD. The chest pain has since resolved. Will proceed with medical downgrade with BiV ICD before discharge.       24hr events:  NAEON  Failed speech evaluation today  Awaiting medicine bed    Spoke to patient at bedside. She states "she feels bad but better." She denies chest pain, SOB, nausea, vomiting, dizziness, fever, or chills.      Patient Active Problem List   Diagnosis    Peritonitis    Viral cardiomyopathy    Type II diabetes mellitus without mention of complication    Gout synovitis    Pulmonary edema    Type 2 diabetes mellitus without complications    SIRS (systemic inflammatory response syndrome)    Viral cardiomyopathy    Dyspnea    CHF (congestive heart failure)    Cardiac arrest       VITAL SIGNS PHYSICAL EXAM   Temp:  [97.3 F (36.3 C)-98.4 F (36.9 C)] 97.5 F (36.4 C)  Heart Rate:  [80-100] 89  Resp Rate:  [21-40] 27  BP: (148-169)/(61-81) 169/81  Vent Settings  Vent Mode: PS/CPAP  FiO2: 40 %  Resp Rate (Set): 12  Vt (Set, mL): 440 mL  PIP Observed (cm H2O): 12 cm H2O  PEEP/EPAP: 6 cm  H20  Pressure Support / IPAP: 6 cmH20  Mean Airway Pressure: 7 cmH20    Intake/Output Summary (Last 24 hours) at 08/22/2018 1359  Last data filed at 08/21/2018 2233  Gross per 24 hour   Intake 100 ml   Output 160 ml   Net -60 ml    Physical Exam  Gen: Patient resting comfortably. AAO x3. Follows commands.   HEENT: Moist oral mucosa. PERRLA. No scleral icterus  Neck: No LAD  Cardiac: S1 and S2 audible and regular. No murmurs or rubs present  Lungs: CTA x2  Abdomen: + BS in all four quadrants. NT/ND. No rigidity or guarding   Ext: Peripheral pulses +2. No edema or cyanosis present  Neuro: Answers questions appropriately, voice stronger, UE muscle strength 4/5. No facial droop  Skin: No petechiae, UE ecchymoses, warm and dry          Scheduled Meds: PRN Meds:    atorvastatin, 10 mg, Oral, Daily  heparin (porcine), 5,000 Units, Subcutaneous, Q12H SCH  insulin glargine, 7 Units, Subcutaneous, QAM  insulin regular, 1-8 Units, Subcutaneous, 4 times per day  lidocaine, 2 patch, Transdermal, Q24H        Continuous Infusions:   acetaminophen, 650 mg, Q6H PRN  dextrose, 15 g of glucose, PRN    And  dextrose, 12.5 g, PRN    And  glucagon (rDNA), 1 mg, PRN  hydrALAZINE, 5 mg, Q6H PRN            Labs (last 72 hours):  Recent Labs     08/22/18  0431 08/21/18  0308 08/21/18  0118   WBC 9.39 11.49* 11.46*   Hgb 9.3* 8.7* 8.7*   Hematocrit 27.2* 26.2* 26.5*     No results for input(s): PT, INR, PTT in the last 72 hours. Recent Labs     08/22/18  0431 08/21/18  0308 08/21/18  0118   Sodium 142 142 142   Potassium 3.6 3.6 3.5   Chloride 114* 111 111   CO2 18* 22 23   BUN 34.0* 23.0* 23.0*   Creatinine 1.3* 1.1* 1.1*   Glucose 165* 132* 127*   Calcium 8.0 8.3 8.3   Magnesium  --  1.9  --                  Microbiology:   Microbiology Results     Procedure Component Value Units Date/Time    Blood Culture Aerobic/Anaerobic #1 [[id number]] Collected:  08/17/18 0105    Specimen:  Arm from Blood, Venipuncture Updated:  08/22/18 0621     Narrative:       ORDER#: [id number]                                    ORDERED BY: Effie Shy, PATRIC  SOURCE: Blood, Venipuncture steripath                COLLECTED:  08/17/18 01:05  ANTIBIOTICS AT COLL.:                                RECEIVED :  08/17/18 03:28  Culture Blood Aerobic and Anaerobic        FINAL       08/22/18 06:21  08/22/18   No growth after 5 days of incubation.      Blood Culture Aerobic/Anaerobic #2 [[id number]] Collected:  08/17/18 0216    Specimen:  Arm from Blood, Venipuncture Updated:  08/22/18 0621    Narrative:       ORDER#: [id number]                                    ORDERED BY: Effie Shy, PATRIC  SOURCE: Blood, Venipuncture steripath  L Ac          COLLECTED:  08/17/18 02:16  ANTIBIOTICS AT COLL.:                                RECEIVED :  08/17/18 03:28  Culture Blood Aerobic and Anaerobic        FINAL       08/22/18 06:21  08/22/18   No growth after 5 days of incubation.      COVID-19 (SARS-CoV-2) [facility] Rapid) [[id number]] Collected:  08/17/18 0157    Specimen:  Nasopharyngeal Swab Updated:  08/17/18 0323     Purpose of COVID testing Screening     SARS-CoV-2 Specimen Source Nasopharyngeal     SARS CoV 2 Overall Result Negative     Comment: Test performed using the Abbott ID NOW EUA assay.  Please see Fact Sheets for patients and providers located at:  http://www.rice.biz/  This test is for the qualitative detection of SARS-CoV-2  (COVID-19)nucleic acid. Viral nucleic acids may persist in vivo,  independent of viability. Detection of viral nucleic acid does  not imply the presence of infectious virus, or that virus  nucleic acid is the cause of clinical symptoms.  Negative results do not preclude SARS-CoV-2 infection and  should not be used as the sole basis for patient management  decisions.  Invalid results may be due to inhibiting substances in the  specimen and recollection should occur.         Narrative:       o Collect and clearly label specimen type:  o Upper  respiratory specimen: One Nasopharyngeal Dry Swab NO  Transport Media.  o Hand deliver to laboratory ASAP    CULTURE BLOOD AEROBIC AND ANAEROBIC [[id number]] Collected:  08/19/18 1505    Specimen:  Arterial Blood Updated:  08/21/18 1921    Narrative:       The order will result in two separate 8-68ml bottles  Please do NOT order repeat blood cultures if one has been  drawn within the last 48 hours.  AVOID BLOOD CULTURE DRAWS FROM CENTRAL LINE IF POSSIBLE  Indications:->Fever of greater than 101.5  ORDER#: [id number]                                    ORDERED BY: Gorden Harms  SOURCE: Arterial Blood                               COLLECTED:  08/19/18 15:05  ANTIBIOTICS AT COLL.:                                RECEIVED :  08/19/18 18:45  Culture Blood Aerobic and Anaerobic        PRELIM      08/21/18 19:21  08/20/18   No Growth after 1 day/s of incubation.  08/21/18   No Growth after 2 day/s of incubation.      CULTURE BLOOD AEROBIC AND ANAEROBIC [[id number]] Collected:  08/19/18 1505    Specimen:  Blood, Venipuncture Updated:  08/21/18 1921    Narrative:       The order will result in two separate 8-62ml bottles  Please do NOT order repeat blood cultures if one has been  drawn within the last 48 hours.  AVOID BLOOD CULTURE DRAWS FROM CENTRAL LINE IF POSSIBLE  Indications:->Fever of greater than 101.5  ORDER#: [id number]                                    ORDERED BY: Gorden Harms  SOURCE: Blood, Venipuncture                          COLLECTED:  08/19/18 15:05  ANTIBIOTICS AT COLL.:                                RECEIVED :  08/19/18 18:45  Culture Blood Aerobic and Anaerobic        PRELIM  08/21/18 19:21  08/20/18   No Growth after 1 day/s of incubation.  08/21/18   No Growth after 2 day/s of incubation.      MRSA culture - Nares (If not done in triage) [[id number]] Collected:  08/17/18 0621    Specimen:  Culturette from Nares Updated:  08/18/18 0255     Culture MRSA Surveillance Negative for Methicillin Resistant  Staph aureus    MRSA culture - Throat (If not done in triage) [[id number]] Collected:  08/17/18 0621    Specimen:  Culturette from Throat Updated:  08/18/18 0255     Culture MRSA Surveillance Negative for Methicillin Resistant Staph aureus              Imaging:  Ct Head Wo Contrast    Result Date: 08/17/2018  No acute intracranial abnormality. Eloise Harman, MD  08/17/2018 12:36 AM    Xr Chest Ap Portable    Result Date: 08/19/2018  Lines and tubes as described without pneumothorax. Bibasilar atelectasis without focal consolidation identified. Colonel Bald, MD  08/19/2018 12:35 PM    Xr Chest Ap Portable    Result Date: 08/17/2018   Right IJ central venous catheter has been pulled back with tip now at the cavoatrial junction. Shelly Flatten, MD  08/17/2018 6:29 PM    Xr Chest Ap Portable    Result Date: 08/17/2018   1. Right IJ central venous catheter placement with tip in the right atrium. 2. Retrocardiac opacity may represent atelectasis or effusion. Shelly Flatten, MD  08/17/2018 5:28 PM    Chest Ap Portable    Result Date: 08/17/2018   1. ET tube tip 3.2 cm above the carina and gastric drainage tube sidehole in the stomach and tip below the field-of-view. 2. Central vascular congestion, likely upper lobe edema, and bibasilar atelectasis. Infection/aspiration cannot be entirely excluded. 3. Borderline enlarged cardiopericardial silhouette. Eloise Harman, MD  08/17/2018 12:11 AM        Assessment and Plan:  Ms Staiger is a 73 y/o woman with PMHx of DMII and post viral cardiomyopathy who underwent a witnessed cardiac arrest at home secondary to ventricular dysrhythmia and achieved ROSC post CPR and defibrillationX3.     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 She was intubated for airway protection, and upgraded to CICU for hypothermia protocol.    She completed the hypothermia protocol on 6/8 with successful rewarming. She demonstrated improvement in her neurological  function, and was extubated on 6/9. Overnight, she developed chest pain with slight ECG changes and slight troponin leak c/w Type II NSTEMI given LHC this morning demonstrated no CAD. The chest pain has since resolved. Will proceed with medical downgrade with BiV ICD before discharge.        NEURO:  #Pain from CPR  - Patient is now s/p rewarming phase. Now off propofol since 06/08.  - S/p Precedex  - Continue Lidocaine patches and PRN Tylenol 650 mg Q6H      #Delirium  - S/p TTM with rewarming and off sedation  - CT head without any acute intracranial abnormality  - Per documentation, appears to have improved  Continue delirium precuations      CARDIO:  #VFib Arrest s/p ROSC  ROSC achieved after 1mg  of Epi and 3 shocks (~10 minutes)   Likely in the setting of viral cardiomyopathy, EF 40% with LBBB. Low suspicion for PE. EKG without evidence of acute MI.   - s/p TTM protocol  -  Continuous telemetry monitoring  -LHC 6/10 with clean coronaries  - EP consulted, appreciate recs. Will plan for CRT-D placement prior to d/c      #NICM / HFrEF  -EF40% in 2017  -TTE 06/08 with decreased EF to 21%, LV dilation and multiple wall motion abnormalities.  -Patient failed SLP. Will start carvedilol 3.125 mg BID today, and valsartan 40 mg PO daily tomorrow once Corepak is in place  -In the interim will use Hydralazine 5 mg IV PRN Q6H for systolics > 160      #Transient Cardiogenic Vasopressor Requirement - resolved      PULMONARY:  #Acute Hypoxic Respiratory Failure in the setting of Cardiac Arrest - resolving#  - Extubated on 6/9  - Now on 2L  -Incentive spirometry and PT/OT      RENAL:  #AKI on CKD Stage III - at baseline Cr 1-1.3#    GI:  #Transaminitis - resolved    #Loose Bowel Movements  -No fever of abdominal cramping to suggest an infectious etiology  -Follow fecal leukocytes      Nutrition:   -NPO due to residual swallowing difficulty from cardiac arrest.   -SLP consulted, appreciate recs  -Patient failed speech  x2  -Will place Core-pak    ID:  #Sirs - resolved#  #Concern for PNA#   - On 06/09 with increasing leukocytosis and no fever suspected due to cooling blanket.   -CXR on 6/8 with trace bibasilar opacities  -No fever in 48 hours, or tachycardia or hypotension  - BCx 06/08: NGTD x2 .BCx 06/06: NGD5 MRSA negative.  -S/p Vanc 1.5 mg x1  -On Zosyn 4.5 g Q8H (6/8-6/10)  -D/c antibiotics            Hem/Onc:  #Reative leukocytosis-resolved      #Normocytic normochromia anemia - improving  - No evidence of active bleeding, suspect in setting of phlebotomy with inappropriate bone marrow response  -Retic index of 0.74 c/w hypo-proliferative disorder  -Normal haptoglobin and potassium argues against hemolysis  -Iron replete, B12 and folate replete      Endo:  #T2DM  - Continue Lantus to 7 units daily while NPO, 14 U when eating  -Continue SSI Regular, will d/c once eating or corepak in place      Prophylaxis:  - VTE:  SubQ Heparin  - PPI:  D/c  - BM: Holding given soft bowel movements    Spoke with daughter, and provided updates.    Code Status: Full    Lines/Drains/Airways:    Patient Lines/Drains/Airways Status    Active Lines, Drains and Airways     Name:   Placement date:   Placement time:   Site:   Days:    Peripheral IV 08/17/18 Left Forearm   08/17/18    --    Forearm   5         Inactive Lines, Drains and Airways     Name:   Placement date:   Placement time:   Removal date:   Removal time:   Site:   Days:    [REMOVED] CVC Triple Lumen 08/17/18 Right Internal jugular   08/17/18    1800    08/22/18    1125    Internal jugular   4    [REMOVED] Peripheral IV 08/16/18 Right Antecubital   08/16/18    2341    08/19/18    1223    Antecubital   2    [REMOVED] Peripheral IV 08/16/18 Right Forearm  08/16/18    2351    08/19/18    1714    Forearm   2    [REMOVED] Urethral Catheter Non-latex 16 Fr.   08/17/18    0003    08/21/18    1500    Non-latex   4    [REMOVED] ETT  7.5 mm   08/16/18    2342    08/20/18    1523     3     [REMOVED] Intraosseous Line 08/17/18 Tibia   08/17/18    --    08/17/18    0630    Anterior;Right   less than 1    [REMOVED] Peripheral Arterial Line 08/17/18 Left Radial   08/17/18    1800    08/20/18    1700    Radial   2    [REMOVED] NG/OG Tube Orogastric Right mouth   08/16/18    2353    08/20/18    1522    Right mouth   3                  Signed by: Berenice Bouton, MD  Date/Time: 08/22/18 1:59 PM

## 2018-08-23 DIAGNOSIS — E8889 Other specified metabolic disorders: Secondary | ICD-10-CM

## 2018-08-23 LAB — GLUCOSE WHOLE BLOOD - POCT
Whole Blood Glucose POCT: 179 mg/dL — ABNORMAL HIGH (ref 70–100)
Whole Blood Glucose POCT: 205 mg/dL — ABNORMAL HIGH (ref 70–100)
Whole Blood Glucose POCT: 213 mg/dL — ABNORMAL HIGH (ref 70–100)
Whole Blood Glucose POCT: 218 mg/dL — ABNORMAL HIGH (ref 70–100)
Whole Blood Glucose POCT: 224 mg/dL — ABNORMAL HIGH (ref 70–100)
Whole Blood Glucose POCT: 238 mg/dL — ABNORMAL HIGH (ref 70–100)

## 2018-08-23 LAB — CBC AND DIFFERENTIAL
Absolute NRBC: 0 10*3/uL (ref 0.00–0.00)
Basophils Absolute Automated: 0.04 10*3/uL (ref 0.00–0.08)
Basophils Automated: 0.4 %
Eosinophils Absolute Automated: 0.06 10*3/uL (ref 0.00–0.44)
Eosinophils Automated: 0.6 %
Hematocrit: 30.5 % — ABNORMAL LOW (ref 34.7–43.7)
Hgb: 10.3 g/dL — ABNORMAL LOW (ref 11.4–14.8)
Immature Granulocytes Absolute: 0.14 10*3/uL — ABNORMAL HIGH (ref 0.00–0.07)
Immature Granulocytes: 1.4 %
Lymphocytes Absolute Automated: 1.32 10*3/uL (ref 0.42–3.22)
Lymphocytes Automated: 13.5 %
MCH: 30 pg (ref 25.1–33.5)
MCHC: 33.8 g/dL (ref 31.5–35.8)
MCV: 88.9 fL (ref 78.0–96.0)
MPV: 9.8 fL (ref 8.9–12.5)
Monocytes Absolute Automated: 1.33 10*3/uL — ABNORMAL HIGH (ref 0.21–0.85)
Monocytes: 13.6 %
Neutrophils Absolute: 6.86 10*3/uL — ABNORMAL HIGH (ref 1.10–6.33)
Neutrophils: 70.5 %
Nucleated RBC: 0 /100 WBC (ref 0.0–0.0)
Platelets: 252 10*3/uL (ref 142–346)
RBC: 3.43 10*6/uL — ABNORMAL LOW (ref 3.90–5.10)
RDW: 13 % (ref 11–15)
WBC: 9.75 10*3/uL — ABNORMAL HIGH (ref 3.10–9.50)

## 2018-08-23 LAB — BASIC METABOLIC PANEL
BUN: 40 mg/dL — ABNORMAL HIGH (ref 7.0–19.0)
CO2: 15 mEq/L — ABNORMAL LOW (ref 22–29)
Calcium: 8.5 mg/dL (ref 7.9–10.2)
Chloride: 115 mEq/L — ABNORMAL HIGH (ref 100–111)
Creatinine: 1.1 mg/dL — ABNORMAL HIGH (ref 0.6–1.0)
Glucose: 210 mg/dL — ABNORMAL HIGH (ref 70–100)
Potassium: 3.6 mEq/L (ref 3.5–5.1)
Sodium: 146 mEq/L — ABNORMAL HIGH (ref 136–145)

## 2018-08-23 LAB — GFR: EGFR: 48.6

## 2018-08-23 LAB — MAGNESIUM: Magnesium: 2.1 mg/dL (ref 1.6–2.6)

## 2018-08-23 MED ORDER — INSULIN GLARGINE 100 UNIT/ML SC SOLN
10.00 [IU] | Freq: Every morning | SUBCUTANEOUS | Status: DC
Start: 2018-08-24 — End: 2018-08-24
  Filled 2018-08-23: qty 10

## 2018-08-23 MED ORDER — POTASSIUM CHLORIDE 10 MEQ/100ML IV SOLN (WRAP)
10.0000 meq | INTRAVENOUS | Status: AC
Start: 2018-08-23 — End: 2018-08-23
  Administered 2018-08-23: 06:00:00 10 meq via INTRAVENOUS
  Filled 2018-08-23: qty 100

## 2018-08-23 NOTE — SLP Progress Note (Signed)
[facility]   SLP Treatment Note  [name]    MRN#: [id number]     Treatment Type: dysphagia    Recommendations/Plan:   - Diet Recommendations: mechanically altered solids, nectar-thick liquids  - Medication Route: whole in puree  - Aspiration Precautions: upright @ 90 degrees, slow rate, small sips, NO STRAWS  -Continue SLP services for dysphagia     SLP Frequency Recommended: 3-4x/wk    Discharge recommendations: Defer to PT/OT recommendation    Assessment:   Patient with improvement in swallow function this session, presents with mild oral and suspect pharyngeal dysphagia.  Patient alert and engaged.  Self-fed all consistencies with min assist 2/2 weakness.  Oral phase mildly prolonged  Pharyngeal phase delayed with reduced hyolaryngeal elevation to palpation.  Throat clear on first ice chip and after regular solids.  No other overt s/s aspiration.  Voice clear and dry.   VSS.    SLP recommends initiate diet of mechanically altered solids and nectar-thick liquids.  Meds whole in applesauce.  D/C PO and notify SLP with any s/s aspiration.  SLP will follow closely.   Subjective:   Pain: Denies  Current Diet: NPO, no access  Respiratory Status: RA  Precautions: fall and aspiration  Interpreter services: N/A  Intubated 6/5-6/9     RN cleared patient for session; reports patient is managing secretions, able to sit upright, and alert. Patient is alert and agreeable to session. No  family at bedside.     Objective:   Oral care/assesment: moist and pink oropharynx; only mild dysphonia, voice dry   PO trials provided: ice chips x4, thin tsp x3 and cup x3, nectar by spoon x5 and cup x2 oz, puree x3, regular solid x1  Oral phase: adequate bolus acceptance, adequate lip seal, improved oral control, prolonged a/p transit, no oral residue   Pharyngeal phase (on palpation): delayed swallow initiation, diminished hyolaryngeal elevation, single swallow per bolus  S/s aspiration: immediate weak cough after  4th ice chip, after  thin tsp, after 2nd nectar tsp, and after both puree trials  Vitals: Throat clear on first ice chip and after regular solids.  No other overt s/s aspiration.  Voice clear and dry.   VSS.          Patient Education: verbal/demo; patient is confused and unable to demonstrate understanding.    Patient left with call bell within reach, all needs met, SCDs in place, fall mat in place, bed alarm   activated and all questions answered. RN notified of session outcome and patient response.    Goals:   1. Patient will tolerate 5/5 ice chips trials without s/s aspiration. MET  2. Patient will tolerate 5/5 liquid trials without s/s aspiration. MET  3. Patient will tolerate mech altered/nectar for 24-48 hrs with no overt s/s aspiration. NEW       [place] Sahory Nordling MS, CCC-SLP  08/23/2018        Time of Treatment:  SLP Received On: 08/23/18  Start Time: 1610  Stop Time: 1635  Time Calculation (min): 25 min

## 2018-08-23 NOTE — Progress Notes (Signed)
CCU Cardiology Consult Daily Note  Patient's Name: [name]   Attending Provider: Sharyn Lull, MD  Admit Date:08/16/2018  Medical Record Number: [id number]   Room:  FI111/FI111-01  Date/Time: 08/23/18 12:21 PM   Cardiology: [facility]    Impression/Recommendations-  Mrs. Pincock is a 73 year old female with a history of heart failure with reduced ejection fraction with transient recovery who presented with ventricular fibrillation.  She had a witnessed cardiac arrest with immediate CPR.  ROSC was obtained after 3 cardioversions and a single dose of epinephrine.  She was started on hypothermic protocol with goal temperature reached to 815 on August 17, 2018.  A repeat echocardiogram demonstrated an EF of 20%.  She was extubated on 08/20/2018 and remains hemodynamically stable. Mild chest pain with uptrending troponin today.     #1. VF arrest  - holding anti-arrhythmics per EP  - CRT-D tentatively scheduled for Monday 08/26/18  - negative ischemic evaluation  - she has failed a swallow evaluation twice, she has agreed to   - removed IJ today    #2. Non-ischemic cardiomyopathy  - start coreg 3.125 mg BID today and valsartan tomorrow once she is able to swallow or has corepak in place  - not significantly overloaded on exam , PRN diuresis  - will need CRT-D prior to discharge    #3. Pulmonary edema  - resolved  - will need diuresis PRN     #4: Elevated troponin: demand ischemia, LHC showed no obstructive CAD      M Raynald Blend MD  CICU Cardiology Attending  ---------------------------------------------------------    73 year old female with history of diabetes and post viral cardiomyopathy, who suffered a witnessed cardiac arrest at home secondary to ventricular dysrhythmia and achieved ROSC post CPR and defibrillationX3. 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    Overnight: Remained  hemodynamically stable,failed swallow study twice, no chest pain, no chest pressure, PND, orthopnea or worsening LE edema.    Review of systems:  No constitutional complaints, no respiratory complaints, no musculoskeletal complaints, or other pertinent review of symptoms except as noted above.    Current medications:  Current Facility-Administered Medications   Medication Dose Route Frequency    atorvastatin  10 mg Oral Daily    heparin (porcine)  5,000 Units Subcutaneous Q12H SCH    [START ON 08/24/2018] insulin glargine  10 Units Subcutaneous QAM    insulin regular  1-8 Units Subcutaneous 4 times per day    lidocaine  2 patch Transdermal Q24H     Allergies:  Allergies   Allergen Reactions    Shellfish-Derived Products Anaphylaxis     Weight:   Wt Readings from Last 4 Encounters:   08/23/18 66.2 kg (145 lb 15.1 oz)   12/29/16 65.8 kg (145 lb)   09/13/12 62.6 kg (138 lb)   02/08/11 61 kg (134 lb 6.4 oz)     Physical Exam:  Vitals:    08/23/18 1000   BP: 166/77   Pulse: 87   Resp: (!) 9   Temp:    SpO2: 98%      General: well developed female, NAD  Eyes: No xanthelasma.  Oropharynx: No mucosal pallor.  Neck: Trachea midline, JVD 10 cm  Pulmonary: Normal respiratory effort, clear lung sounds bilaterally.  Cardiovascular: RRR, nMRG  Abdomen: Soft  Extremities: No edema, no clubbing, no cyanosis.  Warm and perfused  Skin: No tendinous xanthomas    ECG:   Previous:  NSR, LBBB in 2018  Today:  NSR, LBBB (no Sgarbosa's criteria)    Previous cardiac testing   #1. Echocardiogram (08/19/18): mildly dilated LV, EF 21%, mild MR and TR.  Compared to previous study dated 12/2015 there has been a decline in EF from 40% to 21%  #2. Left heart catheterization (08/22/18): no obstructive CAD    Laboratory:    Recent Labs     08/23/18  0413 08/22/18  0431 08/21/18  0308   Sodium 146* 142 142   Potassium 3.6 3.6 3.6   Chloride 115* 114* 111   CO2 15* 18* 22   BUN 40.0* 34.0* 23.0*   Calcium 8.5 8.0 8.3   Magnesium 2.1  --  1.9      Recent Labs     08/23/18  0413 08/22/18  0431 08/21/18  0308   WBC 9.75* 9.39 11.49*   Hgb 10.3* 9.3* 8.7*   Hematocrit 30.5* 27.2* 26.2*   Platelets 252 216 182     No results for input(s): PT, INR, PTT in the last 72 hours.       Signed by: Dianna Rossetti, MD   Date/Time: 08/23/18 12:21 PM

## 2018-08-23 NOTE — Plan of Care (Signed)
A&0 *2 ( self, place), pleasantly confused but easily directable.  pt experiencing urinary retention, 500 ml on bladder scan, Resident made aware with orders to place foley. Around 2300, noticed ST elevation on V1 lead, resident made aware with no further orders    Will continue to monitor

## 2018-08-23 NOTE — Progress Notes (Addendum)
NUTRITION FOLLOW-UP    Intervention:  1. Agree with placing Corpak if fails swallow eval again.  Once placement is confirmed, recommend Promote goal 70 ml/h (1540 kcals, 96 gm pro, 1292 ml free water).   Can use TwoCal HN goal of 35 ml/h +2 pkts Prosource if cannot advance to goal d/t residuals >500 ml.  2.   FWF 250 ml q6h to help with hypernatremia or per team.  3.   If pt passes her swallow eval, recommend diet advancement per SLP with assistance with meals prn.   If oral intake <50% of meals can offer Glucerna, Ensure Compact, Gelatein 20 to help optimize her nutrition intake.    Clinical Progress: Extubated 6/9.  Agitated/confused and pulling out lines despite mittens post-extubation, requiring 1:1 sitter.  Failed SLP swallow eval 6/10 with mild oral and mod-severe pharyngeal dysphagia, and 6/11 at least moderate oropharyngeal dysphagia observed.  Repeat SLP eval today pending, and plan to place Corpak if fails again, per MD note.  Remains confused.  BiV-ICD planned for 6/15.    GI Fxn: +BM today (liquid stools reported).  Soft abd with active BS.    Biochemical Data/Medications:  Pertinent Labs: glu 210, BUN 40, Creat 1.1, Na 146, Cl 115, CO2 15  Pertinent Meds: atorvastatin, heparin, lantus, regular insulin    Current Nutrition Prescription:  Diet/Supplement Order: NPO    Nutrition Needs: 1360-1660 kcals, 81-134 gm pro    M/E:  Labs, meds, GI, PO intake/Nutrition support goals, med tx plan    Tara Perry, RD  Available via [employer]  Spectra: [id number]  Cell: [phone]  Main RD office: [phone]

## 2018-08-23 NOTE — UM Notes (Signed)
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 On 6/10, she had a LHC that demonstrated no CAD. She will have BiV ICD placed on Monday, 6/15.    Post-extubation, the patient still has dysphagia and resolved delirium. She failed speech evaluation x2, but she is demonstrating daily improvement in her neurologic function.     Today she demonstrated ketonuria and acidosis c/w starvation ketosis. Patient wants to try another speech evaluation today, and was amenable to receive an NG tube if she fails again. She is otherwise hemodynamically stable.    Transfer to: PCCU.meds  BP 149/74    Pulse 92    Temp 98 F (36.7 C) (Oral)    Resp 18    Ht 1.626 m (5' 4.02")    Wt 66.2 kg (145 lb 15.1 oz)    SpO2 96%    BMI 25.04 kg/m   Room air.    Scheduled Meds:  Current Facility-Administered Medications   Medication Dose Route Frequency    atorvastatin  10 mg Oral Daily    heparin (porcine)  5,000 Units Subcutaneous Q12H [facility]    [START ON 08/24/2018] insulin glargine  10 Units Subcutaneous QAM    insulin regular  1-8 Units Subcutaneous 4 times per day    lidocaine  2 patch Transdermal Q24H           08/23/18  0413   BUN 40.0*   Creatinine 1.1*   Sodium 146*   Potassium 3.6   Magnesium 2.1   Chloride 115*   Glucose 210*   CO2 15*       08/23/18  0413   WBC 9.75*   Hgb 10.3*   Hematocrit 30.5*   Platelets 252   AST (SGOT)  --    ALT  --    EGFR 48.6     Patty Sermons, RN BSN  Utilization Review, [facility]  [address] [place], [place]  [address]  NPI:    [id number]  Tax ID:  [id number]   [phone] (Voicemail only - non urgent msgs.)

## 2018-08-23 NOTE — Progress Notes (Signed)
Patient transferred to PCCU 155, with belonings (earrings). Family aware of transfer.

## 2018-08-23 NOTE — Progress Notes (Signed)
ICU/CCU Daily Progress Note    Patient's Name: Tara Perry    Room:  FI111/FI111-01  Attending Provider: Sharyn Lull, MD  Admit Date:08/16/2018  Medical Record Number: [id number]     Date/Time: 08/23/18 8:05 AM      Ms Meyerhoff is a 73 y/o woman with PMHx of DMII and post viral cardiomyopathy who underwent a witnessed cardiac arrest at home secondary to ventricular dysrhythmia and achieved ROSC post CPR and defibrillationX3.     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 She was intubated for airway protection, and upgraded to CICU for hypothermia protocol.    She completed the hypothermia protocol on 6/8 with successful rewarming. She demonstrated improvement in her neurological function, and was extubated on 6/9. On  6/10, she developed chest pain with slight ECG changes and troponin leak c/w Type II NSTEMI given LHC demonstrated no CAD. The chest pain has since resolved.     Her neurological function continues to improve. Will plan tentatively for BiV ICD placement on Monday, 6/15.       24hr events:  NAEON  Had urinary retention, patient was straight cath'd.  Awaiting medicine bed  Still NPO, awaiting repeat speech evaluation      Spoke to patient at bedside. She states feels better. Her chest pain has resolved. She endorses urinary frequency and urgency. She denies SOB, nausea, vomiting, abdominal pain, dizziness, fever, or chills.      Patient Active Problem List   Diagnosis    Peritonitis    Viral cardiomyopathy    Type II diabetes mellitus without mention of complication    Gout synovitis    Pulmonary edema    Type 2 diabetes mellitus without complications    SIRS (systemic inflammatory response syndrome)    Viral cardiomyopathy    Dyspnea    CHF (congestive heart failure)    Cardiac arrest       VITAL SIGNS PHYSICAL EXAM   Temp:  [97.5 F (36.4 C)-99 F (37.2 C)] 99 F (37.2 C)  Heart Rate:  [85-96] 87  Resp Rate:  [25-39] 29  BP:  (149-184)/(61-85) 161/83  Vent Settings  Vent Mode: PS/CPAP  FiO2: 40 %  Resp Rate (Set): 12  Vt (Set, mL): 440 mL  PIP Observed (cm H2O): 12 cm H2O  PEEP/EPAP: 6 cm H20  Pressure Support / IPAP: 6 cmH20  Mean Airway Pressure: 7 cmH20    Intake/Output Summary (Last 24 hours) at 08/23/2018 0805  Last data filed at 08/23/2018 0616  Gross per 24 hour   Intake --   Output 890 ml   Net -890 ml    Physical Exam  Gen: Patient resting comfortably. AAO x3. Follows commands.   HEENT: Dry oral mucosa. PERRLA. No scleral icterus  Neck: No LAD  Cardiac: S1 and S2 audible and regular. No murmurs or rubs present  Lungs: CTA x2  Abdomen: + BS in all four quadrants. NT/ND. No rigidity or guarding   Ext: Peripheral pulses +2. No edema or cyanosis present  Neuro: Answers questions appropriately, voice stronger, swallows water, stronger cough, UE muscle strength 4/5. No facial droop  Skin: No petechiae, UE ecchymoses, warm and dry          Scheduled Meds: PRN Meds:    atorvastatin, 10 mg, Oral, Daily  heparin (porcine), 5,000 Units, Subcutaneous, Q12H SCH  insulin glargine, 7 Units, Subcutaneous, QAM  insulin regular, 1-8 Units, Subcutaneous, 4 times per day  lidocaine, 2 patch, Transdermal, Q24H  potassium chloride, 10 mEq, Intravenous, Q1H        Continuous Infusions:   acetaminophen, 650 mg, Q6H PRN  dextrose, 15 g of glucose, PRN    And  dextrose, 12.5 g, PRN    And  glucagon (rDNA), 1 mg, PRN  hydrALAZINE, 10 mg, Q6H PRN            Labs (last 72 hours):  Recent Labs     08/23/18  0413 08/22/18  0431 08/21/18  0308   WBC 9.75* 9.39 11.49*   Hgb 10.3* 9.3* 8.7*   Hematocrit 30.5* 27.2* 26.2*     No results for input(s): PT, INR, PTT in the last 72 hours. Recent Labs     08/23/18  0413 08/22/18  0431 08/21/18  0308   Sodium 146* 142 142   Potassium 3.6 3.6 3.6   Chloride 115* 114* 111   CO2 15* 18* 22   BUN 40.0* 34.0* 23.0*   Creatinine 1.1* 1.3* 1.1*   Glucose 210* 165* 132*   Calcium 8.5 8.0 8.3   Magnesium 2.1  --  1.9                  Microbiology:   Microbiology Results     Procedure Component Value Units Date/Time    Blood Culture Aerobic/Anaerobic #1 [[id number]] Collected:  08/17/18 0105    Specimen:  Arm from Blood, Venipuncture Updated:  08/22/18 0621    Narrative:       ORDER#: [id number]                                    ORDERED BY: Effie Shy, PATRIC  SOURCE: Blood, Venipuncture steripath                COLLECTED:  08/17/18 01:05  ANTIBIOTICS AT COLL.:                                RECEIVED :  08/17/18 03:28  Culture Blood Aerobic and Anaerobic        FINAL       08/22/18 06:21  08/22/18   No growth after 5 days of incubation.      Blood Culture Aerobic/Anaerobic #2 [[id number]] Collected:  08/17/18 0216    Specimen:  Arm from Blood, Venipuncture Updated:  08/22/18 0621    Narrative:       ORDER#: [id number]                                    ORDERED BY: Effie Shy, PATRIC  SOURCE: Blood, Venipuncture steripath  L Ac          COLLECTED:  08/17/18 02:16  ANTIBIOTICS AT COLL.:                                RECEIVED :  08/17/18 03:28  Culture Blood Aerobic and Anaerobic        FINAL       08/22/18 06:21  08/22/18   No growth after 5 days of incubation.      COVID-19 (SARS-CoV-2) [facility]) [[id number]] Collected:  08/17/18 0157    Specimen:  Nasopharyngeal Swab Updated:  08/17/18 2440  Purpose of COVID testing Screening     SARS-CoV-2 Specimen Source Nasopharyngeal     SARS CoV 2 Overall Result Negative     Comment: Test performed using the Abbott ID NOW EUA assay.  Please see Fact Sheets for patients and providers located at:  http://www.rice.biz/  This test is for the qualitative detection of SARS-CoV-2  (COVID-19)nucleic acid. Viral nucleic acids may persist in vivo,  independent of viability. Detection of viral nucleic acid does  not imply the presence of infectious virus, or that virus  nucleic acid is the cause of clinical symptoms.  Negative results do not preclude SARS-CoV-2 infection and  should not be used as  the sole basis for patient management  decisions.  Invalid results may be due to inhibiting substances in the  specimen and recollection should occur.         Narrative:       o Collect and clearly label specimen type:  o Upper respiratory specimen: One Nasopharyngeal Dry Swab NO  Transport Media.  o Hand deliver to laboratory ASAP    CULTURE BLOOD AEROBIC AND ANAEROBIC [[id number]] Collected:  08/19/18 1505    Specimen:  Arterial Blood Updated:  08/22/18 1921    Narrative:       The order will result in two separate 8-70ml bottles  Please do NOT order repeat blood cultures if one has been  drawn within the last 48 hours.  AVOID BLOOD CULTURE DRAWS FROM CENTRAL LINE IF POSSIBLE  Indications:->Fever of greater than 101.5  ORDER#: [id number]                                    ORDERED BY: Gorden Harms  SOURCE: Arterial Blood                               COLLECTED:  08/19/18 15:05  ANTIBIOTICS AT COLL.:                                RECEIVED :  08/19/18 18:45  Culture Blood Aerobic and Anaerobic        PRELIM      08/22/18 19:21  08/20/18   No Growth after 1 day/s of incubation.  08/21/18   No Growth after 2 day/s of incubation.  08/22/18   No Growth after 3 day/s of incubation.      CULTURE BLOOD AEROBIC AND ANAEROBIC [[id number]] Collected:  08/19/18 1505    Specimen:  Blood, Venipuncture Updated:  08/22/18 1921    Narrative:       The order will result in two separate 8-19ml bottles  Please do NOT order repeat blood cultures if one has been  drawn within the last 48 hours.  AVOID BLOOD CULTURE DRAWS FROM CENTRAL LINE IF POSSIBLE  Indications:->Fever of greater than 101.5  ORDER#: [id number]                                    ORDERED BY: Gorden Harms  SOURCE: Blood, Venipuncture                          COLLECTED:  08/19/18 15:05  ANTIBIOTICS AT COLL.:  RECEIVED :  08/19/18 18:45  Culture Blood Aerobic and Anaerobic        PRELIM      08/22/18 19:21  08/20/18   No Growth after 1 day/s of  incubation.  08/21/18   No Growth after 2 day/s of incubation.  08/22/18   No Growth after 3 day/s of incubation.      MRSA culture - Nares (If not done in triage) [[id number]] Collected:  08/17/18 0621    Specimen:  Culturette from Nares Updated:  08/18/18 0255     Culture MRSA Surveillance Negative for Methicillin Resistant Staph aureus    MRSA culture - Throat (If not done in triage) [[id number]] Collected:  08/17/18 0621    Specimen:  Culturette from Throat Updated:  08/18/18 0255     Culture MRSA Surveillance Negative for Methicillin Resistant Staph aureus            Imaging:  Ct Head Wo Contrast    Result Date: 08/17/2018  No acute intracranial abnormality. Eloise Harman, MD  08/17/2018 12:36 AM    Xr Chest Ap Portable    Result Date: 08/19/2018  Lines and tubes as described without pneumothorax. Bibasilar atelectasis without focal consolidation identified. Colonel Bald, MD  08/19/2018 12:35 PM    Xr Chest Ap Portable    Result Date: 08/17/2018   Right IJ central venous catheter has been pulled back with tip now at the cavoatrial junction. Shelly Flatten, MD  08/17/2018 6:29 PM    Xr Chest Ap Portable    Result Date: 08/17/2018   1. Right IJ central venous catheter placement with tip in the right atrium. 2. Retrocardiac opacity may represent atelectasis or effusion. Shelly Flatten, MD  08/17/2018 5:28 PM    Chest Ap Portable    Result Date: 08/17/2018   1. ET tube tip 3.2 cm above the carina and gastric drainage tube sidehole in the stomach and tip below the field-of-view. 2. Central vascular congestion, likely upper lobe edema, and bibasilar atelectasis. Infection/aspiration cannot be entirely excluded. 3. Borderline enlarged cardiopericardial silhouette. Eloise Harman, MD  08/17/2018 12:11 AM        Assessment and Plan:  Ms Silverstein is a 73 y/o woman with PMHx of DMII and post viral cardiomyopathy who underwent a witnessed cardiac arrest at home secondary to ventricular dysrhythmia and achieved ROSC post CPR and  defibrillationX3.     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 She was intubated for airway protection, and upgraded to CICU for hypothermia protocol.    She completed the hypothermia protocol on 6/8 with successful rewarming. She demonstrated improvement in her neurological function, and was extubated on 6/9. On  6/10, she developed chest pain with slight ECG changes and troponin leak c/w Type II NSTEMI given LHC demonstrated no CAD. The chest pain has since resolved.     Her neurological function continues to improve. Will plan tentatively for BiV ICD placement on Monday, 6/15.      NEURO:  #Pain from CPR - Improving  - Patient is now s/p rewarming phase. Now off propofol since 06/08.  - Continue Lidocaine patches and PRN Tylenol 650 mg Q6H      #Delirium - Improving  - S/p TTM with rewarming and off sedation  - CT head without any acute intracranial abnormality  - Continue delirium precautions  - B12 and folate normal  - D/c sitter      CARDIO:  #VFib Arrest s/p  ROSC  ROSC achieved after 1mg  of Epi and 3 shocks (~10 minutes)   Likely in the setting of viral cardiomyopathy, EF 40% with LBBB  - s/p TTM protocol  - Continuous telemetry monitoring  -LHC 6/10 with clean coronaries  - EP consulted, appreciate recs. Will plan for CRT-D placement tentatively on Monday, 6/15.      #NICM / HFrEF  -EF40% in 2017  -TTE 06/08 with decreased EF to 21%, LV dilation and multiple wall motion abnormalities.  -Patient failed SLP. Patient requested another opportunity to try swallowing. If she fails, she is more amenable to NG tube placement. Will start carvedilol 3.125 mg BID today, and valsartan 40 mg PO daily tomorrow once Corepak is in place or passes SLP  -In the interim will increase Hydralazine 10 mg IV PRN Q6H for systolics > 160      #Transient Cardiogenic Vasopressor Requirement - resolved      PULMONARY:  #Acute Hypoxic Respiratory Failure in the setting of  Cardiac Arrest - resolved#      RENAL:  #AKI on CKD Stage III - at baseline Cr 1-1.3#    #Starvation Ketosis  -U/A with ketonuria, glycosuria  -BMP with AG acidosis, normal sugar argues against DKA  -Will follow SLP eval, if fails again will place NG tube      #Hypernatremia  -1.7 free water deficit  -Will ideally give PO      GI:  #Transaminitis - resolved    #Loose Bowel Movements  -No fever of abdominal cramping to suggest an infectious etiology  -Fecal leukocytes negative      Nutrition:   -NPO due to residual swallowing difficulty from cardiac arrest.   -SLP consulted, appreciate recs  -Patient failed speech x2, will re-evaluate today given stronger cough and speech  -Will place Core-pak if fails again    ID:  #Sirs - resolved#  #Concern for PNA - resolved   - On 06/09 with increasing leukocytosis and no fever suspected due to cooling blanket.   -CXR on 6/8 with trace bibasilar opacities  -No fever in 48 hours, or tachycardia or hypotension  - BCx 06/08: NGTD x3 .BCx 06/06: NGD5 MRSA negative.  -S/p Vanc 1.5 mg x1  -S/p Zosyn 4.5 g Q8H (6/8-6/10)  -Monitor off antibiotics            Hem/Onc:  #Reative leukocytosis-resolved      #Normocytic normochromia anemia - improving  - No evidence of active bleeding, suspect in setting of phlebotomy with inappropriate bone marrow response  -Retic index of 0.74 c/w hypo-proliferative disorder  -Normal haptoglobin and potassium argues against hemolysis  -Iron replete, B12 and folate replete      Endo:  #T2DM  - Increase Lantus to 10 units daily while NPO, 14 U when eating  -Continue SSI Regular, will transition to Humalog once eating or corepak in place       GU:  #Urinary Incontinence  -Suspect in setting of brain injury from arrest  -U/a 6/11 without nitrites or leukocyte esterase  -Bladder scan PRN, anticipate improvement with time      Prophylaxis:  - VTE:  SubQ Heparin  - PPI:  D/c  - BM: Holding given soft bowel movements      Code Status:  Full    Lines/Drains/Airways:    Patient Lines/Drains/Airways Status    Active Lines, Drains and Airways     Name:   Placement date:   Placement time:   Site:   Days:  Peripheral IV 08/17/18 Left Forearm   08/17/18    --    Forearm   6    Urethral Catheter   08/22/18    2100    --   less than 1         Inactive Lines, Drains and Airways     Name:   Placement date:   Placement time:   Removal date:   Removal time:   Site:   Days:    [REMOVED] CVC Triple Lumen 08/17/18 Right Internal jugular   08/17/18    1800    08/22/18    1125    Internal jugular   4    [REMOVED] Peripheral IV 08/16/18 Right Antecubital   08/16/18    2341    08/19/18    1223    Antecubital   2    [REMOVED] Peripheral IV 08/16/18 Right Forearm   08/16/18    2351    08/19/18    1714    Forearm   2    [REMOVED] Urethral Catheter Non-latex 16 Fr.   08/17/18    0003    08/21/18    1500    Non-latex   4    [REMOVED] ETT  7.5 mm   08/16/18    2342    08/20/18    1523     3    [REMOVED] Intraosseous Line 08/17/18 Tibia   08/17/18    --    08/17/18    0630    Anterior;Right   less than 1    [REMOVED] Peripheral Arterial Line 08/17/18 Left Radial   08/17/18    1800    08/20/18    1700    Radial   2    [REMOVED] NG/OG Tube Orogastric Right mouth   08/16/18    2353    08/20/18    1522    Right mouth   3                  Signed by: Berenice Bouton, MD  Date/Time: 08/23/18 8:05 AM

## 2018-08-23 NOTE — Progress Notes (Signed)
[facility]   PROGRESS NOTE     [facility]       Date Time: 08/23/18 9:38 AM Patient Name: Tara Perry, Tara Perry  Medical Record #:  [id number] Account#:  [id number] Admission Date:  08/16/2018  Primary Cardiologist: Timmothy Euler MD, [facility]     EP Attending Impressions:    [name] is recovering post cardiac arrest. We will plan for BIV ICD Monday afternoon with Dr. Marianna Fuss as patient continues to improve.    Patient seen and examined, my assessment and plans as noted below.    Signed by    Ida Rogue, MD      Assessment:    VF arrest, witnessed with bystander CPR performed, electrically resuscitated with 3 defibrillations, 1 ampule of epinephrine given.   Hypothermia protocol completed on 08/19/2018   NICM: EF 20% on 08/19/2018   Troponin 0.6 .type II troponin leak   LHC: No CAD 08/21/2018    Acute kidney injury    LBBB, chronic    Diabetes mellitus   Lekocytosis(pneumonia): bcx No growth so far    Recommendations:    EF now 20%, recommend BiV ICD implant    Continue following up the bcx   Continue cardiac monitoring   Monitor and replete electrolytes to keep K > 4 and Mag > 2.   Strict intake/output and daily standing weight   Low sodium diet (<2000 mg/day)    Medications:      Scheduled Meds:    atorvastatin, 10 mg, Oral, Daily  heparin (porcine), 5,000 Units, Subcutaneous, Q12H SCH  [START ON 08/24/2018] insulin glargine, 10 Units, Subcutaneous, QAM  insulin regular, 1-8 Units, Subcutaneous, 4 times per day  lidocaine, 2 patch, Transdermal, Q24H        Continuous Infusions:       Subjective:   AAOx3. Chest soreness from CPR but otherwise no complaints. 6 runs of NSVT overnight    Physical Exam:     VITAL SIGNS PHYSICAL EXAM   Temp:  [97.5 F (36.4 C)-99 F (37.2 C)] 98.8 F (37.1 C)  Heart Rate:  [85-96] 92  Resp Rate:  [25-40] 40  BP: (149-184)/(61-85) 169/77  Temp (24hrs), Avg:98.5 F (36.9 C), Min:97.5 F (36.4 C), Max:99 F (37.2 C)          Intake/Output Summary (Last 24 hours)  at 08/23/2018 5409  Last data filed at 08/23/2018 0616  Gross per 24 hour   Intake    Output 890 ml   Net -890 ml    Physical Exam  General: AAOx3, sitting in chair  Head: normocephalic  Cardiovascular: RRR no m/r/g  Neck: no carotid bruits or JVD  Lungs: clear to auscultation bilaterally, without wheezing, rhonchi, or rales  Abdomen: soft, non-distended; normoactive bowel sounds  Extremities: no edema  Psych/Neuro: AAOx3  .         Labs:           Recent Labs   Lab 08/23/18  0413   Magnesium 2.1     Recent Labs   Lab 08/18/18  2257 08/18/18  1533   PT  --  13.8   PT INR  --  1.1   PTT 34 28     Recent Labs   Lab 08/23/18  0413 08/22/18  0431 08/21/18  0308   WBC 9.75* 9.39 11.49*   Hgb 10.3* 9.3* 8.7*   Hematocrit 30.5* 27.2* 26.2*   Platelets 252 216 182     Recent Labs   Lab  08/23/18  0413 08/22/18  0431 08/21/18  0308   Sodium 146* 142 142   Potassium 3.6 3.6 3.6   Chloride 115* 114* 111   CO2 15* 18* 22   BUN 40.0* 34.0* 23.0*   Creatinine 1.1* 1.3* 1.1*   EGFR 48.6 40.0 48.6   Glucose 210* 165* 132*   Calcium 8.5 8.0 8.3           Invalid input(s): FREET4  Recent Labs   Lab 08/22/18  0431   Bilirubin, Total 0.5   Bilirubin, Direct 0.3   Protein, Total 5.5*   Albumin 2.8*   ALT 34   AST (SGOT) 22     Recent Labs   Lab 08/21/18  0810 08/21/18  0118 08/20/18  2138   Troponin I 0.60* 0.19* 0.06*     .  Lab Results   Component Value Date    BNP 139 (H) 08/16/2018      Estimated Creatinine Clearance: 42.6 mL/min (A) (based on SCr of 1.1 mg/dL (H)).  Diagnostics:   Telemetry: NSVTx1(6 runs)  Echo 08/19/2018      * The left ventricle is mildly dilated.    * Left ventricular wall thickness is normal.    * Left ventricular systolic function is severely decreased with an ejection  fraction by Biplane Method of Discs of  21 %.    * There is    * dyskinesis of the basal and mid anterior, anteroseptal and inferoseptal  left ventricular wall(s). There also is moderate hypokinesis of the basal  inferior wall.    * Contrast was  administered and enabled adequate wall motion interpretation.    * Normal right ventricular systolic function.    * There is mild thickening and calcification of the aortic leaflets.    * There is mild mitral regurgitation.    * There is mild tricuspid regurgitation.    * Compared to the prior study dated 12/14/2015, the EF has decreased from  40% to 21% with wall motion abnormalities.      LHC 08/21/2018    1.  No coronary artery disease.  2.  Nonischemic cardiomyopathy.     Garvin Fila, MD  Cardiac fellow        Arrhythmia Spectralink [phone] (8am-4:30pm)  After hours, non urgent consult line [phone]  After Hours, urgent consults [phone]

## 2018-08-23 NOTE — Progress Notes (Addendum)
MEDICINE TRANSFER ACCEPT NOTE    Date Time: 08/23/18 3:10 PM  Patient Name: Tara Perry  Attending Physician: Anselm Pancoast, MD    Hospital Course/Interim Summary:   This is a 73 year old woman with a history of T2DM and HFrEF with prior transient recovery who presented 6/5 via EMS after witnessed ventricular fibrillation arrest at home.  She received 3 shocks, 1 round of epi with successful ROSC.  She was intubated for airway protection and underwent therapeutic hypothermia protocol due to poor mental status.  Echocardiogram showed depressed left ventricular ejection fraction of 20%.  She was rewarmed 6/8 and showed neurologic improvement, able to be extubated 6/9.  Left heart catheterization 6/10 showed no significant coronary artery disease.  Patient was stepped down to medicine floor 6/12, still with dysphagia though mental status noted to be continuing to improve.    Assessment:     1. Sudden cardiac death, status post ventricular fibrillation arrest  2. 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 Elevated troponin, demand ischemia from above.  No CAD on left heart cath  4. Hypoxic/anoxic encephalopathy, improving  5. Acute hypoxic respiratory failure status post intubation for airway protection  6. Dysphagia secondary to encephalopathy  7. T2DM (non-insulin dependent prior to admission)  8. Hypernatremia from inability to drink to thirst/dysphagia  9. AKI on CKD III, resolved (hypoperfusion injury related to cardiac death)  10. AGMA from fasting ketosis  11. Urinary retention after initial incontinence    Plan:    Planning for biventricular ICD placement Monday 6/15 for secondary prevention   [facility] will follow after transfer out of CICU   Plan to repeat swallow evaluation.  She has failed twice.  If she fails again, agrees to NG tube   TF with promote at 39ml/h and FWF 250 q6h if she has NG placed and confirmed   Check  hemoglobin A1C   Foley in place, consider voiding trial in 2-3 days   Gradually institute GDMT, cavedilol today, valsartan tomorrow   No standing diuresis, monitor volume status and diurese PRN. Strict I/O   Monitor for resolution of hypernatremia once begins PO intake or TF    Safety Checklist:   DVT Prophylaxis: SC heparin  Nutrition: NPO currently pending repeat SLP eval  Code Status: Full  Lines:     Patient Lines/Drains/Airways Status    Active PICC Line / CVC Line / PIV Line / Drain / Airway / Intraosseous Line / Epidural Line / ART Line / Line / Wound / Pressure Ulcer / NG/OG Tube     Name:   Placement date:   Placement time:   Site:   Days:    Peripheral IV 08/17/18 Left Forearm   08/17/18    --    Forearm   6    Urethral Catheter   08/22/18    2100    --   less than 1    Puncture Site 08/21/18 Arterial Right Arm   08/21/18    1154     2                 Disposition:     Today's date: 08/23/2018   Admit Date: 08/16/2018 11:32 PM  Reason for ongoing hospitalization: cardiac death  Anticipated discharge needs: TBD    Subjective     CC: Cardiac arrest    Interval History/24 hour events/HPI/Subjective: Stepped down form CICU. Feels well today, notes weakness and some forgetfulness but otherwise  no complaints. No cough, CP, SOB, F/C n/v/d, bleeding.        Review of Systems:     Negative except per HPI    Physical Exam:     VITAL SIGNS PHYSICAL EXAM   Temp:  [98.1 F (36.7 C)-99 F (37.2 C)] 98.2 F (36.8 C)  Heart Rate:  [85-97] 92  Resp Rate:  [9-33] 20  BP: (149-169)/(68-83) 163/74        Intake/Output Summary (Last 24 hours) at 08/23/2018 1510  Last data filed at 08/23/2018 1200  Gross per 24 hour   Intake --   Output 1090 ml   Net -1090 ml    Physical Exam  General appearance: AOx3, NAD  Cardiovascular: RRR, no murmurs, no JVD  Lungs: CTAB, no wheezing  Abdomen: soft, non-tender, non-distended; no palpable masses,  normoactive bowel sounds  Extremities: trace ble edema  Neuro: face symmetric, moving all  extremities with 5/5 strength  GU: Bladder with no fullness or tenderness.  Foley in place       Meds:     Medications were reviewed.    Labs:     Labs (last 72 hours):    Recent Labs   Lab 08/23/18  0413 08/22/18  0431   WBC 9.75* 9.39   Hgb 10.3* 9.3*   Hematocrit 30.5* 27.2*   Platelets 252 216       Recent Labs   Lab 08/18/18  2257 08/18/18  1533 08/18/18  0824   PT  --  13.8 14.1   PT INR  --  1.1 1.1   PTT 34 28 30       Microbiology, reviewed and are significant for:  Microbiology Results     Procedure Component Value Units Date/Time    Blood Culture Aerobic/Anaerobic #1 [[id number]] Collected:  08/17/18 0105    Specimen:  Arm from Blood, Venipuncture Updated:  08/22/18 0621    Narrative:       ORDER#: [id number]                                    ORDERED BY: Effie Shy, PATRIC  SOURCE: Blood, Venipuncture steripath                COLLECTED:  08/17/18 01:05  ANTIBIOTICS AT COLL.:                                RECEIVED :  08/17/18 03:28  Culture Blood Aerobic and Anaerobic        FINAL       08/22/18 06:21  08/22/18   No growth after 5 days of incubation.      Blood Culture Aerobic/Anaerobic #2 [[id number]] Collected:  08/17/18 0216    Specimen:  Arm from Blood, Venipuncture Updated:  08/22/18 0621    Narrative:       ORDER#: [id number]                                    ORDERED BY: Jerilynn Mages  SOURCE: Blood, Venipuncture steripath  L Ac          COLLECTED:  08/17/18 02:16  ANTIBIOTICS AT COLL.:  RECEIVED :  08/17/18 03:28  Culture Blood Aerobic and Anaerobic        FINAL       08/22/18 06:21  08/22/18   No growth after 5 days of incubation.      COVID-19 (SARS-CoV-2) [facility] Rapid) [[id number]] Collected:  08/17/18 0157    Specimen:  Nasopharyngeal Swab Updated:  08/17/18 0323     Purpose of COVID testing Screening     SARS-CoV-2 Specimen Source Nasopharyngeal     SARS CoV 2 Overall Result Negative     Comment: Test performed using the Abbott ID NOW EUA assay.  Please see Fact  Sheets for patients and providers located at:  http://www.rice.biz/  This test is for the qualitative detection of SARS-CoV-2  (COVID-19)nucleic acid. Viral nucleic acids may persist in vivo,  independent of viability. Detection of viral nucleic acid does  not imply the presence of infectious virus, or that virus  nucleic acid is the cause of clinical symptoms.  Negative results do not preclude SARS-CoV-2 infection and  should not be used as the sole basis for patient management  decisions.  Invalid results may be due to inhibiting substances in the  specimen and recollection should occur.         Narrative:       o Collect and clearly label specimen type:  o Upper respiratory specimen: One Nasopharyngeal Dry Swab NO  Transport Media.  o Hand deliver to laboratory ASAP    CULTURE BLOOD AEROBIC AND ANAEROBIC [[id number]] Collected:  08/19/18 1505    Specimen:  Arterial Blood Updated:  08/22/18 1921    Narrative:       The order will result in two separate 8-52ml bottles  Please do NOT order repeat blood cultures if one has been  drawn within the last 48 hours.  AVOID BLOOD CULTURE DRAWS FROM CENTRAL LINE IF POSSIBLE  Indications:->Fever of greater than 101.5  ORDER#: [id number]                                    ORDERED BY: Gorden Harms  SOURCE: Arterial Blood                               COLLECTED:  08/19/18 15:05  ANTIBIOTICS AT COLL.:                                RECEIVED :  08/19/18 18:45  Culture Blood Aerobic and Anaerobic        PRELIM      08/22/18 19:21  08/20/18   No Growth after 1 day/s of incubation.  08/21/18   No Growth after 2 day/s of incubation.  08/22/18   No Growth after 3 day/s of incubation.      CULTURE BLOOD AEROBIC AND ANAEROBIC [[id number]] Collected:  08/19/18 1505    Specimen:  Blood, Venipuncture Updated:  08/22/18 1921    Narrative:       The order will result in two separate 8-25ml bottles  Please do NOT order repeat blood cultures if one has been  drawn within the  last 48 hours.  AVOID BLOOD CULTURE DRAWS FROM CENTRAL LINE IF POSSIBLE  Indications:->Fever of greater than 101.5  ORDER#: [id number]  ORDERED BY: Gorden Harms  SOURCE: Blood, Venipuncture                          COLLECTED:  08/19/18 15:05  ANTIBIOTICS AT COLL.:                                RECEIVED :  08/19/18 18:45  Culture Blood Aerobic and Anaerobic        PRELIM      08/22/18 19:21  08/20/18   No Growth after 1 day/s of incubation.  08/21/18   No Growth after 2 day/s of incubation.  08/22/18   No Growth after 3 day/s of incubation.      MRSA culture - Nares (If not done in triage) [[id number]] Collected:  08/17/18 0621    Specimen:  Culturette from Nares Updated:  08/18/18 0255     Culture MRSA Surveillance Negative for Methicillin Resistant Staph aureus    MRSA culture - Throat (If not done in triage) [[id number]] Collected:  08/17/18 0621    Specimen:  Culturette from Throat Updated:  08/18/18 0255     Culture MRSA Surveillance Negative for Methicillin Resistant Staph aureus            Imaging, reviewed and are significant for:  Ct Head Wo Contrast    Result Date: 08/17/2018  No acute intracranial abnormality. Eloise Harman, MD  08/17/2018 12:36 AM    Xr Chest Ap Portable    Result Date: 08/19/2018  Lines and tubes as described without pneumothorax. Bibasilar atelectasis without focal consolidation identified. Colonel Bald, MD  08/19/2018 12:35 PM    Xr Chest Ap Portable    Result Date: 08/17/2018   Right IJ central venous catheter has been pulled back with tip now at the cavoatrial junction. Shelly Flatten, MD  08/17/2018 6:29 PM    Xr Chest Ap Portable    Result Date: 08/17/2018   1. Right IJ central venous catheter placement with tip in the right atrium. 2. Retrocardiac opacity may represent atelectasis or effusion. Shelly Flatten, MD  08/17/2018 5:28 PM    Chest Ap Portable    Result Date: 08/17/2018   1. ET tube tip 3.2 cm above the carina and gastric drainage tube sidehole in the  stomach and tip below the field-of-view. 2. Central vascular congestion, likely upper lobe edema, and bibasilar atelectasis. Infection/aspiration cannot be entirely excluded. 3. Borderline enlarged cardiopericardial silhouette. Eloise Harman, MD  08/17/2018 12:11 AM       Recent Labs   Lab 08/23/18  0413 08/22/18  0431  08/21/18  0118   Sodium 146* 142  More results in Results Review 142   Potassium 3.6 3.6  More results in Results Review 3.5   Chloride 115* 114*  More results in Results Review 111   CO2 15* 18*  More results in Results Review 23   BUN 40.0* 34.0*  More results in Results Review 23.0*   Creatinine 1.1* 1.3*  More results in Results Review 1.1*   Calcium 8.5 8.0  More results in Results Review 8.3   Albumin  --  2.8*  --  2.7*   Protein, Total  --  5.5*  --  5.3*   Bilirubin, Total  --  0.5  --  0.6   Alkaline Phosphatase  --  95  --  94   ALT  --  34  --  37  AST (SGOT)  --  22  --  30   Glucose 210* 165*  More results in Results Review 127*   More results in Results Review = values in this interval not displayed.        Signed by: Anselm Pancoast, MD

## 2018-08-23 NOTE — OT Eval Note (Signed)
[facility]   Occupational Therapy Evaluation     [name]    MRN#: [id number]   Unit: [facility]  Bed: FI111/FI111-01                                     Post Acute Care Therapy Recommendations:   Discharge Recommendations: Acute Rehab     Milestones to be reached to achieve recommendation: None    DME Recommended for Discharge: (TBA @ rehab)    If Acute Rehab  recommended discharge disposition is not available, patient will need hands on assist with ADLs and mobility, a FWW, shower chair, BSC, W/C and home health therapies.    Therapy discharge recommendations may change with patient status.  Please refer to most recent note for up-to-date recommendations.    Assessment:   Significant Findings: None    Tara Perry is a 73 y.o. female admitted 08/16/2018.  Patient presents with suffering a cardiac arrest at home secondary to ventricular dysrhythmia. Pt presents to OT with generalized weakness, impaired balance, poor activity tolerance, confusion and impaired processing and problem solving. Pt would benefit from continued acute OT services.     Therapy Diagnosis: decreased independence with ADLs and mobility    Rehabilitation Potential: good    Treatment Activities: OT evaluation     Educated the patient to role of occupational therapy, plan of care, goals of therapy and safety with mobility and ADLs.    Plan:   OT Frequency Recommended: 3-4x/wk     Treatment/Interventions: ADLs, therex, theract, transfer training, functional mobility, balance, endurance, A/E, safety    Risks/benefits/POC discussed with patient       Precautions and Contraindications:   Falls  Confusion     Consult received for Tara Perry for OT Evaluation and Treatment.  Patients medical condition is appropriate for Occupational Therapy intervention at this time.      History of Present Illness:    Tara Perry is a 73 y.o. female admitted on 08/16/2018 with "PMHx of DMII and post viral  cardiomyopathy who underwent a witnessed cardiac arrest at home secondary to ventricular dysrhythmia and achieved ROSC post CPR and defibrillationX3.     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 She was intubated for airway protection, and upgraded to CICU for hypothermia protocol.    She completed the hypothermia protocol on 6/8 with successful rewarming. She demonstrated improvement in her neurological function, and was extubated on 6/9. On  6/10, she developed chest pain with slight ECG changes and troponin leak c/w Type II NSTEMI given LHC demonstrated no CAD. The chest pain has since resolved.     Her neurological function continues to improve. 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 Eloise Harman, MD  08/17/2018 12:36 AM    Xr Chest Ap Portable  Result Date: 08/19/2018  Lines and tubes as described without pneumothorax. Bibasilar atelectasis without focal consolidation identified. Colonel Bald, MD  08/19/2018 12:35 PM    Xr Chest Ap Portable  Result Date: 08/17/2018   Right IJ central venous catheter has been pulled back with tip now at the cavoatrial junction. Shelly Flatten,  MD  08/17/2018 6:29 PM    Xr Chest Ap Portable  Result Date: 08/17/2018   1. Right IJ central venous catheter placement with tip in the right atrium. 2. Retrocardiac opacity may represent atelectasis or effusion. Shelly Flatten, MD  08/17/2018 5:28 PM    Chest Ap Portable  Result Date: 08/17/2018   1. ET tube tip 3.2 cm above the carina and gastric drainage tube sidehole in the stomach and tip below the field-of-view. 2. Central vascular congestion, likely upper lobe edema, and bibasilar atelectasis. Infection/aspiration cannot be entirely excluded. 3. Borderline enlarged cardiopericardial silhouette. Eloise Harman, MD  08/17/2018 12:11 AM    Social History:   Prior Level of Function: Ind ADLs and mobility  Assistive Devices: None  Baseline Activity: Community level  DME Currently at Home: None  Home Living Arrangements: Alone, reporting sister will assist at discharge  Type of Home: [facility]  Home Layout: 1 STE    Subjective: "I'm going to be here for awhile."    Patient is agreeable to participation in the therapy session. Nursing clears patient for therapy.     Patient Goal: To get better    Pain: No/Denies    Objective:   Patient is in bed with telemetry, foley and peripheral IV in place.      Cognitive Status and Neuro Exam:  A x 0 x 4  Pleasant, cooperative  Slow processing and sequencing of tasks  Decreased safety awareness and insight    Musculoskeletal Examination  RUE ROM: WFL  LUE ROM: WFL  RLE ROM: WFL  LLE ROM: WFL    RUE Strength: WFL  LUE Strength: WFL  RLE Strength: WFL  LLE Strength: WFL    Activities of Daily Living  Eating: ind hand to mouth  Grooming: Min A  Bathing: NT  UE Dressing: Min A  LE Dressing: increased time and cues for socks  Toileting: foley    Functional Mobility:  Supine to Sit: Min A  Sit to Stand: Min A  Transfers: Min A with FWW to chair, cues for directions and sequencing of tasks    PMP Activity: Step 5 - Chair      Balance  Static Sitting: good  Dynamic Sitting: fair  Static Standing:  fair  Dynamic Standing: fair -    Participation and Activity Tolerance  Participation Effort: good  Endurance: fair     Patient left with call bell within reach, all needs met, SCDs off as found, fall mat in place, chair alarm activated and all questions answered. RN notified of session outcome and patient response.       Goals:  Time For Goal Achievement: 5 visits  ADL Goals  Patient  will groom self: Supervision, at sinkside  Patient will dress upper body: Supervision  Patient will dress lower body: Supervision  Patient will toilet: Supervision  Mobility and Transfer Goals  Pt will perform functional transfers: Supervision        Executive Fucntion Goals  Pt will demonstrate motor planning: sequencing, problem solving, to increase ability to complete ADLs  Pt will demonstrate safety: instituting safety techniques, using safe hand placement, to increase ability to complete ADLs                Bard Herbert OTR/L  Pager # [phone]       Time of treatment:   OT Received On: 08/23/18  Start Time: 0922  Stop Time: 0950  Time Calculation (min): 28 min

## 2018-08-23 NOTE — PT Eval Note (Signed)
[facility]   Physical Therapy Evaluation   [name]    MRN#: [id number]   [facility]  Bed: FI155/FI155-01     POST ACUTE CARE THERAPY RECOMMENDATIONS:      Discharge Recommendation: Acute Rehab     Milestones to be reached to achieve recommendation: none      DME Recommendations: TBD at rehab     Therapy discharge recommendations may change with patient status. Please refer to most recent note for up-to-date recommendations.     ASSESSMENT:     Significant Findings: none    Tara Perry is a 73 y.o. female admitted 08/16/2018.  Patient in bed upon arrival, amenable to physical therapy evaluation. Patient performed all functional mobility with Min assist, standing to FWW and taking several steps to bedside recliner. Patient presents with notable weakness in BLE from prolonged bedrest. Patient is very motivated with physical therapy and will tolerate 3 hours of therapy per day. Recommend discharge to acute rehab in order to maximize independence prior to returning home.    Impairments: decreased activity tolerance, gait impairment, decreased strength, impaired balance, impaired bed mobility and decreased stamina    Therapy Diagnosis: impaired functional mobility    Rehabilitation Potential: good    Treatment Activities: Evaluation, therapeutic exercise    Educated the patient to role of physical therapy, plan of care, goals of therapy and safety with mobility and ADLs, HEP.     PLAN:   Treatment/Interventions: Exercise, Gait training, Stair training, Neuromuscular re-education, Functional transfer training, LE strengthening/ROM, Endurance training, Patient/family training, Equipment eval/education, Bed mobility     PT Frequency: 3-4x/wk   Risks/Benefits/POC Discussed with Pt/Family: With patient        Precautions and Contraindications:    Falls    Consult received for Tara Perry for PT Evaluation and Treatment.  Patients medical condition is appropriate for  Physical therapy intervention at this time.     HISTORY OF PRESENT ILLNESS:   Per H&P:  Tara Perry is a 73 y.o. female admitted on 08/16/2018 with cardiac arrest.    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 Nursing clears patient for therapy.     Patient goal: return to PLOF    Pain Assessment:  No report of pain     OBJECTIVE:   Observation of Patient:   Patient with telemetry, peripheral IV in place.    Cognition/Neuro Status:  Behavior: Cooperative, Pleasant  Orientation Level: Person, Place , Time and Situation  Safety Awareness: minimal verbal instruction    Musculoskeletal Examination:  Gross ROM  RUE: within normal limits  LUE:  within normal limits  RLE: within normal limits  LLE: within normal limits    Gross Strength  RUE: 4/5  LUE: 4/5  RLE: 4/5  LLE: 4/5    Functional Mobility:  PMP - Progressive Mobility Protocol   PMP Activity: Step 5 - Chair    Bed Mobility:     Supine to Sit: Minimal assistance    Transfers:  Sit to Stand: Minimal assistance  Stand to Sit: Minimal assistance    Ambulation:  Distance: 2 Feet  Device: Front wheel walker  Assist Level: Minimal assistance  Pattern: decreased pace, decreased cadence, shuffle    Balance:  Static sitting: good   Dynamic sitting: fair   Static standing:  fair   Dynamic standing: fair     Sensation:  Right UE: intact   Left UE: intact   Right LE: intact   Left LE: intact     Participation and Activity Tolerance:  Participation Effort: good  Patient Endurance: fair    Patient left with call bell within reach, all needs met, and all questions answered     SCD's off (as found)   Floor mat in place   Chair alarm in place    Goals:   Goals  Goal Formulation: With patient  Time for Goal Acheivement: 5 visits  Goals: Select goal  Pt Will Go Supine To Sit: with supervision  Pt Will Perform Sit to Stand: with supervision  Pt Will Ambulate: > 200 feet, with rolling walker, with supervision    Time of treatment:   PT Received On: 08/23/18  Start Time: 0830  Stop Time: 0900  Time Calculation (min): 30 min    Daleen Squibb, PT, DPT

## 2018-08-24 DIAGNOSIS — H3321 Serous retinal detachment, right eye: Secondary | ICD-10-CM

## 2018-08-24 HISTORY — DX: Serous retinal detachment, right eye: H33.21

## 2018-08-24 LAB — CBC AND DIFFERENTIAL
Absolute NRBC: 0 10*3/uL (ref 0.00–0.00)
Basophils Absolute Automated: 0.06 10*3/uL (ref 0.00–0.08)
Basophils Automated: 0.5 %
Eosinophils Absolute Automated: 0.15 10*3/uL (ref 0.00–0.44)
Eosinophils Automated: 1.4 %
Hematocrit: 30.9 % — ABNORMAL LOW (ref 34.7–43.7)
Hgb: 10.1 g/dL — ABNORMAL LOW (ref 11.4–14.8)
Immature Granulocytes Absolute: 0.17 10*3/uL — ABNORMAL HIGH (ref 0.00–0.07)
Immature Granulocytes: 1.5 %
Lymphocytes Absolute Automated: 2.04 10*3/uL (ref 0.42–3.22)
Lymphocytes Automated: 18.4 %
MCH: 29.5 pg (ref 25.1–33.5)
MCHC: 32.7 g/dL (ref 31.5–35.8)
MCV: 90.4 fL (ref 78.0–96.0)
MPV: 9.6 fL (ref 8.9–12.5)
Monocytes Absolute Automated: 1.71 10*3/uL — ABNORMAL HIGH (ref 0.21–0.85)
Monocytes: 15.4 %
Neutrophils Absolute: 6.98 10*3/uL — ABNORMAL HIGH (ref 1.10–6.33)
Neutrophils: 62.8 %
Nucleated RBC: 0 /100 WBC (ref 0.0–0.0)
Platelets: 275 10*3/uL (ref 142–346)
RBC: 3.42 10*6/uL — ABNORMAL LOW (ref 3.90–5.10)
RDW: 13 % (ref 11–15)
WBC: 11.11 10*3/uL — ABNORMAL HIGH (ref 3.10–9.50)

## 2018-08-24 LAB — GLUCOSE WHOLE BLOOD - POCT
Whole Blood Glucose POCT: 165 mg/dL — ABNORMAL HIGH (ref 70–100)
Whole Blood Glucose POCT: 343 mg/dL — ABNORMAL HIGH (ref 70–100)
Whole Blood Glucose POCT: 225 mg/dL — ABNORMAL HIGH (ref 70–100)
Whole Blood Glucose POCT: 190 mg/dL — ABNORMAL HIGH (ref 70–100)

## 2018-08-24 LAB — BASIC METABOLIC PANEL
BUN: 42 mg/dL — ABNORMAL HIGH (ref 7.0–19.0)
CO2: 16 mEq/L — ABNORMAL LOW (ref 22–29)
Calcium: 8.7 mg/dL (ref 7.9–10.2)
Chloride: 115 mEq/L — ABNORMAL HIGH (ref 100–111)
Creatinine: 1.1 mg/dL — ABNORMAL HIGH (ref 0.6–1.0)
Glucose: 179 mg/dL — ABNORMAL HIGH (ref 70–100)
Potassium: 3.4 mEq/L — ABNORMAL LOW (ref 3.5–5.1)
Sodium: 145 mEq/L (ref 136–145)

## 2018-08-24 LAB — HEMOGLOBIN A1C
Average Estimated Glucose: 139.9 mg/dL
Hemoglobin A1C: 6.5 % — ABNORMAL HIGH (ref 4.6–5.9)

## 2018-08-24 LAB — MAGNESIUM: Magnesium: 2.1 mg/dL (ref 1.6–2.6)

## 2018-08-24 LAB — GFR: EGFR: 48.6

## 2018-08-24 MED ORDER — INSULIN LISPRO 100 UNIT/ML SC SOLN
1.00 [IU] | Freq: Three times a day (TID) | SUBCUTANEOUS | Status: DC
Start: 2018-08-24 — End: 2018-08-27
  Administered 2018-08-24: 12:00:00 4 [IU] via SUBCUTANEOUS
  Administered 2018-08-24 – 2018-08-25 (×3): 2 [IU] via SUBCUTANEOUS
  Administered 2018-08-25: 09:00:00 1 [IU] via SUBCUTANEOUS
  Administered 2018-08-26 – 2018-08-27 (×3): 2 [IU] via SUBCUTANEOUS
  Administered 2018-08-27: 12:00:00 3 [IU] via SUBCUTANEOUS
  Filled 2018-08-24: qty 6
  Filled 2018-08-24: qty 9
  Filled 2018-08-24: qty 6
  Filled 2018-08-24: qty 3
  Filled 2018-08-24: qty 6
  Filled 2018-08-24 (×2): qty 3
  Filled 2018-08-24 (×2): qty 6

## 2018-08-24 MED ORDER — CARVEDILOL 3.125 MG PO TABS
3.1250 mg | ORAL_TABLET | Freq: Two times a day (BID) | ORAL | Status: DC
Start: 2018-08-24 — End: 2018-08-24
  Administered 2018-08-24: 10:00:00 3.125 mg via ORAL
  Filled 2018-08-24: qty 1

## 2018-08-24 MED ORDER — INSULIN GLARGINE 100 UNIT/ML SC SOLN
10.00 [IU] | Freq: Once | SUBCUTANEOUS | Status: AC
Start: 2018-08-24 — End: 2018-08-24
  Administered 2018-08-24: 11:00:00 10 [IU] via SUBCUTANEOUS
  Filled 2018-08-24: qty 10

## 2018-08-24 MED ORDER — CARVEDILOL 3.125 MG PO TABS
3.1250 mg | ORAL_TABLET | Freq: Once | ORAL | Status: AC
Start: 2018-08-24 — End: 2018-08-24
  Administered 2018-08-24: 11:00:00 3.125 mg via ORAL
  Filled 2018-08-24: qty 1

## 2018-08-24 MED ORDER — FUROSEMIDE 10 MG/ML IJ SOLN
20.00 mg | Freq: Once | INTRAMUSCULAR | Status: AC
Start: 2018-08-24 — End: 2018-08-24
  Administered 2018-08-24: 11:00:00 20 mg via INTRAVENOUS
  Filled 2018-08-24: qty 4

## 2018-08-24 MED ORDER — POTASSIUM CHLORIDE 20 MEQ PO PACK
40.00 meq | PACK | Freq: Once | ORAL | Status: AC
Start: 2018-08-24 — End: 2018-08-24
  Administered 2018-08-24: 17:00:00 40 meq via ORAL
  Filled 2018-08-24: qty 2

## 2018-08-24 MED ORDER — INSULIN LISPRO 100 UNIT/ML SC SOLN
1.00 [IU] | Freq: Every evening | SUBCUTANEOUS | Status: DC
Start: 2018-08-24 — End: 2018-08-27
  Administered 2018-08-24: 22:00:00 1 [IU] via SUBCUTANEOUS
  Administered 2018-08-25: 22:00:00 2 [IU] via SUBCUTANEOUS
  Administered 2018-08-26: 22:00:00 3 [IU] via SUBCUTANEOUS
  Filled 2018-08-24: qty 3
  Filled 2018-08-24: qty 9
  Filled 2018-08-24: qty 6

## 2018-08-24 MED ORDER — POTASSIUM CHLORIDE CRYS ER 20 MEQ PO TBCR
40.00 meq | EXTENDED_RELEASE_TABLET | Freq: Once | ORAL | Status: AC
Start: 2018-08-24 — End: 2018-08-24
  Administered 2018-08-24: 12:00:00 40 meq via ORAL
  Filled 2018-08-24: qty 2

## 2018-08-24 MED ORDER — DEXTROSE 50 % IV SOLN
12.50 g | INTRAVENOUS | Status: DC | PRN
Start: 2018-08-24 — End: 2018-08-27

## 2018-08-24 MED ORDER — GLUCOSE 40 % PO GEL
15.00 g | ORAL | Status: DC | PRN
Start: 2018-08-24 — End: 2018-08-27

## 2018-08-24 MED ORDER — POTASSIUM CHLORIDE 20 MEQ PO PACK
40.00 meq | PACK | Freq: Once | ORAL | Status: AC
Start: 2018-08-24 — End: 2018-08-24
  Administered 2018-08-24: 06:00:00 40 meq via ORAL
  Filled 2018-08-24: qty 2

## 2018-08-24 MED ORDER — POTASSIUM CHLORIDE 20 MEQ PO PACK
40.00 meq | PACK | Freq: Once | ORAL | Status: DC
Start: 2018-08-24 — End: 2018-08-25

## 2018-08-24 MED ORDER — GLUCAGON 1 MG IJ SOLR (WRAP)
1.00 mg | INTRAMUSCULAR | Status: DC | PRN
Start: 2018-08-24 — End: 2018-08-27

## 2018-08-24 MED ORDER — CARVEDILOL 6.25 MG PO TABS
6.2500 mg | ORAL_TABLET | Freq: Two times a day (BID) | ORAL | Status: DC
Start: 2018-08-24 — End: 2018-08-25
  Administered 2018-08-24 – 2018-08-25 (×2): 6.25 mg via ORAL
  Filled 2018-08-24 (×2): qty 1

## 2018-08-24 MED ORDER — SACUBITRIL-VALSARTAN 24-26 MG PO TABS
24.0000 mg | ORAL_TABLET | Freq: Two times a day (BID) | ORAL | Status: DC
Start: 2018-08-24 — End: 2018-08-25
  Administered 2018-08-24 – 2018-08-25 (×3): 1 via ORAL
  Filled 2018-08-24 (×2): qty 2
  Filled 2018-08-24: qty 1

## 2018-08-24 MED ORDER — INSULIN GLARGINE 100 UNIT/ML SC SOLN
14.00 [IU] | Freq: Every morning | SUBCUTANEOUS | Status: DC
Start: 2018-08-25 — End: 2018-08-27
  Administered 2018-08-25 – 2018-08-27 (×3): 14 [IU] via SUBCUTANEOUS
  Filled 2018-08-24 (×3): qty 14

## 2018-08-24 NOTE — Progress Notes (Signed)
AOX4. HR80-90 . SBP 150-160. Normal sinus rhythm w/BBB. Sats are WNL on RA. Afebrile. Patient denies pain. Patient denies cardiac signs and symptoms    Patient's safety precaution included: bed alarm on, floor mat in place, bed in low locked position, patient belongings and call bell in reach. Patient x2 assist with walker. Pt moderate assist with ADLs. Hourly rounding completed.      Plan  Biventricular ICD implant on Monday 6/15

## 2018-08-24 NOTE — Progress Notes (Signed)
[place] [facility] PROGRESS NOTE  [facility]      Date Time: 08/24/18 11:10 AM  Patient Name: Tara Perry, Tara Perry  Medical Record #:  [id number]  Account#:  [id number]  Admission Date:  08/16/2018         Patient Active Problem List   Diagnosis    Peritonitis    Viral cardiomyopathy    Type II diabetes mellitus without mention of complication    Gout synovitis    Pulmonary edema    Type 2 diabetes mellitus without complications    SIRS (systemic inflammatory response syndrome)    Viral cardiomyopathy    Dyspnea    CHF (congestive heart failure)    Cardiac arrest       Subjective:   Denies chest pain, SOB or palpitations.    Assessment:    VF arrest, witnessed with bystander CPR performed, electrically resuscitated with 3 defibrillations, 1 ampule of epinephrine given   NICM: EF 20% on 08/19/2018   Cardiac cath 08-21-18  - no CAD   Seen by EP- BiVICD recommended   Elevated troponin 0.6 demand ischemia   LBBB, chronic   Hypothermia protocol completed on 08/19/2018   Acute kidney injury   Diabetes mellitus   Lekocytosis(pneumonia): bcx No growth so far  Recommendations:    Mild insp crackles, agree w/ 1 time lasix today   Will restart entresto at 24/26 bid Cr1.3->1.1   Will follow    Medications:      Scheduled Meds:    atorvastatin, 10 mg, Oral, Daily  carvedilol, 3.125 mg, Oral, Once  carvedilol, 6.25 mg, Oral, Q12H SCH  furosemide, 20 mg, Intravenous, Once  heparin (porcine), 5,000 Units, Subcutaneous, Q12H SCH  insulin glargine, 10 Units, Subcutaneous, Once  [START ON 08/25/2018] insulin glargine, 14 Units, Subcutaneous, QAM  insulin lispro, 1-3 Units, Subcutaneous, QHS  insulin lispro, 1-5 Units, Subcutaneous, TID AC  lidocaine, 2 patch, Transdermal, Q24H  potassium chloride, 40 mEq, Oral, Once  potassium chloride, 40 mEq, Oral, Once        Continuous Infusions:           Physical Exam:       VITAL SIGNS PHYSICAL EXAM   Vitals:    08/24/18 0944   BP: 172/79   Pulse: (!) 107   Resp:    Temp:    SpO2:       Temp (24hrs), Avg:97.9 F (36.6 C), Min:97.3 F (36.3 C), Max:98.6 F (37 C)      Telemetry: Reviewed no changes      Intake/Output Summary (Last 24 hours) at 08/24/2018 1110  Last data filed at 08/24/2018 0600  Gross per 24 hour   Intake 0 ml   Output 1575 ml   Net -1575 ml    Physical Exam  General: awake, alert, breathing comfortably, no acute distress  Head: normocephalic  Eyes: EOM's intact  Cardiovascular: regular rate and rhythm, normal S1, S2, no S3, no S4, no murmurs, rubs or gallops  Neck: no carotid bruits or JVD  Lungs: Inspiratory crackles at bilateral lung bases.  Abdomen: soft, non-tender, non-distended; no palpable masses,  normoactive bowel sounds  Extremities: no edema  Pulse: equal pulses, 4/4 symmetric  Neurological: Alert and oriented X3, mood and affect normal  Musculoskeletal: normal strength and tone         Labs:     Recent Labs   Lab 08/21/18  0810 08/21/18  0118 08/20/18  2138   Troponin I 0.60* 0.19* 0.06*  Recent Labs   Lab 08/22/18  0431   Bilirubin, Total 0.5   Bilirubin, Direct 0.3   Protein, Total 5.5*   Albumin 2.8*   ALT 34   AST (SGOT) 22     Recent Labs   Lab 08/24/18  0437   Magnesium 2.1     Recent Labs   Lab 08/18/18  2257 08/18/18  1533   PT  --  13.8   PT INR  --  1.1   PTT 34 28     Recent Labs   Lab 08/24/18  0437 08/23/18  0413 08/22/18  0431   WBC 11.11* 9.75* 9.39   Hgb 10.1* 10.3* 9.3*   Hematocrit 30.9* 30.5* 27.2*   Platelets 275 252 216     Recent Labs   Lab 08/24/18  0437 08/23/18  0413 08/22/18  0431   Sodium 145 146* 142   Potassium 3.4* 3.6 3.6   Chloride 115* 115* 114*   CO2 16* 15* 18*   BUN 42.0* 40.0* 34.0*   Creatinine 1.1* 1.1* 1.3*   EGFR 48.6 48.6 40.0   Glucose 179* 210* 165*   Calcium 8.7 8.5 8.0           Invalid input(s): FREET4    .  Lab Results   Component Value Date    BNP 139 (H) 08/16/2018        Weight Monitoring 08/20/2018 08/20/2018 08/20/2018 08/21/2018 08/22/2018 08/23/2018 08/24/2018   Height 162.6 cm 162.6 cm - - - - -   Height  Method - - - - - - -   Weight - - 67.8 kg 71.3 kg 67.5 kg 66.2 kg 64.501 kg   Weight Method - - - - Bed Scale - Bed Scale   BMI (calculated) - - - - - - -       Imaging:       ____________________________________________    Signed by: Samantha Crimes, MD      [facility]  NP Spectralink [phone] (8am-5pm)  MD Spectralink [phone] or [phone] (8am-5pm)  Arrhythmia Spectralink [phone] (8am-4:30pm)  After hours, non urgent consult line [phone]  After Hours, urgent consults [phone]

## 2018-08-24 NOTE — Progress Notes (Signed)
MEDICINE TRANSFER ACCEPT NOTE    Date Time: 08/24/18 7:12 AM  Patient Name: Tara Perry  Attending Physician: Dani Gobble, MD    Hospital Course/Interim Summary:     [phone] with h/o DMII, HFrEFpresented 6/5 via EMS after witnessed ventricular fibrillation arrest at home.  She received 3 shocks, 1 round of epi with successful ROSC.  She was intubated for airway protection and underwent therapeutic hypothermia protocol due to poor mental status.  Echocardiogram showed depressed left ventricular ejection fraction of 20%.  She was rewarmed 6/8 and showed neurologic improvement, extubated 6/9.  Left heart catheterization 6/10 showed no significant coronary artery disease.  Patient was stepped down to medicine floor 6/12.  Assessment:     1. Sudden cardiac death, status post ventricular fibrillation arrest  2. 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 Elevated troponin, demand ischemia from above.  No CAD on left heart cath  4. Hypoxic/anoxic encephalopathy, improving  5. Acute hypoxic respiratory failure status post intubation for airway protection  6. Dysphagia secondary to encephalopathy  7. T2DM (non-insulin dependent prior to admission) - A1C 6.5%  8. Hypernatremia from inability to drink to thirst/dysphagia  9. AKI on CKD III, resolved (hypoperfusion injury related to cardiac death)  10. AGMA from fasting ketosis  11. Urinary retention after initial incontinence    Plan:    Planning for biventricular ICD placement Monday 6/15 for secondary prevention   [place] heart following.   Lasix 20mg  IV x 1 now   Increase coreg to 6.25mg  BID   Plan to start Entresto today.   Cont lipitor 10mg    Foley in place, voiding trial tomorrow.        Safety Checklist:   DVT Prophylaxis: SC heparin  Nutrition: mechanical soft diet  Code Status: Full  Lines:     Patient Lines/Drains/Airways Status    Active PICC Line / CVC Line / PIV Line / Drain / Airway /  Intraosseous Line / Epidural Line / ART Line / Line / Wound / Pressure Ulcer / NG/OG Tube     Name:   Placement date:   Placement time:   Site:   Days:    Peripheral IV 08/17/18 Left Forearm   08/17/18    --    Forearm   6    Urethral Catheter   08/22/18    2100    --   less than 1    Puncture Site 08/21/18 Arterial Right Arm   08/21/18    1154     2                 Disposition:     Today's date: 08/24/2018   Admit Date: 08/16/2018 11:32 PM  Reason for ongoing hospitalization: cardiac death  Anticipated discharge needs: TBD    Subjective     CC: Cardiac arrest    Interval History/24 hour events/HPI/Subjective: Stepped down form CICU 6/12. Feels well today, notes weakness and some forgetfulness but otherwise no complaints.     Review of Systems:     Negative except per HPI    Physical Exam:     VITAL SIGNS PHYSICAL EXAM   Temp:  [97.3 F (36.3 C)-98.8 F (37.1 C)] 97.4 F (36.3 C)  Heart Rate:  [87-97] 91  Resp Rate:  [9-24] 20  BP: (148-174)/(71-81) 148/71        Intake/Output Summary (Last 24 hours) at 08/24/2018 0981  Last data filed at 08/24/2018 0600  Gross per 24 hour   Intake 0 ml   Output 1650 ml   Net -1650 ml    Physical Exam  General appearance: AOx3, NAD  Cardiovascular: RRR,no mrg  Lungs: Bibasilar rales  Abdomen: soft, non-tender, non-distended; no palpable masses,  normoactive bowel sounds  Extremities: trace ble edema         Meds:     Medications were reviewed.    Labs:     Labs (last 72 hours):    Recent Labs   Lab 08/24/18  0437 08/23/18  0413   WBC 11.11* 9.75*   Hgb 10.1* 10.3*   Hematocrit 30.9* 30.5*   Platelets 275 252       Recent Labs   Lab 08/18/18  2257 08/18/18  1533 08/18/18  0824   PT  --  13.8 14.1   PT INR  --  1.1 1.1   PTT 34 28 30       Microbiology, reviewed and are significant for:  Microbiology Results     Procedure Component Value Units Date/Time    Blood Culture Aerobic/Anaerobic #1 [[id number]] Collected:  08/17/18 0105    Specimen:  Arm from Blood, Venipuncture Updated:   08/22/18 0621    Narrative:       ORDER#: [id number]                                    ORDERED BY: Effie Shy, PATRIC  SOURCE: Blood, Venipuncture steripath                COLLECTED:  08/17/18 01:05  ANTIBIOTICS AT COLL.:                                RECEIVED :  08/17/18 03:28  Culture Blood Aerobic and Anaerobic        FINAL       08/22/18 06:21  08/22/18   No growth after 5 days of incubation.      Blood Culture Aerobic/Anaerobic #2 [[id number]] Collected:  08/17/18 0216    Specimen:  Arm from Blood, Venipuncture Updated:  08/22/18 0621    Narrative:       ORDER#: [id number]                                    ORDERED BY: Effie Shy, PATRIC  SOURCE: Blood, Venipuncture steripath  L Ac          COLLECTED:  08/17/18 02:16  ANTIBIOTICS AT COLL.:                                RECEIVED :  08/17/18 03:28  Culture Blood Aerobic and Anaerobic        FINAL       08/22/18 06:21  08/22/18   No growth after 5 days of incubation.      COVID-19 (SARS-CoV-2) [facility] Rapid) [[id number]] Collected:  08/17/18 0157    Specimen:  Nasopharyngeal Swab Updated:  08/17/18 0323     Purpose of COVID testing Screening     SARS-CoV-2 Specimen Source Nasopharyngeal     SARS CoV 2 Overall Result Negative     Comment: Test performed using the Abbott ID NOW EUA assay.  Please see Fact Sheets  for patients and providers located at:  http://www.rice.biz/  This test is for the qualitative detection of SARS-CoV-2  (COVID-19)nucleic acid. Viral nucleic acids may persist in vivo,  independent of viability. Detection of viral nucleic acid does  not imply the presence of infectious virus, or that virus  nucleic acid is the cause of clinical symptoms.  Negative results do not preclude SARS-CoV-2 infection and  should not be used as the sole basis for patient management  decisions.  Invalid results may be due to inhibiting substances in the  specimen and recollection should occur.         Narrative:       o Collect and clearly label specimen  type:  o Upper respiratory specimen: One Nasopharyngeal Dry Swab NO  Transport Media.  o Hand deliver to laboratory ASAP    CULTURE BLOOD AEROBIC AND ANAEROBIC [[id number]] Collected:  08/19/18 1505    Specimen:  Arterial Blood Updated:  08/22/18 1921    Narrative:       The order will result in two separate 8-99ml bottles  Please do NOT order repeat blood cultures if one has been  drawn within the last 48 hours.  AVOID BLOOD CULTURE DRAWS FROM CENTRAL LINE IF POSSIBLE  Indications:->Fever of greater than 101.5  ORDER#: [id number]                                    ORDERED BY: Gorden Harms  SOURCE: Arterial Blood                               COLLECTED:  08/19/18 15:05  ANTIBIOTICS AT COLL.:                                RECEIVED :  08/19/18 18:45  Culture Blood Aerobic and Anaerobic        PRELIM      08/22/18 19:21  08/20/18   No Growth after 1 day/s of incubation.  08/21/18   No Growth after 2 day/s of incubation.  08/22/18   No Growth after 3 day/s of incubation.      CULTURE BLOOD AEROBIC AND ANAEROBIC [[id number]] Collected:  08/19/18 1505    Specimen:  Blood, Venipuncture Updated:  08/22/18 1921    Narrative:       The order will result in two separate 8-77ml bottles  Please do NOT order repeat blood cultures if one has been  drawn within the last 48 hours.  AVOID BLOOD CULTURE DRAWS FROM CENTRAL LINE IF POSSIBLE  Indications:->Fever of greater than 101.5  ORDER#: [id number]                                    ORDERED BY: Gorden Harms  SOURCE: Blood, Venipuncture                          COLLECTED:  08/19/18 15:05  ANTIBIOTICS AT COLL.:                                RECEIVED :  08/19/18 18:45  Culture Blood Aerobic and Anaerobic  PRELIM      08/22/18 19:21  08/20/18   No Growth after 1 day/s of incubation.  08/21/18   No Growth after 2 day/s of incubation.  08/22/18   No Growth after 3 day/s of incubation.      MRSA culture - Nares (If not done in triage) [[id number]] Collected:  08/17/18 0621     Specimen:  Culturette from Nares Updated:  08/18/18 0255     Culture MRSA Surveillance Negative for Methicillin Resistant Staph aureus    MRSA culture - Throat (If not done in triage) [[id number]] Collected:  08/17/18 0621    Specimen:  Culturette from Throat Updated:  08/18/18 0255     Culture MRSA Surveillance Negative for Methicillin Resistant Staph aureus            Imaging, reviewed and are significant for:  Xr Chest Ap Portable    Result Date: 08/19/2018  Lines and tubes as described without pneumothorax. Bibasilar atelectasis without focal consolidation identified. Colonel Bald, MD  08/19/2018 12:35 PM    Xr Chest Ap Portable    Result Date: 08/17/2018   Right IJ central venous catheter has been pulled back with tip now at the cavoatrial junction. Shelly Flatten, MD  08/17/2018 6:29 PM    Xr Chest Ap Portable    Result Date: 08/17/2018   1. Right IJ central venous catheter placement with tip in the right atrium. 2. Retrocardiac opacity may represent atelectasis or effusion. Shelly Flatten, MD  08/17/2018 5:28 PM       Recent Labs   Lab 08/24/18  0437 08/23/18  0413 08/22/18  0431  08/21/18  0118   Sodium 145 146* 142  More results in Results Review 142   Potassium 3.4* 3.6 3.6  More results in Results Review 3.5   Chloride 115* 115* 114*  More results in Results Review 111   CO2 16* 15* 18*  More results in Results Review 23   BUN 42.0* 40.0* 34.0*  More results in Results Review 23.0*   Creatinine 1.1* 1.1* 1.3*  More results in Results Review 1.1*   Calcium 8.7 8.5 8.0  More results in Results Review 8.3   Albumin  --   --  2.8*  --  2.7*   Protein, Total  --   --  5.5*  --  5.3*   Bilirubin, Total  --   --  0.5  --  0.6   Alkaline Phosphatase  --   --  95  --  94   ALT  --   --  34  --  37   AST (SGOT)  --   --  22  --  30   Glucose 179* 210* 165*  More results in Results Review 127*   More results in Results Review = values in this interval not displayed.        Signed by: Dani Gobble, MD

## 2018-08-24 NOTE — Progress Notes (Signed)
Report received from [facility] off going nurse. Patient received in bed awake and alert watching tv with zero c/o pain and zero s/s d distress.     Neuro:A&Ox 4  Vital Signs/Rhythm & Rate: WNL  Cardiac symptoms: None   Respiratory  RA, diminished lung sounds  GI/GU/diet: Dysphagia/mechanical soft with nectar thick liquids. Continent of bowel and bladder noted with a 16 fr Foley cath  Integumentary: intact with scattered bruising.   Lines/Drains/Drips: 22 g LFA  Mobility: up x 1 with walker/ generalized weakness noted with unsteady gait.     Pertinent Labs/Procedures/Imaging: WBC 11.11, Creat 1.1, K+ 3.4    Significant Shift Events None    Plan: AICD on Monday

## 2018-08-24 NOTE — Plan of Care (Signed)
Neuro: A&Ox4  Vital Signs/Rhythm & Rate: SR on tele, SBP upto 170s, Coreg increased, Entresto initiated, x1 1 IV lasix given.  Cardiac symptoms: c/o chest pain (from CPR), lidocaine patch applied), denies palpitations and dizziness  Respiratory Denies SOB, on RA   GI/GU/diet: Tolerating nectar thick fluids and mech altered foods  Integumentary: R rad puncture site remained c/d/I, all pulses intact and palpable  Mobility: x1 assist with walker    Plan:    -ICD insert on 06/15    Problem: Safety  Goal: Patient will be free from injury during hospitalization  Outcome: Progressing  Flowsheets (Taken 08/24/2018 1323)  Patient will be free from injury during hospitalization:   Assess patient's risk for falls and implement fall prevention plan of care per policy   Ensure appropriate safety devices are available at the bedside   Assess for patients risk for elopement and implement Elopement Risk Plan per policy   Provide alternative method of communication if needed (communication boards, writing)   Include patient/ family/ care giver in decisions related to safety   Provide and maintain safe environment   Use appropriate transfer methods   Hourly rounding     Problem: Psychosocial and Spiritual Needs  Goal: Demonstrates ability to cope with hospitalization/illness  Outcome: Progressing  Flowsheets (Taken 08/24/2018 1323)  Demonstrates ability to cope with hospitalizations/illness:   Encourage verbalization of feelings/concerns/expectations   Include patient/ patient care companion in decisions   Encourage patient to set small goals for self   Encourage participation in diversional activity   Communicate referral to spiritual care as appropriate   Provide quiet environment   Assist patient to identify own strengths and abilities   Reinforce positive adaptation of new coping behaviors     Problem: Hemodynamic Status: Cardiac  Goal: Stable vital signs and fluid balance  Outcome: Progressing  Flowsheets (Taken 08/24/2018  1323)  Stable vital signs and fluid balance:   Monitor/assess vital signs and telemetry per unit protocol   Assess signs and symptoms associated with cardiac rhythm changes   Monitor for leg swelling/edema and report to LIP if abnormal   Monitor intake/output per unit protocol and/or LIP order   Weigh on admission and record weight daily   Monitor lab values

## 2018-08-24 NOTE — PT Progress Note (Signed)
[facility]   Physical Therapy Treatment  Patient:  Tara Perry MRN#:  [id number]  [facility]  Bed: FI155/FI155-01     POST ACUTE CARE THERAPY RECOMMENDATIONS:      Discharge Recommendations: Acute Rehab     Milestones to be reached to achieve recommendation: none      DME Recommendations: TBD at rehab     If the above recommended discharge disposition is not available, patient will need Minimal assistance for functional mobility, Front wheeled walker, Shower chair and Wheelchair, and HHPT.     Therapy discharge recommendations may change with patient status. Please refer to most recent note for up-to-date recommendations.     ASSESSMENT:     Significant Findings: none    Patient in bed upon arrival, amenable to physical therapy treatment session. Patient performed most functional mobility with Min assist, ambulating 43ft x2 with FWW, seated rest break in between bouts. Patient tangential and has difficulty focusing on task at hand. Patient required cues to take big steps, as patient has a tendency to shuffle. Patient is very motivated with physical therapy and will tolerate 3 hours of therapy per day. Recommend discharge to acute rehab in order to maximize independence prior to returning home.    Treatment Activities: gait training, therapeutic exercise    Educated the patient to role of physical therapy, plan of care, goals of therapy and safety with mobility and ADLs, HEP.     PLAN:   Treatment/Interventions: Exercise, Gait training, Stair training, Neuromuscular re-education, Functional transfer training, LE strengthening/ROM, Endurance training, Patient/family training, Equipment eval/education, Bed mobility    PT Frequency: 3-4x/wk    Continue plan of care.       Precautions and Contraindications:    Falls     SUBJECTIVE: "You still look like my cousin Renae Fickle"   Patient's medical condition is appropriate for Physical Therapy intervention at this time.  Patient is  agreeable to participation in the therapy session. Nursing clears patient for therapy.    Pain Assessment:  No report of pain     OBJECTIVE:   Observation of Patient:   Patient with telemetry in place.    Functional Mobility:  PMP - Progressive Mobility Protocol   PMP Activity: Step 6 - Walks in Room  Distance Walked (ft) (Step 6,7): 25 Feet    Bed Mobility:     Supine to Sit: Minimal assistance    Transfers:  Sit to Stand: Minimal assistance  Stand to Sit: Minimal assistance    Ambulation:  Distance: 25 Feet x2 (seated rest break in between bouts)  Device: Front wheel walker  Assist Level: Minimal assistance  Pattern: decreased pace, decreased cadence, forward flexed posture, shuffle    Therex (Seated):  Ankle pumps: 20 reps bilaterally  LAQ's: 20 reps bilaterally  Hip flexion: 20 reps bilaterally    Balance:  Static sitting: good   Dynamic sitting: good   Static standing: fair   Dynamic standing: fair     Patient Participation: good  Patient Endurance: fair    Patient left with call bell within reach, all needs met, and all questions answered     SCD's off (as found)   Floor mat in place   Chair alarm in place    Goals:  Goals  Goal Formulation: With patient  Time for Goal Acheivement: 5 visits  Goals: Select goal  Pt Will Go Supine To Sit: with supervision  Pt Will Perform Sit to Stand: with supervision  Pt Will Ambulate: > 200 feet, with rolling walker, with supervision    Time of Treatment  PT Received On: 08/24/18  Start Time: 0730  Stop Time: 0810  Time Calculation (min): 40 min    Daleen Squibb, PT, DPT

## 2018-08-24 NOTE — OT Progress Note (Signed)
Occupational Therapy Note    [facility]   Occupational Therapy Treatment     [name]    MRN#: [id number]   [facility]  Bed: FI155/FI155-01      Post Acute Care Therapy Recommendations:   Discharge Recommendations: Acute Rehab     Milestones to be reached to achieve recommendation: None    DME Recommended for Discharge: (TBA @ rehab)    If Acute Rehab  recommended discharge disposition is not available, patient will need hands on assist with ADLs and mobility, a FWW, shower chair, BSC, W/C for distances and home health therapies.    Therapy discharge recommendations may change with patient status.  Please refer to most recent note for up-to-date recommendations.    Assessment:   Significant Findings: None    Pt in the chair when OT arrived - alert and agreeable to OT session. Pt making good progress with therapy, however continues to be limited by confusion, impaired processing and sequencing of tasks, generalized weakness, impaired balance and poor activity tolerance. Pt requires Min A for sit to stand transfers and mobility in room using FWW with cues for step length, directions and pacing. Pt able to perform ADLs with setup in chair. Pt would benefit from continued acute OT services.      Treatment Activities: ADLs, mobility    Educated the patient to role of occupational therapy, plan of care, goals of therapy and safety with mobility and ADLs.    Plan:    OT Frequency Recommended: 3-4x/wk     Continue plan of care.       Precautions and Contraindications:   Falls     Updated Medical Status/Imaging/Labs:  Reviewed     Subjective: "My family will help me."   Patient's medical condition is appropriate for Occupational Therapy intervention at this time.  Patient is agreeable to participation in the therapy session. Nursing clears patient for therapy.    Pain: No/Denies    Objective:   Patient is seated in a bedside chair with telemetry and peripheral IV in  place.      Cognition  Alert  Pleasant, cooperative  Decreased safety awareness and insight  Impaired processing and sequencing of tasks    Functional Mobility  Sit to Stand: Min A  Transfers: Min A with FWW    PMP Activity: Step 6 - Walks in Room    Balance  Static Sitting: good  Dynamic Sitting: fair +  Static Standing: fair  Dynamic Standing: fair -    Self Care and Home Management  Eating: ind hand to mouth  Grooming: setup in chair  Bathing: NT  Toileting: had BM on BSC prior to OT entry - tech reporting pt required Total A for peri-care    Participation: good  Endurance: fair, rest breaks prn    Patient left with call bell within reach, all needs met, SCDs off as found, fall mat up as found, chair alarm activated, RN in room and all questions answered. RN notified of session outcome and patient response.     Goals:  Time For Goal Achievement: 5 visits  ADL Goals  Patient will groom self: Supervision, at sinkside  Patient will dress upper body: Supervision  Patient will dress lower body: Supervision  Patient will toilet: Supervision  Mobility and Transfer Goals  Pt will perform functional transfers: Supervision        Executive Fucntion Goals  Pt will demonstrate motor planning: sequencing, problem  solving, to increase ability to complete ADLs  Pt will demonstrate safety: instituting safety techniques, using safe hand placement, to increase ability to complete ADLs                Bard Herbert OTR/L  Pager # [phone]    Time of Treatment  OT Received On: 08/24/18  Start Time: 0855  Stop Time: 0920  Time Calculation (min): 25 min    Treatment # 1 of 5 visits

## 2018-08-25 LAB — CBC AND DIFFERENTIAL
Absolute NRBC: 0 10*3/uL (ref 0.00–0.00)
Basophils Absolute Automated: 0.06 10*3/uL (ref 0.00–0.08)
Basophils Automated: 0.5 %
Eosinophils Absolute Automated: 0.39 10*3/uL (ref 0.00–0.44)
Eosinophils Automated: 3.2 %
Hematocrit: 32.6 % — ABNORMAL LOW (ref 34.7–43.7)
Hgb: 10.5 g/dL — ABNORMAL LOW (ref 11.4–14.8)
Immature Granulocytes Absolute: 0.14 10*3/uL — ABNORMAL HIGH (ref 0.00–0.07)
Immature Granulocytes: 1.1 %
Lymphocytes Absolute Automated: 2.32 10*3/uL (ref 0.42–3.22)
Lymphocytes Automated: 18.8 %
MCH: 29 pg (ref 25.1–33.5)
MCHC: 32.2 g/dL (ref 31.5–35.8)
MCV: 90.1 fL (ref 78.0–96.0)
MPV: 9.7 fL (ref 8.9–12.5)
Monocytes Absolute Automated: 1.54 10*3/uL — ABNORMAL HIGH (ref 0.21–0.85)
Monocytes: 12.5 %
Neutrophils Absolute: 7.91 10*3/uL — ABNORMAL HIGH (ref 1.10–6.33)
Neutrophils: 63.9 %
Nucleated RBC: 0 /100 WBC (ref 0.0–0.0)
Platelets: 287 10*3/uL (ref 142–346)
RBC: 3.62 10*6/uL — ABNORMAL LOW (ref 3.90–5.10)
RDW: 13 % (ref 11–15)
WBC: 12.36 10*3/uL — ABNORMAL HIGH (ref 3.10–9.50)

## 2018-08-25 LAB — GFR: EGFR: 43.9

## 2018-08-25 LAB — GLUCOSE WHOLE BLOOD - POCT
Whole Blood Glucose POCT: 197 mg/dL — ABNORMAL HIGH (ref 70–100)
Whole Blood Glucose POCT: 217 mg/dL — ABNORMAL HIGH (ref 70–100)
Whole Blood Glucose POCT: 285 mg/dL — ABNORMAL HIGH (ref 70–100)
Whole Blood Glucose POCT: 232 mg/dL — ABNORMAL HIGH (ref 70–100)
Whole Blood Glucose POCT: 284 mg/dL — ABNORMAL HIGH (ref 70–100)

## 2018-08-25 LAB — BASIC METABOLIC PANEL
BUN: 39 mg/dL — ABNORMAL HIGH (ref 7.0–19.0)
CO2: 19 mEq/L — ABNORMAL LOW (ref 22–29)
Calcium: 8.5 mg/dL (ref 7.9–10.2)
Chloride: 111 mEq/L (ref 100–111)
Creatinine: 1.2 mg/dL — ABNORMAL HIGH (ref 0.6–1.0)
Glucose: 214 mg/dL — ABNORMAL HIGH (ref 70–100)
Potassium: 3.6 mEq/L (ref 3.5–5.1)
Sodium: 143 mEq/L (ref 136–145)

## 2018-08-25 LAB — MAGNESIUM: Magnesium: 2 mg/dL (ref 1.6–2.6)

## 2018-08-25 MED ORDER — SPIRONOLACTONE 25 MG PO TABS
25.0000 mg | ORAL_TABLET | Freq: Every day | ORAL | Status: DC
Start: 2018-08-25 — End: 2018-08-27
  Administered 2018-08-25 – 2018-08-27 (×3): 25 mg via ORAL
  Filled 2018-08-25 (×3): qty 1

## 2018-08-25 MED ORDER — POTASSIUM CHLORIDE CRYS ER 20 MEQ PO TBCR
40.00 meq | EXTENDED_RELEASE_TABLET | Freq: Once | ORAL | Status: DC
Start: 2018-08-25 — End: 2018-08-26
  Filled 2018-08-25: qty 2

## 2018-08-25 MED ORDER — POTASSIUM CHLORIDE 20 MEQ PO PACK
40.00 meq | PACK | Freq: Once | ORAL | Status: AC
Start: 2018-08-25 — End: 2018-08-25
  Administered 2018-08-25: 13:00:00 40 meq via ORAL
  Filled 2018-08-25: qty 2

## 2018-08-25 MED ORDER — CARVEDILOL 6.25 MG PO TABS
12.5000 mg | ORAL_TABLET | Freq: Two times a day (BID) | ORAL | Status: DC
Start: 2018-08-25 — End: 2018-08-25

## 2018-08-25 MED ORDER — CARVEDILOL 25 MG PO TABS
25.0000 mg | ORAL_TABLET | Freq: Two times a day (BID) | ORAL | Status: DC
Start: 2018-08-25 — End: 2018-08-27
  Administered 2018-08-25 – 2018-08-27 (×4): 25 mg via ORAL
  Filled 2018-08-25 (×4): qty 1

## 2018-08-25 MED ORDER — SACUBITRIL-VALSARTAN 49-51 MG PO TABS
49.0000 mg | ORAL_TABLET | Freq: Two times a day (BID) | ORAL | Status: DC
Start: 2018-08-25 — End: 2018-08-27
  Administered 2018-08-25 – 2018-08-27 (×4): 1 via ORAL
  Filled 2018-08-25 (×4): qty 1

## 2018-08-25 NOTE — Plan of Care (Signed)
Problem: Moderate/High Fall Risk Score >5  Goal: Patient will remain free of falls  Outcome: Progressing     Problem: Safety  Goal: Patient will be free from injury during hospitalization  Outcome: Progressing     Problem: Pain  Goal: Pain at adequate level as identified by patient  Outcome: Progressing     Problem: Side Effects from Pain Analgesia  Goal: Patient will experience minimal side effects of analgesic therapy  Outcome: Progressing     Problem: Discharge Barriers  Goal: Patient will be discharged home or other facility with appropriate resources  Outcome: Progressing     Problem: Psychosocial and Spiritual Needs  Goal: Demonstrates ability to cope with hospitalization/illness  Outcome: Progressing     Problem: Compromised Tissue integrity  Goal: Damaged tissue is healing and protected  Outcome: Progressing     Problem: Hemodynamic Status: Cardiac  Goal: Stable vital signs and fluid balance  Outcome: Progressing     Problem: Impaired Mobility  Goal: Mobility/Activity is maintained at optimal level for patient  Outcome: Progressing

## 2018-08-25 NOTE — Progress Notes (Signed)
MEDICINE TRANSFER ACCEPT NOTE    Date Time: 08/25/18 7:19 AM  Patient Name: Tara Perry  Attending Physician: Dani Gobble, MD    Hospital Course/Interim Summary:     [phone] with h/o DMII, HFrEFpresented 6/5 via EMS after witnessed ventricular fibrillation arrest at home.  She received 3 shocks, 1 round of epi with successful ROSC.  She was intubated for airway protection and underwent therapeutic hypothermia protocol due to poor mental status.  Echocardiogram showed depressed left ventricular ejection fraction of 20%.  She was rewarmed 6/8 and showed neurologic improvement, extubated 6/9.  Left heart catheterization 6/10 showed no significant coronary artery disease.  Patient was stepped down to medicine floor 6/12.  Assessment:     1. Sudden cardiac death, status post ventricular fibrillation arrest  2. 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 Elevated troponin, demand ischemia from above.  No CAD on left heart cath  4. Hypoxic/anoxic encephalopathy, improving  5. Acute hypoxic respiratory failure status post intubation for airway protection  6. Dysphagia secondary to encephalopathy  7. T2DM (non-insulin dependent prior to admission) - A1C 6.5%  8. Hypernatremia from inability to drink to thirst/dysphagia  9. AKI on CKD III, resolved (hypoperfusion injury related to cardiac death)  10. AGMA from fasting ketosis  11. Urinary retention after initial incontinence    Plan:    Planning for biventricular ICD placement Monday 6/15 for secondary prevention   [place] [facility] following.   Increase Coreg to 12.5mg  BID.     Entresto started.   Cont lipitor 10mg    Foley in place, voiding trial today.   PT/OT rec acute rehab, patient's family prefers home with services.         Safety Checklist:   DVT Prophylaxis: SC heparin  Nutrition: mechanical soft diet  Code Status: Full  Lines:     Patient Lines/Drains/Airways Status    Active PICC Line / CVC Line  / PIV Line / Drain / Airway / Intraosseous Line / Epidural Line / ART Line / Line / Wound / Pressure Ulcer / NG/OG Tube     Name:   Placement date:   Placement time:   Site:   Days:    Peripheral IV 08/17/18 Left Forearm   08/17/18    --    Forearm   6    Urethral Catheter   08/22/18    2100    --   less than 1    Puncture Site 08/21/18 Arterial Right Arm   08/21/18    1154     2                 Disposition:     Today's date: 08/25/2018   Admit Date: 08/16/2018 11:32 PM  Reason for ongoing hospitalization: cardiac death  Anticipated discharge needs: TBD    Subjective     CC: Cardiac arrest    Interval History/24 hour events/HPI/Subjective: No acute events.  Denies CP, SOB.    Review of Systems:     Negative except per HPI    Physical Exam:     VITAL SIGNS PHYSICAL EXAM   Temp:  [97.5 F (36.4 C)-98.6 F (37 C)] 98.2 F (36.8 C)  Heart Rate:  [76-110] 76  Resp Rate:  [16-20] 18  BP: (142-172)/(65-89) 142/65        Intake/Output Summary (Last 24 hours) at 08/25/2018 0719  Last data filed at 08/24/2018 2323  Gross per 24 hour   Intake  60 ml   Output 1200 ml   Net -1140 ml    Physical Exam  General appearance: AOx3, NAD  Cardiovascular: RRR,no mrg  Lungs: Bibasilar rales  Abdomen: soft, non-tender, non-distended; no palpable masses,  normoactive bowel sounds  Extremities: trace ble edema         Meds:     Medications were reviewed.    Labs:     Labs (last 72 hours):    Recent Labs   Lab 08/25/18  0412 08/24/18  0437   WBC 12.36* 11.11*   Hgb 10.5* 10.1*   Hematocrit 32.6* 30.9*   Platelets 287 275       Recent Labs   Lab 08/18/18  2257 08/18/18  1533 08/18/18  0824   PT  --  13.8 14.1   PT INR  --  1.1 1.1   PTT 34 28 30       Microbiology, reviewed and are significant for:  Microbiology Results     Procedure Component Value Units Date/Time    Blood Culture Aerobic/Anaerobic #1 [[id number]] Collected:  08/17/18 0105    Specimen:  Arm from Blood, Venipuncture Updated:  08/22/18 0621    Narrative:       ORDER#: [id number]                                     ORDERED BY: Effie Shy, PATRIC  SOURCE: Blood, Venipuncture steripath                COLLECTED:  08/17/18 01:05  ANTIBIOTICS AT COLL.:                                RECEIVED :  08/17/18 03:28  Culture Blood Aerobic and Anaerobic        FINAL       08/22/18 06:21  08/22/18   No growth after 5 days of incubation.      Blood Culture Aerobic/Anaerobic #2 [[id number]] Collected:  08/17/18 0216    Specimen:  Arm from Blood, Venipuncture Updated:  08/22/18 0621    Narrative:       ORDER#: [id number]                                    ORDERED BY: Effie Shy, PATRIC  SOURCE: Blood, Venipuncture steripath  L Ac          COLLECTED:  08/17/18 02:16  ANTIBIOTICS AT COLL.:                                RECEIVED :  08/17/18 03:28  Culture Blood Aerobic and Anaerobic        FINAL       08/22/18 06:21  08/22/18   No growth after 5 days of incubation.      COVID-19 (SARS-CoV-2) [facility] Rapid) [[id number]] Collected:  08/17/18 0157    Specimen:  Nasopharyngeal Swab Updated:  08/17/18 0323     Purpose of COVID testing Screening     SARS-CoV-2 Specimen Source Nasopharyngeal     SARS CoV 2 Overall Result Negative     Comment: Test performed using the Abbott ID NOW EUA assay.  Please see Fact Sheets for patients and providers located at:  http://www.rice.biz/  This test is for the qualitative detection of SARS-CoV-2  (COVID-19)nucleic acid. Viral nucleic acids may persist in vivo,  independent of viability. Detection of viral nucleic acid does  not imply the presence of infectious virus, or that virus  nucleic acid is the cause of clinical symptoms.  Negative results do not preclude SARS-CoV-2 infection and  should not be used as the sole basis for patient management  decisions.  Invalid results may be due to inhibiting substances in the  specimen and recollection should occur.         Narrative:       o Collect and clearly label specimen type:  o Upper respiratory specimen: One Nasopharyngeal Dry  Swab NO  Transport Media.  o Hand deliver to laboratory ASAP    CULTURE BLOOD AEROBIC AND ANAEROBIC [[id number]] Collected:  08/19/18 1505    Specimen:  Arterial Blood Updated:  08/22/18 1921    Narrative:       The order will result in two separate 8-12ml bottles  Please do NOT order repeat blood cultures if one has been  drawn within the last 48 hours.  AVOID BLOOD CULTURE DRAWS FROM CENTRAL LINE IF POSSIBLE  Indications:->Fever of greater than 101.5  ORDER#: [id number]                                    ORDERED BY: Gorden Harms  SOURCE: Arterial Blood                               COLLECTED:  08/19/18 15:05  ANTIBIOTICS AT COLL.:                                RECEIVED :  08/19/18 18:45  Culture Blood Aerobic and Anaerobic        PRELIM      08/22/18 19:21  08/20/18   No Growth after 1 day/s of incubation.  08/21/18   No Growth after 2 day/s of incubation.  08/22/18   No Growth after 3 day/s of incubation.      CULTURE BLOOD AEROBIC AND ANAEROBIC [[id number]] Collected:  08/19/18 1505    Specimen:  Blood, Venipuncture Updated:  08/22/18 1921    Narrative:       The order will result in two separate 8-36ml bottles  Please do NOT order repeat blood cultures if one has been  drawn within the last 48 hours.  AVOID BLOOD CULTURE DRAWS FROM CENTRAL LINE IF POSSIBLE  Indications:->Fever of greater than 101.5  ORDER#: [id number]                                    ORDERED BY: Gorden Harms  SOURCE: Blood, Venipuncture                          COLLECTED:  08/19/18 15:05  ANTIBIOTICS AT COLL.:                                RECEIVED :  08/19/18 18:45  Culture Blood Aerobic and Anaerobic        PRELIM  08/22/18 19:21  08/20/18   No Growth after 1 day/s of incubation.  08/21/18   No Growth after 2 day/s of incubation.  08/22/18   No Growth after 3 day/s of incubation.      MRSA culture - Nares (If not done in triage) [[id number]] Collected:  08/17/18 0621    Specimen:  Culturette from Nares Updated:  08/18/18 0255     Culture  MRSA Surveillance Negative for Methicillin Resistant Staph aureus    MRSA culture - Throat (If not done in triage) [[id number]] Collected:  08/17/18 0621    Specimen:  Culturette from Throat Updated:  08/18/18 0255     Culture MRSA Surveillance Negative for Methicillin Resistant Staph aureus            Imaging, reviewed and are significant for:  Xr Chest Ap Portable    Result Date: 08/19/2018  Lines and tubes as described without pneumothorax. Bibasilar atelectasis without focal consolidation identified. Colonel Bald, MD  08/19/2018 12:35 PM       Recent Labs   Lab 08/25/18  4010 08/24/18  0437  08/22/18  0431  08/21/18  0118   Sodium 143 145  More results in Results Review 142  More results in Results Review 142   Potassium 3.6 3.4*  More results in Results Review 3.6  More results in Results Review 3.5   Chloride 111 115*  More results in Results Review 114*  More results in Results Review 111   CO2 19* 16*  More results in Results Review 18*  More results in Results Review 23   BUN 39.0* 42.0*  More results in Results Review 34.0*  More results in Results Review 23.0*   Creatinine 1.2* 1.1*  More results in Results Review 1.3*  More results in Results Review 1.1*   Calcium 8.5 8.7  More results in Results Review 8.0  More results in Results Review 8.3   Albumin  --   --   --  2.8*  --  2.7*   Protein, Total  --   --   --  5.5*  --  5.3*   Bilirubin, Total  --   --   --  0.5  --  0.6   Alkaline Phosphatase  --   --   --  95  --  94   ALT  --   --   --  34  --  37   AST (SGOT)  --   --   --  22  --  30   Glucose 214* 179*  More results in Results Review 165*  More results in Results Review 127*   More results in Results Review = values in this interval not displayed.        Signed by: Dani Gobble, MD

## 2018-08-25 NOTE — Progress Notes (Addendum)
[place] [facility] PROGRESS NOTE  [facility]      Date Time: 08/25/18 10:34 AM  Patient Name: Tara Perry, Tara Perry  Medical Record #:  [id number]  Account#:  [id number]  Admission Date:  08/16/2018         Patient Active Problem List   Diagnosis    Peritonitis    Viral cardiomyopathy    Type II diabetes mellitus without mention of complication    Gout synovitis    Pulmonary edema    Type 2 diabetes mellitus without complications    SIRS (systemic inflammatory response syndrome)    Viral cardiomyopathy    Dyspnea    CHF (congestive heart failure)    Cardiac arrest       Subjective:   Denies chest pain, SOB or palpitations.    Net I&O 24 hr -  Wt - 3 lbs, 139lb    Assessment:    VF arrest, witnessed with bystander CPR performed, electrically resuscitated with 3 defibrillations, 1 ampule of epinephrine given   NICM: EF 20% on 08/19/2018   Cardiac cath 08-21-18  - no CAD   Seen by EP- BiVICD recommended   Elevated troponin 0.6 demand ischemia   LBBB, chronic   Hypothermia protocol completed on 08/19/2018   Acute kidney injury   Diabetes mellitus   Lekocytosis(pneumonia): bcx No growth so far  Recommendations:    Increase entresto 49-51mg  bid   Increase coreg to 25mg  bid   Add spironolactone 25mg  qd   Monitor hemodynamics and check BMP in am   Pending BiV-AICD   Will follow    I have reviewed the notes, assessments, and/or procedures performed by Edwena Blow, I concur with her/his documentation of CHRISTEENA KROGH.  Pt seen, lungs clear  Cont w/ GDMT for NICM, BP up increasing entresto / coreg today.  Will follow  BiVICD planned for tomorrow  Dr. Delbert Phenix    Medications:      Scheduled Meds:    atorvastatin, 10 mg, Oral, Daily  carvedilol, 12.5 mg, Oral, Q12H SCH  heparin (porcine), 5,000 Units, Subcutaneous, Q12H SCH  insulin glargine, 14 Units, Subcutaneous, QAM  insulin lispro, 1-3 Units, Subcutaneous, QHS  insulin lispro, 1-5 Units, Subcutaneous, TID AC  lidocaine, 2 patch, Transdermal,  Q24H  potassium chloride, 40 mEq, Oral, Once  potassium chloride, 40 mEq, Oral, Once  sacubitril-valsartan, 24-26 mg, Oral, Q12H SCH        Continuous Infusions:           Physical Exam:       VITAL SIGNS PHYSICAL EXAM   Vitals:    08/25/18 0831   BP: 162/74   Pulse: 95   Resp:    Temp:    SpO2:      Temp (24hrs), Avg:97.9 F (36.6 C), Min:97.5 F (36.4 C), Max:98.2 F (36.8 C)      Telemetry: Sr 90s burst of SVT at 2:40am, 15 beats      Intake/Output Summary (Last 24 hours) at 08/25/2018 1034  Last data filed at 08/25/2018 0400  Gross per 24 hour   Intake 110 ml   Output 1200 ml   Net -1090 ml    Physical Exam  General: awake, alert, breathing comfortably, no acute distress  Head: normocephalic  Eyes: EOM's intact  Cardiovascular: regular rate and rhythm, normal S1, S2, no S3, no S4, no murmurs, rubs or gallops  Neck: no carotid bruits or JVD  Lungs: Inspiratory crackles at bilateral lung bases.  Abdomen: soft, non-tender, non-distended; no palpable  masses,  normoactive bowel sounds  Extremities: no edema  Pulse: equal pulses, 4/4 symmetric  Neurological: Alert and oriented X3, mood and affect normal  Musculoskeletal: normal strength and tone         Labs:     Recent Labs   Lab 08/21/18  0810 08/21/18  0118 08/20/18  2138   Troponin I 0.60* 0.19* 0.06*             Recent Labs   Lab 08/22/18  0431   Bilirubin, Total 0.5   Bilirubin, Direct 0.3   Protein, Total 5.5*   Albumin 2.8*   ALT 34   AST (SGOT) 22     Recent Labs   Lab 08/25/18  0412   Magnesium 2.0     Recent Labs   Lab 08/18/18  2257 08/18/18  1533   PT  --  13.8   PT INR  --  1.1   PTT 34 28     Recent Labs   Lab 08/25/18  0412 08/24/18  0437 08/23/18  0413   WBC 12.36* 11.11* 9.75*   Hgb 10.5* 10.1* 10.3*   Hematocrit 32.6* 30.9* 30.5*   Platelets 287 275 252     Recent Labs   Lab 08/25/18  0412 08/24/18  0437 08/23/18  0413   Sodium 143 145 146*   Potassium 3.6 3.4* 3.6   Chloride 111 115* 115*   CO2 19* 16* 15*   BUN 39.0* 42.0* 40.0*   Creatinine 1.2*  1.1* 1.1*   EGFR 43.9 48.6 48.6   Glucose 214* 179* 210*   Calcium 8.5 8.7 8.5           Invalid input(s): FREET4    .  Lab Results   Component Value Date    BNP 139 (H) 08/16/2018        Weight Monitoring 08/20/2018 08/20/2018 08/21/2018 08/22/2018 08/23/2018 08/24/2018 08/25/2018   Height 162.6 cm - - - - - 162.6 cm   Height Method - - - - - - Stated   Weight - 67.8 kg 71.3 kg 67.5 kg 66.2 kg 64.501 kg 63.413 kg   Weight Method - - - Bed Scale - Bed Scale Standing Scale   BMI (calculated) - - - - - - 24 kg/m2       Imaging:       ____________________________________________    Signed by: Joylene Igo, FNP      [facility]  NP Spectralink [phone] (8am-5pm)  MD Spectralink [phone] or [phone] (8am-5pm)  Arrhythmia Spectralink [phone] (8am-4:30pm)  After hours, non urgent consult line [phone]  After Hours, urgent consults [phone]

## 2018-08-25 NOTE — Progress Notes (Signed)
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  Plan in place for secondary prevention ICD tomorrow.  With underlying LBBB and chronic systolic CHF, CRT-D planned.        Recommendations:    NPO at MN for device implant tomorr   Give evening glargine, but give 1/2 dose SS while NPO   Please make sure has left sided IV for procedure tomorrow   Will trend leukocysosis/fever curve    Medications:      Scheduled Meds:    atorvastatin, 10 mg, Oral, Daily  carvedilol, 25 mg, Oral, Q12H SCH  heparin (porcine), 5,000 Units, Subcutaneous, Q12H SCH  insulin glargine, 14 Units, Subcutaneous, QAM  insulin lispro, 1-3 Units, Subcutaneous, QHS  insulin lispro, 1-5 Units, Subcutaneous, TID AC  lidocaine, 2 patch, Transdermal, Q24H  potassium chloride, 40 mEq, Oral, Once  potassium chloride, 40 mEq, Oral, Once  sacubitril-valsartan, 49-51 mg, Oral, Q12H SCH  spironolactone, 25 mg, Oral, Daily        Continuous Infusions:       Subjective:   No chest pain; clearing mentation.    Physical Exam:     VITAL SIGNS PHYSICAL EXAM   Temp:  [97.5 F (36.4 C)-98.2 F (36.8 C)] 98 F (36.7 C)  Heart Rate:  [76-99] 95  Resp Rate:  [16-18] 18  BP: (142-162)/(65-89) 162/74  Temp (24hrs), Avg:97.9 F  (36.6 C), Min:97.5 F (36.4 C), Max:98.2 F (36.8 C)          Intake/Output Summary (Last 24 hours) at 08/25/2018 1109  Last data filed at 08/25/2018 0726  Gross per 24 hour   Intake 110 ml   Output 1550 ml   Net -1440 ml    Physical Exam  General: awake, alert, breathing comfortably, no acute distress  Head: normocephalic  Eyes: EOM's intact  Cardiovascular: regular rate and rhythm, normal S1, S2, no S3, no S4, no murmurs, rubs or gallops  Neck: no carotid bruits or JVD  Lungs: Inspiratory crackles at bilateral lung bases.  Abdomen: soft, non-tender, non-distended; no palpable masses,  normoactive bowel sounds  Extremities: no edema  Pulse: equal pulses, 4/4 symmetric  Neurological: Alert and oriented X3, mood and affect normal  Musculoskeletal: normal strength and tone         Labs:           Recent Labs   Lab 08/25/18  0412   Magnesium 2.0     Recent Labs   Lab 08/18/18  2257 08/18/18  1533   PT  --  13.8   PT INR  --  1.1   PTT 34 28     Recent Labs  Lab 08/25/18  0412 08/24/18  0437 08/23/18  0413   WBC 12.36* 11.11* 9.75*   Hgb 10.5* 10.1* 10.3*   Hematocrit 32.6* 30.9* 30.5*   Platelets 287 275 252     Recent Labs   Lab 08/25/18  0412 08/24/18  0437 08/23/18  0413   Sodium 143 145 146*   Potassium 3.6 3.4* 3.6   Chloride 111 115* 115*   CO2 19* 16* 15*   BUN 39.0* 42.0* 40.0*   Creatinine 1.2* 1.1* 1.1*   EGFR 43.9 48.6 48.6   Glucose 214* 179* 210*   Calcium 8.5 8.7 8.5           Invalid input(s): FREET4  Recent Labs   Lab 08/22/18  0431   Bilirubin, Total 0.5   Bilirubin, Direct 0.3   Protein, Total 5.5*   Albumin 2.8*   ALT 34   AST (SGOT) 22     Recent Labs   Lab 08/21/18  0810 08/21/18  0118 08/20/18  2138   Troponin I 0.60* 0.19* 0.06*     .  Lab Results   Component Value Date    BNP 139 (H) 08/16/2018      Estimated Creatinine Clearance: 36.1 mL/min (A) (based on SCr of 1.2 mg/dL (H)).  Diagnostics:   Telemetry: no alerts or events    Signed by     Thurnell Lose, MD  Cardiac Electrophysiology  Heart  [facility]  [facility]    Arrhythmia Spectralink [phone] (8am-4:30pm)  After hours, non urgent consult line [phone]  After Hours, urgent consults [phone]  For EP questions or consults during the week, easiest to call Spectralink [phone]   You can also reach [place] 24/7 through our main EP office [phone].

## 2018-08-25 NOTE — Plan of Care (Signed)
Neuro: alert and oriented, loses words at times  Vital Signs/Rhythm & Rate: blood pressures systolic 140s-160s, BP cardiac meds have been adjusted per MD for tonight. Oxygenation WNL, afebrile, heart rate 80s-100s with ambulation, large BBB and sinus rhythm  Cardiac symptoms: none  Respiratory  room air SPO2 maintained >92%  GI/GU/diet: foley catheter removed at 0900, pt voided 250 at 1400, post void bladder scan 41 mL. Pt with low PO fluid intake because she does not like thickened liquids, had small BM today. Blood glucose controlled with lantus and SSI per orders  Integumentary: see flowsheets  Lines/Drains/Drips: x1 PIV to L forearm  Mobility: x1 assist with walker. Pt ambulated to chair and bathroom multiple times today, contact guard assist due to unsteadiness on feet    Plan: NPO at 0000 pending ICD placement tomorrow, pt aware of plan, monitor heart rate and blood pressure, monitor urine retention     Problem: Safety  Goal: Patient will be free from injury during hospitalization  Outcome: Progressing  Flowsheets (Taken 08/24/2018 1323 by Leo Rod, RN)  Patient will be free from injury during hospitalization:   Assess patient's risk for falls and implement fall prevention plan of care per policy   Ensure appropriate safety devices are available at the bedside   Assess for patients risk for elopement and implement Elopement Risk Plan per policy   Provide alternative method of communication if needed (communication boards, writing)   Include patient/ family/ care giver in decisions related to safety   Provide and maintain safe environment   Use appropriate transfer methods   Hourly rounding     Problem: Hemodynamic Status: Cardiac  Goal: Stable vital signs and fluid balance  Outcome: Progressing  Flowsheets (Taken 08/24/2018 1323 by Leo Rod, RN)  Stable vital signs and fluid balance:   Monitor/assess vital signs and telemetry per unit protocol   Assess signs and symptoms associated with cardiac  rhythm changes   Monitor for leg swelling/edema and report to LIP if abnormal   Monitor intake/output per unit protocol and/or LIP order   Weigh on admission and record weight daily   Monitor lab values     Problem: Impaired Mobility  Goal: Mobility/Activity is maintained at optimal level for patient  Outcome: Progressing  Flowsheets (Taken 08/22/2018 1853 by Lenise Arena, RN)  Mobility/activity is maintained at optimal level for patient:   Increase mobility as tolerated/progressive mobility   Encourage independent activity per ability   Maintain proper body alignment   Perform active/passive ROM   Plan activities to conserve energy, plan rest periods   Reposition patient every 2 hours and as needed unless able to reposition self   Assess for changes in respiratory status, level of consciousness and/or development of fatigue   Consult/collaborate with Physical Therapy and/or Occupational Therapy

## 2018-08-26 ENCOUNTER — Encounter: Payer: Self-pay | Admitting: Anesthesiology

## 2018-08-26 ENCOUNTER — Ambulatory Visit: Admit: 2018-08-26 | Payer: Self-pay | Admitting: Cardiovascular Disease

## 2018-08-26 ENCOUNTER — Inpatient Hospital Stay: Payer: Medicare Other

## 2018-08-26 ENCOUNTER — Inpatient Hospital Stay: Payer: Medicare Other | Admitting: Certified Registered Nurse Anesthetist

## 2018-08-26 ENCOUNTER — Encounter
Admission: EM | Disposition: A | Discharge status: Home Health | Payer: Self-pay | Source: Home / Self Care | Attending: Critical Care Medicine

## 2018-08-26 ENCOUNTER — Ambulatory Visit (INDEPENDENT_AMBULATORY_CARE_PROVIDER_SITE_OTHER): Payer: Self-pay

## 2018-08-26 HISTORY — PX: ICD IMPLANT BIV: EP17

## 2018-08-26 LAB — CBC AND DIFFERENTIAL
Absolute NRBC: 0 10*3/uL (ref 0.00–0.00)
Basophils Absolute Automated: 0.06 10*3/uL (ref 0.00–0.08)
Basophils Automated: 0.5 %
Eosinophils Absolute Automated: 0.41 10*3/uL (ref 0.00–0.44)
Eosinophils Automated: 3.3 %
Hematocrit: 33.4 % — ABNORMAL LOW (ref 34.7–43.7)
Hgb: 11.1 g/dL — ABNORMAL LOW (ref 11.4–14.8)
Immature Granulocytes Absolute: 0.1 10*3/uL — ABNORMAL HIGH (ref 0.00–0.07)
Immature Granulocytes: 0.8 %
Lymphocytes Absolute Automated: 2.25 10*3/uL (ref 0.42–3.22)
Lymphocytes Automated: 18.2 %
MCH: 29.9 pg (ref 25.1–33.5)
MCHC: 33.2 g/dL (ref 31.5–35.8)
MCV: 90 fL (ref 78.0–96.0)
MPV: 9.7 fL (ref 8.9–12.5)
Monocytes Absolute Automated: 1.37 10*3/uL — ABNORMAL HIGH (ref 0.21–0.85)
Monocytes: 11.1 %
Neutrophils Absolute: 8.17 10*3/uL — ABNORMAL HIGH (ref 1.10–6.33)
Neutrophils: 66.1 %
Nucleated RBC: 0 /100 WBC (ref 0.0–0.0)
Platelets: 333 10*3/uL (ref 142–346)
RBC: 3.71 10*6/uL — ABNORMAL LOW (ref 3.90–5.10)
RDW: 13 % (ref 11–15)
WBC: 12.36 10*3/uL — ABNORMAL HIGH (ref 3.10–9.50)

## 2018-08-26 LAB — BASIC METABOLIC PANEL
BUN: 36 mg/dL — ABNORMAL HIGH (ref 7.0–19.0)
CO2: 19 mEq/L — ABNORMAL LOW (ref 22–29)
Calcium: 8.6 mg/dL (ref 7.9–10.2)
Chloride: 109 mEq/L (ref 100–111)
Creatinine: 1.2 mg/dL — ABNORMAL HIGH (ref 0.6–1.0)
Glucose: 225 mg/dL — ABNORMAL HIGH (ref 70–100)
Potassium: 3.7 mEq/L (ref 3.5–5.1)
Sodium: 141 mEq/L (ref 136–145)

## 2018-08-26 LAB — COVID-19 (SARS-COV-2): SARS CoV 2 Overall Result: NEGATIVE

## 2018-08-26 LAB — GFR: EGFR: 43.9

## 2018-08-26 LAB — GLUCOSE WHOLE BLOOD - POCT
Whole Blood Glucose POCT: 231 mg/dL — ABNORMAL HIGH (ref 70–100)
Whole Blood Glucose POCT: 182 mg/dL — ABNORMAL HIGH (ref 70–100)
Whole Blood Glucose POCT: 197 mg/dL — ABNORMAL HIGH (ref 70–100)
Whole Blood Glucose POCT: 182 mg/dL — ABNORMAL HIGH (ref 70–100)
Whole Blood Glucose POCT: 232 mg/dL — ABNORMAL HIGH (ref 70–100)
Whole Blood Glucose POCT: 387 mg/dL — ABNORMAL HIGH (ref 70–100)

## 2018-08-26 LAB — MAGNESIUM: Magnesium: 1.9 mg/dL (ref 1.6–2.6)

## 2018-08-26 SURGERY — ICD IMPLANT BIV
Anesthesia: Anesthesia General

## 2018-08-26 MED ORDER — CEFUROXIME SODIUM 1.5 G IJ/IV SOLR (WRAP)
Status: DC | PRN
Start: 2018-08-26 — End: 2018-08-26
  Administered 2018-08-26: 1.5 g via INTRAVENOUS

## 2018-08-26 MED ORDER — LIDOCAINE HCL (PF) 1 % IJ SOLN
INTRAMUSCULAR | Status: AC
Start: 2018-08-26 — End: ?
  Filled 2018-08-26: qty 30

## 2018-08-26 MED ORDER — FAMOTIDINE 10 MG/ML IV SOLN (WRAP)
INTRAVENOUS | Status: DC | PRN
Start: 2018-08-26 — End: 2018-08-26
  Administered 2018-08-26: 20 mg via INTRAVENOUS

## 2018-08-26 MED ORDER — DIPHENHYDRAMINE HCL 50 MG/ML IJ SOLN
INTRAMUSCULAR | Status: DC | PRN
Start: 2018-08-26 — End: 2018-08-26
  Administered 2018-08-26: 12.5 mg via INTRAVENOUS

## 2018-08-26 MED ORDER — BENZONATATE 100 MG PO CAPS
100.00 mg | ORAL_CAPSULE | Freq: Three times a day (TID) | ORAL | Status: DC | PRN
Start: 2018-08-26 — End: 2018-08-27
  Administered 2018-08-26 – 2018-08-27 (×2): 100 mg via ORAL
  Filled 2018-08-26 (×2): qty 1

## 2018-08-26 MED ORDER — HYDROMORPHONE HCL 0.5 MG/0.5 ML IJ SOLN
0.5000 mg | INTRAMUSCULAR | Status: DC | PRN
Start: 2018-08-26 — End: 2018-08-26

## 2018-08-26 MED ORDER — LACTATED RINGERS IV SOLN
INTRAVENOUS | Status: DC
Start: 2018-08-26 — End: 2018-08-26

## 2018-08-26 MED ORDER — HYDROMORPHONE HCL 2 MG PO TABS
2.0000 mg | ORAL_TABLET | Freq: Once | ORAL | Status: DC | PRN
Start: 2018-08-26 — End: 2018-08-26

## 2018-08-26 MED ORDER — EPHEDRINE SULFATE 50 MG/ML IJ/IV SOLN (WRAP)
Status: AC
Start: 2018-08-26 — End: ?
  Filled 2018-08-26: qty 1

## 2018-08-26 MED ORDER — SODIUM CHLORIDE 0.45 % IV SOLN
INTRAVENOUS | Status: DC
Start: 2018-08-26 — End: 2018-08-26

## 2018-08-26 MED ORDER — CEFUROXIME SODIUM 1.5 G IJ/IV SOLR (WRAP)
1.5000 g | Freq: Three times a day (TID) | Status: AC
Start: 2018-08-26 — End: 2018-08-27
  Administered 2018-08-26 – 2018-08-27 (×2): 1.5 g via INTRAVENOUS
  Filled 2018-08-26 (×2): qty 1500

## 2018-08-26 MED ORDER — LIDOCAINE HCL (PF) 2 % IJ SOLN
INTRAMUSCULAR | Status: AC
Start: 2018-08-26 — End: ?
  Filled 2018-08-26: qty 5

## 2018-08-26 MED ORDER — ETOMIDATE 2 MG/ML IV SOLN
INTRAVENOUS | Status: DC | PRN
Start: 2018-08-26 — End: 2018-08-26
  Administered 2018-08-26: 18 mg via INTRAVENOUS

## 2018-08-26 MED ORDER — POTASSIUM CHLORIDE 20 MEQ/15ML (10%) PO LIQD UD CUP
20.00 meq | Freq: Every day | ORAL | Status: DC
Start: 2018-08-26 — End: 2018-08-26

## 2018-08-26 MED ORDER — DEXAMETHASONE SODIUM PHOSPHATE 20 MG/5ML IJ SOLN
INTRAMUSCULAR | Status: AC
Start: 2018-08-26 — End: ?
  Filled 2018-08-26: qty 5

## 2018-08-26 MED ORDER — CEFUROXIME SODIUM 1.5 G IJ/IV SOLR (WRAP)
Status: AC
Start: 2018-08-26 — End: ?
  Filled 2018-08-26: qty 1500

## 2018-08-26 MED ORDER — IODIXANOL 320 MG/ML IV SOLN
INTRAVENOUS | Status: AC | PRN
Start: 2018-08-26 — End: 2018-08-26
  Administered 2018-08-26: 10 mL via INTRAVENOUS

## 2018-08-26 MED ORDER — VASOPRESSIN 20 UNIT/ML IV SOLN
INTRAVENOUS | Status: AC
Start: 2018-08-26 — End: ?
  Filled 2018-08-26: qty 1

## 2018-08-26 MED ORDER — LIDOCAINE HCL 1 % IJ SOLN
INTRAMUSCULAR | Status: AC | PRN
Start: 2018-08-26 — End: 2018-08-26
  Administered 2018-08-26: 10 mL via SUBCUTANEOUS

## 2018-08-26 MED ORDER — LACTATED RINGERS IV SOLN
INTRAVENOUS | Status: DC | PRN
Start: 2018-08-26 — End: 2018-08-26

## 2018-08-26 MED ORDER — ACETAMINOPHEN 325 MG PO TABS
650.0000 mg | ORAL_TABLET | Freq: Once | ORAL | Status: DC | PRN
Start: 2018-08-26 — End: 2018-08-26

## 2018-08-26 MED ORDER — FENTANYL CITRATE (PF) 50 MCG/ML IJ SOLN (WRAP)
25.0000 ug | INTRAMUSCULAR | Status: DC | PRN
Start: 2018-08-26 — End: 2018-08-26

## 2018-08-26 MED ORDER — DIPHENHYDRAMINE HCL 50 MG/ML IJ SOLN
INTRAMUSCULAR | Status: AC
Start: 2018-08-26 — End: ?
  Filled 2018-08-26: qty 1

## 2018-08-26 MED ORDER — ONDANSETRON HCL 4 MG/2ML IJ SOLN
4.0000 mg | Freq: Once | INTRAMUSCULAR | Status: DC | PRN
Start: 2018-08-26 — End: 2018-08-26

## 2018-08-26 MED ORDER — INSULIN REGULAR HUMAN 100 UNIT/ML IJ SOLN
INTRAMUSCULAR | Status: DC | PRN
Start: 2018-08-26 — End: 2018-08-26
  Administered 2018-08-26: 15:00:00 3 [IU] via INTRAVENOUS

## 2018-08-26 MED ORDER — SODIUM CHLORIDE 0.9 % IV SOLN
INTRAVENOUS | Status: DC | PRN
Start: 2018-08-26 — End: 2018-08-26
  Administered 2018-08-26: 14:00:00 30 ug/min via INTRAVENOUS

## 2018-08-26 MED ORDER — LIDOCAINE HCL 2 % IJ SOLN
INTRAMUSCULAR | Status: DC | PRN
Start: 2018-08-26 — End: 2018-08-26
  Administered 2018-08-26: 100 mg via INTRAVENOUS

## 2018-08-26 MED ORDER — GLYCOPYRROLATE 0.2 MG/ML IJ SOLN
INTRAMUSCULAR | Status: AC
Start: 2018-08-26 — End: ?
  Filled 2018-08-26: qty 1

## 2018-08-26 MED ORDER — PROPOFOL 10 MG/ML IV EMUL (WRAP)
INTRAVENOUS | Status: AC
Start: 2018-08-26 — End: ?
  Filled 2018-08-26: qty 20

## 2018-08-26 MED ORDER — POTASSIUM CHLORIDE CRYS ER 20 MEQ PO TBCR
40.00 meq | EXTENDED_RELEASE_TABLET | Freq: Once | ORAL | Status: DC
Start: 2018-08-26 — End: 2018-08-27

## 2018-08-26 MED ORDER — ETOMIDATE 2 MG/ML IV SOLN
INTRAVENOUS | Status: AC
Start: 2018-08-26 — End: ?
  Filled 2018-08-26: qty 20

## 2018-08-26 MED ORDER — AMMONIA AROMATIC IN INHA
1.0000 | Freq: Once | RESPIRATORY_TRACT | Status: DC | PRN
Start: 2018-08-26 — End: 2018-08-27

## 2018-08-26 MED ORDER — DEXAMETHASONE SODIUM PHOSPHATE 4 MG/ML IJ SOLN (WRAP)
INTRAMUSCULAR | Status: DC | PRN
Start: 2018-08-26 — End: 2018-08-26
  Administered 2018-08-26: 4 mg via INTRAVENOUS

## 2018-08-26 MED ORDER — FAMOTIDINE 20 MG/2ML IV SOLN
INTRAVENOUS | Status: AC
Start: 2018-08-26 — End: ?
  Filled 2018-08-26: qty 2

## 2018-08-26 MED ORDER — POTASSIUM CHLORIDE 20 MEQ PO PACK
40.00 meq | PACK | Freq: Every day | ORAL | Status: DC
Start: 2018-08-26 — End: 2018-08-27
  Administered 2018-08-26 – 2018-08-27 (×2): 40 meq via ORAL
  Filled 2018-08-26 (×2): qty 2

## 2018-08-26 MED ORDER — ONDANSETRON HCL 4 MG/2ML IJ SOLN
INTRAMUSCULAR | Status: AC
Start: 2018-08-26 — End: ?
  Filled 2018-08-26: qty 2

## 2018-08-26 MED ORDER — ONDANSETRON HCL 4 MG/2ML IJ SOLN
INTRAMUSCULAR | Status: DC | PRN
Start: 2018-08-26 — End: 2018-08-26
  Administered 2018-08-26: 4 mg via INTRAVENOUS

## 2018-08-26 NOTE — Plan of Care (Signed)
Attending Attestation:   I have reviewed the interval history, images and pertinent test results and personally examined the patient and confirmed the major physical findings of the preceeding note above by NP/PA. Agree with above with following caveats:    Assessment:  37F with h/o DMII, HFrEFpresented 6/5 via EMS after witnessed ventricular fibrillation arrest at home.  She received 3 shocks, 1 round of epi with successful ROSC.  She was intubated for airway protection and underwent therapeutic hypothermia protocol due to poor mental status.  Echocardiogram showed depressed left ventricular ejection fraction of 20%.  She was rewarmed 6/8 and showed neurologic improvement, extubated 6/9.  Left heart catheterization 6/10 showed no significant coronary artery disease.  Patient was stepped down to medicine floor 6/12.    Plan:   NPO for ICD today.  Cont coreg, entresto, aldactone, lipitor.  Increase insulin post procedure.   Likely d/c in am with home services.        Dani Gobble, MD

## 2018-08-26 NOTE — H&P (Signed)
History and Physical currently available, in Epic or on paper, has been reviewed, and there are no major changes. Pt. seen and examined by me prior to procedure.     The patient and physician have participated in a shared decision-making encounter using the [employer] shared decision-making tool for implantable cardioverter-defibrillators.         Karston Hyland L Jenai Scaletta, MD

## 2018-08-26 NOTE — Plan of Care (Signed)
Comments: Pt is AOx4. HR 80s. SBP 130s to 140s. NSR. SpO2 WNL on RA. Pt is afebrile and denies pain, SOB, and dizziness. Safety maintained. Hourly rounding. Bed low and locked, bedside table and call bell within reach, bed alarm on, and fall mat in place.    1050: Pt transported on monitor to [facility].    S/p L chest ICD implant. Site is CDI; soft and non-tender. Dressing: Dermabond. Settings: DDD 60-130.    Plan per MD:   - PT/OT and SLP following  - Monitor L chest incision site

## 2018-08-26 NOTE — Progress Notes (Signed)
On call paged regarding covid test prior to procedure, no test is needed at this time per on call

## 2018-08-26 NOTE — Progress Notes (Signed)
Received report and patient into [facility] 17 s/p ICD implant. Assumed patient's care. ID band verified, alert and oriented x 3, VSS, aldrete 10, pain0/10, SR. Incision CDI, no bleeding or hematoma noted.  Oriented to room and call bell. Call bell within reach.

## 2018-08-26 NOTE — Plan of Care (Signed)
Cross cover    Paged by bedside RN re: K 3.7     Plan  -Repleted, see MAR

## 2018-08-26 NOTE — Transfer of Care (Signed)
Anesthesia Transfer of Care Note    Patient: RENISE GILLIES    Procedures performed: Procedure(s) with comments:  ICD IMPLANT BIV - medtronic    Anesthesia type: General LMA    Patient location:[facility]    Last vitals:   Vitals:    08/26/18 1545   BP: 164/70   Pulse: 70   Resp: 12   Temp: 36.7 C (98 F)   SpO2: 100%       Post pain: Patient not complaining of pain, continue current therapy      Mental Status:awake and sedated    Respiratory Function: tolerating face mask    Cardiovascular: stable    Nausea/Vomiting: patient not complaining of nausea or vomiting    Hydration Status: adequate    Post assessment: no apparent anesthetic complications, no reportable events and no evidence of recall    Signed by: Johnette Abraham  08/26/18 3:45 PM

## 2018-08-26 NOTE — Brief Op Note (Signed)
Successful mdt bivicd earlier this afternoon as planned via left cephalic/ax to rva/raa / mid lateral lv, all without apparent complications.    Called discussed and her daughter(s) afterwards. I have also now reviewed cxr, with leads in expected positions and no ptx.    Thanks, Particia Lather, MD  [facility]  [phone]; [phone] FFX Spectralink.

## 2018-08-26 NOTE — Progress Notes (Signed)
Pt admitted to rm9 for Bi-V ICD implant by Dr. Marianna Fuss. Verified pt ID band and procedure. Confirmed NPO status: pt had sips of water w/ meds this AM. Confirmed known allergies. Pt's bilateral chest/neck/shoulders cleansed w/ CHG wipes in pre-op.     Pt is A&Ox4, follows commands, afebrile, VSS, SR w/ BBB on tele, breath sounds=clear/dimnished, resp easy and unlabored at rest, currently asymptomatic, palpable BUE and BLE peripheral pulses, no s/s of distress. IVF infusing per MD orders.    Pt oriented to rm, call bell w/in reach, and bed in lowest position. Instructed pt to report any changes from baseline and/or new onset s/s immediately via call bell, pt verb understanding. Educated pt r/t fall precautions, pt verb understanding.     Recent labs results (from 08/26/18) in Epic, current bs=182. 08/26/18 Covid test=negative. Anesthesia consent completed and placed in pt's chart. Awaiting updated H&P and procedural consent by MD.

## 2018-08-26 NOTE — Progress Notes (Signed)
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    per patient, he only sees peripheral view    Diabetes mellitus 1992    Gout spondylitis     Gout synovitis     Gout synovitis     Type 2 diabetes mellitus, controlled       ACCESS:      INCISION SITE FOR DEVICE:  LT CHEST   Dressing: DERMABOND    Visual appearance: clean/dry/intact     MEDICATIONS:    See Anesthesia report for all anesthesia meds given  VITALS:    BP: 114/56        Pulse:77          O2 SAT: 100%       Rhythm:  Paced rhythm  PROCEDURE DETAILS:    SEE PHYSICIANS PROCEDURE NOTE    DEVICE SETTINGS:DDD 60-130  VF 194 MONITOR 171    Complications:      none                                Report given to: Adella Hare RN

## 2018-08-26 NOTE — Progress Notes (Signed)
MEDICINE PROGRESS NOTE    Date Time: 08/26/18 1:51 PM  Patient Name: Tara Perry  Attending Physician: Dani Gobble, MD    Assessment:   This is a 73 year old woman with a history of T2DM and HFrEF with prior transient recovery who presented 6/5 via EMS after witnessed ventricular fibrillation arrest at home.  She received 3 shocks, 1 round of epi with successful ROSC.  She was intubated for airway protection and underwent therapeutic hypothermia protocol due to poor mental status.  Echo showed EF of 20%. LHC was negative for coronary artery disease. Mental status improved after rewarming and successful extubation prior to transfer to PCCU.     Plan:   1. VF arrest- NPO for ICD today with [place] [facility] EP.   2. 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 Elevated troponin, demand ischemia secondary to VF arrest- peaked at 0.82  4. Hypoxic/anoxic respiratory failure, extubated 08/20/2018. Now on RA with normal sats.  5. Dyphagia secondary to encephalopathy-improving.   6. T2DM  7. Hypernatremia - improved.   8. AKI on CKD3 2/2 prerenal in the setting of VF arrest- improved.     Case discussed with: RN Trinna Post, patient.     Safety Checklist:     DVT prophylaxis:  CHEST guideline (See page [id number]) Chemical and Mechanical   Foley:  [place] Rn Foley protocol Not present   IVs:  Peripheral IV   PT/OT: Ordered   Daily CBC & or Chem ordered:  SHM/ABIM guidelines (see #5) Yes, due to clinical and lab instability   Reference for approximate charges of common labs: CBC auto diff - $76   BMP - $99   Mg - $79    Lines:     Patient Lines/Drains/Airways Status    Active PICC Line / CVC Line / PIV Line / Drain / Airway / Intraosseous Line / Epidural Line / ART Line / Line / Wound / Pressure Ulcer / NG/OG Tube     Name:   Placement date:   Placement time:   Site:   Days:    Peripheral IV 08/26/18 Anterior;Left Forearm   08/26/18    0529    Forearm   less than 1    Puncture Site 08/21/18 Arterial Right Arm   08/21/18    1154     5                  Disposition: (Please see PAF column for Expected D/C Date)   Today's date: 08/26/2018   Admit Date: 08/16/2018 11:32 PM   LOS: 9  Clinical Milestones: ICD placement.  Anticipated discharge needs: tbd.      Subjective     CC: VT arrest     Interval History/24 hour events: no events overnight.     Review of Systems:     As per HPI    Physical Exam:     VITAL SIGNS PHYSICAL EXAM   Temp:  [97.5 F (36.4 C)-98.3 F (36.8 C)] 98 F (36.7 C)  Heart Rate:  [73-101] 75  Resp Rate:  [17-18] 18  BP: (110-159)/(55-70) 129/59      Intake/Output Summary (Last 24 hours) at 08/26/2018 1351  Last data filed at 08/26/2018 0800  Gross per 24 hour   Intake 0 ml   Output 250 ml   Net -250 ml    Physical Exam  General: awake, alert X 3  Cardiovascular: regular rate and rhythm, no murmurs, rubs or gallops  Lungs: clear to  auscultation bilaterally, without wheezing, rhonchi, or rales  Abdomen: soft, non-tender, non-distended; no palpable masses,  normoactive bowel sounds  Extremities: no edema         Meds:     Medications were reviewed:  Current Facility-Administered Medications   Medication Dose Route Frequency    atorvastatin  10 mg Oral Daily    carvedilol  25 mg Oral Q12H SCH    heparin (porcine)  5,000 Units Subcutaneous Q12H SCH    insulin glargine  14 Units Subcutaneous QAM    insulin lispro  1-3 Units Subcutaneous QHS    insulin lispro  1-5 Units Subcutaneous TID AC    lidocaine  2 patch Transdermal Q24H    potassium chloride  40 mEq Oral Once    potassium chloride  40 mEq Oral Daily    sacubitril-valsartan  49-51 mg Oral Q12H SCH    spironolactone  25 mg Oral Daily     Current Facility-Administered Medications   Medication Dose Route Frequency Last Rate     Current Facility-Administered Medications   Medication Dose Route    acetaminophen  650 mg Oral    dextrose  15 g of glucose Oral    And    dextrose  12.5 g Intravenous    And    glucagon (rDNA)  1 mg Intramuscular    dextrose  15 g of glucose Oral     And    dextrose  12.5 g Intravenous    And    glucagon (rDNA)  1 mg Intramuscular    hydrALAZINE  10 mg Intravenous         Labs:     Labs were reviewed:    Labs (last 72 hours):    Recent Labs   Lab 08/26/18  0528 08/25/18  0412   WBC 12.36* 12.36*   Hgb 11.1* 10.5*   Hematocrit 33.4* 32.6*   Platelets 333 287          Recent Labs   Lab 08/26/18  0528 08/25/18  0412  08/22/18  0431  08/21/18  0118   Sodium 141 143  More results in Results Review 142  More results in Results Review 142   Potassium 3.7 3.6  More results in Results Review 3.6  More results in Results Review 3.5   Chloride 109 111  More results in Results Review 114*  More results in Results Review 111   CO2 19* 19*  More results in Results Review 18*  More results in Results Review 23   BUN 36.0* 39.0*  More results in Results Review 34.0*  More results in Results Review 23.0*   Creatinine 1.2* 1.2*  More results in Results Review 1.3*  More results in Results Review 1.1*   Calcium 8.6 8.5  More results in Results Review 8.0  More results in Results Review 8.3   Albumin  --   --   --  2.8*  --  2.7*   Protein, Total  --   --   --  5.5*  --  5.3*   Bilirubin, Total  --   --   --  0.5  --  0.6   Alkaline Phosphatase  --   --   --  95  --  94   ALT  --   --   --  34  --  37   AST (SGOT)  --   --   --  22  --  30   Glucose 225*  214*  More results in Results Review 165*  More results in Results Review 127*   More results in Results Review = values in this interval not displayed.                   Microbiology, reviewed and are significant for:  Microbiology Results     Procedure Component Value Units Date/Time    Blood Culture Aerobic/Anaerobic #1 [[id number]] Collected:  08/17/18 0105    Specimen:  Arm from Blood, Venipuncture Updated:  08/22/18 0621    Narrative:       ORDER#: [id number]                                    ORDERED BY: Effie Shy, PATRIC  SOURCE: Blood, Venipuncture steripath                COLLECTED:  08/17/18 01:05  ANTIBIOTICS AT COLL.:                                 RECEIVED :  08/17/18 03:28  Culture Blood Aerobic and Anaerobic        FINAL       08/22/18 06:21  08/22/18   No growth after 5 days of incubation.      Blood Culture Aerobic/Anaerobic #2 [[id number]] Collected:  08/17/18 0216    Specimen:  Arm from Blood, Venipuncture Updated:  08/22/18 0621    Narrative:       ORDER#: [id number]                                    ORDERED BY: Effie Shy, PATRIC  SOURCE: Blood, Venipuncture steripath  L Ac          COLLECTED:  08/17/18 02:16  ANTIBIOTICS AT COLL.:                                RECEIVED :  08/17/18 03:28  Culture Blood Aerobic and Anaerobic        FINAL       08/22/18 06:21  08/22/18   No growth after 5 days of incubation.      COVID-19 (SARS-CoV-2) [facility] Rapid) [[id number]] Collected:  08/17/18 0157    Specimen:  Nasopharyngeal Swab Updated:  08/17/18 0323     Purpose of COVID testing Screening     SARS-CoV-2 Specimen Source Nasopharyngeal     SARS CoV 2 Overall Result Negative     Comment: Test performed using the Abbott ID NOW EUA assay.  Please see Fact Sheets for patients and providers located at:  http://www.rice.biz/  This test is for the qualitative detection of SARS-CoV-2  (COVID-19)nucleic acid. Viral nucleic acids may persist in vivo,  independent of viability. Detection of viral nucleic acid does  not imply the presence of infectious virus, or that virus  nucleic acid is the cause of clinical symptoms.  Negative results do not preclude SARS-CoV-2 infection and  should not be used as the sole basis for patient management  decisions.  Invalid results may be due to inhibiting substances in the  specimen and recollection should occur.         Narrative:       o Collect and clearly label  specimen type:  o Upper respiratory specimen: One Nasopharyngeal Dry Swab NO  Transport Media.  o Hand deliver to laboratory ASAP    COVID-19 (SARS-CoV-2) ( Rapid)- Medically necessary procedure/surgery within 24 hours (no  isolation) [[id number]] Collected:  08/26/18 0635    Specimen:  Nasopharyngeal Swab Updated:  08/26/18 0716     Purpose of COVID testing Screening     SARS-CoV-2 Specimen Source Nasopharyngeal     SARS CoV 2 Overall Result Negative     Comment: Test performed using the Abbott ID NOW EUA assay.  Please see Fact Sheets for patients and providers located at:  http://www.rice.biz/  This test is for the qualitative detection of SARS-CoV-2  (COVID19) nucleic acid. Viral nucleic acids may persist in vivo,  independent of viability. Detection of viral nucleic acid does  not imply the presence of infectious virus, or that virus  nucleic acid is the cause of clinical symptoms. Negative  results should be treated as presumptive and, if inconsistent  with clinical signs and symptoms or necessary for patient  management, should be tested with an alternative molecular  assay. Negative results do not preclude SARS-CoV-2 infection  and should not be used as the sole basis for patient  management decisions. Invalid results may be due to inhibiting  substances in the specimen and recollection should occur.         Narrative:       o Collect and clearly label specimen type:  o Upper respiratory specimen: One Nasopharyngeal Dry Swab NO  Transport Media.  o Hand deliver to laboratory ASAP  Indication for testing->Medically necessary procedure/surgery  within 24 hours    CULTURE BLOOD AEROBIC AND ANAEROBIC [[id number]] Collected:  08/19/18 1505    Specimen:  Arterial Blood Updated:  08/24/18 2121    Narrative:       The order will result in two separate 8-33ml bottles  Please do NOT order repeat blood cultures if one has been  drawn within the last 48 hours.  AVOID BLOOD CULTURE DRAWS FROM CENTRAL LINE IF POSSIBLE  Indications:->Fever of greater than 101.5  ORDER#: [id number]                                    ORDERED BY: Gorden Harms  SOURCE: Arterial Blood                               COLLECTED:  08/19/18  15:05  ANTIBIOTICS AT COLL.:                                RECEIVED :  08/19/18 18:45  Culture Blood Aerobic and Anaerobic        FINAL       08/24/18 21:21  08/24/18   No growth after 5 days of incubation.      CULTURE BLOOD AEROBIC AND ANAEROBIC [[id number]] Collected:  08/19/18 1505    Specimen:  Blood, Venipuncture Updated:  08/24/18 2121    Narrative:       The order will result in two separate 8-64ml bottles  Please do NOT order repeat blood cultures if one has been  drawn within the last 48 hours.  AVOID BLOOD CULTURE DRAWS FROM CENTRAL LINE IF POSSIBLE  Indications:->Fever of greater than 101.5  ORDER#: [id number]  ORDERED BY: Gorden Harms  SOURCE: Blood, Venipuncture                          COLLECTED:  08/19/18 15:05  ANTIBIOTICS AT COLL.:                                RECEIVED :  08/19/18 18:45  Culture Blood Aerobic and Anaerobic        FINAL       08/24/18 21:21  08/24/18   No growth after 5 days of incubation.      MRSA culture - Nares (If not done in triage) [[id number]] Collected:  08/17/18 0621    Specimen:  Culturette from Nares Updated:  08/18/18 0255     Culture MRSA Surveillance Negative for Methicillin Resistant Staph aureus    MRSA culture - Throat (If not done in triage) [[id number]] Collected:  08/17/18 0621    Specimen:  Culturette from Throat Updated:  08/18/18 0255     Culture MRSA Surveillance Negative for Methicillin Resistant Staph aureus          Imaging, reviewed and are significant for:  No results found.      Signed by: Clydie Braun, PA

## 2018-08-26 NOTE — Anesthesia Preprocedure Evaluation (Addendum)
Anesthesia Evaluation    AIRWAY    Mallampati: II    TM distance: >3 FB  Neck ROM: full  Mouth Opening:full   CARDIOVASCULAR    regular       DENTAL    no notable dental hx     PULMONARY    pulmonary exam normal     OTHER FINDINGS    EF 21%            Relevant Problems   PULMONARY   (+) Dyspnea      CARDIO   (+) CHF (congestive heart failure)   (+) Cardiac arrest      ENDO   (+) Type 2 diabetes mellitus without complications   (+) Type II diabetes mellitus without mention of complication      OTHER   (+) Gout synovitis               Anesthesia Plan    ASA 4     general                     Detailed anesthesia plan: general IV      Post Op: telemetry    Post op pain management: per surgeon    informed consent obtained    Plan discussed with CRNA.      pertinent labs reviewed             Signed by: Sindy Guadeloupe [facility] 08/26/18 11:21 AM     ===============================================================  Inpatient Anesthesia Evaluation    Patient Name: Tara Perry, Tara Perry  Surgeon: Len Childs, MD  Patient Age / Sex: 73 y.o. 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/2018    per patient, he only sees peripheral view    Diabetes mellitus 1992    Gout spondylitis     Gout synovitis     Gout synovitis        Past Surgical History:   Procedure Laterality Date    COLONOSCOPY  2002    normal    EXPLORATORY LAPAROTOMY      LHC W/ CORONARY ANGIOS AND LV Left 08/21/2018    Procedure: LHC W/ CORONARY ANGIOS AND LV;  Surgeon: Burna Forts, MD;  Location: [facility];  Service: Cardiovascular;  Laterality: Left;         Allergies:     Allergies   Allergen Reactions    Shellfish-Derived Products Anaphylaxis         Medications:     Current Facility-Administered Medications   Medication Dose Route Frequency Last Rate Last Dose    acetaminophen (TYLENOL) 160  MG/5ML oral solution 650 mg  650 mg Oral Q6H PRN   650 mg at 08/25/18 1256    atorvastatin (LIPITOR) tablet 10 mg  10 mg Oral Daily   10 mg at 08/26/18 0804    carvedilol (COREG) tablet 25 mg  25 mg Oral Q12H SCH   25 mg at 08/26/18 0804    dextrose (GLUCOSE) 40 % oral gel 15 g of glucose  15 g of glucose Oral PRN        And    dextrose 50 % bolus 12.5 g  12.5 g Intravenous PRN        And    glucagon (  rDNA) (GLUCAGEN) injection 1 mg  1 mg Intramuscular PRN        dextrose (GLUCOSE) 40 % oral gel 15 g of glucose  15 g of glucose Oral PRN        And    dextrose 50 % bolus 12.5 g  12.5 g Intravenous PRN        And    glucagon (rDNA) (GLUCAGEN) injection 1 mg  1 mg Intramuscular PRN        heparin (porcine) injection 5,000 Units  5,000 Units Subcutaneous Q12H [facility]   Stopped at 08/26/18 0809    hydrALAZINE (APRESOLINE) injection 10 mg  10 mg Intravenous Q6H PRN   10 mg at 08/23/18 0927    insulin glargine (LANTUS) injection 14 Units  14 Units Subcutaneous QAM   14 Units at 08/26/18 0900    insulin lispro (HumaLOG) injection 1-3 Units  1-3 Units Subcutaneous QHS   2 Units at 08/25/18 2140    insulin lispro (HumaLOG) injection 1-5 Units  1-5 Units Subcutaneous TID AC   2 Units at 08/26/18 0804    lidocaine (LIDODERM) 5 % 2 patch  2 patch Transdermal Q24H   2 patch at 08/26/18 0805    potassium chloride (KLOR-CON) CR tablet 40 mEq  40 mEq Oral Once        potassium chloride (KLOR-CON) CR tablet 40 mEq  40 mEq Oral Once   Stopped at 08/26/18 0925    sacubitril-valsartan (ENTRESTO) 49-51 MG per tablet 1 tablet  49-51 mg Oral Q12H SCH   1 tablet at 08/26/18 1610    spironolactone (ALDACTONE) tablet 25 mg  25 mg Oral Daily   25 mg at 08/26/18 0804              Prior to Admission medications    Medication Sig Start Date End Date Taking? Authorizing Provider   aspirin 81 MG chewable tablet Chew 81 mg by mouth daily   Yes [provider]   sacubitril-valsartan (Entresto) 49-51 MG Tab per tablet Take 1  tablet by mouth 2 (two) times daily   Yes [provider]   atorvastatin (LIPITOR) 10 MG tablet Take 20 mg by mouth daily       [provider]   Carvedilol (COREG PO) Take by mouth.     [provider]   carvedilol (COREG) 25 MG tablet Take 25 mg by mouth 2 (two) times daily. Morning and nights    [provider]   estradiol (VAGIFEM) 25 MCG vaginal tablet Place 25 mcg vaginally daily.    [provider]   furosemide (LASIX) 20 MG tablet Take 1 tablet (20 mg total) by mouth as needed (for shortness of breath). 09/15/12   Al-Helou, Salley Scarlet, MD   glimepiride (AMARYL) 2 MG tablet Take 2 mg by mouth daily. At night    [provider]   lisinopril (PRINIVIL,ZESTRIL) 20 MG tablet Take 20 mg by mouth daily.      [provider]   metFORMIN (GLUCOPHAGE) 500 MG tablet Take 500 mg by mouth 2 (two) times daily. Morning and night    [provider]   Multiple Vitamins-Minerals (CENTRUM PO) Take by mouth.      [provider]   sitaGLIPtin (JANUVIA) 100 MG tablet Take 100 mg by mouth daily. At night     [provider]   vitamin B-12 (CYANOCOBALAMIN) 100 MCG tablet Take 1,000 mcg by mouth daily       [provider]     Vitals   Temp:  [36.4 C (97.5 F)-36.8 C (98.3 F)] 36.7 C (98.1 F)  Heart Rate:  [73-101] 75  Resp Rate:  [17-18] 18  BP: (110-159)/(55-77) 129/59    Wt Readings from Last 3 Encounters:   08/26/18 62.7 kg (138 lb 4.8 oz)   12/29/16 65.8 kg (145 lb)   09/13/12 62.6 kg (138 lb)     BMI (Estimated body mass index is 23.74 kg/m as calculated from the following:    Height as of this encounter: 1.626 m (5\' 4" ).    Weight as of this encounter: 62.7 kg (138 lb 4.8 oz).)  Temp Readings from Last 3 Encounters:   08/26/18 36.7 C (98.1 F) (Oral)   12/29/16 (!) 35.8 C (96.4 F) (Tympanic)   09/15/12 (!) 35.8 C (96.5 F) (Oral)     BP Readings from Last 3 Encounters:   08/26/18 129/59   12/29/16 138/63   09/15/12 129/62      Pulse Readings from Last 3 Encounters:   08/26/18 75   12/29/16 76   09/15/12 71           Labs:   CBC:  Lab Results   Component Value Date    WBC 12.36 (H) 08/26/2018    HGB 11.1 (L) 08/26/2018    HCT 33.4 (L) 08/26/2018    PLT 333 08/26/2018       Chemistries:  Lab Results   Component Value Date    NA 141 08/26/2018    K 3.7 08/26/2018    CL 109 08/26/2018    CO2 19 (L) 08/26/2018    BUN 36.0 (H) 08/26/2018    CREAT 1.2 (H) 08/26/2018    GLU 225 (H) 08/26/2018    HGBA1C 6.5 (H) 08/24/2018    CA 8.6 08/26/2018    AST 22 08/22/2018       Coags:  Lab Results   Component Value Date    PT 13.8 08/18/2018    PTT 34 08/18/2018    INR 1.1 08/18/2018     _____________________      Signed by: Sindy Guadeloupe Tara Perry  08/26/18   11:21 AM    =============================================================

## 2018-08-26 NOTE — OT Progress Note (Signed)
Occupational Therapy Note    [facility]   Occupational Therapy Cancellation Note      Patient:  Tara Perry MRN#:  [id number]  [facility]  [facility] Room/Bed:  FICAR/FICAR-09    08/26/2018      Patient not seen for occupational therapy secondary to pt off the floor for ICD placement. Will continue to follow.    Thank you,    Ester Rink Keturah Yerby OTR/L  Pager # [phone]

## 2018-08-26 NOTE — Plan of Care (Signed)
Problem: Moderate/High Fall Risk Score >5  Goal: Patient will remain free of falls  Outcome: Progressing     Problem: Hemodynamic Status: Cardiac  Goal: Stable vital signs and fluid balance  Outcome: Progressing     Problem: Inadequate Tissue Perfusion  Goal: Adequate tissue perfusion will be maintained  Outcome: Progressing     Problem: Ineffective Gas Exchange  Goal: Effective breathing pattern  Outcome: Progressing     Problem: Nutrition  Goal: Nutritional intake is adequate  Outcome: Progressing     Neuro: A/Ox4; pain to sternum, lidocaine patches     Vital Signs/Rhythm & Rate: SR w/bbb 80s-100s; SBP 140s-110s  Cardiac symptoms: N/A  Respiratory: RA; Clear, Dim   GI/GU/diet: Continent x2; NPO for ICD placement  Integumentary: WDL    Lines/Drains/Drips: Line will be replaced with AM labs; 20g lfa  Mobility: 1 assist w/walker      Plan:  ICD placement

## 2018-08-27 LAB — CBC AND DIFFERENTIAL
Absolute NRBC: 0 10*3/uL (ref 0.00–0.00)
Basophils Absolute Automated: 0.05 10*3/uL (ref 0.00–0.08)
Basophils Automated: 0.3 %
Eosinophils Absolute Automated: 0.08 10*3/uL (ref 0.00–0.44)
Eosinophils Automated: 0.5 %
Hematocrit: 31.8 % — ABNORMAL LOW (ref 34.7–43.7)
Hgb: 10.5 g/dL — ABNORMAL LOW (ref 11.4–14.8)
Immature Granulocytes Absolute: 0.1 10*3/uL — ABNORMAL HIGH (ref 0.00–0.07)
Immature Granulocytes: 0.7 %
Lymphocytes Absolute Automated: 1.8 10*3/uL (ref 0.42–3.22)
Lymphocytes Automated: 11.8 %
MCH: 30.1 pg (ref 25.1–33.5)
MCHC: 33 g/dL (ref 31.5–35.8)
MCV: 91.1 fL (ref 78.0–96.0)
MPV: 10.2 fL (ref 8.9–12.5)
Monocytes Absolute Automated: 1.5 10*3/uL — ABNORMAL HIGH (ref 0.21–0.85)
Monocytes: 9.8 %
Neutrophils Absolute: 11.7 10*3/uL — ABNORMAL HIGH (ref 1.10–6.33)
Neutrophils: 76.9 %
Nucleated RBC: 0 /100 WBC (ref 0.0–0.0)
Platelets: 263 10*3/uL (ref 142–346)
RBC: 3.49 10*6/uL — ABNORMAL LOW (ref 3.90–5.10)
RDW: 13 % (ref 11–15)
WBC: 15.23 10*3/uL — ABNORMAL HIGH (ref 3.10–9.50)

## 2018-08-27 LAB — GFR: EGFR: 43.9

## 2018-08-27 LAB — BASIC METABOLIC PANEL
BUN: 36 mg/dL — ABNORMAL HIGH (ref 7.0–19.0)
CO2: 17 mEq/L — ABNORMAL LOW (ref 22–29)
Calcium: 8.1 mg/dL (ref 7.9–10.2)
Chloride: 110 mEq/L (ref 100–111)
Creatinine: 1.2 mg/dL — ABNORMAL HIGH (ref 0.6–1.0)
Glucose: 246 mg/dL — ABNORMAL HIGH (ref 70–100)
Potassium: 4.2 mEq/L (ref 3.5–5.1)
Sodium: 139 mEq/L (ref 136–145)

## 2018-08-27 LAB — MAGNESIUM: Magnesium: 1.9 mg/dL (ref 1.6–2.6)

## 2018-08-27 LAB — GLUCOSE WHOLE BLOOD - POCT
Whole Blood Glucose POCT: 229 mg/dL — ABNORMAL HIGH (ref 70–100)
Whole Blood Glucose POCT: 265 mg/dL — ABNORMAL HIGH (ref 70–100)

## 2018-08-27 MED ORDER — SPIRONOLACTONE 25 MG PO TABS
25.0000 mg | ORAL_TABLET | Freq: Every day | ORAL | 0 refills | Status: DC
Start: 2018-08-28 — End: 2019-06-18

## 2018-08-27 MED ORDER — ACETAMINOPHEN 325 MG PO TABS
650.0000 mg | ORAL_TABLET | Freq: Four times a day (QID) | ORAL | 0 refills | Status: DC | PRN
Start: 2018-08-27 — End: 2019-06-18

## 2018-08-27 MED ORDER — METFORMIN HCL 500 MG PO TABS
500.0000 mg | ORAL_TABLET | Freq: Two times a day (BID) | ORAL | Status: DC
Start: 2018-08-28 — End: 2023-10-26

## 2018-08-27 NOTE — Progress Notes (Addendum)
[facility]   PROGRESS NOTE     [facility]       Date Time: 08/27/18 11:02 AM Patient Name: Tara Perry, Tara Perry  Medical Record #:  [id number] Account#:  [id number] Admission Date:  08/16/2018    Primary Electrophysiologist: Ferd Hibbs MD, [facility], [facility]    Primary Cardiologist: Timmothy Euler MD, [facility]     EP Attending Impressions:    My a/p as below. Incision cdi. cxr reviewed. Normal bivicd function as below. Ok for home today from ep standpoint, with f/u as outlined below.  Thanks, Particia Lather, MD  [facility]   [phone]; [phone] FFX Spectralink.       Assessment:    BIBA 5 Jun 20 following, witnessed out of hospital cardiac arrest.  On EMS arrival (per notes), found to be in VF received Defibrillation x 3 @ 150J and 1mg  Epi with CPR and had ROSC.    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 Marianna Fuss.   Device interrogation listed below.   S/P LHC 11 Jun 20 with Dr. Idolina Primer - No coronary disease, severe LV dysfunction.   History of Non-ischemic cardiomyopathy with EF as low as 25% in 2017 improved to 40% by Oct 2017 with GMDT, now 21% post arrest.   NYHA II-III, on GDMT>90days, LBBB with QRS >170msec, not on ACEi as she is on entresto.      Echo 8 Jun 20 - EF 21%, mild LVE, dyskinesis of basal, mid anterior, anteroseptal, and inferoseptal walls, hypokinesis of basal inferior wall, Mild MR, mild TR   HTN   DM II    Chronic LBBB with QRS>120smec   AKI    Leukocytosis (PNA) BCx NGTD.    Recommendations:    Ok for discharge from EP perspective.   Follow-up site check in 1 week, and new Medtronic BiV ICD check in 4-6 weeks.  Our schedulers will call for this appointment.   Continue current medications.   Continue outstanding supportive care.   [facility] Team will sign off at this time, but please contact us at the numbers listed below should we need to re-engage.    Medications:      Scheduled Meds:     atorvastatin, 10 mg, Oral, Daily  carvedilol, 25 mg, Oral, Q12H SCH  heparin (porcine), 5,000 Units, Subcutaneous, Q12H SCH  insulin glargine, 14 Units, Subcutaneous, QAM  insulin lispro, 1-3 Units, Subcutaneous, QHS  insulin lispro, 1-5 Units, Subcutaneous, TID AC  lidocaine, 2 patch, Transdermal, Q24H  potassium chloride, 40 mEq, Oral, Once  potassium chloride, 40 mEq, Oral, Daily  sacubitril-valsartan, 49-51 mg, Oral, Q12H SCH  spironolactone, 25 mg, Oral, Daily        Continuous Infusions:       Subjective:   Doing well overnight, some intermittent chest discomfort d/t CPR, relieved with Lidocaine patch.    Denies palpitations, SOB, lightheadedness, dizziness, near syncope.     Physical Exam:     VITAL SIGNS PHYSICAL EXAM   Temp:  [97.9 F (36.6 C)-98.1 F (36.7 C)] 97.9 F (36.6 C)  Heart Rate:  [70-96] 74  Resp Rate:  [12-18] 18  BP: (121-164)/(58-70) 157/70  Temp (24hrs), Avg:98 F (36.7 C), Min:97.9 F (36.6 C), Max:98.1 F (36.7 C)          Intake/Output Summary (Last 24 hours) at 08/27/2018 1102  Last data filed at 08/26/2018 1509  Gross per 24 hour   Intake 400 ml  Output 3 ml   Net 397 ml    Physical Exam  General: awake, alert, breathing comfortable, no acute distress  Head: normocephalic  Eyes: EOM's intact  Cardiovascular: RRR, normal S1 S2, no S3 or S4, no rubs appreciated.   Neck: no carotid bruits or JVD  Lungs: clear to auscultation bilaterally, without wheezing, rhonchi, or rales  Abdomen: soft, non-tender, non-distended; normoactive bowel sounds  Extremities: no edema  Pulse: Bilateral radial pulses 2+   Neurological: Alert and oriented X3.  Psych: mood and affect normal  Musculoskeletal:  Good strength and tone  Skin: clean, dry, no masses or lesions.     Labs:     Recent Labs   Lab 08/27/18  0522   Magnesium 1.9     Recent Labs   Lab 08/27/18  0522 08/26/18  0528 08/25/18  0412   WBC 15.23* 12.36* 12.36*   Hgb 10.5* 11.1* 10.5*   Hematocrit 31.8* 33.4* 32.6*   Platelets 263 333 287      Recent Labs   Lab 08/27/18  0522 08/26/18  0528 08/25/18  0412   Sodium 139 141 143   Potassium 4.2 3.7 3.6   Chloride 110 109 111   CO2 17* 19* 19*   BUN 36.0* 36.0* 39.0*   Creatinine 1.2* 1.2* 1.2*   EGFR 43.9 43.9 43.9   Glucose 246* 225* 214*   Calcium 8.1 8.6 8.5     Recent Labs   Lab 08/22/18  0431   Bilirubin, Total 0.5   Bilirubin, Direct 0.3   Protein, Total 5.5*   Albumin 2.8*   ALT 34   AST (SGOT) 22       Estimated Creatinine Clearance: 36.1 mL/min (A) (based on SCr of 1.2 mg/dL (H)).  Diagnostics:   Telemetry: As/pBIVp/s with HR 70-110's overnight.     Device Interrogation 16 Jun 20:  Manufacturer: Medtronic  Model: Claria MRI Quad CRT-D   Implant date: 15 Jun 20   Post Op date: 1    Reason for evaluation: POD 1    Device Parameters  Mode: DDD  Lower rate: 60 bpm.  Upper rate: 130 bpm  Atrial output:  3.5 V @  0.4 ms  Atrial sensitivity: 0.3 mV  Ventricular output:   RV: 3.5 V @  0.4 ms             LV: 2.5 V @  0.4 ms  Ventricular sensitivity: RV:  0.3 mV    Device Measurements  Atrial lead impedance: 361 ohms  Atrial capture threshold: 0.5 V @ 0.4 ms   Atrial sensing - P wave: 2.1 mV  Ventricular lead impedance:   RV: 513 ohms         RV Defib: 57 ohms                  LV:  475 ohms  Ventricular capture threshold:   RV: 0.5 V @ 0.4 ms         LV: 1.0 V @ 0.4 ms   Ventricular sensing - R wave: 17.1 mV  Underlying rhythm: SR with LBBB    Changes/Observations  Observations/Arrhythmias: Normal BiV ICD function. No arrhythmias.  Stable lead parameters.  Battery life initializing.  0.3%Ap, 98.3%BiVp.  Changes made: No changes made today.      Incision:  C/D/I without hematoma or discharge.     CXR:  15 Jun 20 - No acute findings post ICD placement. No pneumothorax.  (image personally reviewed by JOPA)  Signed by     Theo Dills, PA-C  Cardiac Electrophysiology  [facility]  [facility]    Arrhythmia Spectralink [phone] (8am-4:30pm)  After hours, non urgent consult line [phone]  After  Hours, urgent consults [phone]  For EP questions or consults during the week, easiest to call Spectralink [phone]   You can also reach [place] 24/7 through our main EP office [phone].

## 2018-08-27 NOTE — SLP Progress Note (Signed)
[facility]   SLP Treatment Note  [name]    MRN#: [id number]     Treatment Type: dysphagia    Recommendations/Plan:   - Diet Recommendations: mechanical soft solids, thin liquids  - Medication Route: whole in puree  - Aspiration Precautions: upright @ 90 degrees, slow rate, SMALL sips 1 at a time, NO STRAWS  -Continue SLP services for dysphagia     SLP Frequency Recommended: 2-3x/wk    Discharge recommendations: Defer to PT/OT recommendation, Home with supervision/assistance, Home speech, Outpatient speech    Assessment:   Patient continues to demonstrate improvement in swallow function this session, presents with mild oral and suspect pharyngeal dysphagia.  Patient demonstrates good use of safe swallow strategies. Oral phase mildly prolonged.  Pharyngeal phase delayed with reduced hyolaryngeal elevation to palpation.  Cough with large sequential thin sips only. No other overt s/s aspiration.  Voice clear and dry.  SLP recommends mechanical soft solids and thin liquids with strict aspiration precautions listed above.  Meds whole in applesauce.  D/C PO and notify SLP with any s/s aspiration.  SLP will follow.  Subjective:   Pain: "moderate" pan from CPR and surgery site (did not want to quantify with a number); RN/PA present/aware  Current Diet: Puree/honey (last SLP rec MA/nectar)  Respiratory Status: RA  Precautions: fall and aspiration  Interpreter services: N/A  Intubated 6/5-6/9    X-ray Chest Ap Portable    Result Date: 08/26/2018    No acute findings post ICD placement. Geanie Cooley, MD  08/26/2018 4:25 PM         RN cleared patient for session; reports patient with some increased coughing in general. Patient is alert and agreeable to session. No  family at bedside.     Objective:   Oral care/assesment: moist and pink oropharynx; no dysphonia, voice dry   PO trials provided: ice chips x4, thin tsp/cup/straw x3 oz, puree x3, regular solid x5  Oral phase: adequate bolus acceptance,  adequate lip seal, improved oral control, mildly prolonged mastication, no oral residue, patient demonstrates good use of small bites/sips 1 at a time   Pharyngeal phase (on palpation): mildly delayed swallow initiation, diminished hyolaryngeal elevation, single swallow per bolus  S/s aspiration: cough with large sequential thin sips only  Vitals: stable    Patient Education: verbal/written; patient voiced understanding; swallow guide posted    Patient left with call bell within reach, all needs met, fall mat in place, bed alarm activated and all questions answered. RN notified of session outcome and patient response. Discussed with PA    Goals:   1. Patient will tolerate 5/5 ice chips trials without s/s aspiration. MET  2. Patient will tolerate 5/5 liquid trials without s/s aspiration. MET  3. Patient will tolerate mech altered/nectar for 24-48 hrs with no overt s/s aspiration. D/c, never ordered  4. Patient will tolerate mechanical soft solids/thin liquids for 24-48 hrs with no overt s/s aspiration.- new goal  5. Patient will demonstrate safe swallow strategies 90% acc indep- new goal      Geraldo Docker MA, CCC-SLP  08/27/2018        Time of Treatment:  SLP Received On: 08/27/18  Start Time: 0935  Stop Time: 1005  Time Calculation (min): 30 min

## 2018-08-27 NOTE — Plan of Care (Addendum)
Pt education reviewed using the CHF zones, ICD booklet and frequently used medication sheet. Pt made aware of the purpose of each medication as prescribed by MD and possible side effects that require immediate MD attention.    CHF warning zones reviewed: yes  Daily weights: yes  Strict I/Os: yes  Fluid restriction: none ordered  Heart healthy diet: low sodium diet reviewed    ACE/ARB reviewed: Entresto reviewed  Beta blocker reviewed: yes  Echo/EF: 20%  Follow up Cardiology appt: [facility] will facilitate    Dry weight: last weight on file 139 lbs    ICD site care reviewed at length with the pt.    Site care: reviewed  Signs of infection: reviewed  Who to call if fever/infection: reviewed  Arm precautions: reviewed  Importance of carrying the ICD ID card: reviewed  Bathing instructions: reviewed  Follow up with EP MD: reviewed  Use of electronics: reviewed    Addendum @ 1230: Notified by [facility] liaison that pt does not have a PCP and has not seen Dr Jacqlyn Larsen for a long time. Pt needs a BMP in one week. Pt needs PCP for HH orders. HH liaison reached out to pt daughter and will update the case manager for the PCP referral.    Jorene Guest, RN, MSN, PCCN-K  Patient Care Navigator, [place]  [phone]

## 2018-08-27 NOTE — Discharge Summary (Signed)
Discharge Summary    Date:08/27/2018   Patient Name: Tara Perry  Attending Physician: Dani Gobble, MD    Date of Admission:   08/16/2018    Date of Discharge:   08/27/2018    Admitting Diagnosis:   VF arrest    Discharge Dx:     Principal Diagnosis (Diagnosis after study, that is chiefly responsible for admission to inpatient status):     1. VF arrest sp BiV ICD 08/26/2018  2. NICM, EF 20%  3. Elevated troponin, demand ischemia secondary to VF arrest  4. Hypoxic/anoxic respiratory failure, extubated 08/20/2018.  5. Dyphagia secondary to encephalopathy, cleared for regular diet with thins at discharge.  6. T2DM  7. Hypernatremia  8. AKI on CKD3 2/2 prerenal in the setting of VF arrest    Treatment Team:   Treatment Team:   Attending Provider: Dani Gobble, MD  Resident: Melton Krebs, MD  Consulting Physician: Dani Gobble, MD     Procedures performed:   Radiology: all results from this admission  Ct Head Wo Contrast    Result Date: 08/17/2018  No acute intracranial abnormality. Eloise Harman, MD  08/17/2018 12:36 AM    X-ray Chest Ap Portable    Result Date: 08/26/2018    No acute findings post ICD placement. Geanie Cooley, MD  08/26/2018 4:25 PM    Xr Chest Ap Portable    Result Date: 08/19/2018  Lines and tubes as described without pneumothorax. Bibasilar atelectasis without focal consolidation identified. Colonel Bald, MD  08/19/2018 12:35 PM    Xr Chest Ap Portable    Result Date: 08/17/2018   Right IJ central venous catheter has been pulled back with tip now at the cavoatrial junction. Shelly Flatten, MD  08/17/2018 6:29 PM    Xr Chest Ap Portable    Result Date: 08/17/2018   1. Right IJ central venous catheter placement with tip in the right atrium. 2. Retrocardiac opacity may represent atelectasis or effusion. Shelly Flatten, MD  08/17/2018 5:28 PM    Chest Ap Portable    Result Date: 08/17/2018   1. ET tube tip 3.2 cm above the carina and gastric drainage tube sidehole in the stomach and tip below the  field-of-view. 2. Central vascular congestion, likely upper lobe edema, and bibasilar atelectasis. Infection/aspiration cannot be entirely excluded. 3. Borderline enlarged cardiopericardial silhouette. Eloise Harman, MD  08/17/2018 12:11 AM    Surgery: all results from this admission  Procedure(s) with comments:  ICD IMPLANT BIV (N/A) - medtronic    Reason for Admission:   Per H&P:  Tara Perry is a 73 y.o. female with a PMH of viral cardiomyopathy and secondary CHF and Type II DM who presents with VFib arrest.    Patient was at home sitting on a chair surrounded by family when she stood up then sat back down, became SOB and then completely unresponsive. Family called 911 and they were told to start CPR. Per family it took about 10 seconds to get started. EMS arrived and patient received three shocks and 1mg  of Epinephrine until ROSC was achieved, after about 10 minutes.     Patient's family states that she was in her usual state of health and had not been complaining of any symptoms beforehand. She did not have any lower extremity edema that could have indicated clots.     Patient was very independent at baseline and did not even use a walker or cane to ambulate. She is able to do basic  housework.     Dr. Nash Shearer is her Cardiologist and she has been following up regularly. Family cannot recall when her last echocardiogram was.    Hospital Course:     This is a 73 year old woman with a history ofT2DM and HFrEF with prior transient recoverywho presented 6/5 via EMS after witnessed ventricular fibrillation arrest at home. She received 3 shocks, 1 round of epi with successful ROSC. She was intubated for airway protection and underwent therapeutic hypothermia protocol due to poor mental status.  Echo showed EF of 20%. LHC was negative for coronary artery disease. Mental status improved after rewarming and successful extubation prior to transfer to PCCU. Patient had BiV ICD placed on 08/26/2018 successfully.  Her  WBC count was mildly elevated the next morning which was likely reactive. She was afebrile, tolerating room air. CXR was negative for consolidation or other acute process. Incision from ICD was beautifully approximated with no hematoma or drainage.  Patient and family refused acute rehab due to concern for COVID and instead chose home health.  Family will stay with the patient in her home until she is able to safely do her ADL's.     Attempted to contact PCP, left message with office [on emergency doc line, 6/16 @ 12:18p].      Condition at Discharge:   Stable.     Today:     BP 144/73    Pulse 83    Temp 97.9 F (36.6 C) (Oral)    Resp 18    Ht 1.626 m (5\' 4" )    Wt 63.4 kg (139 lb 11.2 oz)    SpO2 93%    BMI 23.98 kg/m      Gen: NAD, ao x 3  Heart: rrr, no murmurs or rubs. Incision from ICD c/d/i  Lungs: CTA, no wheezing or rhonchi.   Abd: soft, NT, ND, normoactive bowel sounds.   Extrems: no edema.    Ranges for the last 24 hours:  Temp:  [97.9 F (36.6 C)-98.1 F (36.7 C)] 97.9 F (36.6 C)  Heart Rate:  [70-96] 83  Resp Rate:  [12-18] 18  BP: (121-164)/(58-73) 144/73    Last set of labs   Recent Labs   Lab 08/27/18  0522   WBC 15.23*   Hgb 10.5*   Hematocrit 31.8*   Platelets 263     Recent Labs   Lab 08/27/18  0522   Sodium 139   Potassium 4.2   Chloride 110   CO2 17*   BUN 36.0*   Creatinine 1.2*   EGFR 43.9   Glucose 246*   Calcium 8.1     Recent Labs   Lab 08/22/18  0431   Bilirubin, Total 0.5   Bilirubin, Direct 0.3   Protein, Total 5.5*   Albumin 2.8*   ALT 34   AST (SGOT) 22               Invalid input(s): FREET4      Recent Labs   Lab 08/21/18  0810 08/21/18  0118 08/20/18  2138   Troponin I 0.60* 0.19* 0.06*     Microbiology Results     Procedure Component Value Units Date/Time    Blood Culture Aerobic/Anaerobic #1 [[id number]] Collected:  08/17/18 0105    Specimen:  Arm from Blood, Venipuncture Updated:  08/22/18 0621    Narrative:       ORDER#: [id number]  ORDERED  BY: COLEMAN, PATRIC  SOURCE: Blood, Venipuncture steripath                COLLECTED:  08/17/18 01:05  ANTIBIOTICS AT COLL.:                                RECEIVED :  08/17/18 03:28  Culture Blood Aerobic and Anaerobic        FINAL       08/22/18 06:21  08/22/18   No growth after 5 days of incubation.      Blood Culture Aerobic/Anaerobic #2 [[id number]] Collected:  08/17/18 0216    Specimen:  Arm from Blood, Venipuncture Updated:  08/22/18 0621    Narrative:       ORDER#: [id number]                                    ORDERED BY: Effie Shy, PATRIC  SOURCE: Blood, Venipuncture steripath  L Ac          COLLECTED:  08/17/18 02:16  ANTIBIOTICS AT COLL.:                                RECEIVED :  08/17/18 03:28  Culture Blood Aerobic and Anaerobic        FINAL       08/22/18 06:21  08/22/18   No growth after 5 days of incubation.      COVID-19 (SARS-CoV-2) [facility]) [[id number]] Collected:  08/17/18 0157    Specimen:  Nasopharyngeal Swab Updated:  08/17/18 0323     Purpose of COVID testing Screening     SARS-CoV-2 Specimen Source Nasopharyngeal     SARS CoV 2 Overall Result Negative     Comment: Test performed using the Abbott ID NOW EUA assay.  Please see Fact Sheets for patients and providers located at:  http://www.rice.biz/  This test is for the qualitative detection of SARS-CoV-2  (COVID-19)nucleic acid. Viral nucleic acids may persist in vivo,  independent of viability. Detection of viral nucleic acid does  not imply the presence of infectious virus, or that virus  nucleic acid is the cause of clinical symptoms.  Negative results do not preclude SARS-CoV-2 infection and  should not be used as the sole basis for patient management  decisions.  Invalid results may be due to inhibiting substances in the  specimen and recollection should occur.         Narrative:       o Collect and clearly label specimen type:  o Upper respiratory specimen: One Nasopharyngeal Dry Swab NO  Transport Media.  o Hand  deliver to laboratory ASAP    COVID-19 (SARS-CoV-2) ([facility])- Medically necessary procedure/surgery within 24 hours (no isolation) [[id number]] Collected:  08/26/18 0635    Specimen:  Nasopharyngeal Swab Updated:  08/26/18 0716     Purpose of COVID testing Screening     SARS-CoV-2 Specimen Source Nasopharyngeal     SARS CoV 2 Overall Result Negative     Comment: Test performed using the Abbott ID NOW EUA assay.  Please see Fact Sheets for patients and providers located at:  http://www.rice.biz/  This test is for the qualitative detection of SARS-CoV-2  (COVID19) nucleic acid. Viral nucleic acids may persist in vivo,  independent of viability. Detection of viral nucleic  acid does  not imply the presence of infectious virus, or that virus  nucleic acid is the cause of clinical symptoms. Negative  results should be treated as presumptive and, if inconsistent  with clinical signs and symptoms or necessary for patient  management, should be tested with an alternative molecular  assay. Negative results do not preclude SARS-CoV-2 infection  and should not be used as the sole basis for patient  management decisions. Invalid results may be due to inhibiting  substances in the specimen and recollection should occur.         Narrative:       o Collect and clearly label specimen type:  o Upper respiratory specimen: One Nasopharyngeal Dry Swab NO  Transport Media.  o Hand deliver to laboratory ASAP  Indication for testing->Medically necessary procedure/surgery  within 24 hours    CULTURE BLOOD AEROBIC AND ANAEROBIC [[id number]] Collected:  08/19/18 1505    Specimen:  Arterial Blood Updated:  08/24/18 2121    Narrative:       The order will result in two separate 8-71ml bottles  Please do NOT order repeat blood cultures if one has been  drawn within the last 48 hours.  AVOID BLOOD CULTURE DRAWS FROM CENTRAL LINE IF POSSIBLE  Indications:->Fever of greater than 101.5  ORDER#: [id number]                                     ORDERED BY: Gorden Harms  SOURCE: Arterial Blood                               COLLECTED:  08/19/18 15:05  ANTIBIOTICS AT COLL.:                                RECEIVED :  08/19/18 18:45  Culture Blood Aerobic and Anaerobic        FINAL       08/24/18 21:21  08/24/18   No growth after 5 days of incubation.      CULTURE BLOOD AEROBIC AND ANAEROBIC [[id number]] Collected:  08/19/18 1505    Specimen:  Blood, Venipuncture Updated:  08/24/18 2121    Narrative:       The order will result in two separate 8-28ml bottles  Please do NOT order repeat blood cultures if one has been  drawn within the last 48 hours.  AVOID BLOOD CULTURE DRAWS FROM CENTRAL LINE IF POSSIBLE  Indications:->Fever of greater than 101.5  ORDER#: [id number]                                    ORDERED BY: Gorden Harms  SOURCE: Blood, Venipuncture                          COLLECTED:  08/19/18 15:05  ANTIBIOTICS AT COLL.:                                RECEIVED :  08/19/18 18:45  Culture Blood Aerobic and Anaerobic        FINAL       08/24/18 21:21  08/24/18   No growth after  5 days of incubation.      MRSA culture - Nares (If not done in triage) [[id number]] Collected:  08/17/18 0621    Specimen:  Culturette from Nares Updated:  08/18/18 0255     Culture MRSA Surveillance Negative for Methicillin Resistant Staph aureus    MRSA culture - Throat (If not done in triage) [[id number]] Collected:  08/17/18 0621    Specimen:  Culturette from Throat Updated:  08/18/18 0255     Culture MRSA Surveillance Negative for Methicillin Resistant Staph aureus            Micro / Labs / Path pending:     Unresulted Labs     Procedure . . . Date/Time    ICD Implant BiV [[id number]] Resulted:  08/26/18 1048     Updated:  08/26/18 1538    Calcium, ionized [[id number]] Collected:  08/19/18 0522    Specimen:  Blood Updated:  08/19/18 0522    Narrative:       SST also acceptable    Magnesium [[id number]] Collected:  08/18/18 1533    Specimen:  Blood Updated:  08/18/18 1534     Calcium, ionized [[id number]] Collected:  08/18/18 0116    Specimen:  Blood Updated:  08/18/18 0116    Narrative:       SST also acceptable    Blood gas, arterial [[id number]] Collected:  08/18/18 0116    Specimen:  Blood, Arterial Updated:  08/18/18 0116    Blood gas, arterial [[id number]] Collected:  08/18/18 0116    Specimen:  Blood, Arterial Updated:  08/18/18 0116    Calcium, ionized [[id number]] Collected:  08/17/18 1150    Specimen:  Blood Updated:  08/17/18 1150    Narrative:       SST also acceptable    Troponin I [[id number]] Collected:  08/17/18 0621    Specimen:  Blood Updated:  08/17/18 6010            Discharge Instructions:     Follow-up Information     [facility]. Call in 2 days.    Why:  As needed if no contact from home health nurse  Contact information:  [address]  [place] [address]  [phone]           Baldwin Crown, MD Follow up.    Specialties:  Clinical Cardiac Electrophysiology, Cardiology  Why:  Follow-up in 1 week for site check, and in 4-6 weeks for New Medtronic BiV ICD check.  Our schedulers will call for these appointments.   Contact information:  [address]  [address] [place] [address]  [phone]             Lelan Pons, MD Follow up.    Specialty:  Cardiology  Why:  cardiology follow up   Contact information:  [address]  [place] [address]  [phone]                      Reason for Admission:  Ventricular Fibrillation Cardiac Arrest    Restrictions on Discharge:  No reaching overhead or behind you on the Left side for 1 month.  No heavy lifting (>10lbs) on the Left side for 1 month.  No strenuous physical activity for 1 month.  No soaking activities (baths, hot tubs, swimming pools) for 1 month.  You may shower.  You may walk for physical activity.         Disposition:  Home or Self Care     Discharge Medication List      Taking    acetaminophen 325 MG tablet  Dose:  650 mg  Commonly known as:  TYLENOL  Take 2 tablets (650 mg  total) by mouth every 6 (six) hours as needed for Pain     atorvastatin 10 MG tablet  Dose:  20 mg  Commonly known as:  LIPITOR  Take 20 mg by mouth daily      carvedilol 25 MG tablet  Dose:  25 mg  What changed:  Another medication with the same name was removed. Continue taking this medication, and follow the directions you see here.  Commonly known as:  COREG  Take 25 mg by mouth 2 (two) times daily. Morning and nights     CENTRUM PO  Take by mouth.     Entresto 49-51 MG Tabs per tablet  Dose:  1 tablet  Generic drug:  sacubitril-valsartan  Take 1 tablet by mouth 2 (two) times daily     metFORMIN 500 MG tablet  Dose:  500 mg  Commonly known as:  GLUCOPHAGE  For:  Type 2 Diabetes  Start taking on:  August 28, 2018  Take 1 tablet (500 mg total) by mouth 2 (two) times daily Morning and night     SITagliptin 100 MG tablet  Dose:  100 mg  Commonly known as:  JANUVIA  Take 100 mg by mouth daily. At night     spironolactone 25 MG tablet  Dose:  25 mg  Commonly known as:  ALDACTONE  Start taking on:  August 28, 2018  Take 1 tablet (25 mg total) by mouth daily     vitamin B-12 100 MCG tablet  Dose:  1,000 mcg  Commonly known as:  CYANOCOBALAMIN  Take 1,000 mcg by mouth daily         STOP taking these medications    aspirin 81 MG chewable tablet     estradiol 25 MCG vaginal tablet  Commonly known as:  VAGIFEM     furosemide 20 MG tablet  Commonly known as:  LASIX     glimepiride 2 MG tablet  Commonly known as:  AMARYL     lisinopril 20 MG tablet  Commonly known as:  ZESTRIL          Minutes spent coordinating discharge and reviewing discharge plan: 40 minutes      Signed by: Clydie Braun, PA

## 2018-08-27 NOTE — Progress Notes (Addendum)
[facility] [facility] PROGRESS NOTE  [facility]      Date Time: 08/27/18 9:58 AM  Patient Name: Tara Perry, Tara Perry  Medical Record #:  [id number]  Account#:  [id number]  Admission Date:  08/16/2018         Patient Active Problem List   Diagnosis    Peritonitis    Viral cardiomyopathy    Type II diabetes mellitus without mention of complication    Gout synovitis    Pulmonary edema    Type 2 diabetes mellitus without complications    SIRS (systemic inflammatory response syndrome)    Viral cardiomyopathy    Dyspnea    CHF (congestive heart failure)    Cardiac arrest       Subjective:   Denies chest pain, SOB or palpitations. Has some discomfort from chest compressions states lidocaine patch helping.       Assessment:    VF arrest, witnessed with bystander CPR performed, resuscitated with 3 defibrillations, 1 ampule of epinephrine given   NICM: EF 20% on 08/19/2018   Cardiac cath 08-21-18  - no CAD   S/p MDT BiVICD 08/26/18   Elevated troponin 0.6 demand ischemia - Cath 08/21/18 with no CAD   LBBB, chronic   Hypothermia protocol completed on 08/19/2018   Acute kidney injury   Diabetes mellitus   Leukocytosis(pneumonia): bcx No growth so far  Recommendations:    Continue GDMT with Coreg, Entresto, and spironolactone.   Ok for discharge from a general cardiology standpoint. Will arrange 3-5 day telemedicine follow up.     Patient seen and examined, my assessment and plan as above.  Lungs are clear, ICD site is clean, okay for discharge with plan as above         Medications:      Scheduled Meds:    atorvastatin, 10 mg, Oral, Daily  carvedilol, 25 mg, Oral, Q12H SCH  heparin (porcine), 5,000 Units, Subcutaneous, Q12H SCH  insulin glargine, 14 Units, Subcutaneous, QAM  insulin lispro, 1-3 Units, Subcutaneous, QHS  insulin lispro, 1-5 Units, Subcutaneous, TID AC  lidocaine, 2 patch, Transdermal, Q24H  potassium chloride, 40 mEq, Oral, Once  potassium chloride, 40 mEq, Oral, Daily  sacubitril-valsartan, 49-51 mg,  Oral, Q12H SCH  spironolactone, 25 mg, Oral, Daily        Continuous Infusions:           Physical Exam:       VITAL SIGNS PHYSICAL EXAM   Vitals:    08/27/18 0840   BP: 157/70   Pulse: 74   Resp:    Temp:    SpO2: 95%     Temp (24hrs), Avg:98 F (36.7 C), Min:97.9 F (36.6 C), Max:98.1 F (36.7 C)      Telemetry: Reviewed      Intake/Output Summary (Last 24 hours) at 08/27/2018 0958  Last data filed at 08/26/2018 1509  Gross per 24 hour   Intake 400 ml   Output 3 ml   Net 397 ml    Physical Exam  General: awake, alert, breathing comfortably, no acute distress  Head: normocephalic  Eyes: EOM's intact  Cardiovascular: regular rate and rhythm, normal S1, S2, no S3, no S4, no murmurs, rubs or gallops. ICD site well approximated with dermabond. No redness, drainage, or swelling.    Neck: no JVD  Lungs: Clear to auscultation.   Abdomen: soft, non-tender, non-distended; no palpable masses,  normoactive bowel sounds  Extremities: no edema  Pulse: equal pulses, 4/4 symmetric  Neurological: Alert and oriented  X3, mood and affect normal  Musculoskeletal: normal strength and tone         Labs:     Recent Labs   Lab 08/21/18  0810 08/21/18  0118 08/20/18  2138   Troponin I 0.60* 0.19* 0.06*             Recent Labs   Lab 08/22/18  0431   Bilirubin, Total 0.5   Bilirubin, Direct 0.3   Protein, Total 5.5*   Albumin 2.8*   ALT 34   AST (SGOT) 22     Recent Labs   Lab 08/27/18  0522   Magnesium 1.9         Recent Labs   Lab 08/27/18  0522 08/26/18  0528 08/25/18  0412   WBC 15.23* 12.36* 12.36*   Hgb 10.5* 11.1* 10.5*   Hematocrit 31.8* 33.4* 32.6*   Platelets 263 333 287     Recent Labs   Lab 08/27/18  0522 08/26/18  0528 08/25/18  0412   Sodium 139 141 143   Potassium 4.2 3.7 3.6   Chloride 110 109 111   CO2 17* 19* 19*   BUN 36.0* 36.0* 39.0*   Creatinine 1.2* 1.2* 1.2*   EGFR 43.9 43.9 43.9   Glucose 246* 225* 214*   Calcium 8.1 8.6 8.5           Invalid input(s): FREET4    .  Lab Results   Component Value Date    BNP 139 (H)  08/16/2018        Weight Monitoring 08/21/2018 08/22/2018 08/23/2018 08/24/2018 08/25/2018 08/26/2018 08/27/2018   Height - - - - 162.6 cm - -   Height Method - - - - Stated - -   Weight 71.3 kg 67.5 kg 66.2 kg 64.501 kg 63.413 kg 62.732 kg 63.368 kg   Weight Method - Bed Scale - Bed Scale Standing Scale Standing Scale Standing Scale   BMI (calculated) - - - - 24 kg/m2 - -       Imaging:       ____________________________________________    Signed by: Queen Slough, NP      [facility]  NP Spectralink [phone] (8am-5pm)  MD Spectralink [phone] or [phone] (8am-5pm)  Arrhythmia Spectralink [phone] (8am-4:30pm)  After hours, non urgent consult line [phone]  After Hours, urgent consults [phone]

## 2018-08-27 NOTE — Progress Notes (Signed)
[facility] ([phone])  Rolling Walker delivered to patients room

## 2018-08-27 NOTE — Plan of Care (Signed)
Neuro: A/Ox4, intermittent confusions, easily reoriented; Pain in chest, lidocaine and tylenol given      Vital Signs/Rhythm & Rate:ICD fires, V paced on tele 60s-80s; BP 120s-130s; Afebrile   Cardiac symptoms: N/a   Respiratory:  RA; Clear Diminished; Slight cough, PRN cough med ordered    GI/GU/diet: Dysphagia diet/Nectar thick; Continent x2  Integumentary: Incision site L.shoulder     Lines/Drains/Drips: 22 G RFA  Mobility: 1-2 assist w/walker     Plan:  SLP eval?  Monitor HR/Rhyhtm/Labs  [place]? If [place] family requests that DCing RN give instructions to both pt and family

## 2018-08-27 NOTE — Progress Notes (Addendum)
Home Health Referral          Referral from Dorothea Ogle (Case Manager) for home health care upon discharge.    By [employer], the patient has the right to freely choose a home care provider.  Arrangements have been made with:     A company of the patients choosing. We have supplied the patient with a listing of providers in your area who asked to be included and participate in Medicare.   [facility], formerly  [facility], a home care agency that provides adult home care services and participates in Medicare   The preferred provider of your insurance company. Choosing a home care provider other than your insurance company's preferred provider may affect your insurance coverage.      Home Health Discharge Information     Your doctor has ordered Skilled Nursing, Physical Therapy, Occupational Therapy, Speech and Language Therapy and Home Health Aide in-home service(s) for you while you recuperate at home, to assist you in the transition from hospital to home.      The agency that you or your representative chose to provide the service:  Name of Home Health Agency Placement:  Home Health]  [phone]    DME ordered:  Levan Hurst   The above services were set up by:  Jason Fila  [facility] Liaison)   Phone  [phone]    IF YOU HAVE NOT HEARD FROM YOUR HOME YOUR HOME HEALTH AGENCY WITHIN 24-48 HOURS AFTER DISCHARGE PLEASE CALL YOUR AGENCY TO ARRANGE A TIME FOR YOUR FIRST VISIT. FOR ANY SCHEDULING CONCERNS OR QUESTIONS RELATED TO HOME HEALTH, SUCH AS TIME OR DATE PLEASE CONTACT YOUR HOME HEALTH AGENCY AT THE NUMBER LISTED ABOVE.    Additional Comments:  Patient declining Acute Rehab due to COVID. Patients daughter states they already have a WC and will decline the [facility] at this time.       HOME HEALTH REFERRAL    PATIENT"S DEMOGRAPHICS:        Name: Tara Perry    Discharge Address: [address]  [place] [address]      Primary Telephone Number:  [phone], land  line  Secondary Telephone Number: [phone], sister, Tara Perry's cell  Emergency Contact and Number: Extended Emergency Contact Information  Primary Emergency Contact: Tara Perry  Address: [address]           [place], [place] [address] Tara Perry  Home Phone: [phone]  Mobile Phone: [phone]  Relation: Daughter        Ordering Physician: Dani Gobble, MD    Following Physician: Kennon Holter, MD    PCP: Kennon Holter, MD, [phone]    Agreeable to Follow: Yes  Date/Time of Call: 08/27/2018 @ 1149    Language/Communication Barrier:  No    Primary Diagnosis and Reason for Services:    Cardiac arrest   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 Have you been out of the country in the past 2 weeks? no  2. Do you have any upper respiratory symptoms (cough, SOB, fever)?  no  3. Have you been exposed to anyone with COVID-19 virus? no    Discharge Date: 08/27/2018  Referral Source (PACC/Hospital/Unit): Jason Fila, RN  Referral Date: 08/27/18    Home Health face-to-face (FTF) Encounter (Order [id number])   Consult   Date:  08/27/2018 [facility] Ordering/Authorizing: Dani Gobble, MD   Order Information     Order Date/Time Release Date/Time Start Date/Time End Date/Time   08/27/18 12:56 PM None 08/27/18 12:54 PM 08/27/18 12:54 PM   Order Details     Frequency Duration Priority Order Class   Once 1 occurrence Routine Hospital Performed   Standing Order Information     Remaining Occurrences Interval Last Released     0/1 Once 08/27/2018           Provider Information     Ordering User Ordering Provider Authorizing Provider   Jason Fila, RN Dani Gobble, MD Dani Gobble, MD   Attending Provider(s) Admitting Provider PCP   Tara Coral, MD; Tara Emms, MD; Tara Moris, MD; Tara Lull, MD; Tara Goo, MD PhD; Tara Pancoast, MD; Dani Gobble, MD  Tara Emms, MD Kennon Holter, MD   Verbal Order Info     Action Created on Order Mode Entered by Responsible Provider Signed by Signed on   Ordering 08/27/18 1256 Telephone with Mardene Celeste, RN Dani Gobble, MD     Comments     Draw [facility] on Monday 09/02/2018 via venipuncture with results to Dr. Verlin Perry phone/fax [phone]  [phone] and obtain further orders.    Home Health Aide to assist with ADL's     Home nursing required for skilled assessment including cardiopulmonary assessment and dietary education for disease management, and medication instruction. Home PT/OT required for gait and balance training, strengthening, mobility, fall prevention, and ADL training. Home SLP required for swallowing techniques and aspiration prevention.     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 Functional impairment due to recent hospitalization/procedure/treatment     D. Chronic illness & risk for re-hospitalization due to unstable disease status     F. New diagnosis & treatment requiring follow up monitoring and management     H. Multiple new medications requiring management and monitoring    Clinical findings that support the need for Skilled Nursing. SN will: C. Monitor for signs and symptoms of exacerbation of disease and management     D. Review medication reconciliation, manage and educate on use and side effects     G. Educate on new diagnosis, treatment & management to prevent re-hospitalization     H. Assess cardiopulmonary status and monitor for signs &symptoms of exacerbation     I. Educate dietary and or fluid restrictions and weight management    Clinical findings that support the need for Physical Therapy. PT will A. Evaluate and treat functional impairment and  improve mobility     C. Educate on weight bearing status, stair/gait training, balance & coordination     E. Educate on functional mobility; bed, chair, sit, stand and transfer activities     F. Perform home safety assessment & develop safe in home exercise program     H. Educate on the safe use of assistive device/ durable medical equipment    Clinical findings support the need for OT (needs SN/PT order).OT will A. Develop in home program to improve ability to perform ADLs     C. Educate on recovery and maintenance skills  Clinical findings that support the need for SLP. ST will A. Evaluate & treat speech, language, cognitive, communication & swallowing impair     B. Educate on swallowing deficit, aspiration precautions and dietary recommendatio    Per clinical findings, following services are medically necessary: Skilled Nursing     PT     OT     Speech Language Pathology    Evidence this patient is homebound because: C. Decreased endurance, strength, ROM, cadence, safety/judgment during mobility     G. Fall risk due to impaired coordination, gait and decreased balance     J. Advanced age with frailty factors affecting safe ambulation & needs supervision     M. Unsteady gait, poor ambulation with history of falls     N. Impaired mobility d/t pain, arthritis, weakness that compromises patient safety    Other (please specify) Dr. Verlin Perry to follow in community          Process Instructions     Please select Home Care Services medically necessary.     Based on the above findings, I certify that this patient is confined to the home and needs intermittent skilled nursing care, physical therapry and / or speech therapy or continues to need occupational therapy. The patient is under my care, and I have initiated the establishment of the plan of care. This patient will be followed by a physician who will periodically review the plan of care.    Collection Information     Consult Order Info     ID  Description Priority Start Date Start Time   [id number] Home Health face-to-face (FTF) Encounter Routine 08/27/2018 12:54 PM   Provider Specialty Referred to   ______________________________________ _____________________________________   Acknowledgement Info     For At Acknowledged By Acknowledged On   Placing Order 08/27/18 1256 Jason Fila, RN 08/27/18 1256   Verbal Order Info     Action Created on Order Mode Entered by Responsible Provider Signed by Signed on   Ordering 08/27/18 1256 Telephone with Mardene Celeste, RN Dani Gobble, MD     Patient Information     Patient Name  Mehgan, Santmyer Sex  Female DOB  Apr 27, 1945   Additional Information     Associated Reports External References   Priority and Order Details [employer]

## 2018-08-27 NOTE — Discharge Instr - AVS First Page (Addendum)
Reason for Admission:  Ventricular Fibrillation Cardiac Arrest    Restrictions on Discharge:  No reaching overhead or behind you on the Left side for 1 month.  No heavy lifting (>10lbs) on the Left side for 1 month.  No strenuous physical activity for 1 month.  No soaking activities (baths, hot tubs, swimming pools) for 1 month.  You may shower.  You may walk for physical activity.      Home Health Discharge Information     Your doctor has ordered Skilled Nursing, Physical Therapy, Occupational Therapy and Home Health Aide in-home service(s) for you while you recuperate at home, to assist you in the transition from hospital to home.      The agency that you or your representative chose to provide the service:  Name of Home Health Agency Placement: [facility]]  [phone]    The above services were set up by:  Jason Fila  [facility] Liaison)   Phone  [phone]    IF YOU HAVE NOT HEARD FROM YOUR HOME YOUR HOME HEALTH AGENCY WITHIN 24-48 HOURS AFTER DISCHARGE PLEASE CALL YOUR AGENCY TO ARRANGE A TIME FOR YOUR FIRST VISIT. FOR ANY SCHEDULING CONCERNS OR QUESTIONS RELATED TO HOME HEALTH, SUCH AS TIME OR DATE PLEASE CONTACT YOUR HOME HEALTH AGENCY AT THE NUMBER LISTED ABOVE.

## 2018-08-27 NOTE — OT Progress Note (Signed)
Occupational Therapy Note    [facility]   Occupational Therapy Treatment     [name]    MRN#: [id number]   [facility]  Bed: FI155/FI155-01      Post Acute Care Therapy Recommendations:   Discharge Recommendations: Acute Rehab     Milestones to be reached to achieve recommendation: None    DME Recommended for Discharge: (TBA @ rehab)    If Acute Rehab  recommended discharge disposition is not available, patient will need Mod A for bed mobility, hands on assist for ADLs and OOB transfers, a FWW, shower chair and home health therapies.    Therapy discharge recommendations may change with patient status.  Please refer to most recent note for up-to-date recommendations.    Assessment:   Significant Findings: None    Pt in bed when OT arrived - alert and agreeable to OT session. Pt now s/p Biventricular ICD with ICD precautions. Pt educated on precautions, safe/pain free ROM, and bathing/dressing techniques. Pt required Mod A for supine to sit and CGA for sit to stand and mobility in room with FWW. Pt required setup and increased time for LB dressing. Pt would benefit from continued acute OT services.     Treatment Activities: ADLs, mobility, pt education     Educated the patient to role of occupational therapy, plan of care, goals of therapy and safety with mobility and ADLs, pacemaker precautions.    Plan:    OT Frequency Recommended: 3-4x/wk     Continue plan of care.       Precautions and Contraindications:   Falls  ICD precautions    Updated Medical Status/Imaging/Labs:  X-ray Chest Ap Portable  Result Date: 08/26/2018    No acute findings post ICD placement. Geanie Cooley, MD  08/26/2018 4:25 PM    Subjective: "I can't use my arm."   Patient's medical condition is appropriate for Occupational Therapy intervention at this time.  Patient is agreeable to participation in the therapy session. Nursing clears patient for therapy.    Pain:   Scale: not  rated  Location: L UE  Intervention: positioned for comfort, educated on pain free ROM    Objective:   Patient is in bed with telemetry and peripheral IV in place.      Cognition  Alert  Pleasant, cooperative  Follows directions    Functional Mobility  Rolling:  Min A  Supine to Sit: Mod A  Sit to Stand: CGA  Transfers: CGA with FWW    PMP Activity: Step 6 - Walks in Room    Balance  Static Sitting: good  Dynamic Sitting: fair +  Static Standing: fair  Dynamic Standing: fair -    Self Care and Home Management  Eating: ind hand to mouth  Bathing: reviewed safety and incision care  UE Dressing: reviewed clothing options and techniques   LE Dressing: setup for socks  Toileting: declined     Therapeutic Exercises  Pt educated on pain free gentle ROM of L UE    Participation: good  Endurance: fair +    Patient left with call bell within reach, all needs met, SCDs off as found, fall mat in place, chair alarm activated and all questions answered. RN notified of session outcome and patient response.     Goals:  Time For Goal Achievement: 5 visits  ADL Goals  Patient will groom self: Supervision, at sinkside  Patient will dress upper body: Supervision  Patient  will dress lower body: Supervision  Patient will toilet: Supervision  Mobility and Transfer Goals  Pt will perform functional transfers: Supervision        Executive Fucntion Goals  Pt will demonstrate motor planning: sequencing, problem solving, to increase ability to complete ADLs  Pt will demonstrate safety: instituting safety techniques, using safe hand placement, to increase ability to complete ADLs                Bard Herbert OTR/L  Pager # [phone]    Time of Treatment  OT Received On: 08/27/18  Start Time: 1112  Stop Time: 1135  Time Calculation (min): 23 min    Treatment # 2 of 5 visits

## 2018-08-27 NOTE — Progress Notes (Addendum)
Late entry from 08/26/18:    Telephone call on 08/26/18 to pt's daughter, Festus Aloe [phone]) to discuss [place] planning and recommendations. Attempted to explain PT/OT recommendations of [place] and the reasons for the same. Daughter cut this worker off, stated "no way. Absolutely not, not with COVID going around. No way."    Worker explained alternate recommendation of [place] home w/ HH (PT/PT/possible SN) and daughter was agreeable. She was amenable to speak with [facility] liaison; referral made to [facility] liaison [place].    Frederich Chick, LCSW  Case Management and Discharge Planning  [phone]

## 2018-08-27 NOTE — PT Progress Note (Signed)
[facility]   Physical Therapy Treatment  Patient:  Tara Perry MRN#:  [id number]  [facility]  Bed: FI155/FI155-01      Post Acute Care Therapy Recommendations:   Discharge Recommendations: Home with home health PT(assist with all mobility)     Milestones to be reached to achieve recommendation: increase mobility and transfers  Anticipate achievement in 3 sessions    DME Recommended for Discharge: Front wheel walker(new walker in the room, adjusted)    If Home with home health PT(assist with all mobility) recommended discharge disposition is not available, patient will need rehab.  Therapy discharge recommendations may change with patient status.  Please refer to most recent note for up-to-date recommendations.    Assessment:   Significant Findings: None    Patient received sitting in chair, feeling tired as unable to sleep, but agreeable to participate in therapy. Patient able to ambulate increased distance with CGA with cues for safety. Patient had LOB x 2 required CGA to regain balance. Patient said,she will have whole family in the house mostly to assist with any care on d/c to home.  Patient presents with decreased strength, decreased endurance, decreased balance and decreased functional mobility. Patient would benefit from continued therapy to maximize functional independence with mobility and safety.        Treatment Activities: bed mobility,transfers, gait training, therex, safety, ICD precautions    Educated the patient to role of physical therapy, plan of care, goals of therapy and HEP, safety with mobility and ADLs, pacemaker precautions, discharge instructions, home safety.    Plan:   PT Frequency: 3-4x/wk    Continue plan of care.       Precautions and Contraindications:   Falls   ICD precautions    Updated Medical Status/Imaging/Labs:     X-ray chest AP portable   Impression:     No acute findings post ICD placement.       Subjective:    "I am feeling better  after the walk"  Patient's medical condition is appropriate for Physical Therapy intervention at this time.  Patient is agreeable to participation in the therapy session. Nursing clears patient for therapy.    Pain:   Scale: not rated  Location: ICD site and abdomen(due to CPR)  Intervention: RN made aware and gave pain medicine during session      Objective:   Patient is in chair with IV access,tele in place.    Cognition  Alert and Oriented x 4. Able to follow commands.     Functional Mobility  Rolling: NT  Sit to Supine: CGA  Scooting: CGA  Sit to Stand: CGA with cues for hand placement  Stand to Sit: CGA with cues for hand placement  Transfers: CGA    Ambulation  PMP - Progressive Mobility Protocol   PMP Activity: Step 7 - Walks out of Room  Distance Walked (ft) (Step 6,7): 100 Feet     Level of Assistance required: CGA  Pattern: small steps, decrease cadence, cues for breathing, mild unsteady at times with LOB x 2 requiring CGA  Device Used: RW  Weightbearing Status: FWB BLE  Stair Management: pt said, she has only one step to enter and will be able to use walker and family to assist      Balance  Static Sitting: Good  Dynamic Sitting: Good  Static Standing: Fair+w/RW  Dynamic Standing: Fair+to Fair with RW    Therapeutic Exercises  AP's<LAQ's<hip flexion:10 reps  Patient Participation: Good  Patient Endurance: Good-. Vitals stable throughout the session    Patient left with call bell within reach, all needs met, SCDs off as found, fall mat in place, bed alarm on and all questions answered. RN notified of session outcome and patient response.     Goals:  Goals  Goal Formulation: With patient  Time for Goal Acheivement: 5 visits  Goals: Select goal  Pt Will Go Supine To Sit: with supervision  Pt Will Perform Sit to Stand: with supervision  Pt Will Ambulate: > 200 feet, with rolling walker, with supervision      Synetta Shadow  PT  Pager Number:[phone]    Time of Treatment:  PT Received On: 08/27/18  Start Time:  1315  Stop Time: 1400  Time Calculation (min): 45 min  Treatment # 2 out of 5 visits

## 2018-08-28 ENCOUNTER — Encounter: Payer: Self-pay | Admitting: Cardiovascular Disease

## 2018-08-28 NOTE — Anesthesia Postprocedure Evaluation (Signed)
Anesthesia Post Evaluation    [name]    Procedure(s) with comments:  ICD IMPLANT BIV - medtronic    Anesthesia type: general    Last Vitals:   Vitals Value Taken Time   BP  08/28/2018  1:50 PM   Temp  08/28/2018  1:50 PM   Pulse  08/28/2018  1:50 PM   Resp  08/28/2018  1:50 PM   SpO2  08/28/2018  1:50 PM       Anesthesia Post Evaluation:     Patient Evaluated: PACU    Level of Consciousness: awake and alert    Pain Management: adequate    Airway Patency: patent    Anesthetic complications: No      PONV Status: none    Cardiovascular status: acceptable  Respiratory status: acceptable  Hydration status: acceptable        Anesthesia Qualified Clinical Data Registry 2018    PACU Reintubation  Did the Patient have general anesthesia with intubation: No        PONV Adult  Is the patient aged 74 or older: Yes  Did the patient receive recieve a general anesthestic: Yes  Does the patient have 3 or more risk factors for PONV? No  Did the patient receive anti-emetics from at least two classes of medications? Yes      PONV Pediatric  Is the patient aged 95-17? No            PACU Transfer Checklist Protocol  Was the patient transferred to the PACU at the conclusion of surgery? Yes  Was a checklist or transfer protocol used? Yes    ICU Transfer Checklist Protocol  Was the patient transferred to the ICU at the conclusion of surgery? No      Post-op Pain Assessment Prior to Anesthesia Care End  Age >=18 and assessed for pain in PACU: Yes  Pacu pain score <7/10: Yes      Perioperative Mortality  Perioperative mortality prior to Anesthesia end time: No    Perioperative Cardiac Arrest  Did the patient have an unanticipated intraoperative cardiac arrest between anesthesia start time and anesthesia end time? No    Unplanned Admission to ICU  Did the patient have an unplanned admission to the ICU (not initially anticipated at anesthesia start time)? No      Signed by: Sindy Guadeloupe Kaelon Weekes, 08/28/2018 1:50 PM

## 2018-08-28 NOTE — Progress Notes (Signed)
Transitional Care Management    Case manager contacted patient for initial contact and spoke with patient and her sister. Case manager role, TCM program, and HIPAA information discussed. Patient reported feeling "improved". No active signs/symptoms of congestive heart failure noted. In addition, patient confirmed checking her weight daily and monitoring her fluid/sodium intake. Her sister verbalized that all medications prescribed obtained and patient adhering to medication as prescribed. Patient expressed that she did "not want to be contacted". She explained having home health services weekly and follow up appointment with cardiologist to address any CHF signs/symptoms. Lastly, the sister indicated that she had not heard back from home health and inquired when start of care will be. Case manager provided her contact number and informed to call tomorrow if they have not heard from the agency.    Patient will be discharged from TCM as declined to participate.    Patient's chart updated with the current PCP- Dr. Nicanor Bake. Discharge summary fax to PCP office    Dante Gang, MSW, LCSW, ACM-SW  Social Worker Case Manager II  Transitional Care Management  [facility]  [phone]

## 2018-08-28 NOTE — Progress Notes (Signed)
Transitional Care Management    Case manager contacted patient's PCP office to inform of recent hospitalization. Receptionist indicated that patient is no longer a patient there but is follow up by [facility]. CM to follow up with patient and update PCP name. In addition, [place] home health was contacted to obtain the start of care and was informed that it is still pending but will be scheduled.    Dante Gang, MSW, LCSW, ACM-SW  Social Worker Case Production designer, theatre/television/film II  [facility]  [phone]

## 2018-08-30 ENCOUNTER — Other Ambulatory Visit: Payer: Self-pay | Admitting: Cardiovascular Disease

## 2018-08-30 ENCOUNTER — Ambulatory Visit (INDEPENDENT_AMBULATORY_CARE_PROVIDER_SITE_OTHER): Payer: Self-pay | Admitting: Nurse Practitioner

## 2018-08-30 ENCOUNTER — Ambulatory Visit (INDEPENDENT_AMBULATORY_CARE_PROVIDER_SITE_OTHER): Payer: Self-pay

## 2018-08-30 ENCOUNTER — Inpatient Hospital Stay
Admission: EM | Admit: 2018-08-30 | Discharge: 2018-09-08 | DRG: 314 | Disposition: A | Discharge status: Home Health | Payer: Medicare Other | Attending: Internal Medicine | Admitting: Internal Medicine

## 2018-08-30 DIAGNOSIS — N179 Acute kidney failure, unspecified: Secondary | ICD-10-CM | POA: Diagnosis present

## 2018-08-30 DIAGNOSIS — E1122 Type 2 diabetes mellitus with diabetic chronic kidney disease: Secondary | ICD-10-CM | POA: Diagnosis present

## 2018-08-30 DIAGNOSIS — D62 Acute posthemorrhagic anemia: Secondary | ICD-10-CM | POA: Diagnosis not present

## 2018-08-30 DIAGNOSIS — Z7984 Long term (current) use of oral hypoglycemic drugs: Secondary | ICD-10-CM

## 2018-08-30 DIAGNOSIS — I130 Hypertensive heart and chronic kidney disease with heart failure and stage 1 through stage 4 chronic kidney disease, or unspecified chronic kidney disease: Secondary | ICD-10-CM | POA: Diagnosis present

## 2018-08-30 DIAGNOSIS — I6529 Occlusion and stenosis of unspecified carotid artery: Secondary | ICD-10-CM | POA: Diagnosis present

## 2018-08-30 DIAGNOSIS — I447 Left bundle-branch block, unspecified: Secondary | ICD-10-CM | POA: Diagnosis present

## 2018-08-30 DIAGNOSIS — M109 Gout, unspecified: Secondary | ICD-10-CM | POA: Diagnosis present

## 2018-08-30 DIAGNOSIS — K264 Chronic or unspecified duodenal ulcer with hemorrhage: Secondary | ICD-10-CM | POA: Diagnosis not present

## 2018-08-30 DIAGNOSIS — E785 Hyperlipidemia, unspecified: Secondary | ICD-10-CM | POA: Diagnosis present

## 2018-08-30 DIAGNOSIS — I959 Hypotension, unspecified: Secondary | ICD-10-CM | POA: Diagnosis not present

## 2018-08-30 DIAGNOSIS — Z8674 Personal history of sudden cardiac arrest: Secondary | ICD-10-CM

## 2018-08-30 DIAGNOSIS — I82B12 Acute embolism and thrombosis of left subclavian vein: Secondary | ICD-10-CM | POA: Diagnosis present

## 2018-08-30 DIAGNOSIS — K449 Diaphragmatic hernia without obstruction or gangrene: Secondary | ICD-10-CM | POA: Diagnosis present

## 2018-08-30 DIAGNOSIS — T82867A Thrombosis of cardiac prosthetic devices, implants and grafts, initial encounter: Principal | ICD-10-CM | POA: Diagnosis present

## 2018-08-30 DIAGNOSIS — I428 Other cardiomyopathies: Secondary | ICD-10-CM | POA: Diagnosis present

## 2018-08-30 DIAGNOSIS — N763 Subacute and chronic vulvitis: Secondary | ICD-10-CM

## 2018-08-30 DIAGNOSIS — I5022 Chronic systolic (congestive) heart failure: Secondary | ICD-10-CM | POA: Diagnosis present

## 2018-08-30 DIAGNOSIS — I808 Phlebitis and thrombophlebitis of other sites: Secondary | ICD-10-CM | POA: Diagnosis present

## 2018-08-30 DIAGNOSIS — N952 Postmenopausal atrophic vaginitis: Secondary | ICD-10-CM

## 2018-08-30 DIAGNOSIS — Z95810 Presence of automatic (implantable) cardiac defibrillator: Secondary | ICD-10-CM

## 2018-08-30 DIAGNOSIS — Z79899 Other long term (current) drug therapy: Secondary | ICD-10-CM

## 2018-08-30 DIAGNOSIS — N183 Chronic kidney disease, stage 3 (moderate): Secondary | ICD-10-CM | POA: Diagnosis present

## 2018-08-30 DIAGNOSIS — I82622 Acute embolism and thrombosis of deep veins of left upper extremity: Secondary | ICD-10-CM | POA: Diagnosis present

## 2018-08-30 HISTORY — DX: Chronic kidney disease, stage 3 unspecified: N18.30

## 2018-08-30 HISTORY — DX: Postmenopausal atrophic vaginitis: N95.2

## 2018-08-30 HISTORY — DX: Left bundle-branch block, unspecified: I44.7

## 2018-08-30 HISTORY — DX: Subacute and chronic vulvitis: N76.3

## 2018-08-30 LAB — CBC AND DIFFERENTIAL
Absolute NRBC: 0 10*3/uL (ref 0.00–0.00)
Basophils Absolute Automated: 0.07 10*3/uL (ref 0.00–0.08)
Basophils Automated: 0.3 %
Eosinophils Absolute Automated: 0.18 10*3/uL (ref 0.00–0.44)
Eosinophils Automated: 0.9 %
Hematocrit: 35.5 % (ref 34.7–43.7)
Hgb: 12 g/dL (ref 11.4–14.8)
Immature Granulocytes Absolute: 0.21 10*3/uL — ABNORMAL HIGH (ref 0.00–0.07)
Immature Granulocytes: 1 %
Lymphocytes Absolute Automated: 1.48 10*3/uL (ref 0.42–3.22)
Lymphocytes Automated: 7.3 %
MCH: 30.3 pg (ref 25.1–33.5)
MCHC: 33.8 g/dL (ref 31.5–35.8)
MCV: 89.6 fL (ref 78.0–96.0)
MPV: 10.7 fL (ref 8.9–12.5)
Monocytes Absolute Automated: 1.51 10*3/uL — ABNORMAL HIGH (ref 0.21–0.85)
Monocytes: 7.4 %
Neutrophils Absolute: 16.94 10*3/uL — ABNORMAL HIGH (ref 1.10–6.33)
Neutrophils: 83.1 %
Nucleated RBC: 0 /100 WBC (ref 0.0–0.0)
Platelets: 157 10*3/uL (ref 142–346)
RBC: 3.96 10*6/uL (ref 3.90–5.10)
RDW: 13 % (ref 11–15)
WBC: 20.39 10*3/uL — ABNORMAL HIGH (ref 3.10–9.50)

## 2018-08-30 LAB — BASIC METABOLIC PANEL
Anion Gap: 12 (ref 5.0–15.0)
BUN: 36 mg/dL — ABNORMAL HIGH (ref 7.0–19.0)
CO2: 22 mEq/L (ref 22–29)
Calcium: 10 mg/dL (ref 7.9–10.2)
Chloride: 100 mEq/L (ref 100–111)
Creatinine: 1.7 mg/dL — ABNORMAL HIGH (ref 0.6–1.0)
Glucose: 250 mg/dL — ABNORMAL HIGH (ref 70–100)
Potassium: 4.6 mEq/L (ref 3.5–5.1)
Sodium: 134 mEq/L — ABNORMAL LOW (ref 136–145)

## 2018-08-30 LAB — PT AND APTT
PT INR: 1.1 (ref 0.9–1.1)
PT: 13.6 s (ref 12.6–15.0)
PTT: 20 s — ABNORMAL LOW (ref 23–37)

## 2018-08-30 LAB — I-STAT PROTHROMBIN TIME: i-Stat INR: <0.9

## 2018-08-30 LAB — GLUCOSE WHOLE BLOOD - POCT: Whole Blood Glucose POCT: 224 mg/dL — ABNORMAL HIGH (ref 70–100)

## 2018-08-30 LAB — GFR: EGFR: 29.4

## 2018-08-30 MED ORDER — INSULIN LISPRO 100 UNIT/ML SC SOLN
1.00 [IU] | Freq: Three times a day (TID) | SUBCUTANEOUS | Status: DC
Start: 2018-08-31 — End: 2018-09-05
  Administered 2018-08-31: 08:00:00 2 [IU] via SUBCUTANEOUS
  Administered 2018-08-31: 18:00:00 1 [IU] via SUBCUTANEOUS
  Administered 2018-08-31 – 2018-09-01 (×2): 2 [IU] via SUBCUTANEOUS
  Administered 2018-09-01 (×2): 3 [IU] via SUBCUTANEOUS
  Administered 2018-09-02: 12:00:00 2 [IU] via SUBCUTANEOUS
  Administered 2018-09-03 (×2): 1 [IU] via SUBCUTANEOUS
  Administered 2018-09-04: 12:00:00 3 [IU] via SUBCUTANEOUS
  Administered 2018-09-04: 09:00:00 1 [IU] via SUBCUTANEOUS
  Administered 2018-09-04: 17:00:00 2 [IU] via SUBCUTANEOUS
  Filled 2018-08-30: qty 6
  Filled 2018-08-30 (×3): qty 3
  Filled 2018-08-30: qty 9
  Filled 2018-08-30: qty 6
  Filled 2018-08-30: qty 9
  Filled 2018-08-30 (×2): qty 6

## 2018-08-30 MED ORDER — HEPARIN SODIUM (PORCINE) 5000 UNIT/ML IJ SOLN
40.00 [IU]/kg | INTRAMUSCULAR | Status: DC | PRN
Start: 2018-08-30 — End: 2018-08-30

## 2018-08-30 MED ORDER — VITAMIN B-12 500 MCG PO TABS
1000.0000 ug | ORAL_TABLET | Freq: Every day | ORAL | Status: DC
Start: 2018-08-31 — End: 2018-09-08
  Administered 2018-09-02 – 2018-09-08 (×6): 1000 ug via ORAL
  Filled 2018-08-30 (×8): qty 2

## 2018-08-30 MED ORDER — HEPARIN (PORCINE) IN D5W 50-5 UNIT/ML-% IV SOLN (UNITS/KG/HR ONLY)
18.00 [IU]/kg/h | INTRAVENOUS | Status: DC
Start: 2018-08-30 — End: 2018-08-30
  Filled 2018-08-30: qty 500

## 2018-08-30 MED ORDER — NALOXONE HCL 0.4 MG/ML IJ SOLN (WRAP)
0.20 mg | INTRAMUSCULAR | Status: DC | PRN
Start: 2018-08-30 — End: 2018-09-08

## 2018-08-30 MED ORDER — CARVEDILOL 25 MG PO TABS
25.0000 mg | ORAL_TABLET | Freq: Two times a day (BID) | ORAL | Status: DC
Start: 2018-08-30 — End: 2018-09-01
  Administered 2018-08-30 – 2018-08-31 (×2): 25 mg via ORAL
  Filled 2018-08-30 (×3): qty 1

## 2018-08-30 MED ORDER — DEXTROSE 50 % IV SOLN
12.50 g | INTRAVENOUS | Status: DC | PRN
Start: 2018-08-30 — End: 2018-09-08

## 2018-08-30 MED ORDER — ONDANSETRON HCL 4 MG/2ML IJ SOLN
4.00 mg | Freq: Four times a day (QID) | INTRAMUSCULAR | Status: DC | PRN
Start: 2018-08-30 — End: 2018-09-08

## 2018-08-30 MED ORDER — ENOXAPARIN SODIUM 60 MG/0.6ML SC SOLN
1.00 mg/kg | Freq: Once | SUBCUTANEOUS | Status: AC
Start: 2018-08-30 — End: 2018-08-30
  Administered 2018-08-30: 20:00:00 60 mg via SUBCUTANEOUS
  Filled 2018-08-30: qty 0.6

## 2018-08-30 MED ORDER — SPIRONOLACTONE 25 MG PO TABS
25.0000 mg | ORAL_TABLET | Freq: Every day | ORAL | Status: DC
Start: 2018-08-31 — End: 2018-09-01
  Filled 2018-08-30: qty 1

## 2018-08-30 MED ORDER — GLUCAGON 1 MG IJ SOLR (WRAP)
1.00 mg | INTRAMUSCULAR | Status: DC | PRN
Start: 2018-08-30 — End: 2018-09-08

## 2018-08-30 MED ORDER — ATORVASTATIN CALCIUM 20 MG PO TABS
20.0000 mg | ORAL_TABLET | Freq: Every day | ORAL | Status: DC
Start: 2018-08-31 — End: 2018-09-04
  Administered 2018-08-31 – 2018-09-04 (×4): 20 mg via ORAL
  Filled 2018-08-30 (×4): qty 1

## 2018-08-30 MED ORDER — HEPARIN SODIUM (PORCINE) 5000 UNIT/ML IJ SOLN
80.00 [IU]/kg | Freq: Once | INTRAMUSCULAR | Status: DC
Start: 2018-08-30 — End: 2018-08-30
  Filled 2018-08-30: qty 2

## 2018-08-30 MED ORDER — INSULIN LISPRO 100 UNIT/ML SC SOLN
1.00 [IU] | Freq: Every evening | SUBCUTANEOUS | Status: DC
Start: 2018-08-30 — End: 2018-09-05
  Administered 2018-08-30 – 2018-09-04 (×2): 1 [IU] via SUBCUTANEOUS
  Filled 2018-08-30: qty 9
  Filled 2018-08-30 (×2): qty 3

## 2018-08-30 MED ORDER — GLUCOSE 40 % PO GEL
15.00 g | ORAL | Status: DC | PRN
Start: 2018-08-30 — End: 2018-09-08

## 2018-08-30 MED ORDER — HEPARIN SODIUM (PORCINE) 5000 UNIT/ML IJ SOLN
80.00 [IU]/kg | INTRAMUSCULAR | Status: DC | PRN
Start: 2018-08-30 — End: 2018-08-30

## 2018-08-30 MED ORDER — ACETAMINOPHEN 325 MG PO TABS
650.0000 mg | ORAL_TABLET | Freq: Four times a day (QID) | ORAL | Status: DC | PRN
Start: 2018-08-30 — End: 2018-09-01
  Filled 2018-08-30: qty 2

## 2018-08-30 MED ORDER — ONDANSETRON 4 MG PO TBDP
4.00 mg | ORAL_TABLET | Freq: Four times a day (QID) | ORAL | Status: DC | PRN
Start: 2018-08-30 — End: 2018-09-08

## 2018-08-30 MED ORDER — LACTATED RINGERS IV SOLN
INTRAVENOUS | Status: AC
Start: 2018-08-30 — End: 2018-08-31

## 2018-08-30 NOTE — ED Notes (Signed)
..       NURSING NOTE FOR THE RECEIVING INPATIENT NURSE     ED NURSE [place] [id number]   ED CHARGE NURSE [phone]   ADMISSION INFORMATION   Tara Perry is a 73 y.o. female admitted with a diagnosis of:    1. Arm DVT (deep venous thromboembolism), acute, left       Isolation:  None  Place of residence / Living situation:  Home/Family Care   NURSING CARE   Mental Status alert and oriented   ADL ADLs:            Needs assistance with ADLs  Ambulation:  Ambulates with: walker   Pertinent Information  and Safety Concerns Pt arrived to ED from [facility]ology diagnosed with DVT to LUE. Pt noted swelling/coolness to extremity this AM. Recent cardiac arrest on 6/5 with ICD placement this week. No pulse with doppler. Cap refill less that 3 second. Scattered bruising from recent hospitalization. Pt breathing on room air. Has been using walker at home recently. Denies incontinence. Denies taking blood thinners at home.       VITAL SIGNS     Time of Last Set of Vitals 2019   Temperature 97.7   BP 131/60   Pulse 70   Respirations 17   Pulse OX 97        IV LINES     Peripheral IV 08/30/18 Right Antecubital (Active)   Placement Date/Time: 08/30/18 1833   Size (Gauge): 20 G  Orientation: Right  Location: Antecubital  Site Prep: Chlorhexidine   Inserted by: Jill Alexanders EMT  Ultrasound utilized to locate venous access?: Yes        LAB RESULTS     Labs Reviewed   CBC AND DIFFERENTIAL - Abnormal; Notable for the following components:       Result Value    WBC 20.39 (*)     Neutrophils Absolute 16.94 (*)     Abs Mono Automated 1.51 (*)     Absolute Immature Granulocyte 0.21 (*)     All other components within normal limits   BASIC METABOLIC PANEL - Abnormal; Notable for the following components:    Glucose 250 (*)     BUN 36.0 (*)     Creatinine 1.7 (*)     Sodium 134 (*)     All other components within normal limits   GFR   PT AND APTT   I-STAT PROTHROMBIN TIME        PATIENT BELONGINGS

## 2018-08-30 NOTE — Progress Notes (Addendum)
Discussed case with Dr. Lucina Mellow    Recent hx of cardiac arrest with AICD placement, and now presenting with mild LUE swelling in setting of LUE DVT, likely due to recent lead placement.    Recommend lovenox and re-assessment with ultrasound in 48 hours. If significantly symptomatic, will pursue suction thrombectomy, although typically the need for this is very uncommon in setting of acute thrombus promptly treated with [facility].     Full consult to follow

## 2018-08-30 NOTE — ED Notes (Signed)
Ben (EMT) unsuccessful at obtaining IV or drawing lab work. Pt difficult stick. Justin (EMT) to attempt IV access. Dr. Lucina Mellow updated.

## 2018-08-30 NOTE — ED Notes (Signed)
Triage Note    Initial evaluation to expedite care - not definitive treatment.  73 yo female with recent VF arrest 6/5 - has biventricular AICD placed  Prior to discharge.  Now with left arm swelling - had outpatient doppler that showed subclavian thrombus.       Maurine Minister, MD  08/30/18 1711

## 2018-08-30 NOTE — ED Triage Notes (Signed)
Pt arrives POV from [facility] Radiology, dx w/ DVT to LUE. Hx recent cardiac arrest on 6/5 with ICD placement. Pt reports LUE swelling, 3+ pitting edema noted. LUE colder when compared to RUE, L hand warm. Cap refill 3 sec. Unable to palpate pulse in LUE. Attempt w/ doppler unsuccessful. Scattered bruises noted on exam from recent hospitalization.

## 2018-08-30 NOTE — ED Notes (Addendum)
Pt difficult stick w/ multiple IV sticks and lab draws during recent hospitalization. Additionally, currently unable to utilize LUE for IV/bloodwork due to swelling and dx of DVT. This RN unable to locate vein for IV placement or lab draw. Ben (EMT) bedside to attempt IV access.

## 2018-08-30 NOTE — Nursing Progress Note (Signed)
Skin Assessment: Cosign Note:    Skin WNL EXCEPT for:     HEAD:   LUE: nonpitting edema / swelling  RUE:   TORSO: LU chest surgical incision, abd bruising    BACK:  SACRUM: blanchable redness  BUTTOCKS/INGUINAL/PERINEAL:  LLE: dry heel  RLE: nonpitting edema / swelling / dry and cracked heel  OTHER:  Scattered scratches & bruises

## 2018-08-30 NOTE — ED Triage Notes (Signed)
Pt with +3 edema to left arm, 3 second cap refill; RN unable to palpate radial or ulnar pulses in triage. Arm cooler than opposing side.  ED attending made aware.

## 2018-08-30 NOTE — Pharmacy Admission Med History Basic (Signed)
Admission Medication History - BASIC    Patient's Pharmacy Information:    Primary Pharmacy Updated in Epic: Yes  Preferred pharmacy:   [facility]  [address]  [place] [address]  Phone: [phone] Fax: [phone]    [id number] [place], [place] - [address]  [address]  [facility] [place] [address]  Phone: [phone] Fax: [phone]      Notes: n/a    Primary Source of Medication History: Self      Prior to Admission Medication List:  Home Medications       Med List Status:  In Progress Set By: Lovey Newcomer, RN at 08/30/2018  5:10 PM                  acetaminophen (TYLENOL) 325 MG tablet     Take 2 tablets (650 mg total) by mouth every 6 (six) hours as needed for Pain     atorvastatin (LIPITOR) 10 MG tablet     Take 20 mg by mouth daily        carvedilol (COREG) 25 MG tablet     Take 25 mg by mouth 2 (two) times daily. Morning and nights     metFORMIN (GLUCOPHAGE) 500 MG tablet     Take 1 tablet (500 mg total) by mouth 2 (two) times daily Morning and night     Multiple Vitamins-Minerals (CENTRUM PO)     Take by mouth.       sacubitril-valsartan (Entresto) 49-51 MG Tab per tablet     Take 1 tablet by mouth 2 (two) times daily     sitaGLIPtin (JANUVIA) 100 MG tablet     Take 100 mg by mouth daily. At night      spironolactone (ALDACTONE) 25 MG tablet     Take 1 tablet (25 mg total) by mouth daily     vitamin B-12 (CYANOCOBALAMIN) 100 MCG tablet     Take 1,000 mcg by mouth daily               Summary:    There are no significant changes to the medication list.    Patient demonstrates Good compliance with medications.  Patient demonstrates Good knowledge of medications.     Additional Comments: n/a    Marylyn Ishihara

## 2018-08-30 NOTE — Consults (Signed)
HISTORY AND PHYSICAL EXAM  Surgery  Team 2: Vascular Surgery, [place] [phone]    Date Time: 08/30/18 5:39 PM  Patient Name: Tara Perry  Attending Physician: Gwen Her  Chief Complaint: swollen upper extremity, concern for acute radial artery occlusion    History of Present Illness:   Tara Perry is a 73 y.o. female who presents to the hospital with left arm swelling. She notes gradual onset. Notes no motor or sensory deficits. No swelling of other extremities. Denies new chest pain or shortness of breath.  Patient previously presented to the hospital with V fib arrest on 08/16/18. During that admission, she underwent a left heart catheterization on 6/11 at which time the Right Radial Artery was accessed with ultrasound guidance and then had an ICD pacer placed on the 15th. During the pacer procedure, the L cephalic vein, and left axillary and the left subclavian vein were all accessed. There was no dissection noted and flow noted to be in tact. During the same admission she also had a left radial arterial line placed with multiple attempts to cannulate the artery. There is no upper extremity vascular exam documented following those events for reference/ comparison at this time.    She presented to [facility] radiology for ultrasound and was found to have DVT of her LUE and was sent to the ED for further treatment. Patient has been started on a heparin drip in the ED for DVT.       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    Congestive heart failure     Detached retina, right 08/24/2018    per patient, he only sees peripheral view    Diabetes mellitus 1992    Gout spondylitis     Gout synovitis     Gout synovitis     Type 2 diabetes mellitus, controlled        Past Surgical History:     Past Surgical History:   Procedure Laterality Date    COLONOSCOPY  2002    normal    EXPLORATORY LAPAROTOMY      ICD IMPLANT BIV N/A  08/26/2018    Procedure: ICD IMPLANT BIV;  Surgeon: Len Childs, MD;  Location: [facility];  Service: Cardiovascular;  Laterality: N/A;  medtronic    LHC W/ CORONARY ANGIOS AND LV Left 08/21/2018    Procedure: LHC W/ CORONARY ANGIOS AND LV;  Surgeon: Burna Forts, MD;  Location: [facility]ARDI[facility] C[facility];  Service: Cardiovascular;  Laterality: Left;       Family History:     Family History   Problem Relation Age of Onset    Cancer Father 27        bladder       Social History:     Social History     Socioeconomic History    Marital status: Widowed     Spouse name: Not on file    Number of children: Not on file    Years of education: Not on file    Highest education level: Not on file   Occupational History    Not on file   Social Needs    Financial resource strain: Not on file    Food insecurity     Worry: Not on file     Inability: Not on file    Transportation needs     Medical: Not on  file     Non-medical: Not on file   Tobacco Use    Smoking status: Never Smoker    Smokeless tobacco: Never Used   Substance and Sexual Activity    Alcohol use: Yes     Comment: social    Drug use: No    Sexual activity: Not on file   Lifestyle    Physical activity     Days per week: Not on file     Minutes per session: Not on file    Stress: Not on file   Relationships    Social connections     Talks on phone: Not on file     Gets together: Not on file     Attends religious service: Not on file     Active member of club or organization: Not on file     Attends meetings of clubs or organizations: Not on file     Relationship status: Not on file    Intimate partner violence     Fear of current or ex partner: Not on file     Emotionally abused: Not on file     Physically abused: Not on file     Forced sexual activity: Not on file   Other Topics Concern    Not on file   Social History Narrative    Not on file       Allergies:     Allergies   Allergen Reactions    Shellfish-Derived Products Anaphylaxis        Medications:     Prior to Admission medications    Medication Sig Start Date End Date Taking? Authorizing Provider   acetaminophen (TYLENOL) 325 MG tablet Take 2 tablets (650 mg total) by mouth every 6 (six) hours as needed for Pain 08/27/18   Eugenio Hoes A, PA   atorvastatin (LIPITOR) 10 MG tablet Take 20 mg by mouth daily       [provider]   carvedilol (COREG) 25 MG tablet Take 25 mg by mouth 2 (two) times daily. Morning and nights    [provider]   metFORMIN (GLUCOPHAGE) 500 MG tablet Take 1 tablet (500 mg total) by mouth 2 (two) times daily Morning and night 08/28/18   Clydie Braun, PA   Multiple Vitamins-Minerals (CENTRUM PO) Take by mouth.      [provider]   sacubitril-valsartan Sherryll Burger) 49-51 MG Tab per tablet Take 1 tablet by mouth 2 (two) times daily    [provider]   sitaGLIPtin (JANUVIA) 100 MG tablet Take 100 mg by mouth daily. At night     [provider]   spironolactone (ALDACTONE) 25 MG tablet Take 1 tablet (25 mg total) by mouth daily 08/28/18   Clydie Braun, PA   vitamin B-12 (CYANOCOBALAMIN) 100 MCG tablet Take 1,000 mcg by mouth daily       [provider]       Review of Systems:   As per HPI. A complete 10 point review of systems was performed and all other systems reviewed were negative.      Physical Exam:     Vitals:    08/30/18 1730   BP: 106/54   Pulse: 71   Resp:    Temp:    SpO2: 98%       Body mass index is 23.26 kg/m.    Intake and Output Summary (Last 24 hours) at Date Time  No intake or output data in the  24 hours ending 08/30/18 1739    PHYSICAL EXAM:  General: NAD, alert and oriented  HEENT: No scleral icterus, EOMI  Chest:  Normal respiratory effort, CTAB; L chest incision with dermabond in tact  Heart:  RRR  Abdomen:  Soft, non-distended, non-tender, no rebound or guarding  Extremities:  MAE, no edema  LUE: swollen, minimally cooler to touch compared with RUE, dopplerable ulnar and  palmar arch signals; motor strength 5/5 and sensation in tact in all distributions; multiple vascular access marks over lateral aspect of Left wrist  RUE: palpable radial and ulnar  Lymph: no cervical LAD  Skin: warm, dry, intact, no rashes, bruising or lesions, non jaundiced  Neuro:  Grossly normal, CN intact  Psych: appropriate mood and affect        Labs:   Unremarkable    Rads:   Ct Head Wo Contrast    Result Date: 08/17/2018  No acute intracranial abnormality. Eloise Harman, MD  08/17/2018 12:36 AM    X-ray Chest Ap Portable    Result Date: 08/26/2018    No acute findings post ICD placement. Geanie Cooley, MD  08/26/2018 4:25 PM    Xr Chest Ap Portable    Result Date: 08/19/2018  Lines and tubes as described without pneumothorax. Bibasilar atelectasis without focal consolidation identified. Colonel Bald, MD  08/19/2018 12:35 PM    Xr Chest Ap Portable    Result Date: 08/17/2018   Right IJ central venous catheter has been pulled back with tip now at the cavoatrial junction. Shelly Flatten, MD  08/17/2018 6:29 PM    Xr Chest Ap Portable    Result Date: 08/17/2018   1. Right IJ central venous catheter placement with tip in the right atrium. 2. Retrocardiac opacity may represent atelectasis or effusion. Shelly Flatten, MD  08/17/2018 5:28 PM    Chest Ap Portable    Result Date: 08/17/2018   1. ET tube tip 3.2 cm above the carina and gastric drainage tube sidehole in the stomach and tip below the field-of-view. 2. Central vascular congestion, likely upper lobe edema, and bibasilar atelectasis. Infection/aspiration cannot be entirely excluded. 3. Borderline enlarged cardiopericardial silhouette. Eloise Harman, MD  08/17/2018 12:11 AM        Problem List:     Patient Active Problem List   Diagnosis    Peritonitis    Viral cardiomyopathy    Type II diabetes mellitus without mention of complication    Gout synovitis    Pulmonary edema    Type 2 diabetes mellitus without complications    SIRS (systemic inflammatory response  syndrome)    Viral cardiomyopathy    Dyspnea    CHF (congestive heart failure)    Cardiac arrest       Assessment:   73 y.o. female with LUE swelling secondary to venous clot burden. Absence of radial pulse likely in setting of multiple vascular access attempts during prior admission. There is collateral flow through the ulnar and palmar arch. Lmb is not threatened.    Plan:   -- Continue heparin drip  -- Monitor neurovascular exam  -- anticipate recannulization of radial artery over time  -- limb not threatened at present  -- Discussed with Dr. Gwen Her    Signed by: Alain Honey

## 2018-08-30 NOTE — ED Provider Notes (Signed)
[name] EMERGENCY DEPARTMENT H&P      Visit date: 08/30/2018      CLINICAL SUMMARY          Diagnosis:    .     Final diagnoses:   Acute embolism and thrombosis of deep vein of left upper extremity   Arm DVT (deep venous thromboembolism), acute, left         MDM Notes:        Patient presenting with known DVT in LUE.  Body IR consulted, will see patient.  Recommending lovenox.  Patient with good sensation, normal cap refill, warm extremity.      AKI with creatinine 1.7 - previous creatinine before discharge 1.2         Disposition:         Inpatient Admit      ED Disposition     ED Disposition Condition Date/Time Comment    Admit  Fri Aug 30, 2018  7:54 PM Admitting Physician: Olin Pia [[id number]]   Diagnosis: Arm DVT (deep venous thromboembolism), acute, left [1914782]   Estimated Length of Stay: > or = to 2 midnights   Tentative Discharge Plan?: Home or Self Care [1]   Patient Class: Inpatient [101]           CASE ACUITY SUMMARY                       CLINICAL INFORMATION        HPI:      Chief Complaint: Cold Extremity  .    Tara Perry is a 73 y.o. female who presents with swelling of the left arm.  Patient is s/p V-fib arrest on 08/16/18.  Had BiV ICD placed on the 15th and d/c'd on the 16th.  She noted left arm swelling that began today.  She is still able to feel her fingers, has no weakness of the hand, normal motor function.  There is no associated pain.  She had outpatient [place] that showed occlusive DVT in the left subclavian, axillary, brachial and cephalic veins.  She is not currently on anticoagulation    History obtained from: patient, family          ROS:      Positive and negative ROS elements as per HPI.  All other systems reviewed and negative.      Physical Exam:      Pulse 74   BP 114/58   Resp 18   SpO2 97 %   Temp 97.2 F (36.2 C)    Physical Exam   Constitutional: She is oriented to person, place, and time and well-developed, well-nourished, and in no  distress. No distress.   HENT:   Head: Normocephalic and atraumatic.   Eyes: Pupils are equal, round, and reactive to light. EOM are normal.   Neck: Normal range of motion. Neck supple.   Cardiovascular: Normal rate and regular rhythm.   Pulmonary/Chest: Effort normal. No respiratory distress.   Musculoskeletal:      Comments: Left arm edema  Radial pulse is not palpable.  Normal cap refill.  Temperature equal in b/l hands.     Neurological: She is alert and oriented to person, place, and time.   LUE distal sensation intact to light touch, normal distal motor   Skin:   Left chest wall ICD implantation site clean, dry.  No erythema, no discharge   Nursing note and vitals reviewed.  PAST HISTORY        Primary Care Provider: Tammi Sou, MD        PMH/PSH:    .     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 She received 3 shocks, 1 round of epi with successful ROSC. 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/2018    per patient, he only sees peripheral view    Diabetes mellitus 1992    Gout spondylitis     Gout synovitis     LBBB (left bundle branch block)     LBBB chronic    NICM (nonischemic cardiomyopathy) 08/2018    NICM, EF 20%    Respiratory failure 08/2018    Hypoxic/anoxic respiratory failure, extubated 08/20/2018    Type 2 diabetes mellitus, controlled     Viral cardiomyopathy 2008       She has a past surgical history that includes Colonoscopy (2002); Exploratory laparotomy; LHC w/ Coronary Angios and LV (Left, 08/21/2018); and ICD Implant BiV (N/A, 08/26/2018).      Social/Family History:      She reports that she has never smoked. She has never used smokeless tobacco. She reports current alcohol use. She reports that she does not use drugs.    Family History   Problem Relation Age of Onset     Cancer Father 50        bladder         Listed Medications on Arrival:    .     Home Medications     Med List Status:  In Progress Set By: Lovey Newcomer, RN at 08/30/2018  5:10 PM                acetaminophen (TYLENOL) 325 MG tablet     Take 2 tablets (650 mg total) by mouth every 6 (six) hours as needed for Pain     atorvastatin (LIPITOR) 10 MG tablet     Take 20 mg by mouth daily        carvedilol (COREG) 25 MG tablet     Take 25 mg by mouth 2 (two) times daily. Morning and nights     metFORMIN (GLUCOPHAGE) 500 MG tablet     Take 1 tablet (500 mg total) by mouth 2 (two) times daily Morning and night     Multiple Vitamins-Minerals (CENTRUM PO)     Take by mouth.       sacubitril-valsartan (Entresto) 49-51 MG Tab per tablet     Take 1 tablet by mouth 2 (two) times daily     sitaGLIPtin (JANUVIA) 100 MG tablet     Take 100 mg by mouth daily. At night      spironolactone (ALDACTONE) 25 MG tablet     Take 1 tablet (25 mg total) by mouth daily     vitamin B-12 (CYANOCOBALAMIN) 100 MCG tablet     Take 1,000 mcg by mouth daily            Allergies: She is allergic to shellfish-derived products.            VISIT INFORMATION        Clinical Course in the ED:          Good ulnar arterial flow,  normal cap refill.  Patient had arterial line during recent hospitalization and lack of radial pulse is likely due to thrombosis locally in artery.  Seen by vascular surgery resident who confirms NTD      Medications Given in the ED:    .     ED Medication Orders (From admission, onward)    Start Ordered     Status Ordering Provider    08/30/18 1930 08/30/18 1843  enoxaparin (LOVENOX) syringe 60 mg  Once     Route: Subcutaneous  Ordered Dose: 1 mg/kg     Last MAR action:  Given Thirza Pellicano ANN    08/30/18 1732 08/30/18 1731    Once     Route: Intravenous  Ordered Dose: 80 Units/kg     Discontinued Natassia Guthridge ANN    08/30/18 1732 08/30/18 1731    Continuous     Route: Intravenous  Ordered Dose: 18 Units/kg/hr     Discontinued Mahagony Grieb,  Luana Tatro ANN    08/30/18 1731 08/30/18 1731    As needed     Route: Intravenous  Ordered Dose: 80 Units/kg     Discontinued Starlina Lapre ANN    08/30/18 1731 08/30/18 1731    As needed     Route: Intravenous  Ordered Dose: 40 Units/kg     Discontinued Avyukt Cimo ANN            Procedures:      Procedures      Interpretations:      O2 sat-           saturation: 97 %; Oxygen use: room air; Interpretation: Normal       I reviewed the past medical history, past surgical history, and social history.    Differential Dx (not completely inclusive): DVT, arterial occlusion, do not suspect PE, dehydration, AKI    Laboratory results reviewed by EDP: Yes  Radiologic study results reviewed by EDP: Yes    Old medical records reviewed by me: yes    EKG as interpreted by me: paced at 70bpm                  RESULTS        Lab Results:      Results     Procedure Component Value Units Date/Time    PT/APTT [[id number]] Collected:  08/30/18 2013     Updated:  08/30/18 2013    Basic Metabolic Panel [[id number]]  (Abnormal) Collected:  08/30/18 1849    Specimen:  Blood Updated:  08/30/18 1944     Glucose 250 mg/dL      BUN 35.5 mg/dL      Creatinine 1.7 mg/dL      Calcium 73.2 mg/dL      Sodium 202 mEq/L      Potassium 4.6 mEq/L      Chloride 100 mEq/L      CO2 22 mEq/L      Anion Gap 12.0    GFR [[id number]] Collected:  08/30/18 1849     Updated:  08/30/18 1944     EGFR 29.4    CBC and differential [[id number]]  (Abnormal) Collected:  08/30/18 1849    Specimen:  Blood Updated:  08/30/18 1924     WBC 20.39 x10 3/uL      Hgb 12.0 g/dL      Hematocrit 16.0 %      Platelets 157 x10 3/uL      RBC 3.96 x10 6/uL      MCV  89.6 fL      MCH 30.3 pg      MCHC 33.8 g/dL      RDW 13 %      MPV 10.7 fL      Neutrophils 83.1 %      Lymphocytes Automated 7.3 %      Monocytes 7.4 %      Eosinophils Automated 0.9 %      Basophils Automated 0.3 %      Immature Granulocyte 1.0 %      Nucleated RBC 0.0 /100 WBC      Neutrophils Absolute 16.94 x10 3/uL      Abs  Lymph Automated 1.48 x10 3/uL      Abs Mono Automated 1.51 x10 3/uL      Abs Eos Automated 0.18 x10 3/uL      Absolute Baso Automated 0.07 x10 3/uL      Absolute Immature Granulocyte 0.21 x10 3/uL      Absolute NRBC 0.00 x10 3/uL     i-Stat Prothrombin time [[id number]] Collected:  08/30/18 1849     Updated:  08/30/18 1854     i-Stat Prothrombin time <> sec      i-Stat INR <0.9              Radiology Results:      No orders to display               Scribe Attestation:      No scribe involved in the care of this patient            Harmon Dun, MD  08/31/18 0116

## 2018-08-30 NOTE — ED Notes (Signed)
Admit MD at bedside

## 2018-08-30 NOTE — H&P (Signed)
MEDICINE HISTORY AND PHYSICAL EXAM    Date Time: 08/30/18 8:57 PM  Patient Name: Tara Perry  Attending Physician: Harmon Dun, MD  Attending Physician: Tammi Sou, MD    CC: Arm DVT (deep venous thromboembolism), acute, left    Checklist/signout:   Code status and current acuity of illness (sick/priority): Full Code/   Medication Reconciliation Reviewed and Method: yes, with patient and son by me  FYI/high risk meds/contingency/anticipatory guidance: see red font in plan    Consultants/status/reason:   IR  Vascular    Assessment:72 y.o. female with hx recent VF arrest, HFrEF, here with acute LUE DVT and AKI   Principal Problem:    Arm DVT (deep venous thromboembolism), acute, left  Resolved Problems:    * No resolved hospital problems. *    #LUE DVT (acute) likely related to ICD placement  #Acute kidney injury likely hypovolemic  #Absence of L radial pulse likely 2/2 prior cannulation attempts for various procedures, including previous arterial line.  #Difficult stick  #Recent VF arrest 6/5 s/p BiV AICD placement  #Chronic HFrEF (21% in setting of cardiac arrest)  #DM type II controlled A1c 6.5  #Not quite thrombocytopenic yet but platlets are lower than recent baseline    Plan:    No surgical needs per vascular, anticipating eventual recannulization of radial artery over time.  Limb is not threatened   Therapeutic lovenox given once.  However based on CrCl will need heparin drip to be started 12 hours after. Will hydrate and eval renal function with AM labs and decide if we can use lovenox therapeutic dosing if CrCl>30.  Further anticoagulants will need to be ordered by 8 AM, 12 hours from the Lovenox injection.  I will sign out to the nocturnal NP's to follow the labs.   If platelets drop further consider HIT as a possibility given VTE.   GDMT: Coreg, Entresto, Aldactone: Her current blood pressure indicates the ability to tolerate Coreg which I will order for tonight.  I have ordered the  Aldactone to be given tomorrow given a mild diuretic potential in the setting of hypovolemia.  I have held off on reordering the [place] and need to be done as her kidney functions improves    DVT ppx: N/A-on therapeutic anticoagulation  Foley: Not present  IVs: Peripheral IV  PTOT: Not needed  Labs ordered: Chem x 3 days, CBC x 1, Coags x 1    Disposition:Today's date: 08/30/2018   Admit Date: 08/30/2018  5:23 PM   Service status: Inpatient: risk of morbidity and mortality and risk of readmission  Reason for ongoing hospitalization: AKI and DVT  Anticipated discharge needs: TBD.    HPI: CC:  Arm DVT (deep venous thromboembolism), acute, left   Tara Perry is a 73 y.o. female with recent hospitalization for cardiac arrest and resultant cardiomyopathy presents to the emergency room with acute episode of left arm swelling and discoloration this morning.  The patient and the son noticed that around 8:00 in the morning and proceeded to contact the cardiology office for advice.  They were advised to get the ultrasound done and was subsequently diagnosed with left upper extremity DVT.  The patient is in no pain and has been recovering well at home and the incision has been clean, dry, with no drainage or bleeding.  The son and the patient does not endorse any change of temperature in the arms although 1 emergency room team member did document a difference of temperatures earlier.  The patient and her son did note a dark in coloration on the left arm compared to the right.  Other than that the patient reports regular bowel movements.  She still has some soreness in the upper abdominal area presumably secondary to resuscitation efforts during her cardiac arrest.  The patient reports normal urination and thought that the color was also normal.  When asked further regarding her intake, they did admit that perhaps they have not been taking in as much as they are to in terms of fluid and food.    For summary of the patient's  hospital course, please refer to discharge summary dated August 27, 2018.  The patient was discharged 3 days ago.  They have noted no changes in medications since discharge.  The blood pressure has been largely normal except for this morning during the process of getting advice with the cardiologist office, she did have one episode of blood pressure with systolic in the 70s was some dizziness but subsequently has recovered and has not had hypotension prior to today.    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 She received 3 shocks, 1 round of epi with successful ROSC. 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/2018    per patient, he only sees peripheral view    Diabetes mellitus 1992    Gout spondylitis     Gout synovitis     LBBB (left bundle branch block)     LBBB chronic    NICM (nonischemic cardiomyopathy) 08/2018    NICM, EF 20%    Respiratory failure 08/2018    Hypoxic/anoxic respiratory failure, extubated 08/20/2018    Type 2 diabetes mellitus, controlled     Viral cardiomyopathy 2008     Past Surgical History:   Procedure Laterality Date    COLONOSCOPY  2002    normal    EXPLORATORY LAPAROTOMY      ICD IMPLANT BIV N/A 08/26/2018    Procedure: ICD IMPLANT BIV;  Surgeon: Len Childs, MD;  Location: [facility];  Service: Cardiovascular;  Laterality: N/A;  medtronic    LHC W/ CORONARY ANGIOS AND LV Left 08/21/2018    Procedure: LHC W/ CORONARY ANGIOS AND LV;  Surgeon: Burna Forts, MD;  Location: [facility];  Service: Cardiovascular;  Laterality: Left;     Available old records reviewed, including: Discharge summary from the last hospitalization    Social and Family History:   She reports  that she has never smoked. She has never used smokeless tobacco. She reports current alcohol use. She reports that she does not use drugs.    family history includes Cancer (age of onset: 26) in her father.  Allergies and Home Medications:   She is allergic to shellfish-derived products.    Home Medications     Med List Status:  In Progress Set By: Lovey Newcomer, RN at 08/30/2018  5:10 PM                acetaminophen (TYLENOL) 325 MG tablet     Take 2 tablets (650 mg total) by mouth every 6 (six) hours  as needed for Pain     atorvastatin (LIPITOR) 10 MG tablet     Take 20 mg by mouth daily        carvedilol (COREG) 25 MG tablet     Take 25 mg by mouth 2 (two) times daily. Morning and nights     metFORMIN (GLUCOPHAGE) 500 MG tablet     Take 1 tablet (500 mg total) by mouth 2 (two) times daily Morning and night     Multiple Vitamins-Minerals (CENTRUM PO)     Take by mouth.       sacubitril-valsartan (Entresto) 49-51 MG Tab per tablet     Take 1 tablet by mouth 2 (two) times daily     sitaGLIPtin (JANUVIA) 100 MG tablet     Take 100 mg by mouth daily. At night      spironolactone (ALDACTONE) 25 MG tablet     Take 1 tablet (25 mg total) by mouth daily     vitamin B-12 (CYANOCOBALAMIN) 100 MCG tablet     Take 1,000 mcg by mouth daily            Objective: Medications were reviewed:   BP 131/60    Pulse 70    Temp 97.7 F (36.5 C) (Oral)    Resp 17    Ht 1.651 m (5\' 5" )    Wt 63.4 kg (139 lb 12.4 oz)    SpO2 97%    BMI 23.26 kg/m  Body mass index is 23.26 kg/m.  General: awake, alert, oriented x 3; no acute distress.  Appears fatigued  HEENT: sclera anicteric  oropharynx clear without lesions, mucous membranes somewhat dry  Neck: supple, no JVD  Cardiovascular: regular rate and rhythm, no murmurs, rubs or gallops  Lungs: Mild crackles in bilateral lung bases right greater than left, otherwise clear to auscultation bilaterally, without wheezing, rhonchi.  ICD site clean dry and intact with no drainage and bleeding or  surrounding erythema  Abdomen: soft, non-tender, non-distended; no palpable masses, no hepatosplenomegaly, normoactive bowel sounds, no rebound or guarding  Extremities: no clubbing, cyanosis.  Cool to touch but equal bilaterally in the upper and lower extremities.  Left upper extremity with edema all the way up the arm and with darkened, reddened coloration compared to the left  Neuro: moving all 4 extremities  Skin: no rashes or lesions noted    LABS: reviewed:    Recent Labs   Lab 08/30/18  1849 08/27/18  0522   WBC 20.39* 15.23*   Hgb 12.0 10.5*   Hematocrit 35.5 31.8*   Platelets 157 263   MCV 89.6 91.1     Recent Labs   Lab 08/30/18  2013   PT 13.6   PT INR 1.1      Recent Labs   Lab 08/30/18  1849 08/27/18  0522 08/26/18  0528   Sodium 134* 139 141   Potassium 4.6 4.2 3.7   Chloride 100 110 109   CO2 22 17* 19*   BUN 36.0* 36.0* 36.0*   Creatinine 1.7* 1.2* 1.2*   Calcium 10.0 8.1 8.6   Magnesium  --  1.9 1.9   Glucose 250* 246* 225*        IMAGING: personally reviewed by me, including...   [facility] RUE U/S:   ULTRASOUND LEFT ARM VENOUS DOPPLER, DVT PROTOCOL           CLINICAL HISTORY:  Arm swelling. Post left chest wall ICD implant.  The left upper extremity venous system was evaluated using high resolution   gray scale imaging, color Doppler, and spectral waveform analysis.           Acute occlusive deep vein thrombosis is present in the left subclavian,   axillary, brachial, basilic, and cephalic veins. The imaged portion of the   innominate vein appears patent, the internal jugular is patent. Results   were called to Dierdre Searles at Dr. Fredda Hammed office following the examination.           The contralateral internal jugular vein and subclavian vein are normally   patent.           IMPRESSION:   Occlusive deep vein thrombosis left upper extremity, as above.           Electronically signed by: Carmina Miller M.D.  [Interpreted at: [facility]]   [facility]     EKG - Atrial sense, V  paced rhythm.       This note was generated by the Epic EMR system/ Dragon speech recognition and may contain inherent errors or omissions not intended by the user. Grammatical errors, random word insertions, deletions, pronoun errors and incomplete sentences are occasional consequences of this technology due to software limitations. Not all errors are caught or corrected. If there are questions or concerns about the content of this note or information contained within the body of this dictation they should be addressed directly with the author for clarification    Signed by: Jaci Standard, MD  CC: Tammi Sou, MD

## 2018-08-31 DIAGNOSIS — I469 Cardiac arrest, cause unspecified: Secondary | ICD-10-CM

## 2018-08-31 DIAGNOSIS — I82622 Acute embolism and thrombosis of deep veins of left upper extremity: Secondary | ICD-10-CM

## 2018-08-31 DIAGNOSIS — E119 Type 2 diabetes mellitus without complications: Secondary | ICD-10-CM

## 2018-08-31 DIAGNOSIS — I5042 Chronic combined systolic (congestive) and diastolic (congestive) heart failure: Secondary | ICD-10-CM

## 2018-08-31 LAB — ECG 12-LEAD
Atrial Rate: 71 {beats}/min
P Axis: 61 degrees
P-R Interval: 126 ms
Q-T Interval: 464 ms
QRS Duration: 140 ms
QTC Calculation (Bezet): 504 ms
R Axis: -31 degrees
T Axis: 249 degrees
Ventricular Rate: 71 {beats}/min

## 2018-08-31 LAB — CBC
Absolute NRBC: 0 10*3/uL (ref 0.00–0.00)
Hematocrit: 31.4 % — ABNORMAL LOW (ref 34.7–43.7)
Hgb: 10.5 g/dL — ABNORMAL LOW (ref 11.4–14.8)
MCH: 30 pg (ref 25.1–33.5)
MCHC: 33.4 g/dL (ref 31.5–35.8)
MCV: 89.7 fL (ref 78.0–96.0)
MPV: 11 fL (ref 8.9–12.5)
Nucleated RBC: 0 /100 WBC (ref 0.0–0.0)
Platelets: 213 10*3/uL (ref 142–346)
RBC: 3.5 10*6/uL — ABNORMAL LOW (ref 3.90–5.10)
RDW: 13 % (ref 11–15)
WBC: 20.38 10*3/uL — ABNORMAL HIGH (ref 3.10–9.50)

## 2018-08-31 LAB — BASIC METABOLIC PANEL
Anion Gap: 10 (ref 5.0–15.0)
Anion Gap: 11 (ref 5.0–15.0)
BUN: 37 mg/dL — ABNORMAL HIGH (ref 7.0–19.0)
BUN: 37 mg/dL — ABNORMAL HIGH (ref 7.0–19.0)
CO2: 22 mEq/L (ref 22–29)
CO2: 21 mEq/L — ABNORMAL LOW (ref 22–29)
Calcium: 9.5 mg/dL (ref 7.9–10.2)
Calcium: 9.1 mg/dL (ref 7.9–10.2)
Chloride: 100 mEq/L (ref 100–111)
Chloride: 103 mEq/L (ref 100–111)
Creatinine: 1.4 mg/dL — ABNORMAL HIGH (ref 0.6–1.0)
Creatinine: 1.5 mg/dL — ABNORMAL HIGH (ref 0.6–1.0)
Glucose: 210 mg/dL — ABNORMAL HIGH (ref 70–100)
Glucose: 218 mg/dL — ABNORMAL HIGH (ref 70–100)
Potassium: 4.2 mEq/L (ref 3.5–5.1)
Potassium: 4.4 mEq/L (ref 3.5–5.1)
Sodium: 132 mEq/L — ABNORMAL LOW (ref 136–145)
Sodium: 135 mEq/L — ABNORMAL LOW (ref 136–145)

## 2018-08-31 LAB — PT AND APTT
PT INR: 1.2 — ABNORMAL HIGH (ref 0.9–1.1)
PT: 15.4 s — ABNORMAL HIGH (ref 12.6–15.0)
PTT: 36 s (ref 23–37)

## 2018-08-31 LAB — GFR
EGFR: 36.8
EGFR: 33.9

## 2018-08-31 LAB — GLUCOSE WHOLE BLOOD - POCT
Whole Blood Glucose POCT: 213 mg/dL — ABNORMAL HIGH (ref 70–100)
Whole Blood Glucose POCT: 232 mg/dL — ABNORMAL HIGH (ref 70–100)
Whole Blood Glucose POCT: 198 mg/dL — ABNORMAL HIGH (ref 70–100)
Whole Blood Glucose POCT: 166 mg/dL — ABNORMAL HIGH (ref 70–100)

## 2018-08-31 MED ORDER — CALCIUM CARBONATE ANTACID 500 MG PO CHEW
500.00 mg | CHEWABLE_TABLET | Freq: Four times a day (QID) | ORAL | Status: DC | PRN
Start: 2018-08-31 — End: 2018-09-08
  Administered 2018-08-31 – 2018-09-01 (×2): 500 mg via ORAL
  Filled 2018-08-31 (×2): qty 1

## 2018-08-31 MED ORDER — ACETAMINOPHEN 160 MG/5ML PO SOLN
325.00 mg | Freq: Four times a day (QID) | ORAL | Status: DC | PRN
Start: 2018-08-31 — End: 2018-09-01
  Administered 2018-08-31 – 2018-09-01 (×2): 325 mg via ORAL
  Filled 2018-08-31 (×4): qty 10.15

## 2018-08-31 MED ORDER — ENOXAPARIN SODIUM 60 MG/0.6ML SC SOLN
1.00 mg/kg | Freq: Two times a day (BID) | SUBCUTANEOUS | Status: DC
Start: 2018-08-31 — End: 2018-08-31

## 2018-08-31 MED ORDER — ENOXAPARIN SODIUM 60 MG/0.6ML SC SOLN
1.00 mg/kg | Freq: Two times a day (BID) | SUBCUTANEOUS | Status: DC
Start: 2018-08-31 — End: 2018-09-01
  Administered 2018-08-31 (×2): 60 mg via SUBCUTANEOUS
  Filled 2018-08-31 (×2): qty 0.6

## 2018-08-31 NOTE — UM Notes (Signed)
08/30/18 1954  Admit to Inpatient (ADULT INPATIENT ADMIT PANEL         73 y.o. female with recent hospitalization for cardiac arrest and resultant cardiomyopathy presents to the emergency room with acute episode of left arm swelling and discoloration this morning.  The patient and the son noticed that around 8:00 in the morning and proceeded to contact the cardiology office for advice.  They were advised to get the ultrasound done and was subsequently diagnosed with left upper extremity DVT.  The patient is in no pain and has been recovering well at home and the incision has been clean, dry, with no drainage or bleeding.  The son and the patient does not endorse any change of temperature in the arms although 1 emergency room team member did document a difference of temperatures earlier.  The patient and her son did note a dark in coloration on the left arm compared to the right.  Other than that the patient reports regular bowel movements.  She still has some soreness in the upper abdominal area presumably secondary to resuscitation efforts during her cardiac arrest.  The patient reports normal urination and thought that the color was also normal.  When asked further regarding her intake, they did admit that perhaps they have not been taking in as much as they are to in terms of fluid and food.    Patient admitted to CTUS-No surgical needs per vascular, anticipating eventual recannulization of radial artery over time.  Limb is not threatened.  Patient received therapeutic lovenox x1,  however based on CrCl will need heparin drip to be started 12 hours after.   Plan for recheck [place] tomorrow.    V/S T-98.4, R-16, o2sat-94 on room air, HR-68 (NSR), BP-111/61    Labs WBC-10.39, Glucose-250,BUN-36, cr-1.7, Na-134, PTT-20       Exam  Neck: supple, no JVD  Cardiovascular: regular rate and rhythm, no murmurs, rubs or gallops  Lungs: Mild crackles in bilateral lung bases right greater than left, otherwise clear to  auscultation bilaterally, without wheezing, rhonchi.  ICD site clean dry and intact with no drainage and bleeding or surrounding erythema  Abdomen: soft, non-tender, non-distended; no palpable masses, no hepatosplenomegaly, normoactive bowel sounds, no rebound or guarding  Extremities: no clubbing, cyanosis.  Cool to touch but equal bilaterally in the upper and lower extremities.  Left upper extremity with edema all the way up the arm and with darkened, reddened coloration compared to the left    Plan  Further anticoagulants will need to be ordered by 8 AM, 12 hours from the Lovenox injection.   If platelets drop further consider HIT as a possibility given VTE.  GDMT: Coreg, Entresto, Aldactone: Her current blood pressure indicates the ability to tolerate Coreg which I will order for tonight.  I have ordered the Aldactone to be given tomorrow given a mild diuretic potential in the setting of hypovolemia.  I have held off on reordering the [place] and need to be done as her kidney functions improves    Rosita Kea, BSN  Utilization Review Nurse  [phone]

## 2018-08-31 NOTE — Plan of Care (Signed)
Cross cover    AM labs reviewed, orders placed for lovenox per Dr. Baker Janus

## 2018-08-31 NOTE — Plan of Care (Signed)
Problem: Safety  Goal: Patient will be free from injury during hospitalization  Outcome: Progressing  Flowsheets (Taken 08/31/2018 1102)  Patient will be free from injury during hospitalization:   Assess patient's risk for falls and implement fall prevention plan of care per policy   Use appropriate transfer methods   Ensure appropriate safety devices are available at the bedside   Provide and maintain safe environment   Include patient/ family/ care giver in decisions related to safety     Problem: Pain  Goal: Pain at adequate level as identified by patient  Outcome: Progressing  Flowsheets (Taken 08/31/2018 1102)  Pain at adequate level as identified by patient:   Identify patient comfort function goal   Assess for risk of opioid induced respiratory depression, including snoring/sleep apnea. Alert healthcare team of risk factors identified.   Assess pain on admission, during daily assessment and/or before any "as needed" intervention(s)     AOx4,VSS on RA. Infusing RR till 1600. Safety precaution maintained. Speech evaluation done.  Pt will have [place] on left arm tomorrow morning.

## 2018-08-31 NOTE — SLP Eval Note (Signed)
[facility]   Speech and Language Therapy Bedside Swallow Evaluation     [name]    MRN#: [id number]     Consult received for Tara Perry for SLP Bedside Swallow Evaluation and Treatment.    Plan/Recommendations:   Diet Solids Recommendation: Continue with current diet, regular  Liquid Recommendations:  thin consistency  Recommended Form of Meds: (whole with puree vs crushed PRN)    Precautions/Compensations: Awake/alert;Upright 90 degrees for all oral intake;Small bites/sips;Eat/feed slowly; avoid straws    Recommendation Discussed With: : Patient;Nurse     Plan: begin/continue oral diet, no SLP f/u necessary    Discharge recommendations: No follow up required, Defer to PT/OT recommendation    Assessment:   Pt seen for swallow eval. She reports tolerating regular diet at home and for breakfast today with implementation of precautions. Voice is weak-reduced intensity. She demonstrates slow rate of intake, taking small bites/sips, with functional mastication and swallow response to palpation, no overt s/s aspiration.  REC continue regular diet, thin liquids; general aspiration precautions. Please reconsult PRN.    History of Present Illness:   Tara Perry is a 73 y.o. female admitted on 08/30/2018 with "recent cardiac arrest with AICD placement, and now presenting with mild LUE swelling in setting of LUE DVT, likely due to recent lead placement."    Medical Diagnosis: Arm DVT (deep venous thromboembolism), acute, left [I82.622]    Therapy Diagnosis: oropharyngeal dysphagia unspecified    Past Medical/Surgical History:  Past Medical History:   Diagnosis Date    Atrophic vaginitis 08/30/2018    Cardiac arrest 08/17/2018    VF arrest. She received 3 shocks, 1 round of epi with successful ROSC. 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/2018    per patient, he only sees  peripheral view    Diabetes mellitus 1992    Gout spondylitis     Gout synovitis     LBBB (left bundle branch block)     LBBB chronic    NICM (nonischemic cardiomyopathy) 08/2018    NICM, EF 20%    Respiratory failure 08/2018    Hypoxic/anoxic respiratory failure, extubated 08/20/2018    Type 2 diabetes mellitus, controlled     Viral cardiomyopathy 2008      Past Surgical History:   Procedure Laterality Date    COLONOSCOPY  2002    normal    EXPLORATORY LAPAROTOMY      ICD IMPLANT BIV N/A 08/26/2018    Procedure: ICD IMPLANT BIV;  Surgeon: Len Childs, MD;  Location: [facility];  Service: Cardiovascular;  Laterality: N/A;  medtronic    LHC W/ CORONARY ANGIOS AND LV Left 08/21/2018    Procedure: LHC W/ CORONARY ANGIOS AND LV;  Surgeon: Burna Forts, MD;  Location: [facility];  Service: Cardiovascular;  Laterality: Left;         History/Current Status:  Respiratory Status: room air  Behavior/Mental Status: Awake/alert;Able to follow directions;Cooperative  Nutrition: oral  Diet Prior to Study: regular;thin liquids    Speech Therapy History  Last seen 6/16 at [facility] prior to d/c with recs for mech soft thin liquids, no straws  Pt with recall of precautions    Subjective:   Patient is agreeable to participation in the therapy session. Nursing clears patient for therapy. Patients medical condition is appropriate  for Speech therapy intervention at this time.    PAIN: denies    Objective:   Observation of Patient/Vital Signs:  Patient is in bed with dressings, telemetry and SCD's in place.  Interpreter services required: N/A    Oral Assessment:  Oral Assessment  Oral Comfort: Comfortable  Tongue: Pink and moist  Saliva/Dry Mouth: WNL  Dentition: Adequate     Oral Motor Skills:  Oral Motor Skills  Oral Motor Skills: within functional limits(low intensity voice)    Deglutition Skills:  Deglutition Skills  Position: upright 90 degrees  Food(s) Tested: thin liquid via cup and straw; soft solid;  solid  Oral Stage: chewing reduced, slow but effective  Pharyngeal Stage: adequate, takes single bites/sips, fair laryngeal rise, no overt s/s aspiration  Pt with recall and demonstration of aspiration precautions/strategies    Patient left with call bell within reach, all needs met, SCDs in place, fall mat in place, bed alarm activated and all questions answered. RN notified of session outcome and patient response.     Adonis Brook MS, CCC-SLP    Time of Treatment:  SLP Received On: 08/31/18  Start Time: 1108  Stop Time: 1125  Time Calculation (min): 17 min

## 2018-08-31 NOTE — Nursing Progress Note (Addendum)
Admission Assessment:      Admitted from:   Report received from spectra #    Orientation: AOx 4, soft spoken d/t recent intubation  VSS/other:  Rhythm on tele: NSR  Ambulation: Standby, generalized weakness  Lines/Drips: 20G R-AC; running lactated ringers @ 15mL/hr  GI/GU: cont x2  Code Status: FULL  Fall Score: Moderate     Verified patient ID/arm bands. Ensured appropriate safety precautions in place including: assigned fall score interventions; review of known allergies, and special needs; personal items within reach; call bell within reach.     Critical Labs/Images/Procedures:     Comments:    - Further anticoagulants will need to be ordered by 8 AM, 12 hours from the Lovenox injection.   - see doc flow for complete assessment and vitals.     Plan:   - I&Os / DW  - Speech eval; recent intubation, difficulty swallowing water  - Lovenox & reassessment with U/S in 48hrs          Skin WNL EXCEPT for:     HEAD:   LUE: nonpitting edema / swelling  RUE:   TORSO: LU chest surgical incision, abd bruising    BACK:  SACRUM: blanchable redness  BUTTOCKS/INGUINAL/PERINEAL:  LLE: dry heel  RLE: nonpitting edema / swelling / dry and cracked heel  OTHER:  Scattered scratches & bruises

## 2018-08-31 NOTE — Consults (Signed)
Interventional Radiology  CONSULT HISTORY AND PHYSICAL EXAM    Date Time: 08/31/18 11:49 AM  Patient Name: Tara Perry  Attending Physician: Jetta Lout, MD    History of Presenting Illness:   Tara Perry is a 73 y.o. female with recent cardiac arrest with AICD placement, and now presenting with mild LUE swelling in setting of LUE DVT, likely due to recent lead placement.    She states her left arm swelling is less today. Currently on Lovenox.    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 She received 3 shocks, 1 round of epi with successful ROSC. 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/2018    per patient, he only sees peripheral view    Diabetes mellitus 1992    Gout spondylitis     Gout synovitis     LBBB (left bundle branch block)     LBBB chronic    NICM (nonischemic cardiomyopathy) 08/2018    NICM, EF 20%    Respiratory failure 08/2018    Hypoxic/anoxic respiratory failure, extubated 08/20/2018    Type 2 diabetes mellitus, controlled     Viral cardiomyopathy 2008       Past Surgical History:     Past Surgical History:   Procedure Laterality Date    COLONOSCOPY  2002    normal    EXPLORATORY LAPAROTOMY      ICD IMPLANT BIV N/A 08/26/2018    Procedure: ICD IMPLANT BIV;  Surgeon: Len Childs, MD;  Location: [facility];  Service: Cardiovascular;  Laterality: N/A;  medtronic    LHC W/ CORONARY ANGIOS AND LV Left 08/21/2018    Procedure: LHC W/ CORONARY ANGIOS AND LV;  Surgeon: Burna Forts, MD;  Location: [facility];  Service: Cardiovascular;  Laterality: Left;       Family History:     Family History   Problem Relation Age of Onset    Cancer Father 76        bladder       Social History:     Social History     Socioeconomic History    Marital status:  Widowed     Spouse name: Not on file    Number of children: Not on file    Years of education: Not on file    Highest education level: Not on file   Occupational History    Not on file   Social Needs    Financial resource strain: Not on file    Food insecurity     Worry: Not on file     Inability: Not on file    Transportation needs     Medical: Not on file     Non-medical: Not on file   Tobacco Use    Smoking status: Never Smoker    Smokeless tobacco: Never Used   Substance and Sexual Activity    Alcohol use: Yes     Comment: social    Drug use: No    Sexual activity: Not on file   Lifestyle    Physical activity     Days per week: Not on file     Minutes per session: Not on file  Stress: Not on file   Relationships    Social connections     Talks on phone: Not on file     Gets together: Not on file     Attends religious service: Not on file     Active member of club or organization: Not on file     Attends meetings of clubs or organizations: Not on file     Relationship status: Not on file    Intimate partner violence     Fear of current or ex partner: Not on file     Emotionally abused: Not on file     Physically abused: Not on file     Forced sexual activity: Not on file   Other Topics Concern    Not on file   Social History Narrative    Not on file       Allergies:     Allergies   Allergen Reactions    Shellfish-Derived Products Anaphylaxis       Medications:     Medications Prior to Admission   Medication Sig    acetaminophen (TYLENOL) 325 MG tablet Take 2 tablets (650 mg total) by mouth every 6 (six) hours as needed for Pain    atorvastatin (LIPITOR) 10 MG tablet Take 20 mg by mouth daily       carvedilol (COREG) 25 MG tablet Take 25 mg by mouth 2 (two) times daily. Morning and nights    metFORMIN (GLUCOPHAGE) 500 MG tablet Take 1 tablet (500 mg total) by mouth 2 (two) times daily Morning and night    Multiple Vitamins-Minerals (CENTRUM PO) Take by mouth.      sacubitril-valsartan  (Entresto) 49-51 MG Tab per tablet Take 1 tablet by mouth 2 (two) times daily    sitaGLIPtin (JANUVIA) 100 MG tablet Take 100 mg by mouth daily. At night     spironolactone (ALDACTONE) 25 MG tablet Take 1 tablet (25 mg total) by mouth daily    vitamin B-12 (CYANOCOBALAMIN) 100 MCG tablet Take 1,000 mcg by mouth daily          Review of Systems:   A comprehensive review of systems was: negative except as stated in HPI    Physical Exam:     Vitals:    08/31/18 1124   BP: 111/61   Pulse: 68   Resp: 16   Temp: 98.4 F (36.9 C)   SpO2: 94%       GEN: WD/WN  female, NAD  HEENT: atraumatic, anicteric, neck supple  LUNGS: nonlabored breathing  EXTREMITIES: left arm with 2+ edema from the hand to shoulder. Warm, motor and sensation grossly intact. Palpable radial and ulnar pulses.     Labs:     Recent Labs   Lab 08/31/18  0346 08/30/18  1849 08/27/18  0522   WBC 20.38* 20.39* 15.23*   RBC 3.50* 3.96 3.49*   Hgb 10.5* 12.0 10.5*   Hematocrit 31.4* 35.5 31.8*     Recent Labs   Lab 08/31/18  0752 08/31/18  0346 08/30/18  1849   Glucose 218* 210* 250*   BUN 37.0* 37.0* 36.0*   Creatinine 1.5* 1.4* 1.7*   Sodium 135* 132* 134*   CO2 21* 22 22     Recent Labs   Lab 08/31/18  0346 08/30/18  2013   PT INR 1.2* 1.1   PTT 36 20*         Rads:     Radiology Results (24 Hour)     **  No results found for the last 24 hours. **          Radiological Procedure reviewed.    Assessment   Tara Perry is a 73 y.o. year old female with PMH of cardiac arrest with AICD placement, and now presenting with mild LUE swelling in setting of LUE DVT, likely due to recent lead placement.      Plan:   - Continue Lovenox  - Repeat LUE ultrasound tomorrow  - If significantly symptomatic, will pursue suction thrombectomy.   - Otherwise, continue anticoagulation and a 1 month follow up with Dr. Kathi Ludwig will be arranged as outpatient.       Lorenso Quarry, PA-C  Interventional Radiology  [facility]  Spectralink/Inpatient consults 7:30am-5pm  Monday-Friday: [phone]   After hours office ph: [phone], fax: [phone]

## 2018-08-31 NOTE — Progress Notes (Addendum)
MEDICINE PROGRESS NOTE    Date Time: 08/31/18 7:34 AM  Patient Name: Tara Perry  Attending Physician: Jetta Lout, MD    Reason for admission: LUE DVT    Assessment/Plan:  1. LUE DVT: continue therapeutic lovenox and recheck [place] tomorrow.    2. AKI on CKD Stage 3: baseline Cr ~1.2; continue IVF x 12h more and recheck labs tomorrow.    3. Recent Vfib arrest s/p BiV-AICD placement on 6/5: monitor on tele. Outpt cardiology f/u.    4. Chronic HFrEF: continue statin, B-clocker, spironolactone. Looks euvolemic. Monitor. Restart entresto tomorrow if Cr improved.    5. DM: continue current insulin regimen and monitor accuchecks.    6. Anemia: follow Hb and transfuse if < 7.    Dispo: home 1-2 days    D/w daughter on phone who will communicate updates with her brother.    Subjective: No CP, No SOB. LUE less swollen. No bleeding.    Objective:  VITAL SIGNS PHYSICAL EXAM   Temp:  [97.2 F (36.2 C)-98.6 F (37 C)] 98.1 F (36.7 C)  Heart Rate:  [70-74] 73  Resp Rate:  [17-18] 18  BP: (106-131)/(52-70) 119/66                No intake or output data in the 24 hours ending 08/31/18 0734 Physical Exam  Constitutional: NAD, comfortable appearing  HEENT: anicteric, moist conjunctiva, PERRL  CV: RRR, no m/r/g, 1+ LUE edema  Chest: CTAB, nl resp effort  Abd: soft, NTND, NABS  Skin: nl temp/tone/turgor, no rashes  Neuro: CN 2-12 grossly intact, no focal sensory deficits  Psych: A+O x 3, appropriate affect         Meds reviewed.    Labs:  Recent Labs   Lab 08/31/18  0346 08/30/18  1849   WBC 20.38* 20.39*   Hgb 10.5* 12.0   Hematocrit 31.4* 35.5   Platelets 213 157       Recent Labs   Lab 08/31/18  0346 08/30/18  2013   PT 15.4* 13.6   PT INR 1.2* 1.1   PTT 36 20*    Recent Labs   Lab 08/31/18  0346 08/30/18  1849   Sodium 132* 134*   Potassium 4.2 4.6   Chloride 100 100   CO2 22 22   BUN 37.0* 36.0*   Creatinine 1.4* 1.7*   Calcium 9.5 10.0   Glucose 210* 250*                 Lines:  Patient Lines/Drains/Airways Status     Active PICC Line / CVC Line / PIV Line / Drain / Airway / Intraosseous Line / Epidural Line / ART Line / Line / Wound / Pressure Ulcer / NG/OG Tube     Name:   Placement date:   Placement time:   Site:   Days:    Peripheral IV 08/30/18 Right Antecubital   08/30/18    1833    Antecubital   less than 1    Wound 08/26/18 Surgical Incision Chest Left BIV ICD IMPLANT   08/26/18    1401    Chest   4              DVT prophylaxis:  CHEST guideline (See page [id number]) Chemical   Foley:  [place] Rn Foley protocol Not present   IVs:  Peripheral IV       Signed by: Jetta Lout, MD  Available on Tigertext  Spectralink [phone]  Pager [phone]

## 2018-09-01 ENCOUNTER — Inpatient Hospital Stay: Payer: Medicare Other

## 2018-09-01 ENCOUNTER — Encounter
Admission: EM | Disposition: A | Discharge status: Home Health | Payer: Self-pay | Source: Home / Self Care | Attending: Internal Medicine

## 2018-09-01 DIAGNOSIS — E871 Hypo-osmolality and hyponatremia: Secondary | ICD-10-CM

## 2018-09-01 DIAGNOSIS — D62 Acute posthemorrhagic anemia: Secondary | ICD-10-CM

## 2018-09-01 DIAGNOSIS — I428 Other cardiomyopathies: Secondary | ICD-10-CM

## 2018-09-01 DIAGNOSIS — E1165 Type 2 diabetes mellitus with hyperglycemia: Secondary | ICD-10-CM

## 2018-09-01 DIAGNOSIS — I509 Heart failure, unspecified: Secondary | ICD-10-CM

## 2018-09-01 DIAGNOSIS — K920 Hematemesis: Secondary | ICD-10-CM

## 2018-09-01 DIAGNOSIS — N179 Acute kidney failure, unspecified: Secondary | ICD-10-CM

## 2018-09-01 DIAGNOSIS — R578 Other shock: Secondary | ICD-10-CM

## 2018-09-01 DIAGNOSIS — R451 Restlessness and agitation: Secondary | ICD-10-CM

## 2018-09-01 HISTORY — PX: EGD: SHX3789

## 2018-09-01 LAB — CBC
Absolute NRBC: 0 10*3/uL (ref 0.00–0.00)
Absolute NRBC: 0 10*3/uL (ref 0.00–0.00)
Hematocrit: 26 % — ABNORMAL LOW (ref 34.7–43.7)
Hematocrit: 26.5 % — ABNORMAL LOW (ref 34.7–43.7)
Hgb: 8.8 g/dL — ABNORMAL LOW (ref 11.4–14.8)
Hgb: 8.8 g/dL — ABNORMAL LOW (ref 11.4–14.8)
MCH: 30.6 pg (ref 25.1–33.5)
MCH: 30.3 pg (ref 25.1–33.5)
MCHC: 33.8 g/dL (ref 31.5–35.8)
MCHC: 33.2 g/dL (ref 31.5–35.8)
MCV: 90.3 fL (ref 78.0–96.0)
MCV: 91.4 fL (ref 78.0–96.0)
MPV: 11.3 fL (ref 8.9–12.5)
MPV: 11 fL (ref 8.9–12.5)
Nucleated RBC: 0 /100 WBC (ref 0.0–0.0)
Nucleated RBC: 0 /100 WBC (ref 0.0–0.0)
Platelets: 221 10*3/uL (ref 142–346)
Platelets: 225 10*3/uL (ref 142–346)
RBC: 2.88 10*6/uL — ABNORMAL LOW (ref 3.90–5.10)
RBC: 2.9 10*6/uL — ABNORMAL LOW (ref 3.90–5.10)
RDW: 13 % (ref 11–15)
RDW: 13 % (ref 11–15)
WBC: 16.13 10*3/uL — ABNORMAL HIGH (ref 3.10–9.50)
WBC: 15.68 10*3/uL — ABNORMAL HIGH (ref 3.10–9.50)

## 2018-09-01 LAB — GFR: EGFR: 40

## 2018-09-01 LAB — BASIC METABOLIC PANEL
Anion Gap: 9 (ref 5.0–15.0)
BUN: 44 mg/dL — ABNORMAL HIGH (ref 7.0–19.0)
CO2: 23 mEq/L (ref 22–29)
Calcium: 8.9 mg/dL (ref 7.9–10.2)
Chloride: 103 mEq/L (ref 100–111)
Creatinine: 1.3 mg/dL — ABNORMAL HIGH (ref 0.6–1.0)
Glucose: 226 mg/dL — ABNORMAL HIGH (ref 70–100)
Potassium: 4.2 mEq/L (ref 3.5–5.1)
Sodium: 135 mEq/L — ABNORMAL LOW (ref 136–145)

## 2018-09-01 LAB — TEG HEPARIN NEUTRALIZED
TEG Angle Neutralized: 75.4 (ref 55.2–78.4)
TEG CI Neutralized: 3.5 — ABNORMAL HIGH (ref <=3.00)
TEG K Time Neutralized: 1 (ref 0.8–2.8)
TEG R Time Neutralized: 3.9 (ref 2.5–7.5)
Teg MA Neutralized: 69.1 (ref 50.6–69.4)

## 2018-09-01 LAB — GLUCOSE WHOLE BLOOD - POCT
Whole Blood Glucose POCT: 225 mg/dL — ABNORMAL HIGH (ref 70–100)
Whole Blood Glucose POCT: 260 mg/dL — ABNORMAL HIGH (ref 70–100)
Whole Blood Glucose POCT: 298 mg/dL — ABNORMAL HIGH (ref 70–100)
Whole Blood Glucose POCT: 125 mg/dL — ABNORMAL HIGH (ref 70–100)

## 2018-09-01 LAB — PT AND APTT
PT INR: 1.2 — ABNORMAL HIGH (ref 0.9–1.1)
PT: 14.7 s (ref 12.6–15.0)
PTT: 33 s (ref 23–37)

## 2018-09-01 LAB — CROSSMATCH PRBC, 1 UNIT
Expiration Date: 202007222359
ISBT CODE: 7300
Status: TRANSFUSED
UTYPE: B POS

## 2018-09-01 LAB — TYPE AND SCREEN
AB Screen Gel: NEGATIVE
ABO Rh: B POS

## 2018-09-01 LAB — HEMOGLOBIN AND HEMATOCRIT, BLOOD
Hematocrit: 28.4 % — ABNORMAL LOW (ref 34.7–43.7)
Hematocrit: 28.5 % — ABNORMAL LOW (ref 34.7–43.7)
Hgb: 9.4 g/dL — ABNORMAL LOW (ref 11.4–14.8)
Hgb: 9.9 g/dL — ABNORMAL LOW (ref 11.4–14.8)

## 2018-09-01 SURGERY — DONT USE, USE 1095-ESOPHAGOGASTRODUODENOSCOPY (EGD), DIAGNOSTIC
Anesthesia: IV Sedation

## 2018-09-01 MED ORDER — FENTANYL CITRATE (PF) 50 MCG/ML IJ SOLN (WRAP)
50.00 ug | Freq: Once | INTRAMUSCULAR | Status: AC
Start: 2018-09-01 — End: 2018-09-01

## 2018-09-01 MED ORDER — FENTANYL CITRATE (PF) 50 MCG/ML IJ SOLN (WRAP)
INTRAMUSCULAR | Status: AC
Start: 2018-09-01 — End: 2018-09-01
  Administered 2018-09-01: 12:00:00 100 ug via INTRAVENOUS
  Filled 2018-09-01: qty 2

## 2018-09-01 MED ORDER — PANTOPRAZOLE SODIUM 40 MG IV SOLR
80.00 mg | Freq: Once | INTRAVENOUS | Status: AC
Start: 2018-09-01 — End: 2018-09-01
  Administered 2018-09-01: 07:00:00 80 mg via INTRAVENOUS
  Filled 2018-09-01: qty 80

## 2018-09-01 MED ORDER — FENTANYL CITRATE (PF) 50 MCG/ML IJ SOLN (WRAP)
INTRAMUSCULAR | Status: AC
Start: 2018-09-01 — End: 2018-09-01
  Administered 2018-09-01: 13:00:00 50 ug via INTRAVENOUS
  Filled 2018-09-01: qty 2

## 2018-09-01 MED ORDER — SODIUM CHLORIDE 0.9 % IV SOLN
INTRAVENOUS | Status: DC | PRN
Start: 2018-09-01 — End: 2018-09-03

## 2018-09-01 MED ORDER — EPINEPHRINE HCL 0.1 MG/ML IJ/IV SOSY (WRAP)
PREFILLED_SYRINGE | Status: DC | PRN
Start: 2018-09-01 — End: 2018-09-01
  Administered 2018-09-01: .2 mg via INTRAVENOUS

## 2018-09-01 MED ORDER — MIDAZOLAM HCL 1 MG/ML IJ SOLN (WRAP)
INTRAMUSCULAR | Status: AC
Start: 2018-09-01 — End: 2018-09-01
  Administered 2018-09-01: 12:00:00 4 mg via INTRAVENOUS
  Filled 2018-09-01: qty 4

## 2018-09-01 MED ORDER — GLUCAGON 1 MG IJ SOLR (WRAP)
INTRAMUSCULAR | Status: DC | PRN
Start: 2018-09-01 — End: 2018-09-01
  Administered 2018-09-01 (×2): .5 mg via INTRAVENOUS

## 2018-09-01 MED ORDER — FENTANYL CITRATE (PF) 50 MCG/ML IJ SOLN (WRAP)
100.00 ug | Freq: Once | INTRAMUSCULAR | Status: AC
Start: 2018-09-01 — End: 2018-09-01

## 2018-09-01 MED ORDER — ACETAMINOPHEN 325 MG PO TABS
650.0000 mg | ORAL_TABLET | Freq: Four times a day (QID) | ORAL | Status: DC | PRN
Start: 2018-09-01 — End: 2018-09-08
  Administered 2018-09-03 – 2018-09-08 (×9): 650 mg via ORAL
  Filled 2018-09-01 (×5): qty 2

## 2018-09-01 MED ORDER — PANTOPRAZOLE SODIUM 40 MG IV SOLR
8.00 mg/h | INTRAVENOUS | Status: DC
Start: 2018-09-01 — End: 2018-09-03
  Administered 2018-09-01 – 2018-09-03 (×5): 8 mg/h via INTRAVENOUS
  Filled 2018-09-01 (×11): qty 80

## 2018-09-01 MED ORDER — MIDAZOLAM HCL 1 MG/ML IJ SOLN (WRAP)
2.00 mg | Freq: Once | INTRAMUSCULAR | Status: AC
Start: 2018-09-01 — End: 2018-09-01

## 2018-09-01 MED ORDER — PANTOPRAZOLE SODIUM 40 MG IV SOLR
40.00 mg | Freq: Two times a day (BID) | INTRAVENOUS | Status: DC
Start: 2018-09-01 — End: 2018-09-01

## 2018-09-01 MED ORDER — MIDAZOLAM HCL 1 MG/ML IJ SOLN (WRAP)
4.00 mg | Freq: Once | INTRAMUSCULAR | Status: AC
Start: 2018-09-01 — End: 2018-09-01

## 2018-09-01 MED ORDER — MIDAZOLAM HCL 1 MG/ML IJ SOLN (WRAP)
INTRAMUSCULAR | Status: AC
Start: 2018-09-01 — End: 2018-09-01
  Administered 2018-09-01: 13:00:00 2 mg via INTRAVENOUS
  Filled 2018-09-01: qty 4

## 2018-09-01 SURGICAL SUPPLY — 42 items
BITE BLOCK MAXI 60F LATEX FREE (Procedure Accessories) ×1
BLOCK BITE MAXI 60FR LF STRD STRAP SDPRT (Procedure Accessories) ×1
BLOCK BITE OD60 FR STURDY STRAP SIDEPORT (Procedure Accessories) ×1
BLOCK BITE OD60 FR STURDY STRAP SIDEPORT DENTAL RETENTION RIM MAXI (Procedure Accessories) ×1 IMPLANT
CANISTER CRD SUCTION 1000CC (Procedure Accessories) ×2 IMPLANT
CATH GOLD PROBE HEMO 7F 300CM (Procedure Accessories) ×1
CATHETER ELHMST HMGLD GLDPRB 7FR 300CM (Procedure Accessories) ×1
CATHETER OD7 FR L300 CM BIPOLAR ROUND (Procedure Accessories) ×1
CATHETER OD7 FR L300 CM BIPOLAR ROUND DISTAL TIP STANDARD CONNECTOR (Procedure Accessories) ×1 IMPLANT
CLIP INTNL 16MM INSTINCT HMCLP 7FR 230CM (Clip) ×2 IMPLANT
CONTAINER HISTOLOGY 60 ML 30 ML GRADUATE LEAK RESISTANT O RING PREFILL (Procedure Accessories) IMPLANT
FORCEPS BIOPSY L240 CM STANDARD CAPACITY (Disposable Instruments)
FORCEPS BIOPSY L240 CM STANDARD CAPACITY NEEDLE OD2.2 MM RADIAL JAW (Disposable Instruments) IMPLANT
FORCEPS BX RADIAL JAW 4 2.8MM (Disposable Instruments)
FORCEPS BX STD CPC RJ 4 2.2MM 240CM STRL (Disposable Instruments)
GLOVE EXAM LARGE NITRILE CHEMOTHERAPY POWDER FREE SENSE OATMEAL (Glove) ×1 IMPLANT
GLOVE EXAM NITRILE RESTORE LG (Glove) ×1
GLV EXAM NITRILE RESTORE LG (Glove) ×2
GOWN CP ELSTC WRIST REG/LG BL (Gown) ×1
GOWN ISL PP PE REG LG LF FULL BCK NK TIE (Gown) ×1
GOWN ISOLATION REGULAR LARGE FULL BACK NECK TIE ELASTIC CUFF (Gown) ×1 IMPLANT
HEMOCLIP INSTINCT ENDOSCP (Clip) ×2 IMPLANT
KIT ENDO W/ ORCA AND SEAL ONLY (Kits) ×2
KIT ENDOSCOPIC COMPLIANCE ENDOKIT (Kits) ×1
KIT ENDOSCOPIC COMPLIANCE ENDOKIT ORCAPOD 3 1.1 OZ (Kits) ×1 IMPLANT
NEEDLE CARR-LOCKE INJECT 25GX5 (Needles) ×1
NEEDLE SCLEROTHERAPY CARR-LOCKE OD25 GA ODSEC2.5 MM L230 CM INJECTION (Needles) ×1 IMPLANT
NEEDLE SCLEROTHERAPY OD25 GA ODSEC2.5 MM (Needles) ×1
NEEDLE SCLRTX SS TFLN CRLK 25GA 2.5MM (Needles) ×1
SOL FORMALIN 10% PREFILL 30ML (Procedure Accessories)
STERILE WATER 1000ML (Solution) ×1
SYRINGE 50 ML GRADUATE NONPYROGENIC DEHP (Syringes, Needles) ×1
SYRINGE 50 ML GRADUATE NONPYROGENIC DEHP FREE PVC FREE BD MEDICAL (Syringes, Needles) ×1 IMPLANT
SYRINGE MED 50ML LF STRL GRAD N-PYRG (Syringes, Needles) ×1
SYRINGE SLIP-TIP 60CC (Syringes, Needles) ×1
TUBING ENDOSCOPY EXTENSION (Endoscopic Supplies) ×2 IMPLANT
TUBING SCT UNV 3/16IN 12FT LF STRL F (Tubing) ×1
TUBING SUCTION OD3/16 IN L12 FT FEMALE (Tubing) ×1
TUBING SUCTION OD3/16 IN L12 FT FEMALE CONNECTOR RIBBED UNIVERSAL (Tubing) ×1 IMPLANT
TUBING SUCTN UNIV 3/16INX12FT (Tubing) ×1
WATER STERILE PVC FREE DEHP FREE 1000 ML (Solution) ×1 IMPLANT
WATER STRL 1000ML PIC LF PVC FR DEHP-FR (Solution) ×1

## 2018-09-01 NOTE — Nursing Progress Note (Signed)
Patient requested assistance to bathroom and reported not feeling well.  Suggestion to use bedside commode was agreeable to patient.  While situated on bedside commode patient began to vomit blood.  Blood was bright red with clots.  RN called for help and a RR was called.  MD came to bedside. Patient assessed, type and screen sent and patient was transferred to the CICU.  Bedside report given to Lane, Charity fundraiser.

## 2018-09-01 NOTE — Progress Notes (Addendum)
PROGRESS NOTE    Date Time: 09/01/18 2:18 PM  Patient Name: Tara Perry, Tara Perry      Assessment:   73 y/o female with LUE DVT post AICD placement now post new UGIB with drop in H/H, and endoscopic clipping of two non bleeding duodenal ulcers. In comparison with yesterday, arm appears improved with softening up to the brachium and more definition of the hand. Radial/ulnar pulses intact.     Patient unable to appropriately answer my questions.    If successful clipping and H/H is stable, will have to reconsider anticoagulation. If she continues to have hematemesis, would suggest GDA embolization, then make decision on [facility].      Patient appears more confused this AM--discussed with ICU team, she was recently under conscious sedation for her EGD.    Plan:   Monitor H/H and hemodynamics, eval recovery from GIB.    If stable, can consider re starting AC with heparin to evaluate tolerability    If continued bleeding despite endoscopic intervention and not being on anticoagulation, suggest GDA embo.    Will obtain LUE sono to evaluate residual thrombus given improvement on AC.         Subjective:   Confused    Medications:     Current Facility-Administered Medications   Medication Dose Route Frequency   . atorvastatin  20 mg Oral Daily   . insulin lispro  1-3 Units Subcutaneous QHS   . insulin lispro  1-5 Units Subcutaneous TID AC   . vitamin B-12  1,000 mcg Oral Daily         Physical Exam:     Vitals:    09/01/18 1000   BP: 152/64   Pulse: 72   Resp: 21   Temp: 98 F (36.7 C)   SpO2: 99%       AAOx2  CV: RRR  Resp: No acute distress, coughing.  O2 sat normal.   LUE: Still edematous compared to other side but much more soft with overall decrease in hand edema.     I & O     Intake and Output Summary (Last 24 hours) at Date Time    Intake/Output Summary (Last 24 hours) at 09/01/2018 1418  Last data filed at 09/01/2018 1000  Gross per 24 hour   Intake 310 ml   Output 500 ml   Net -190 ml         Labs:     Lab Results   Component  Value Date/Time    WBC 15.68 (H) 09/01/2018 06:21 AM    WBC 14.1 (H) 08/03/2006 05:00 AM    HCT 28.4 (L) 09/01/2018 11:57 AM    INR 1.2 (H) 09/01/2018 06:21 AM    PT 14.7 09/01/2018 06:21 AM    PTT 33 09/01/2018 06:21 AM    BUN 44.0 (H) 09/01/2018 05:05 AM    CREAT 1.3 (H) 09/01/2018 05:05 AM    GLU 226 (H) 09/01/2018 05:05 AM    K 4.2 09/01/2018 05:05 AM        Rads:     Radiology Results (24 Hour)     Procedure Component Value Units Date/Time    US Venous Duplex Doppler Arm Left [[id number]] Collected:  09/01/18 1040    Order Status:  Completed Updated:  09/01/18 1048    Narrative:       HISTORY: Arm swelling after pacer placement.    COMPARISON: None    TECHNIQUE: Duplex evaluation of the left upper extremity deep venous  system  FINDINGS:   The left internal jugular vein is patent and compressible. Pacer wires  are partially visualized in the left subclavian vein. There is  nonocclusive thrombus within the left subclavian vein and occlusive  thrombus in the left axillary vein. There is nonocclusive thrombus in  the cephalic vein and one branch of the brachial veins. The second  branch of the brachial veins is patent and compressible.       Impression:         Extensive occlusive and nonocclusive left upper extremity DVT in the  region of left pacer wire, as detailed. Per discussion with clinical  team at the time of dictation, there are aware of this finding.    Janina Mayo, MD   09/01/2018 10:46 AM           Signed by: Barbaraann Faster, MD

## 2018-09-01 NOTE — Consults (Signed)
GASTROHEALTH  CONSULTATION NOTE  [facility] call: [id number], [id number]  [facility] call: [phone]  After hours call [phone]        Date Time: 09/01/18 1:38 PM  Patient Name: Tara Perry  Requesting Physician: Julieanne Manson, MD       Reason for Consultation:   Hematemesis   Anemia     Assessment and Plan:   Assessment:  1. GI bleed presenting as 1 episode of hematemesis this morning s/p initiation of Lovenox yesterday. DDx: PUD, erosive esophagitis/gastritis, AVMs, ulcerated polyps or mass   2. Acute blood loss anemia, secondary to above   3. Acute DVT, received 2 doses of Lovenox yesterday  4. Recent v-fib arrest s/p BiV-AICD placement on 6/5  5. Chronic heart failure    Plan:  1. Monitor H/H and for overt GI bleed, transfuse PRN  2. Continue pantoprazole gtt  3. Will plan for bedside EGD for further evaluation. The upper endoscopy procedure was explained to the patient, including indication, alternatives, possible benefits, and potential risks (including but not limited to bleeding, infection, perforation, aspiration, anesthesia complications such as slowed breathing and lowered blood pressure). She appeared to understand and agrees to proceed.  4. Please keep patient NPO in anticipation of EGD today  5. Continue to hold Lovenox at this time   6. Supportive care per primary team     History:   NIAYA HICKOK is a 73 y.o. female with history of recent DM2, recent admission for cardiac arrest and resultant cardiomyopathy (EF 21%) discharged on 6/5 who presents to the hospital on 08/30/2018 with left arm swelling and discoloration found to have acute DVT on [place].      Given acute LUE DVT, patient was started on Lovenox 60mg  SC BID and received 2 doses yesterday. We are being asked to see the patient after an episode of hematemesis this morning. Patient states that she felt the urge to have a BM, however, was suddenly felt nauseous and then had one episode of bright red blood with  clots. Patient reports feeling much better since this episode with no further hematemesis. She reports chronic upper abdominal pain and chest pain since compressions last admission without acute change in pain. Patient reports colonoscopy many years ago, but no history of EGD. She denies history of GI bleed.     On admission, patient's H/H found to be 12/35 downtrending this morning to 8.8/26.5. She is currently receiving 1U pRBCs.     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 She received 3 shocks, 1 round of epi with successful ROSC. 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/2018    per patient, he only sees peripheral view    Diabetes mellitus 1992    Gout spondylitis     Gout synovitis     LBBB (left bundle branch block)     LBBB chronic    NICM (nonischemic cardiomyopathy) 08/2018    NICM, EF 20%    Respiratory failure 08/2018    Hypoxic/anoxic respiratory failure, extubated 08/20/2018    Type 2 diabetes mellitus, controlled     Viral cardiomyopathy 2008       Past Surgical History:     Past Surgical History:  Procedure Laterality Date    COLONOSCOPY  2002    normal    EXPLORATORY LAPAROTOMY      ICD IMPLANT BIV N/A 08/26/2018    Procedure: ICD IMPLANT BIV;  Surgeon: Len Childs, MD;  Location: [facility];  Service: Cardiovascular;  Laterality: N/A;  medtronic    LHC W/ CORONARY ANGIOS AND LV Left 08/21/2018    Procedure: LHC W/ CORONARY ANGIOS AND LV;  Surgeon: Burna Forts, MD;  Location[facility];  Service: Cardiovascular;  Laterality: Left;       Family History:     Family History   Problem Relation Age of Onset    Cancer Father 38        bladder       Social History:     Social History     Socioeconomic History    Marital status: Widowed     Spouse name: Not on  file    Number of children: Not on file    Years of education: Not on file    Highest education level: Not on file   Occupational History    Not on file   Social Needs    Financial resource strain: Not on file    Food insecurity     Worry: Not on file     Inability: Not on file    Transportation needs     Medical: Not on file     Non-medical: Not on file   Tobacco Use    Smoking status: Never Smoker    Smokeless tobacco: Never Used   Substance and Sexual Activity    Alcohol use: Yes     Comment: social    Drug use: No    Sexual activity: Not on file   Lifestyle    Physical activity     Days per week: Not on file     Minutes per session: Not on file    Stress: Not on file   Relationships    Social connections     Talks on phone: Not on file     Gets together: Not on file     Attends religious service: Not on file     Active member of club or organization: Not on file     Attends meetings of clubs or organizations: Not on file     Relationship status: Not on file    Intimate partner violence     Fear of current or ex partner: Not on file     Emotionally abused: Not on file     Physically abused: Not on file     Forced sexual activity: Not on file   Other Topics Concern    Not on file   Social History Narrative    Not on file       Allergies:     Allergies   Allergen Reactions    Shellfish-Derived Products Anaphylaxis       Medications:     Current Facility-Administered Medications   Medication Dose Route Frequency    atorvastatin  20 mg Oral Daily    insulin lispro  1-3 Units Subcutaneous QHS    insulin lispro  1-5 Units Subcutaneous TID AC    vitamin B-12  1,000 mcg Oral Daily       Review of Systems:   General:  Patient denies lack of appetite, night sweats, weight loss, fatigue, fever.   HEENT:  Patient denies headache, hoarseness   Cardiovascular:  Patient denies  swelling of hands/feet, fainting/blacking out, chest pain.   Respiratory:  Patient denies chronic cough, difficulty breathing,  wheezing.   Genitourinary:  Patient denies blood in urine, dark urine  Musculoskeletal: Patient denies joint pain, joint stiffness, joint swelling.   Skin:  Patient denies itching, rash.   Neurologic:  Patient denies dizziness, loss of consciousness, fainting, confusion  Heme/Lymphatic:  Patient denies easy bruising.       Pertinent positives noted in HPI.    Physical Exam:     Vitals:    09/01/18 1000   BP: 152/64   Pulse: 72   Resp: 21   Temp: 98 F (36.7 C)   SpO2: 99%       General appearance: Well developed, well nourished, appears stated age and in NAD  Eyes: Sclera anicteric, pink conjunctivae, no ptosis  ENMT: mucous membranes moist, nose and ears appear normal.  Oropharynx clear.  Chest: Non labored respirations, no audible wheezing, no clubbing or cyanosis  CV:  Regular rate and rhythm, no JVD, no LE edema  Abdomen: soft, +mild upper abdominal TTP without rebound or guarding, non-distended, no masses or organomegaly  Skin: +pallor, no rashes, no suspicious skin lesions noted  Neuro: No gross movement disorders noted.  Mental status: Appropriate affect, alert and oriented x 3    Labs Reviewed:     Recent Labs     09/01/18  1157 09/01/18  0621 09/01/18  0505   WBC  --  15.68* 16.13*   Hgb 9.4* 8.8* 8.8*   Hematocrit 28.4* 26.5* 26.0*   Platelets  --  225 221   MCV  --  91.4 90.3       Recent Labs     09/01/18  0505 08/31/18  0752   Sodium 135* 135*   Potassium 4.2 4.4   Chloride 103 103   CO2 23 21*   BUN 44.0* 37.0*   Creatinine 1.3* 1.5*   Glucose 226* 218*   Calcium 8.9 9.1       No results for input(s): AST, ALT, ALKPHOS, BILITOTAL, BILIDIRECT, PROT, ALB in the last 72 hours.    Recent Labs     09/01/18  0621 08/31/18  0346   PTT 33 36   PT 14.7 15.4*   PT INR 1.2* 1.2*        Radiology:   Radiological Procedure reviewed:    None pertinent to GI

## 2018-09-01 NOTE — Brief Op Note (Signed)
EGD performed.  See the report.

## 2018-09-01 NOTE — Progress Notes (Signed)
Evaluated ultrasound,  Compared to FR[facility] study dated 08/30/2018 there has been significant improvement in thrombus burden and recanalization.     Based on these findings and clinical improvement,  Would suggest planning for resuming AC when clinically appropriate.  I expect patient will do well clinically on AC alone.

## 2018-09-01 NOTE — Progress Notes (Signed)
[facility] Brief note    Patient underwent EGD, revealing a duodenal ulcer with visible vessels, now cauterized and clipped.  She tolerated the procedure well.  In discussion with IR and GI, we will plan to continue close observation off anticoagulation for at least the next 24 hours, with plans to slowly began heparin to prevent progression of her RUE DVT.

## 2018-09-01 NOTE — Significant Event (Addendum)
HOUSE MD EVALUATION NOTE    Date Time: 09/01/18 6:45 AM  Patient Name: Tara Perry  Attending Physician: Julieanne Manson, MD      Reason for Evaluation:    Hematemesis    Assessment:   Ms. Calamari is a 73 y.o. 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  RRT was called for hematemesis.  BP was initially soft at 80s/50s, but improved to 120s/80s.  Hgb has been noted to be dropping from 12 to 8.8 on this mornings check, with CBC post hematemesis episode pending.  Given difficulties with access and concern for active bleeding, called [facility] and patient transferred to ICU.    Plan:    Labs: stat CBC, coags, Type and screen   Ordered for 1 unit of blood; called blood bank and asked to type and screen and bring up blood as soon as possible; patient has been consented for transfusions   Pantoprazole 80 mg IV push, followed by infusion of 8 mg/hr, as discussed with GI   Consulted GI (Dr. Selena Batten) and he will evaluate patient in ICU for possible endoscopy   Lovenox discontinued   NPO   Access: patient is difficult stick and will need a second IV access   Rest of plan per ICU team    Case discussed with: bedside nurse, charge nurse, nurse supervisor, RRT nurse, ICU attending and PA    Subjective:   Tara Perry is a 73 y.o. 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  RRT was called for hematemesis, with patient soaking gown in blood and clots when she vomit while attempting to have bowel movement.  BP was initially soft at 80s/50s per nursing, but improved to 120s/80s (small amount of bolus fluids given).  Patient reported having chest pain from CPR, unchanged over  the last few days; She reported that abdominal pain she had prior to the emesis resolved.  Denies any nausea or any other symptoms.  Hgb has been noted to be dropping from 12 to 8.8 on this morning's check, with CBC post hematemesis episode pending.  Type and screen and coags also ordered.  Given difficulties with access and active bleeding called [facility] and patient transferred to ICU.    Physical Exam:   BP 94/55    Pulse 78    Temp 97.7 F (36.5 C) (Oral)    Resp 15    Ht 1.651 m (5\' 5" )    Wt 61.6 kg (135 lb 14.4 oz)    SpO2 98%    BMI 22.62 kg/m       Intake/Output Summary (Last 24 hours) at 09/01/2018 0645  Last data filed at 09/01/2018 0000  Gross per 24 hour   Intake 300 ml   Output 800 ml   Net -500 ml       General: awake, alert, oriented x 3; no acute distress.  Cardiovascular: regular rate and rhythm, slight systolic murmur  Lungs: clear to auscultation bilaterally in anterior fields, decreased at bases  Abdomen: soft, non-tender, non-distended; normoactive bowel sounds, no rebound or guarding  Extremities: no clubbing, cyanosis; LUE with some edema    Meds:     Current Facility-Administered Medications   Medication Dose Route Frequency  atorvastatin  20 mg Oral Daily    carvedilol  25 mg Oral BID    enoxaparin  1 mg/kg Subcutaneous Q12H    insulin lispro  1-3 Units Subcutaneous QHS    insulin lispro  1-5 Units Subcutaneous TID AC    pantoprazole  80 mg Intravenous Once    spironolactone  25 mg Oral Daily    vitamin B-12  1,000 mcg Oral Daily       Labs:       RECENT LABS, reviewed:    Recent Labs   Lab 09/01/18  0621 09/01/18  0505   WBC 15.68* 16.13*   Hgb 8.8* 8.8*   Hematocrit 26.5* 26.0*   Platelets 225 221   MCV 91.4 90.3     Recent Labs   Lab 08/31/18  0346 08/30/18  2013   PT 15.4* 13.6   PTT 36 20*   PT INR 1.2* 1.1     Micro: Recent Labs   Lab 09/01/18  0505 08/31/18  0752  08/27/18  0522 08/26/18  0528   Sodium 135* 135*  More results in Results Review 139 141   Potassium 4.2 4.4  More  results in Results Review 4.2 3.7   Chloride 103 103  More results in Results Review 110 109   CO2 23 21*  More results in Results Review 17* 19*   BUN 44.0* 37.0*  More results in Results Review 36.0* 36.0*   Creatinine 1.3* 1.5*  More results in Results Review 1.2* 1.2*   Calcium 8.9 9.1  More results in Results Review 8.1 8.6   Magnesium  --   --   --  1.9 1.9   Glucose 226* 218*  More results in Results Review 246* 225*   More results in Results Review = values in this interval not displayed.          Signed by: Tenny Craw, MD

## 2018-09-01 NOTE — Nursing Progress Note (Addendum)
Shift Note:      Orientation: AOx 4, weak / soft voice d/t recent intubation  Rhythm on tele: NSR  Oxygen: RA  Ambulation: Standby w/ walker; generalized weakness  Pain: 4/10 to 8/10 to 3/10, increases with movement. PRN tylenol (liquid) given with mild relief this shift. Pt states Tylenol is "fine".   Lines/Drips: 20G RAC  GI/GU: cont x2 LBM: 6/21  Fall Score: Moderate    Critical Labs/Imaging/Procedures:     Comments:    - L arm restriction, DVT  - see doc flow for complete assessment and vitals.     Significant Shift Events and Questions for Attending:    Plan:   - 6/21 : [place] Left arm  - Lovenox  - I&Os / DW  - Monitor surgical chest incision   - Follow Hb and transfuse if < 7  - Pain management     (Braden Score: 17)

## 2018-09-01 NOTE — Progress Notes (Signed)
[facility]- CVICU  Medical Critical Care Service [facility]) Progress Note        Date / Time: 06/21/206:44 AM Room:   FI118/FI118-01  Patient:   Tara Perry, Tara Perry   Admitted: 08/30/2018  Attending:   Julieanne Manson, MD   Status: Full Code     Critical Care PROGRESS Note --  Hospital Day /  LOS: 2 days         Assessment:   Tara Perry is a 73 y.o. female with past medical history of diabetes, CHF, viral cardiomyopathy, recent cardiac arrest now with AICD placement presented back to [facility]/30/2018 with mild left upper extremity swelling in the setting of an extensive left lower extremity DVT who was placed on Lovenox and now has hematemesis with new onset anemia.     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   - CAM-ICU monitoring with early initiation of atypical antipsychotics as needed for control of agitated delirium.    - OOBTC with PT/OT consult as indicated.   - Tylenol 650 mg as needed for mild to moderate pain control as well as fever control.    CV: Hemorrhagic shock, recent V. fib arrest status post AICD placement, congestive heart failure, left bundle branch block, nonischemic cardiomyopathy  - MAP goal > 65 mmHg, prefer to rely upon blood product transfusion first  with vasopressors only as needed to achieve blood pressure goals.  - POC bedside echocardiography for assessment of dynamic cardiac status and titration of potential inotropic therapy.   - Hold home blood pressure medications: Coreg and spironolactone.   - Lipitor 20 mg p.o. nightly.    Pulm: Risk for acute hypoxic respiratory failure, risk for aspiration  - Supplemental oxygen as needed to meet oxygenation goals.  - SpO2 goal greater than 88%, which correlates to PaO2 greater than 55 mmHg.   - Head of bed greater than 30 degrees and aggressive pulmonary toileting.    - If hematemesis persists, low threshold to initiate endotracheal intubation to protect airway.    GI: Upper GI bleed, risk for constipation, risk for malnutrition  - GI consult in place, keep n.p.o. for EGD.   - Protonix 40 mg IV twice daily.   - No evidence of liver failure, no role for octreotide infusion at the present time.   - Goal 1 bowel movement per day, low threshold to add promotility agents and/or stool softeners.    GU: Acute on chronic kidney injury, hyponatremia  - Renally dose all medications and avoid nephrotoxins.   - Electrolyte repletion per protocol.  - Bedside echo for POC assessment of volume status and guidance of volume resuscitation.  Endo: Hyperglycemia  - SSI for BG goal 120-180 mg/dL.    Heme: Anemia due to acute blood loss, coagulopathy, DVT the left upper extremity  - Transfuse for Hgb goal > 7.0 g/dL.   - CBC every 6 to monitor blood loss.  - Transfuse for plt goal > 40 K/mcL.  - Extensive DVT noted to upper extremity (left subclavian, axillary, brachial, basilic, and cephalic veins), hold Lovenox.   - PRN TEG for detection and assistance with correction of ongoing coagulopathy.    ID: Leukocytosis  - Leukocytosis felt to be related to GI bleed, no evidence of need for antibiotics at the present time.    Code Status: Full Code     Physical Exam:   BP 94/55    Pulse 78    Temp 97.7 F (36.5 C) (Oral)    Resp 15    Ht  1.651 m (5\' 5" )    Wt 61.6 kg (135 lb 14.4 oz)    SpO2 98%    BMI 22.62 kg/m      Physical Exam  Vitals signs and nursing note reviewed.   Constitutional:       Comments: Global pallor   HENT:      Head: Normocephalic.      Nose: Nose normal.      Mouth/Throat:      Mouth: Mucous membranes are moist.   Eyes:      Extraocular Movements: Extraocular movements intact.      Pupils: Pupils are equal, round, and reactive to light.   Neck:      Musculoskeletal: Normal range of motion and neck supple.   Cardiovascular:      Rate and Rhythm: Normal rate.      Pulses: Normal pulses.   Abdominal:      General: Abdomen is flat. There is no distension.      Palpations: Abdomen is soft.   Musculoskeletal: Normal range of motion.   Skin:     General: Skin is warm.   Neurological:      General: No focal deficit present.      Mental Status: She is alert.      Cranial Nerves: No cranial nerve deficit.       This patient has a high probability of sudden clinically significant deterioration which requires the highest level of physician preparedness to intervene urgently. I managed/supervised life or organ supporting interventions that required frequent physician assessments. I devoted my full attention in the ICU to the direct care of this patient for this period of time.  Organ systems that require intensive critical care support (and are described in more detail in the note assessment and plan above)  include: Neuro, cardiac, vascular, GI, GU, endocrine, and heme.     Any critical care time was performed today and is exclusive of teaching, billable procedures, and not overlapping with any other providers.     Critical care time: 39 minutes.    Signed by: Fredrich Birks III  09/01/2018 6:44 AM  RU:[id number], Marlynn Perking, MD

## 2018-09-01 NOTE — Plan of Care (Signed)
1 unit PRBC given. Hgb stable, refer to results.   EGD done at bedside, 8mg  versed and 150 fentanyl given. Blood pressures remained stable. Refer to notes for EGD results.

## 2018-09-02 LAB — CBC
Absolute NRBC: 0 10*3/uL (ref 0.00–0.00)
Hematocrit: 30 % — ABNORMAL LOW (ref 34.7–43.7)
Hgb: 10.1 g/dL — ABNORMAL LOW (ref 11.4–14.8)
MCH: 31.1 pg (ref 25.1–33.5)
MCHC: 33.7 g/dL (ref 31.5–35.8)
MCV: 92.3 fL (ref 78.0–96.0)
MPV: 11 fL (ref 8.9–12.5)
Nucleated RBC: 0 /100 WBC (ref 0.0–0.0)
Platelets: 230 10*3/uL (ref 142–346)
RBC: 3.25 10*6/uL — ABNORMAL LOW (ref 3.90–5.10)
RDW: 13 % (ref 11–15)
WBC: 15.69 10*3/uL — ABNORMAL HIGH (ref 3.10–9.50)

## 2018-09-02 LAB — BASIC METABOLIC PANEL
Anion Gap: 9 (ref 5.0–15.0)
BUN: 47 mg/dL — ABNORMAL HIGH (ref 7.0–19.0)
CO2: 23 mEq/L (ref 22–29)
Calcium: 8.7 mg/dL (ref 7.9–10.2)
Chloride: 106 mEq/L (ref 100–111)
Creatinine: 1.3 mg/dL — ABNORMAL HIGH (ref 0.6–1.0)
Glucose: 147 mg/dL — ABNORMAL HIGH (ref 70–100)
Potassium: 4.8 mEq/L (ref 3.5–5.1)
Sodium: 138 mEq/L (ref 136–145)

## 2018-09-02 LAB — HEMOGLOBIN AND HEMATOCRIT, BLOOD
Hematocrit: 29.6 % — ABNORMAL LOW (ref 34.7–43.7)
Hgb: 9.9 g/dL — ABNORMAL LOW (ref 11.4–14.8)

## 2018-09-02 LAB — GLUCOSE WHOLE BLOOD - POCT
Whole Blood Glucose POCT: 165 mg/dL — ABNORMAL HIGH (ref 70–100)
Whole Blood Glucose POCT: 237 mg/dL — ABNORMAL HIGH (ref 70–100)
Whole Blood Glucose POCT: 174 mg/dL — ABNORMAL HIGH (ref 70–100)
Whole Blood Glucose POCT: 178 mg/dL — ABNORMAL HIGH (ref 70–100)

## 2018-09-02 LAB — MRSA CULTURE
Culture MRSA Surveillance: NEGATIVE
Culture MRSA Surveillance: NEGATIVE

## 2018-09-02 LAB — GFR: EGFR: 40

## 2018-09-02 NOTE — Plan of Care (Signed)
Interventional Radiology  PLAN OF CARE    Date Time: 09/02/18 10:56 AM  Patient Name: KEENAN, DIMITROV      Assessment:   IOMA CHISMAR is a 73 y.o. female with recent cardiac arrest with AICD placement presenting with mild LUE swelling in setting of LUE DVT, likely due to recent lead placement. Repeat ultrasound shows extensive occlusive and nonocclusive LUE DVT in the region of the left pacer wire, significantly improved compared to FRC study dated 6/19. Course was complicated by new UGIB with drop in H/H s/p endoscopic clipping of two non bleeding duodenal ulcers 6/21. Lovenox discontinued 6/21 AM in light of UGIB with plans to slowly begin heparin after 24 hour period if clinically appropriate.     - Afebrile, VSS  - H/H stable x 3, 9.4 --> 9.9 --> 10.1    Plan:   Discussed with IR attending. Based on clinical improvement, recommend resume systemic anticoagulation per primary team and monitor response. If persistent/worsening symptoms, consider thrombectomy.    Anselmo Pickler, PA-C  Interventional Radiology  [facility]  Spectralink/Inpatient consults 7:30am-5pm Monday-Friday: [phone]   After hours office ph: [phone], fax: [phone]

## 2018-09-02 NOTE — Progress Notes (Signed)
Pt drowsy, oriented x3, disoriented to time.   Afebrile, denies pain or SOB. VSS, on 2LNC.   LUE cooler than RUE. Swelling to LUE.   Continue protonix gtt.   Foley to gravity drainage, low UOP. [facility] aware, no order for IVF.   Trending H&H, stable.   NPO.   Will continue to monitor for further changes.

## 2018-09-02 NOTE — Plan of Care (Signed)
I spoke with Tara Perry, the patient's daughter and updated her on the plan to transfer the patient to the [facility] for further monitoring and management.  I explained that we will begin the process of resumption of anticoagulation tomorrow with close monitoring for any signs of renewed bleeding.  All her questions were answered.

## 2018-09-02 NOTE — Progress Notes (Addendum)
[facility]- Medical Critical Care Service  Progress note        Date Time: 09/02/18 2:22 PM  Patient Name: Tara Perry  Attending Physician: Lianne Moris, MD  Room: ZO109/UE454-09   Admit Date: 08/30/2018  LOS: 3 days           Assessment:.        UGIB from a duodenal ulcer    Acute left upper extremity DVT    NICM    DM    HLD        Plan:.       Trend H&H every 12 hours.    Clear liquid diet.    Plan resumption of therapeutic anticoagulation with a heparin infusion, increase H&H to every six hours.    Continue baseline atorvastatin.    Continue glycemic control.     Consult cardiology for resumption of GDMT.            History of present illness:.        This is a 73 year old woman with NICM , DM recently discharged following a VF cardiac arrest from which she recovered neurologically.  An ICD was placed prior to discharge.  Two days later, she returned to the ED with a new left upper extremity swelling secondary to an acute DVT in the left subclavian vein, likely associated with the presence of pacer wires.  IR was consulted and recommended therapeutic enoxaparin.  She was transferred to the CICU following an episode of acute hematemesis.  She underwent EGD which revealed a large duodenal ulcer with visible vessels which were cauterized and clipped.  She remained in the CICU overnight for continuing observation with no further hematemesis and with a stable hemoglobin.  A repeat ultrasound of the DVT showed reduction in size with no indication for thrombectomy.  In discussion with GI today, they recommend waiting an additional 24 hours before slowly resuming anticoagulation with a heparin drip.  In the meantime, she can be started on a clear liquid diet and advance as tolerated.        Medical history:     Patient Active Problem List   Diagnosis    Peritonitis    Type II diabetes mellitus without mention of complication    Gout synovitis    Type 2 diabetes mellitus without complications     Viral cardiomyopathy    CHF (congestive heart failure)    Cardiac arrest    Atrophic vaginitis    Chronic vulvitis    Arm DVT (deep venous thromboembolism), acute, left             Physical Exam:     Vitals:    09/02/18 1300   BP: 119/55   Pulse: 71   Resp: 22   Temp:    SpO2: 97%         Intake/Output Summary (Last 24 hours) at 09/02/2018 1422  Last data filed at 09/02/2018 1300  Gross per 24 hour   Intake 210 ml   Output 1375 ml   Net -1165 ml           General Appearance:  well-nourished middle aged woman  Neuro: alert and intact   HEENT: normocephalic, anicteric, pink conjunctivae, moist oral mucosa  Neck: supple, no adenopathy  Cardiac: S1 S2, regular rate, no audible murmurs   Chest: equal expansion  Lungs: clear breath sounds bilaterally  Abdomen: soft, nontender, nondistended  Extremities: warm, no edema   Skin: no new rashes  Medications, lab results and imaging reviewed.        [facility] Attending attestation:       I have spent 74 minutes at the bedside greater than 50% of which was spent in clinical counseling and management regarding the patient's GIB and DVT.  Discussion participants included the patient and the medical team.        Signed by: Lianne Moris, MD  Date/Time: 09/02/18 2:22 PM

## 2018-09-02 NOTE — Progress Notes (Signed)
Readmission:  S:  6/15 AICD placement. Pt  presented back to [facility] 08/30/2018 with mild left upper extremity swelling in the setting of an extensive left lower extremity DVT.  B:  Pt was discharged home with home care.  Per daughter follow up appointments were lined up but pt end up in the hospital.   A:  Spoke with Festus Aloe- daughter [phone] to complete assessment.  Support system: [name] lives with her. Pt's children were very involved.  Pt was visited by a home are nurse.  Pt has no issues getting prescriptions.  Family can transport pt home upon discharge.  R:  Pt will be going home with family+ resumption of care from [facility].     09/02/18 0904   Patient Type   Within 30 Days of Previous Admission? Yes   Healthcare Decisions   Interviewed: Family   Name of interviewee if other than the pt: Festus Aloe- daughter [phone]   Orientation/Decision Making Abilities of Patient Confused, unable to make decisions  (AOX4 as baseline)   Advance Directive Patient does not have advance directive   (RETIRED) Healthcare Agent's Phone Number Festus Aloe- daughter [phone]   Prior to admission   Prior level of function Ambulates with assistive device;Needs assistance with ADLs   Type of Residence Private residence   Home Layout Multi-level   Have running water, electricity, heat, etc? Yes   Living Arrangements Family members  (sister)   How do you get to your MD appointments? children   How do you get your groceries? family   Who fixes your meals? family   Who does your laundry? family   Who picks up your prescriptions? family   Dressing Needs assistance   Grooming Needs assistance   Feeding Needs assistance   Bathing Needs assistance   Toileting Needs assistance   DME Currently at [facility] [place]   Home Care/Community Services Home Health;Home PT/OT/Speech;Skilled Nursing   Adult Protective Services (APS) involved? No   Discharge Planning   Support Systems Children;Family members    Patient expects to be discharged to: home ROC w/ [facility]   Anticipated [place] plan discussed with: Same as interviewed   [place] discussion contact information: same   Does the patient have perscription coverage? Yes   Consults/Providers   PT Evaluation Needed 1   OT Evalulation Needed 1   SLP Evaluation Needed 1   Outcome Palliative Care Screen Screened but did not meet criteria for intervention   Correct PCP listed in Epic? Yes   PCP   PCP on file was verified as the current PCP? Yes   Important Message from [facility] Notice   Patient received 1st IMM Letter? Yes   Date of most recent IMM given: 08/30/18       Christiana Pellant RN BSN  Case Manager   [facility]   [facility]   [address]   [place], [place] [address]   T [phone]

## 2018-09-02 NOTE — Progress Notes (Signed)
Patient transferred to [facility] room 443 with all belongings. Family aware of transfer.   Plan of care is to trend H/H's q12hr, start clear liquid diet and to start [facility] tomorrow.

## 2018-09-03 ENCOUNTER — Encounter: Payer: Self-pay | Admitting: Gastroenterology

## 2018-09-03 LAB — BASIC METABOLIC PANEL
Anion Gap: 10 (ref 5.0–15.0)
BUN: 37 mg/dL — ABNORMAL HIGH (ref 7.0–19.0)
CO2: 20 mEq/L — ABNORMAL LOW (ref 22–29)
Calcium: 8 mg/dL (ref 7.9–10.2)
Chloride: 105 mEq/L (ref 100–111)
Creatinine: 1.1 mg/dL — ABNORMAL HIGH (ref 0.6–1.0)
Glucose: 165 mg/dL — ABNORMAL HIGH (ref 70–100)
Potassium: 4.3 mEq/L (ref 3.5–5.1)
Sodium: 135 mEq/L — ABNORMAL LOW (ref 136–145)

## 2018-09-03 LAB — GFR: EGFR: 48.6

## 2018-09-03 LAB — CBC
Absolute NRBC: 0 10*3/uL (ref 0.00–0.00)
Hematocrit: 28.3 % — ABNORMAL LOW (ref 34.7–43.7)
Hgb: 9.3 g/dL — ABNORMAL LOW (ref 11.4–14.8)
MCH: 30.3 pg (ref 25.1–33.5)
MCHC: 32.9 g/dL (ref 31.5–35.8)
MCV: 92.2 fL (ref 78.0–96.0)
MPV: 10.7 fL (ref 8.9–12.5)
Nucleated RBC: 0 /100 WBC (ref 0.0–0.0)
Platelets: 240 10*3/uL (ref 142–346)
RBC: 3.07 10*6/uL — ABNORMAL LOW (ref 3.90–5.10)
RDW: 13 % (ref 11–15)
WBC: 15.24 10*3/uL — ABNORMAL HIGH (ref 3.10–9.50)

## 2018-09-03 LAB — APTT: PTT: 124 s — ABNORMAL HIGH (ref 23–37)

## 2018-09-03 LAB — HEMOGLOBIN AND HEMATOCRIT, BLOOD
Hematocrit: 26.6 % — ABNORMAL LOW (ref 34.7–43.7)
Hgb: 9.1 g/dL — ABNORMAL LOW (ref 11.4–14.8)

## 2018-09-03 LAB — GLUCOSE WHOLE BLOOD - POCT
Whole Blood Glucose POCT: 189 mg/dL — ABNORMAL HIGH (ref 70–100)
Whole Blood Glucose POCT: 159 mg/dL — ABNORMAL HIGH (ref 70–100)
Whole Blood Glucose POCT: 196 mg/dL — ABNORMAL HIGH (ref 70–100)
Whole Blood Glucose POCT: 178 mg/dL — ABNORMAL HIGH (ref 70–100)

## 2018-09-03 LAB — WHOLE BLOOD GLUCOSE POCT: Whole Blood Glucose POCT: 182 mg/dL — ABNORMAL HIGH (ref 70–100)

## 2018-09-03 MED ORDER — SACUBITRIL-VALSARTAN 24-26 MG PO TABS
24.0000 mg | ORAL_TABLET | Freq: Two times a day (BID) | ORAL | Status: DC
Start: 2018-09-03 — End: 2018-09-08
  Administered 2018-09-03 – 2018-09-08 (×10): 1 via ORAL
  Filled 2018-09-03 (×11): qty 1

## 2018-09-03 MED ORDER — HEPARIN (PORCINE) IN D5W 50-5 UNIT/ML-% IV SOLN (UNITS/KG/HR ONLY)
18.00 [IU]/kg/h | INTRAVENOUS | Status: DC
Start: 2018-09-03 — End: 2018-09-07
  Administered 2018-09-03: 13:00:00 18 [IU]/kg/h via INTRAVENOUS
  Administered 2018-09-04 – 2018-09-07 (×3): 11 [IU]/kg/h via INTRAVENOUS
  Filled 2018-09-03 (×4): qty 500

## 2018-09-03 MED ORDER — CARVEDILOL 3.125 MG PO TABS
3.1250 mg | ORAL_TABLET | Freq: Two times a day (BID) | ORAL | Status: DC
Start: 2018-09-03 — End: 2018-09-08
  Administered 2018-09-03 – 2018-09-08 (×10): 3.125 mg via ORAL
  Filled 2018-09-03 (×10): qty 1

## 2018-09-03 MED ORDER — PANTOPRAZOLE SODIUM 40 MG IV SOLR
40.00 mg | Freq: Two times a day (BID) | INTRAVENOUS | Status: DC
Start: 2018-09-03 — End: 2018-09-07
  Administered 2018-09-03 – 2018-09-07 (×8): 40 mg via INTRAVENOUS
  Filled 2018-09-03 (×8): qty 40

## 2018-09-03 NOTE — OT Eval Note (Signed)
Tara Perry   Occupational Therapy Evaluation     [name]    MRN#: [id number]   Unit: [facility] TOWER 4  Bed: A213/Y865.78                                     Post Acute Care Therapy Recommendations:   Discharge Recommendations: Home with supervision, Home with home health OT     Milestones to be reached to achieve recommendation: safe transfers, toileting, bed mobility  Anticipate achievement in 2-3 sessions    DME Recommended for Discharge: Shower chair    If Home with supervision, Home with home health OT  recommended discharge disposition is not available, patient will need rehab.     Therapy discharge recommendations may change with patient status.  Please refer to most recent note for up-to-date recommendations.    Assessment:   Significant Findings: none    Tara Perry is a 73 y.o. female admitted 08/30/2018. Pt with recent admission from 6/5-6/16 for Vfib arrest, pt discharged home with assist from family and HHOT/PT that have not started yet pt now with LUE DVT.  Patient presents with decreased activity tolerance, decreased balance, decreased bed mobility, dizziness, deconditioning, ICD precautions (from last admission) and pain negatively impacting ADL performance. Pt will continue to benefit from OT services to maximize independence and safety with ADLs and functional mobility.    Therapy Diagnosis: Decreased Independence with ADLs    Rehabilitation Potential: good for set goals     Treatment Activities: OT eval, ADL retraining, thera ex   Educated the patient to role of occupational therapy, plan of care, goals of therapy and safety with mobility and ADLs.    Plan:   OT Frequency Recommended: 3-4x/wk     Treatment Interventions: ADL retraining;Functional transfer training;Compensatory technique education;Endurance training     Risks/benefits/POC discussed with patient and dtr        Precautions and Contraindications:   Falls   ICD precautions     Consult  received for [name] for OT Evaluation and Treatment.  Patients medical condition is appropriate for Occupational Therapy intervention at this time.      History of Present Illness:    Tara Perry is a 73 y.o. female admitted on 08/30/2018 with "recent hospitalization for cardiac arrest and resultant cardiomyopathy presents to the emergency room with acute episode of left arm swelling and discoloration this morning.  The patient and the son noticed that around 8:00 in the morning and proceeded to contact the cardiology office for advice.  They were advised to get the ultrasound done and was subsequently diagnosed with left upper extremity DVT.  The patient is in no pain and has been recovering well at home and the incision has been clean, dry, with no drainage or bleeding.  The son and the patient does not endorse any change of temperature in the arms although 1 emergency room team member did document a difference of temperatures earlier.  The patient and her son did note a dark in coloration on the left arm compared to the right.  Other than that the patient reports regular bowel movements.  She still has some soreness in the upper abdominal area presumably secondary to resuscitation efforts during her cardiac arrest.  The patient reports normal urination and thought that the color was also normal.  When asked further regarding her intake,  they did admit that perhaps they have not been taking in as much as they are to in terms of fluid and food.    For summary of the patient's hospital course, please refer to discharge summary dated August 27, 2018.  The patient was discharged 3 days ago.  They have noted no changes in medications since discharge.  The blood pressure has been largely normal except for this morning during the process of getting advice with the cardiologist office, she did have one episode of blood pressure with systolic in the 70s was some dizziness but subsequently has recovered and has  not had hypotension prior to today." - per H&P    Admitting Diagnosis: Arm DVT (deep venous thromboembolism), acute, left [I82.622]    Past Medical/Surgical History:  Past Medical History:   Diagnosis Date    Atrophic vaginitis 08/30/2018    Cardiac arrest 08/17/2018    VF arrest. She received 3 shocks, 1 round of epi with successful ROSC. 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/2018    per patient, he only sees peripheral view    Diabetes mellitus 1992    Gout spondylitis     Gout synovitis     LBBB (left bundle branch block)     LBBB chronic    NICM (nonischemic cardiomyopathy) 08/2018    NICM, EF 20%    Respiratory failure 08/2018    Hypoxic/anoxic respiratory failure, extubated 08/20/2018    Type 2 diabetes mellitus, controlled     Viral cardiomyopathy 2008     Past Surgical History:   Procedure Laterality Date    COLONOSCOPY  2002    normal    EGD N/A 09/01/2018    Procedure: EGD;  Surgeon: Lilia Pro, MD;  Location: [facility];  Service: Gastroenterology;  Laterality: N/A;    EXPLORATORY LAPAROTOMY      ICD IMPLANT BIV N/A 08/26/2018    Procedure: ICD IMPLANT BIV;  Surgeon: Len Childs, MD;  Location: [facility];  Service: Cardiovascular;  Laterality: N/A;  medtronic    LHC W/ CORONARY ANGIOS AND LV Left 08/21/2018    Procedure: LHC W/ CORONARY ANGIOS AND LV;  Surgeon: Burna Forts, MD;  Location: [facility];  Service: Cardiovascular;  Laterality: Left;       Imaging/Tests/Labs:  Ct Head Wo Contrast    Result Date: 08/17/2018  No acute intracranial abnormality. Eloise Harman, MD  08/17/2018 12:36 AM    X-ray Chest Ap Portable    Result Date: 08/26/2018    No acute findings post ICD placement. Geanie Cooley, MD  08/26/2018 4:25 PM    Xr Chest Ap Portable    Result Date: 08/19/2018  Lines and tubes as described without pneumothorax. Bibasilar atelectasis without focal  consolidation identified. Colonel Bald, MD  08/19/2018 12:35 PM    Xr Chest Ap Portable    Result Date: 08/17/2018   Right IJ central venous catheter has been pulled back with tip now at the cavoatrial junction. Shelly Flatten, MD  08/17/2018 6:29 PM    Xr Chest Ap Portable    Result Date: 08/17/2018   1. Right IJ central venous catheter placement with tip in the right atrium. 2. Retrocardiac opacity may represent atelectasis or effusion. Shelly Flatten, MD  08/17/2018 5:28 PM    Chest Ap Portable    Result Date: 08/17/2018  1. ET tube tip 3.2 cm above the carina and gastric drainage tube sidehole in the stomach and tip below the field-of-view. 2. Central vascular congestion, likely upper lobe edema, and bibasilar atelectasis. Infection/aspiration cannot be entirely excluded. 3. Borderline enlarged cardiopericardial silhouette. Eloise Harman, MD  08/17/2018 12:11 AM    US Venous Duplex Doppler Arm Left    Result Date: 09/01/2018  Extensive occlusive and nonocclusive left upper extremity DVT in the region of left pacer wire, as detailed. Per discussion with clinical team at the time of dictation, there are aware of this finding. Janina Mayo, MD  09/01/2018 10:46 AM      Social History:   Prior Level of Function: Independent with ADLs and from to June, since recent admission pt has needed assistance at home for BADLs  Assistive Devices: currently uses RW, baseline is indep   Baseline Activity: Limited Household Ambulation since recent admission early June   DME Currently at Home: w/c, RW, [facility]  Home Living Arrangements: Alone and family has been staying with pt since d/c on 6/16 and will continue to assist   Type of Home: House       Home Layout: Two Level and pt staying on main level  Bathroom Layout: tub/shower unit   Home Living Notes: pt sleeping on recliner in living room    Subjective: "My dtr will be better to stay on me"    Patient is agreeable to participation in the therapy session. Nursing clears patient for therapy.      Patient Goal: to get better     Pain:   Scale: 4/10  Location: chest   Intervention: repositioned in bed     Objective:   Patient is in bed with IV, telemetry, MCCSU monitors in place.      Cognitive Status and Neuro Exam:  Arousal/Alertness: Appropriate responses to stimuli   Orientation Level: Oriented x4  Following Commands: {All commands without difficulty           Musculoskeletal Examination  RUE ROM: wfl  LUE ROM: wfl  RLE ROM: wfl  LLE ROM: wfl    RUE Strength: NT  LUE Strength: wfl    Sensory/Oculomotor Examination  Auditory: wfl  Tactile: wfl  Vision: wfl      Activities of Daily Living  Eating: Independent , hand to mouth   Grooming: Stand by Assist , in bed   Bathing:Minimal Assist , Moderate Assist   UE Dressing: Minimal Assist , Moderate Assist   LE Dressing: Moderate Assist   Toileting: Minimal Assist     Functional Mobility:  Rolling: Unable to Assess   Scooting EOB: Moderate Assist   Supine to Sit: Moderate Assist , with HOB elevated   Sit to Supine: Minimal Assist   Sit to Stand:Contact Guard Assist   Transfers: Unable to Assess   Functional Mobility: Contact Guard Assist , with Rw for few steps to [facility]    PMP Activity: Step 4 - Dangle at Bedside      Balance  Static Sitting: Good  Dynamic Sitting: Good-  Static Standing: Fair+  Dynamic Standing: Fair+    Participation and Activity Tolerance  Participation Effort: good   Endurance: fair d/t dizziness at EOB/OOB, BP elevated, RN aware     Patient left with call bell within reach, all needs met, SCDs as found, fall mat in place, bed alarm on, chair alarm na and all questions answered. RN notified of session outcome and patient response.  Goals:  Time For Goal Achievement: 5 visits  ADL Goals  Patient will groom self: Supervision, at sinkside  Patient will dress upper body: Independent  Patient will dress lower body: Modified Independent  Patient will toilet: Independent  Mobility and Transfer Goals  Pt will perform functional transfers:  Modified Independent, with rolling walker                         Jefferson Fuel, MS, OTR/L  Pager [phone]     Time of treatment:   OT Received On: 09/03/18  Start Time: 1540  Stop Time: 1625  Time Calculation (min): 45 min

## 2018-09-03 NOTE — Consults (Signed)
CARDIOLOGY CONSULTATION  [facility]    Date Time: 09/03/18 8:01 AM  Patient Name: Tara Perry  Requesting Physician: Lianne Moris, MD       Reason for Consultation:   Management of nonisch CM    Impression:      Admitted with LUE edema, +DVT LUE. (L cephalic vein, left axillary and the left subclavian vein were all accessed for PPM placementg 08/26/18)   Hematemesis (RRT 09/01/18), A/C held. EGD 09/01/18: nonbleeding DU s/p epi injection and cautery/clipping   OHCA 08/16/18, in VF at presentation, defib w/ ROSC, s/p Medtronic Bi-V ICD 08/26/18   LHC 08/22/18: normal cor's   Severe nonisch CM. Echo 08/19/18: LVEF 20%, RWMAs, no signif valve pathol. 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        Recommendation:      Hemodynamically stable, Hgb stable.    Will start low dose coreg and Entresto   Resume aldactone tomorrow 12.5 mg qD if lytes stable   Will follow.   A/C as per attg for DVT   No additional CV testing at this time.   History:   Tara Perry is a 73 y.o. female admitted on 08/30/2018.  We have been asked by Lianne Moris, MD,  to provide cardiac consultation, regarding management of known severe nonischemic cardiomyopathy.  The patient recently had a somewhat protracted hospital course.  She is admitted to hospital with out-of-hospital cardiac arrest back on August 16, 2018.  She was in ventricular fibrillation.  She received CPR at the home.  Paramedics arrived.  She had return of spontaneous circulation.  She had left heart catheterization that showed normal coronary arteries.  She then had a biventricular ICD placed.  She went home.  Then on telemedicine visit it was noted that her left upper extremity was markedly swollen.  She went in for an  urgent stat ultrasound which revealed extensive DVT.  She was admitted and hospitalized with Lovenox.  Her hemoglobin started dropping she became hypotensive and a rapid response was called.  She was transferred to ICU and underwent semiurgent EGD.  This revealed a nonbleeding ulcer in the duodenum.  This was clipped and cauterized.  She was then transferred out of the ICU.  Her cardiomyopathy medications were held due to hypotension.  Her blood pressures are now stable.    She denies any chest pain or shortness of breath.  She does have some mild fatigue.  She wants to eat.  No shortness of breath.  No lower extremity edema.    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 She received 3 shocks, 1 round of epi with successful ROSC. 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/2018    per patient, he only sees peripheral view    Diabetes mellitus 1992    Gout spondylitis     Gout synovitis  LBBB (left bundle branch block)     LBBB chronic    NICM (nonischemic cardiomyopathy) 08/2018    NICM, EF 20%    Respiratory failure 08/2018    Hypoxic/anoxic respiratory failure, extubated 08/20/2018    Type 2 diabetes mellitus, controlled     Viral cardiomyopathy 2008       Past Surgical History:     Past Surgical History:   Procedure Laterality Date    COLONOSCOPY  2002    normal    EXPLORATORY LAPAROTOMY      ICD IMPLANT BIV N/A 08/26/2018    Procedure: ICD IMPLANT BIV;  Surgeon: Len Childs, MD;  Location: [facility];  Service: Cardiovascular;  Laterality: N/A;  medtronic    LHC W/ CORONARY ANGIOS AND LV Left 08/21/2018    Procedure: LHC W/ CORONARY ANGIOS AND LV;  Surgeon: Burna Forts, MD;  Location: [facility];  Service: Cardiovascular;  Laterality: Left;       Family History:     Family  History   Problem Relation Age of Onset    Cancer Father 40        bladder       Social History:     Social History     Socioeconomic History    Marital status: Widowed     Spouse name: Not on file    Number of children: Not on file    Years of education: Not on file    Highest education level: Not on file   Occupational History    Not on file   Social Needs    Financial resource strain: Not on file    Food insecurity     Worry: Not on file     Inability: Not on file    Transportation needs     Medical: Not on file     Non-medical: Not on file   Tobacco Use    Smoking status: Never Smoker    Smokeless tobacco: Never Used   Substance and Sexual Activity    Alcohol use: Yes     Comment: social    Drug use: No    Sexual activity: Not on file   Lifestyle    Physical activity     Days per week: Not on file     Minutes per session: Not on file    Stress: Not on file   Relationships    Social connections     Talks on phone: Not on file     Gets together: Not on file     Attends religious service: Not on file     Active member of club or organization: Not on file     Attends meetings of clubs or organizations: Not on file     Relationship status: Not on file    Intimate partner violence     Fear of current or ex partner: Not on file     Emotionally abused: Not on file     Physically abused: Not on file     Forced sexual activity: Not on file   Other Topics Concern    Not on file   Social History Narrative    Not on file       Allergies:     Allergies   Allergen Reactions    Shellfish-Derived Products Anaphylaxis       Medications:     Medications Prior to Admission   Medication Sig    acetaminophen (TYLENOL) 325 MG tablet  Take 2 tablets (650 mg total) by mouth every 6 (six) hours as needed for Pain    atorvastatin (LIPITOR) 10 MG tablet Take 20 mg by mouth daily       carvedilol (COREG) 25 MG tablet Take 25 mg by mouth 2 (two) times daily. Morning and nights    metFORMIN (GLUCOPHAGE) 500 MG tablet  Take 1 tablet (500 mg total) by mouth 2 (two) times daily Morning and night    Multiple Vitamins-Minerals (CENTRUM PO) Take by mouth.      sacubitril-valsartan (Entresto) 49-51 MG Tab per tablet Take 1 tablet by mouth 2 (two) times daily    sitaGLIPtin (JANUVIA) 100 MG tablet Take 100 mg by mouth daily. At night     spironolactone (ALDACTONE) 25 MG tablet Take 1 tablet (25 mg total) by mouth daily    vitamin B-12 (CYANOCOBALAMIN) 100 MCG tablet Take 1,000 mcg by mouth daily         Office meds 08/30/18:   1. Januvia 100 mg Tablet, 1 po qd    2. Vitamin B-12 1,000 mcg Tablet, 1 po qd    3. Centrum Silver Tablet, 1 p.o. q.d.    4. atorvastatin 20 mg tablet, 1 po qd    5. Entresto 49 mg-51 mg tablet, 1 po bid    6. carvedilol 25 mg tablet, 1 po bid    7. metformin 500 mg tablet, 1 po bid    8. spironolactone 25 mg tablet, 1 po QD    9. Tylenol 325 mg capsule, 2 po PRN pain      Review of Systems:   Constitional, head, eyes, ears, nose, throat, musculoskeletal, skin, neuro, endo, psych, heme, all negative unless noted in HPI.    Physical Exam:   BP 137/63    Pulse 78    Temp 98.4 F (36.9 C) (Oral)    Resp (!) 23    Ht 1.651 m (5\' 5" )    Wt 61.3 kg (135 lb 2.3 oz)    SpO2 97%    BMI 22.49 kg/m   Temp (24hrs), Avg:98.2 F (36.8 C), Min:97.8 F (36.6 C), Max:98.4 F (36.9 C)      Intake and Output Summary (Last 24 hours) at Date Time    Intake/Output Summary (Last 24 hours) at 09/03/2018 0801  Last data filed at 09/03/2018 0600  Gross per 24 hour   Intake 230 ml   Output 630 ml   Net -400 ml       General Appearance:  Breathing comfortably, no acute distress  Head:  Normocephalic  Eyes:  Extraocular eye movements intact  Neck:  No carotid bruit or jugular venous distension, brisk carotid upstroke  Lungs:  Clear to auscultation throughout, no wheezes, rhonchi or rales, good respiratory effort   Chest Wall:  No tenderness or deformity  Heart:  S1, S2 normal, no S3, +S4, grade 1/6 holosystolic murmur, PMI displaced,  no rub   Abdomen:  Soft, non-tender, positive bowel sounds, no hepatojugular reflux  Extremities:  No cyanosis, clubbing or edema in lower extremities.  There is mild left upper extremity edema  Pulses:  Equal dorsalis pedis pulses, 4/4 symmetric  Neurologic:  Alert and oriented x3, mood and affect normal        Labs Reviewed:                         Recent Labs   Lab 09/01/18  0621   PT 14.7  PT INR 1.2*   PTT 33     Recent Labs   Lab 09/03/18  0414 09/02/18  1715 09/02/18  0406  09/01/18  0621   WBC 15.24*  --  15.69*  --  15.68*   Hgb 9.3* 9.9* 10.1*  More results in Results Review 8.8*   Hematocrit 28.3* 29.6* 30.0*  More results in Results Review 26.5*   Platelets 240  --  230  --  225   More results in Results Review = values in this interval not displayed.     Recent Labs   Lab 09/02/18  0406 09/01/18  0505 08/31/18  0752   Sodium 138 135* 135*   Potassium 4.8 4.2 4.4   Chloride 106 103 103   CO2 23 23 21*   BUN 47.0* 44.0* 37.0*   Creatinine 1.3* 1.3* 1.5*   EGFR 40.0 40.0 33.9   Glucose 147* 226* 218*   Calcium 8.7 8.9 9.1           Invalid input(s): FREET4    Lab Results   Component Value Date    BNP 139 (H) 08/16/2018                 Signed by: Jari Pigg, MD [facility]    [facility] [place] [facility]  NP Spectralink [phone] (8am-5pm)  MD Spectralink [phone]  (8am-5pm)  After hours, non urgent consult line [phone]  After Hours, urgent consults [phone]

## 2018-09-03 NOTE — Procedures (Signed)
PROCEDURE: MIDLINE INSERTION    Date of Insertion: 09/03/2018    INDICATIONS: IV Access -Heparin    NOTE: Left ICD(08/26/18) and LUE DVT possible related ICD. RT AC, huge bruises with PIV.     PROCEDURE DETAILS:     The midline procedure, risks, benefits were discussed with the Patient/Family.  All questions were answered. verbalized understanding and agreed to proceed. Midline education/instructions provided.    The patient was positioned and Ultrasound was used to confirm patency of the vein prior to obtaining venous access. The arm was scrubbed with 2% chlorhexidine per guidelines and a maximal sterile field was established for the patient.  The clinician was attired with cap, mask and sterile gown/gloves prior to start.     A sterile cover was sheathed to the Ultrasound probe. The vein was then revisualized and 1% lidocaine injected prior to puncture  with a 21-gauge single-wall needle under direct sonographic guidance. The guidewire was advanced through the needle, the needle was removed and a peel-away sheath was placed over the wire.   Midline was advanced through the peel-away sheath. The sheath was removed. Brisk  blood return. The catheter was flushed with normal saline and capped. The catheter was stabilized on the skin using a securement device. Gauze dressing - due to bleeding- applied using aseptic technique.     Patient did tolerate the procedure well. No complaints (# of attempts = 1)    Catheter Type: 4 Fr SL POWER ARROW MIDLINE   Insertion Site: RT Basilic vein  Total length: 15 cm  Internal Length: 15 cm  External Length: 0cm  UAC: 29cm    Midline Reference #: [id number]  Midline Kit Lot#:[id number]  Midline Kit expiration date: 2021    Findings/Conclusions:  No signs of bleeding or symptoms of nerve irritation noted at time of insertion procedure.  Tip location is at or below the level of the axilla.    Midline is ready for immediate use.  POWER injectable.    Renee Harder, RN. CRNI

## 2018-09-03 NOTE — Progress Notes (Signed)
[facility]- Medical Critical Care Service [facility])      INTERMEDIATE CARE PROGRESS NOTE    Date Time: 09/03/18 2:12 PM  Patient Name: Tara Perry  Attending Physician: Lianne Moris, MD;Al*  Primary Care Physician: Tammi Sou, MD  Location/Room: Z610/R604.54     Assessment:    Tara Perry is a 73 y.o. female with past medical history of NICM and DM who was recently discharged from [facility] following a VF cardiac arrest. She returned to [facility] ED two days later with new left upper extremity swelling secondary to acute subclavian DVT. IR was consulted and recommended therapeutic enoxaparin. She was transferred to the CICU after developing acute hematemesis. GI was consulted and she underwent EGD which showed a large duodenal ulcer with visible vessels which were cauterized and clipped. A repeat ultrasound of the DVT showed reduction in size with no indication for thrombectomy.     Problem List:   GI bleed  Duodenal ulcer s/p cauterization and clipping  Left subclavian DVT  VF arrest  NICM s/p ICD  DM II  Gout  Last 24 hours:    Transferred to [facility]     Hospital Course:    6/19: Admitted, started on therapeutic lovenox  6/21: Acute hematemesis, EGD with bleeding duodenal ulcer that was cauterized and clipped  6/22: Downgrade to [facility]     Plan:    NEURO: Critical care mobility protocol  - PT/OT, activity as tolerated    CARDIAC: NICM s/p ICD  - Continue statin  - Start coreg 3.125 daily  - Start entresto   - Per cardiology, start aldactone 12.5mg  daily tomorrow if electrolytes stable   - Cardiology ([facility]) following  - Blood pressure goal: MAP greater than 65    PULM: Protecting airway  - Supplemental O2 PRN to maintain SpO2 greater than 92%  - Encourage IS    GI: Duodenal ulcer s/p clipping and cauterization   - Monitor for signs of bleeding, frequent H/H  - Clear liquid diet, advance as tolerated  - GI prophylaxis: Transition protonix drip to BID dosing  - Nutrition: As above  - GI following     RENAL: BMP pending  - Monitor urine output  - Replace electrolytes as needed    ID: No indication for abx at this time, monitor    HEME: Anemia in the setting of GI bleeding  - Trend H/H q12 hours  - Monitor for signs of bleeding    Left subclavian DVT  - Start heparin gtt today  - Close monitoring for further bleeding  - SCDs  - Pharmacologic prophylaxis: As above    ENDO/RHEUM: DMII  - PRN SSI to maintain euglycemia   - Glucose goal: 100-180    LINES/FOLEY: PIV, remove foley    CODE STATUS: Full Code  Patient is currently able to make medical decisions.      Patients designated proxy is her son.    Discussed current diagnoses, prognosis and goals of care.     CONSULTANTS: GI, cardiology    DISPO: IMC  Review of Systems:   Review of Systems- Denies shortness of breath, pain, chest pain    Physical Exam:   Temp:  [97.8 F (36.6 C)-98.4 F (36.9 C)] 98.4 F (36.9 C)  Heart Rate:  [68-78] 78  Resp Rate:  [20-31] 23  BP: (107-139)/(54-73) 137/63    BP 137/63    Pulse 78    Temp 98.4 F (36.9 C) (Oral)    Resp Marland Kitchen)  23    Ht 1.651 m (5\' 5" )    Wt 61.3 kg (135 lb 2.3 oz)    SpO2 97%    BMI 22.49 kg/m      General Appearance:  alert, well appearing, and in no distress  Mental status:  alert, oriented to person, place, and time  Neuro:  alert, oriented, normal speech, no focal findings or movement disorder noted  HEENT: supple, no significant adenopathy  Pulmonary: clear to auscultation, no wheezes, rales or rhonchi, symmetric air entry  Cardiac: normal rate, regular rhythm, normal S1, S2, no murmurs, rubs, clicks or gallops  Abdomen:  soft, nontender, nondistended, no masses or organomegaly  Extremities: peripheral pulses normal, no pedal edema, no clubbing or cyanosis. LUE swollen    Skin:   normal coloration and turgor, no rashes, no suspicious skin lesions noted    Labs:     Results     Procedure Component Value Units Date/Time    Glucose Whole Blood - POCT [[id number]]  (Abnormal) Collected:  09/03/18 0737      Updated:  09/03/18 0744     POCT - Glucose Whole blood 189 mg/dL     CBC without differential [[id number]]  (Abnormal) Collected:  09/03/18 0414    Specimen:  Blood Updated:  09/03/18 0504     WBC 15.24 x10 3/uL      Hgb 9.3 g/dL      Hematocrit 96.2 %      Platelets 240 x10 3/uL      RBC 3.07 x10 6/uL      MCV 92.2 fL      MCH 30.3 pg      MCHC 32.9 g/dL      RDW 13 %      MPV 10.7 fL      Nucleated RBC 0.0 /100 WBC      Absolute NRBC 0.00 x10 3/uL     Glucose Whole Blood - POCT [[id number]]  (Abnormal) Collected:  09/02/18 2117     Updated:  09/02/18 2130     POCT - Glucose Whole blood 178 mg/dL     Hemoglobin and hematocrit, blood [[id number]]  (Abnormal) Collected:  09/02/18 1715    Specimen:  Blood Updated:  09/02/18 1731     Hgb 9.9 g/dL      Hematocrit 66.4 %     Glucose Whole Blood - POCT [[id number]]  (Abnormal) Collected:  09/02/18 1659     Updated:  09/02/18 1705     POCT - Glucose Whole blood 174 mg/dL     Glucose Whole Blood - POCT [[id number]]  (Abnormal) Collected:  09/02/18 1115     Updated:  09/02/18 1133     POCT - Glucose Whole blood 237 mg/dL           Radiology / Imaging:     US Venous Duplex Doppler Arm Left   Final Result      Extensive occlusive and nonocclusive left upper extremity DVT in the   region of left pacer wire, as detailed. Per discussion with clinical   team at the time of dictation, there are aware of this finding.      Janina Mayo, MD    09/01/2018 10:46 AM          I have spent 36 minutes providing care of this patient with the following problems: acute GI bleed, left subclavian DVT. >50% of that time was spent in counseling and coordination of care. Any care time performed today is exclusive  of teaching, billable procedures, and not overlapping with any other providers.    Above assessment and plan discussed and agreed upon with [facility] attending Dr. Melvia Heaps.    Signed by: Jeri Lager, ACNP-BC  09/03/2018 8:36 AM  [id number], Marlynn Perking, MD

## 2018-09-03 NOTE — Plan of Care (Signed)
Problem: Moderate/High Fall Risk Score >5  Goal: Patient will remain free of falls  Outcome: Progressing  Flowsheets (Taken 09/03/2018 0543)  Moderate Risk (6-13): MOD-Remain with patient during toileting     Problem: Safety  Goal: Patient will be free from injury during hospitalization  Outcome: Progressing  Flowsheets (Taken 09/03/2018 0543)  Patient will be free from injury during hospitalization:   Assess patient's risk for falls and implement fall prevention plan of care per policy   Provide and maintain safe environment   Use appropriate transfer methods   Hourly rounding     Problem: Compromised Tissue integrity  Goal: Damaged tissue is healing and protected  Outcome: Progressing  Flowsheets (Taken 09/03/2018 0543)  Damaged tissue is healing and protected:   Monitor/assess Braden scale every shift   Provide wound care per wound care algorithm   Increase activity as tolerated/progressive mobility   Relieve pressure to bony prominences for patients at moderate and high risk   Avoid shearing injuries   Keep intact skin clean and dry     Problem: Altered GI Function  Goal: Fluid and electrolyte balance are achieved/maintained  Outcome: Progressing  Flowsheets (Taken 09/03/2018 0543)  Fluid and electrolyte balance are achieved/maintained:   Monitor/assess lab values and report abnormal values   Assess and reassess fluid and electrolyte status   Monitor for muscle weakness  Goal: No bleeding  Outcome: Progressing  Flowsheets (Taken 09/03/2018 0543)  No bleeding:   Monitor and assess vitals and hemodynamic parameters   Monitor/assess lab values and report abnormal values  Note: Pt a&o, vss, denies pain, SR with BBB on tele, BP stable, LUE edema- elevated with pillow, maintain sats on RA, has been NPO overnight, no Bm/shift, voiding - foley cath, no s/sx of bleeding overnight, H&H stable, Protonix gtt at 8mg hr, will continue to monitor status per unit protocol and report significant changes.

## 2018-09-03 NOTE — Progress Notes (Signed)
Interventional Radiology  PROGRESS NOTE    Date Time: 09/03/18 2:22 PM  Patient Name: Tara Perry, Tara Perry      Assessment:   Ms. Vilar is a 73 y.o.femalewith recentcardiac arrest with AICD placement presenting with mild LUE swelling in setting of LUE DVT, likely due to recent lead placement. Repeat ultrasound shows extensive occlusive and nonocclusive LUE DVT in the region of the left pacer wire, significantly improved compared to FRC study dated 6/19. Course was complicated by new UGIB with drop in H/H s/p endoscopic clipping of two non bleeding duodenal ulcers 6/21. Lovenox discontinued 6/21 AM in light of UGIB. Moderate intensity heparin gtt started 6/23.    Hgb 9.3 --> 9.1  LUE swelling overall decreasing    Plan:   - Continue AC per primary team. Monitor H/H  - Outpatient follow up with Dr. Kathi Ludwig with repeat LUE venous ultrasound scheduled 7/30, info in d/c nav.    Subjective:   Denies any further episodes of hematemesis.     Medications:     Current Facility-Administered Medications   Medication Dose Route Frequency    atorvastatin  20 mg Oral Daily    carvedilol  3.125 mg Oral Q12H SCH    insulin lispro  1-3 Units Subcutaneous QHS    insulin lispro  1-5 Units Subcutaneous TID AC    pantoprazole  40 mg Intravenous BID    sacubitril-valsartan  24-26 mg Oral Q12H SCH    vitamin B-12  1,000 mcg Oral Daily         Physical Exam:     Vitals:    09/03/18 1200   BP: 129/60   Pulse: 71   Resp: (!) 25   Temp: 98.2 F (36.8 C)   SpO2: 96%     GEN: WD/WN  female, NAD  HEENT: atraumatic, anicteric, neck supple  LUNGS: nonlabored breathing  EXTREMITIES: left arm with 1+ edema from the hand to shoulder (improved). Warm, motor and sensation grossly intact. Palpable radial and ulnar pulses.     I & O     Intake and Output Summary (Last 24 hours) at Date Time    Intake/Output Summary (Last 24 hours) at 09/03/2018 1422  Last data filed at 09/03/2018 1000  Gross per 24 hour   Intake 160 ml   Output 950 ml   Net -790 ml          Labs:     Lab Results   Component Value Date/Time    WBC 15.24 (H) 09/03/2018 04:14 AM    WBC 14.1 (H) 08/03/2006 05:00 AM    HCT 26.6 (L) 09/03/2018 12:59 PM    PLT 240 09/03/2018 04:14 AM    INR 1.2 (H) 09/01/2018 06:21 AM    PT 14.7 09/01/2018 06:21 AM    PTT 33 09/01/2018 06:21 AM    BUN 37.0 (H) 09/03/2018 12:59 PM    CREAT 1.1 (H) 09/03/2018 12:59 PM    GLU 165 (H) 09/03/2018 12:59 PM    K 4.3 09/03/2018 12:59 PM        Rads:     Radiology Results (24 Hour)     ** No results found for the last 24 hours. **           Lorenso Quarry, PA-C  Interventional Radiology  [facility]  Spectralink/Inpatient consults 7:30am-5pm Monday-Friday: [phone]   After hours office ph: [phone], fax: [phone]

## 2018-09-04 ENCOUNTER — Encounter: Payer: Self-pay | Admitting: Acute Care

## 2018-09-04 DIAGNOSIS — K280 Acute gastrojejunal ulcer with hemorrhage: Secondary | ICD-10-CM

## 2018-09-04 LAB — HEMOGLOBIN AND HEMATOCRIT, BLOOD
Hematocrit: 24.2 % — ABNORMAL LOW (ref 34.7–43.7)
Hgb: 8.3 g/dL — ABNORMAL LOW (ref 11.4–14.8)

## 2018-09-04 LAB — APTT
PTT: 161 s (ref 23–37)
PTT: 91 s — ABNORMAL HIGH (ref 23–37)
PTT: 77 s — ABNORMAL HIGH (ref 23–37)

## 2018-09-04 LAB — CBC
Absolute NRBC: 0 10*3/uL (ref 0.00–0.00)
Hematocrit: 25.1 % — ABNORMAL LOW (ref 34.7–43.7)
Hgb: 8.3 g/dL — ABNORMAL LOW (ref 11.4–14.8)
MCH: 30 pg (ref 25.1–33.5)
MCHC: 33.1 g/dL (ref 31.5–35.8)
MCV: 90.6 fL (ref 78.0–96.0)
MPV: 10.6 fL (ref 8.9–12.5)
Nucleated RBC: 0 /100 WBC (ref 0.0–0.0)
Platelets: 230 10*3/uL (ref 142–346)
RBC: 2.77 10*6/uL — ABNORMAL LOW (ref 3.90–5.10)
RDW: 13 % (ref 11–15)
WBC: 9.89 10*3/uL — ABNORMAL HIGH (ref 3.10–9.50)

## 2018-09-04 LAB — BASIC METABOLIC PANEL
Anion Gap: 9 (ref 5.0–15.0)
BUN: 31 mg/dL — ABNORMAL HIGH (ref 7.0–19.0)
CO2: 21 mEq/L — ABNORMAL LOW (ref 22–29)
Calcium: 7.8 mg/dL — ABNORMAL LOW (ref 7.9–10.2)
Chloride: 103 mEq/L (ref 100–111)
Creatinine: 1.1 mg/dL — ABNORMAL HIGH (ref 0.6–1.0)
Glucose: 194 mg/dL — ABNORMAL HIGH (ref 70–100)
Potassium: 3.8 mEq/L (ref 3.5–5.1)
Sodium: 133 mEq/L — ABNORMAL LOW (ref 136–145)

## 2018-09-04 LAB — GLUCOSE WHOLE BLOOD - POCT
Whole Blood Glucose POCT: 196 mg/dL — ABNORMAL HIGH (ref 70–100)
Whole Blood Glucose POCT: 269 mg/dL — ABNORMAL HIGH (ref 70–100)
Whole Blood Glucose POCT: 208 mg/dL — ABNORMAL HIGH (ref 70–100)
Whole Blood Glucose POCT: 234 mg/dL — ABNORMAL HIGH (ref 70–100)

## 2018-09-04 LAB — GFR: EGFR: 48.6

## 2018-09-04 MED ORDER — SPIRONOLACTONE 25 MG PO TABS
12.5000 mg | ORAL_TABLET | Freq: Every day | ORAL | Status: DC
Start: 2018-09-04 — End: 2018-09-05
  Administered 2018-09-04 – 2018-09-05 (×2): 12.5 mg via ORAL
  Filled 2018-09-04 (×2): qty 1

## 2018-09-04 MED ORDER — ATORVASTATIN CALCIUM 40 MG PO TABS
40.0000 mg | ORAL_TABLET | Freq: Every day | ORAL | Status: DC
Start: 2018-09-05 — End: 2018-09-08
  Administered 2018-09-05 – 2018-09-08 (×4): 40 mg via ORAL
  Filled 2018-09-04 (×4): qty 1

## 2018-09-04 NOTE — Progress Notes (Signed)
[facility]- Medical Critical Care Service [facility])      INTERMEDIATE CARE PROGRESS NOTE    Date Time: 09/04/18 8:14 AM  Patient Name: Tara Perry  Attending Physician: Al-Ghandour, Roni Bread, MD  Primary Care Physician: Tammi Sou, MD  Location/Room: R518/A416.60     Assessment:    Tara Perry is a 73 y.o. female with past medical history of NICM and DM who was recently discharged from [facility] following a VF cardiac arrest. She returned to [facility] ED two days later with new left upper extremity swelling secondary to acute subclavian DVT. IR was consulted and recommended therapeutic enoxaparin. She was transferred to the CICU after developing acute hematemesis. GI was consulted and she underwent EGD which showed a large duodenal ulcer with visible vessels which were cauterized and clipped. A repeat ultrasound of the DVT showed reduction in size with no indication for thrombectomy.     Problem List:   GI bleed  Duodenal ulcer s/p cauterization and clipping  Left subclavian DVT  VF arrest  NICM s/p ICD  DM II  Gout    Last 24 hours:    No acute events.  Some continued chest soreness.      Hospital Course:    6/19: Admitted, started on therapeutic lovenox  6/21: Acute hematemesis, EGD with bleeding duodenal ulcer that was cauterized and clipped  6/22: Downgraded to [facility]     Plan:    NEURO: Critical care mobility protocol  - PT/OT, activity as tolerated    CARDIAC: NICM s/p ICD  - Continue statin  - Start coreg 3.125 daily  - Start entresto   - Per cardiology, start aldactone 12.5mg  daily tomorrow if electrolytes stable   - Cardiology ([facility]) following  - Blood pressure goal: MAP greater than 65    PULM: Protecting airway  - Supplemental O2 PRN to maintain SpO2 greater than 92%  - Encourage IS    GI: Duodenal ulcer s/p clipping and cauterization   - Monitor for signs of bleeding, frequent H/H  - consistent carb diet.   - GI prophylaxis: protonix IV BID   - Nutrition: As above  - GI following    RENAL:  BMP pending  - Monitor urine output  - Replace electrolytes as needed  - resume aldactone 12.5 mg qd     ID: No indication for abx at this time, monitor    HEME: Anemia in the setting of GI bleeding  - Trend H/H q12 hours  - Monitor for signs of bleeding  - Consult heme for transition to oral anticoagulation      Left subclavian DVT  - continue heparin gtt  - Close monitoring for further bleeding  - SCDs  - Pharmacologic prophylaxis: As above    ENDO/RHEUM: DMII  - PRN SSI to maintain euglycemia   - Glucose goal: 100-180    LINES/FOLEY: PIV, remove foley    CODE STATUS: Full Code    Patient is currently able to make medical decisions.      Patients designated proxy is her son.    Discussed current diagnoses, prognosis and goals of care.     CONSULTANTS: GI, cardiology    DISPO: [facility]    Physical Exam:   Temp:  [97.2 F (36.2 C)-98.6 F (37 C)] 97.8 F (36.6 C)  Heart Rate:  [60-74] 64  Resp Rate:  [18-34] 20  BP: (113-167)/(53-72) 118/56    BP 118/56    Pulse 64    Temp 97.8  F (36.6 C) (Oral)    Resp 20    Ht 1.651 m (5\' 5" )    Wt 65.6 kg (144 lb 10 oz)    SpO2 95%    BMI 24.07 kg/m      General Appearance:  alert, well appearing, and in no distress  Mental status:  alert, oriented to person, place, and time  Neuro:  alert, oriented, normal speech, no focal findings or movement disorder noted  HEENT: supple, no significant adenopathy  Pulmonary: clear to auscultation, no wheezes, rales or rhonchi, symmetric air entry  Cardiac: normal rate, regular rhythm, normal S1, S2, no murmurs, rubs, clicks or gallops  Abdomen:  soft, nontender, nondistended, no masses or organomegaly  Extremities: peripheral pulses normal, no pedal edema, L>R extremity swelling, ecchymosis R antecubital space.  Skin:   normal coloration and turgor, no rashes, no suspicious skin lesions noted    Labs:     Results     Procedure Component Value Units Date/Time    Glucose Whole Blood - POCT [[id number]]  (Abnormal) Collected:  09/04/18  0750     Updated:  09/04/18 0753     Whole Blood Glucose POCT 196 mg/dL     APTT [[id number]]  (Abnormal) Collected:  09/04/18 0353     Updated:  09/04/18 0442     PTT 161 sec     Narrative:       Obtain baseline aPTT prior to heparin initiation if not drawn  previously. Do not wait for aPTT result prior to heparin  initiation. When therapeutic range is reached per protocol,  change aPTT frequency to daily at [facility] daily at 0400 until  heparin is discontinued, except for ICU patients - continue  q8h aPTTs    Basic Metabolic Panel [[id number]]  (Abnormal) Collected:  09/04/18 0353    Specimen:  Blood Updated:  09/04/18 0438     Glucose 194 mg/dL      BUN 62.9 mg/dL      Creatinine 1.1 mg/dL      Calcium 7.8 mg/dL      Sodium 528 mEq/L      Potassium 3.8 mEq/L      Chloride 103 mEq/L      CO2 21 mEq/L      Anion Gap 9.0    GFR [[id number]] Collected:  09/04/18 0353     Updated:  09/04/18 0438     EGFR 48.6    CBC without differential [[id number]]  (Abnormal) Collected:  09/04/18 0353    Specimen:  Blood Updated:  09/04/18 0417     Hgb 8.3 g/dL      Hematocrit 03.4 %      WBC 9.89 x10 3/uL      Platelets 230 x10 3/uL      RBC 2.77 x10 6/uL      MCV 90.6 fL      MCH 30.0 pg      MCHC 33.1 g/dL      RDW 13 %      MPV 10.6 fL      Nucleated RBC 0.0 /100 WBC      Absolute NRBC 0.00 x10 3/uL     Glucose Whole Blood - POCT [[id number]]  (Abnormal) Collected:  09/03/18 2256     Updated:  09/03/18 2335     Whole Blood Glucose POCT 178 mg/dL     Glucose Whole Blood - POCT [[id number]]  (Abnormal) Collected:  09/03/18 2132     Updated:  09/03/18 2141  Whole Blood Glucose POCT 196 mg/dL     APTT [[id number]]  (Abnormal) Collected:  09/03/18 1950     Updated:  09/03/18 2114     PTT 124 sec     Narrative:       Obtain baseline aPTT prior to heparin initiation if not drawn  previously. Do not wait for aPTT result prior to heparin  initiation. When therapeutic range is reached per protocol,  change aPTT frequency to daily at [facility] daily at  0400 until  heparin is discontinued, except for ICU patients - continue  q8h aPTTs    Glucose Whole Blood - POCT [[id number]]  (Abnormal) Collected:  09/03/18 1538     Updated:  09/03/18 1541     Whole Blood Glucose POCT 159 mg/dL     GFR [[id number]] Collected:  09/03/18 1259     Updated:  09/03/18 1329     EGFR 48.6    Basic Metabolic Panel [[id number]]  (Abnormal) Collected:  09/03/18 1259    Specimen:  Blood Updated:  09/03/18 1329     Glucose 165 mg/dL      BUN 62.9 mg/dL      Creatinine 1.1 mg/dL      Calcium 8.0 mg/dL      Sodium 528 mEq/L      Potassium 4.3 mEq/L      Chloride 105 mEq/L      CO2 20 mEq/L      Anion Gap 10.0    Hemoglobin and hematocrit, blood [[id number]]  (Abnormal) Collected:  09/03/18 1259    Specimen:  Blood Updated:  09/03/18 1321     Hgb 9.1 g/dL      Hematocrit 27.2 %     Glucose Whole Blood - POCT [[id number]]  (Abnormal) Collected:  09/03/18 1207     Updated:  09/03/18 1210     Whole Blood Glucose POCT 182 mg/dL           Radiology / Imaging:     US Venous Duplex Doppler Arm Left   Final Result      Extensive occlusive and nonocclusive left upper extremity DVT in the   region of left pacer wire, as detailed. Per discussion with clinical   team at the time of dictation, there are aware of this finding.      Janina Mayo, MD    09/01/2018 10:46 AM          I have spent 35 minutes providing care of this patient. >50% of that time was spent in counseling and coordination of care. Any care time performed today is exclusive of teaching, billable procedures, and not overlapping with any other providers.    Plan of care d/w Dr. Melvia Heaps, Roni Bread, MD.    Signed by: Jacklyn Shell ACNP-BC  09/04/2018 8:14 AM  [id number], Marlynn Perking, MD

## 2018-09-04 NOTE — Consults (Signed)
HEMATOLOGY    CONSULT NOTE         Date Time: 09/04/18 1:28 PM  Patient Name: Tara Perry, Tara Perry  Requesting Physician: Al-Ghandour, Roni Bread, MD       Reason for Consultation:   [facility] recommendations    Assessment:   LUE DVT with extensive occlusive and nonocclusive left upper extremity DVT in the region of left pacer wire    UGIB with hematemesis on therapeutic AC, GI consulted with EGD completed 6/21 revealing large duodenal ulcer with visible vessels s/p cauterization/clipping. Cleared to resume AC per GI rescs    Recommendations:   Agree with heparin gtt with continued close monitoring  Trend CBC, transfuse for H/H <7  Recommend transition to therapeutic Lovenox for discharge planning with goal to transition DOAC as outpt with stable H/H.  We will follow     Case reviewed with Dr Zollie Pee    Attending: I have seen, examined, and discussed the patient with Armida Sans, PA, and agree with documentation.  Patient with major bleeding event while on anticoagulation for provoked event, ulcer s/p cautery/clipping. Discussed maintaining on Lovenox until we have given time for ulcer to heal at which point can switch to DOAC (preferably Eliquis). Patient will think about this as she is worried about taking Lovenox / injections and notify me tomorrow of her preference.   Tyrone Sage, MD    History:   Tara Perry is a 73 y.o. female with PMH of NICM and DM recently discharged from [facility] following VF cardiac arrest who presents to the hospital on 08/30/2018 with left arm swelling found to have acute subclavian DVT. IR was consulted and pt was started on therapeutic Lovenox. Hospital course complicated by episode of hematemesis on 6/21. EGD completed revealed large duodenal ulcer with visible vessels s/p cauterization/clipping. Pt denies prior history of VTE, no known family history of clotting or bleeding disorders. Hematology consulted regarding [facility] recommendations.     Past Medical History:     Past Medical History:    Diagnosis Date    Atrophic vaginitis 08/30/2018    Cardiac arrest 08/17/2018    VF arrest. She received 3 shocks, 1 round of epi with successful ROSC. 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/2018    per patient, he only sees peripheral view    Diabetes mellitus 1992    Gout spondylitis     Gout synovitis     LBBB (left bundle branch block)     LBBB chronic    NICM (nonischemic cardiomyopathy) 08/2018    NICM, EF 20%    Respiratory failure 08/2018    Hypoxic/anoxic respiratory failure, extubated 08/20/2018    Type 2 diabetes mellitus, controlled     Viral cardiomyopathy 2008       Past Surgical History:     Past Surgical History:   Procedure Laterality Date    COLONOSCOPY  2002    normal    EGD N/A 09/01/2018    Procedure: EGD;  Surgeon: Lilia Pro, MD;  Location: [facility];  Service: Gastroenterology;  Laterality: N/A;    EXPLORATORY LAPAROTOMY      ICD IMPLANT BIV N/A 08/26/2018    Procedure: ICD IMPLANT BIV;  Surgeon: Len Childs, MD;  Location: [facility];  Service: Cardiovascular;  Laterality: N/A;  medtronic    LHC W/ CORONARY ANGIOS AND  LV Left 08/21/2018    Procedure: LHC W/ CORONARY ANGIOS AND LV;  Surgeon: Burna Forts, MD;  Location: [facility];  Service: Cardiovascular;  Laterality: Left;       Family History:     Family History   Problem Relation Age of Onset    Cancer Father 30        bladder       Social History:     Social History     Socioeconomic History    Marital status: Widowed     Spouse name: Not on file    Number of children: Not on file    Years of education: Not on file    Highest education level: Not on file   Occupational History    Not on file   Social Needs    Financial resource strain: Not on file    Food insecurity     Worry: Not on file     Inability: Not on file    Transportation needs     Medical: Not on file     Non-medical:  Not on file   Tobacco Use    Smoking status: Never Smoker    Smokeless tobacco: Never Used   Substance and Sexual Activity    Alcohol use: Yes     Comment: social    Drug use: No    Sexual activity: Not on file   Lifestyle    Physical activity     Days per week: Not on file     Minutes per session: Not on file    Stress: Not on file   Relationships    Social connections     Talks on phone: Not on file     Gets together: Not on file     Attends religious service: Not on file     Active member of club or organization: Not on file     Attends meetings of clubs or organizations: Not on file     Relationship status: Not on file    Intimate partner violence     Fear of current or ex partner: Not on file     Emotionally abused: Not on file     Physically abused: Not on file     Forced sexual activity: Not on file   Other Topics Concern    Not on file   Social History Narrative    Not on file       Allergies:     Allergies   Allergen Reactions    Shellfish-Derived Products Anaphylaxis       Medications:     Current Facility-Administered Medications   Medication Dose Route Frequency    [START ON 09/05/2018] atorvastatin  40 mg Oral Daily    carvedilol  3.125 mg Oral Q12H SCH    insulin lispro  1-3 Units Subcutaneous QHS    insulin lispro  1-5 Units Subcutaneous TID AC    pantoprazole  40 mg Intravenous BID    sacubitril-valsartan  24-26 mg Oral Q12H SCH    spironolactone  12.5 mg Oral Daily    vitamin B-12  1,000 mcg Oral Daily       Review of Systems:   HEMATOLOGY FOCUSED ROS:    General  no fever, chills, night sweats, generalized weakness or fatigue.   Heme: No bleeding, bruising, rash, or swelling in the neck, underarms or groin.  No pain or swelling in upper or lower extremities.  All other systems were  reviewed and were negative except as indicated in the HPI.      Physical Exam:   Temp (24hrs), Avg:98.1 F (36.7 C), Min:97.2 F (36.2 C), Max:98.6 F (37 C)    Vitals:    09/04/18 1200   BP: 118/59    Pulse: 75   Resp: (!) 25   Temp: 97.7 F (36.5 C)   SpO2: 98%       General appearance -  well appearing, and in no distress   Mental status - alert, oriented to person, place, and time   Eyes - sclerae anicteric, slight conjunctival pallor   Mouth - mucous membranes moist   Chest - clear to auscultation  Heart - normal rate, regular rhythm, without murmur   Abdomen - soft, nontender, nondistended, no masses or organomegaly   Extremities - RLE edema, improving   Skin - without ecchymoses, petechiae or rash  Neurologic- without localizing deficit      Labs Reviewed:     Results     Procedure Component Value Units Date/Time    APTT [[id number]]  (Abnormal) Collected:  09/04/18 1154     Updated:  09/04/18 1245     PTT 91 sec     Narrative:       Obtain baseline aPTT prior to heparin initiation if not drawn  previously. Do not wait for aPTT result prior to heparin  initiation. When therapeutic range is reached per protocol,  change aPTT frequency to daily at [facility] daily at 0400 until  heparin is discontinued, except for ICU patients - continue  q8h aPTTs    Glucose Whole Blood - POCT [[id number]]  (Abnormal) Collected:  09/04/18 1112     Updated:  09/04/18 1137     Whole Blood Glucose POCT 269 mg/dL     Glucose Whole Blood - POCT [[id number]]  (Abnormal) Collected:  09/04/18 0750     Updated:  09/04/18 0753     Whole Blood Glucose POCT 196 mg/dL     APTT [[id number]]  (Abnormal) Collected:  09/04/18 0353     Updated:  09/04/18 0442     PTT 161 sec     Narrative:       Obtain baseline aPTT prior to heparin initiation if not drawn  previously. Do not wait for aPTT result prior to heparin  initiation. When therapeutic range is reached per protocol,  change aPTT frequency to daily at [facility] daily at 0400 until  heparin is discontinued, except for ICU patients - continue  q8h aPTTs    Basic Metabolic Panel [[id number]]  (Abnormal) Collected:  09/04/18 0353    Specimen:  Blood Updated:  09/04/18 0438     Glucose 194 mg/dL      BUN  02.7 mg/dL      Creatinine 1.1 mg/dL      Calcium 7.8 mg/dL      Sodium 253 mEq/L      Potassium 3.8 mEq/L      Chloride 103 mEq/L      CO2 21 mEq/L      Anion Gap 9.0    GFR [[id number]] Collected:  09/04/18 0353     Updated:  09/04/18 0438     EGFR 48.6    CBC without differential [[id number]]  (Abnormal) Collected:  09/04/18 0353    Specimen:  Blood Updated:  09/04/18 0417     Hgb 8.3 g/dL      Hematocrit 64.3 %      WBC 9.89 x10 3/uL  Platelets 230 x10 3/uL      RBC 2.77 x10 6/uL      MCV 90.6 fL      MCH 30.0 pg      MCHC 33.1 g/dL      RDW 13 %      MPV 10.6 fL      Nucleated RBC 0.0 /100 WBC      Absolute NRBC 0.00 x10 3/uL     Glucose Whole Blood - POCT [[id number]]  (Abnormal) Collected:  09/03/18 2256     Updated:  09/03/18 2335     Whole Blood Glucose POCT 178 mg/dL     Glucose Whole Blood - POCT [[id number]]  (Abnormal) Collected:  09/03/18 2132     Updated:  09/03/18 2141     Whole Blood Glucose POCT 196 mg/dL     APTT [[id number]]  (Abnormal) Collected:  09/03/18 1950     Updated:  09/03/18 2114     PTT 124 sec     Narrative:       Obtain baseline aPTT prior to heparin initiation if not drawn  previously. Do not wait for aPTT result prior to heparin  initiation. When therapeutic range is reached per protocol,  change aPTT frequency to daily at [facility] daily at 0400 until  heparin is discontinued, except for ICU patients - continue  q8h aPTTs    Glucose Whole Blood - POCT [[id number]]  (Abnormal) Collected:  09/03/18 1538     Updated:  09/03/18 1541     Whole Blood Glucose POCT 159 mg/dL     GFR [[id number]] Collected:  09/03/18 1259     Updated:  09/03/18 1329     EGFR 48.6    Basic Metabolic Panel [[id number]]  (Abnormal) Collected:  09/03/18 1259    Specimen:  Blood Updated:  09/03/18 1329     Glucose 165 mg/dL      BUN 47.4 mg/dL      Creatinine 1.1 mg/dL      Calcium 8.0 mg/dL      Sodium 259 mEq/L      Potassium 4.3 mEq/L      Chloride 105 mEq/L      CO2 20 mEq/L      Anion Gap 10.0          Rads:      Radiology Results (24 Hour)     ** No results found for the last 24 hours. **          Signed by:     Armida Sans PA-C  HEMATOLOGY

## 2018-09-04 NOTE — Progress Notes (Signed)
Care Plan Notes    [facility] Nursing Progress Note    Tara Perry is a 72 y.o. female  Admitted 08/30/2018  5:23 PM [facility] day 5)    Indication for continued [facility] status:  Heparin gtt    Mews Score: 1      Major Shift Events:  Titrated Heparin gtt    Review of Systems  Neuro:  Aox4  MAE  FC  Pt reported soreness in chest from previous CPR on 6/6    Cardiac:  NSR w/ BBB 60s-70s  Pt has AICD   Nonpitting edema in LUE due to DVT    Respiratory:  Room air  Clear lung sounds    GI/GU:  Consistent carb diet  Abd soft, nondistended, nontender    AUO     BM this shift? no    Skin Assessment  Skin Integrity: Bruising  Bruising Skin Location: BUE    Braden Score: Braden Scale Score: 14 (09/03/18 2000)    Patient admitted/transferred to this unit this shift? no    LDAWs  Patient Lines/Drains/Airways Status    Active Lines, Drains and Airways     Name:   Placement date:   Placement time:   Site:   Days:    Peripheral IV 09/01/18 Anterior;Right Hand   09/01/18    --    Hand   3    Midline IV 09/03/18 Anterior;Right Upper Arm   09/03/18    1457    Upper Arm   less than 1               Wound 08/26/18 Surgical Incision Chest Left BIV ICD IMPLANT (Active)   Date First Assessed/Time First Assessed: 08/26/18 1401   Wound Type: Surgical Incision  Location: Chest  Wound Location Orientation: Left  Wound Description (Comments): BIV ICD IMPLANT  Present on Admission: No      No assessment data to display       No Linked orders to display       Wound 09/02/18 Pressure Injury Stage 1 Coccyx (Active)   Date First Assessed/Time First Assessed: 09/02/18 2000   Wound Type: Pressure Injury  Pressure Injury Staging (WOCN/ Trained RNs Only): Stage 1  Location: Coccyx      Assessments 09/02/2018  8:00 PM 09/03/2018  4:00 AM   Peri-wound Description Clean;Dry;Intact Clean;Dry;Intact   Treatments Cleansed Other (Comment)       No Linked orders to display       Psycho/Social:  Calm and cooperative    Interpreter Services:  Does the patient require an  Interpreter? no

## 2018-09-04 NOTE — PT Eval Note (Signed)
[facility]   Physical Therapy Evaluation   [name]    MRN#: [id number]   Unit: [facility] TOWER 4  Bed: V409/W119.14      Post Acute Care Therapy Recommendations:   Discharge Recommendations: Home with home health PT, Home with supervision     Milestones to be reached to achieve recommendation: improve mobility  Anticipate achievement in 2-3 sessions    DME Recommended for Discharge: (pt owns walker)    If Home with home health PT, Home with supervision recommended discharge disposition is not available, patient will need rehab.    Therapy discharge recommendations may change with patient status.  Please refer to most recent note for up-to-date recommendations.    Assessment:   Significant Findings: none    Tara Perry is a 73 y.o. female admitted 08/30/2018.  Patient presents with decreased functional activity tolerance and impaired gait stability requiring min assist and use of FWW for functional transfers and ambulation which is a change from baseline independence. Patient tolerated session well and is able to demonstrate good awareness overall with mobility. Patient has excellent support at home and her sister is currently living with her. She will benefit from continued skilled rehab to maximize functional gains and progress overall safety with mobility.    Impairments: Assessment: Decreased LE strength;Decreased endurance/activity tolerance;Decreased functional mobility;Decreased balance;Gait impairment.     Therapy Diagnosis: Impaired mobility    Rehabilitation Potential: Prognosis: Good;With home health nursing/aide    Treatment Activities: PT eval, gait    Educated the patient to role of physical therapy, plan of care, and goals of therapy.    Plan:   Treatment/Interventions: Exercise, Gait training, Neuromuscular re-education, Functional transfer training, LE strengthening/ROM, Endurance training, Stair training     PT Frequency: 3-4x/wk   Risks/Benefits/POC  Discussed with Pt/Family: With patient        Precautions and Contraindications:   Weight Bearing Status: no restrictions  Other Precautions: Falls    Consult received for Tara Perry for PT Evaluation and Treatment.  Patients medical condition is appropriate for Physical therapy intervention at this time.    Medical Diagnosis: Arm DVT (deep venous thromboembolism), acute, left [I82.622]      History of Present Illness:   Tara Perry is a 73 y.o. female admitted on 08/30/2018 after recent admit s/p vfib arrest and AICD placement; now admitted for LUE swelling, found to have subclavian DVT. Also with hemestasis s/p EGD with embolization and clipping.    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 She received 3 shocks, 1 round of epi with successful ROSC. 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/2018    per patient, he only sees peripheral view    Diabetes mellitus 1992    Gout spondylitis     Gout synovitis     LBBB (left bundle branch block)     LBBB chronic    NICM (nonischemic cardiomyopathy) 08/2018    NICM, EF 20%    Respiratory failure 08/2018    Hypoxic/anoxic respiratory failure, extubated 08/20/2018    Type 2 diabetes mellitus, controlled     Viral cardiomyopathy 2008       X-Rays/Tests/Labs:  US Venous Duplex  Doppler Arm Left    Result Date: 09/01/2018  Extensive occlusive and nonocclusive left upper extremity DVT in the region of left pacer wire, as detailed. Per discussion with clinical team at the time of dictation, there are aware of this finding. 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 Nursing clears patient for therapy.     Patient Goal: To go home    Pain Assessment  Pain Assessment: Numeric Scale (0-10)  Pain Score: 4-moderate pain  Pain Location: Chest(from compressions)    Objective:   Observation of Patient/Vital Signs:  Patient is recumbent in bed with MCCSU monitoring, heparin gtt in place.     Observation of Patient/Vital signs:  VSS throughout session, on room air.  BP 105/63 seated in chair    Inspection/Posture: Generally WNL    Cognition/Neuro Status  Arousal/Alertness: Appropriate responses to stimuli  Attention Span: Appears intact  Orientation Level: Oriented X4  Following Commands: Follows all commands and directions without difficulty  Safety Awareness: minimal verbal instruction  Behavior: calm;cooperative  Motor Planning: intact  Coordination: intact    Musculoskeletal Examination:  Gross ROM  Right Upper Extremity ROM: within functional limits  Left Upper Extremity ROM: within functional limits  Right Lower Extremity ROM: within functional limits  Left Lower Extremity ROM: within functional limits    Gross Strength  Right Lower Extremity Strength: 4/5  Left Lower Extremity Strength: 4/5    Functional Mobility:  Supine to Sit: Contact Guard Assist;HOB raised;Increased Time  Sit to Stand: Contact Guard Assist(to FWW)    Ambulation:  PMP - Progressive Mobility Protocol   PMP Activity: Step 5 - Chair  Distance Walked (ft) (Step 6,7): 35 Feet   Ambulation: Contact Guard  Assist;with front-wheeled walker  Pattern: shuffle;R flexed knee;L flexed knee;Step through     Balance:  Sitting - Static: Good  Sitting - Dynamic: Good  Standing - Static: Fair  Standing - Dynamic: Fair      Participation and Activity Tolerance:  Participation Effort: good  Endurance: Tolerates 10 - 20 min exercise with multiple rests      Patient left with call bell within reach, all needs met, SCDs off, fall mat down, bed alarm na, chair alarm on, all lines/tubes as found and all questions answered. RN notified of session outcome and patient response.       Goals:   Goals  Goal Formulation: With patient  Time for Goal Acheivement: 3 visits  Goals: Select goal  Pt Will Go Supine To Sit: modified independent  Pt Will Perform Sit to Stand: with supervision  Pt Will Transfer Bed/Chair: with rolling walker, with supervision  Pt Will Ambulate: 151-200 feet, with rolling walker, with supervision  Pt Will Go Up / Down  Stairs: 3-5 stairs, with supervision, With rail       Time of treatment:   PT Received On: 09/04/18  Start Time: 1025  Stop Time: 1100  Time Calculation (min): 35 min      Duard Brady, PT, DPT  Pager number: [phone]

## 2018-09-04 NOTE — Plan of Care (Signed)
Problem: Moderate/High Fall Risk Score >5  Goal: Patient will remain free of falls  09/04/2018 0142 by Joelyn Oms, RN  Outcome: Progressing  Flowsheets (Taken 09/03/2018 2000)  Moderate Risk (6-13):   MOD-Floor mat at bedside (where available) if appropriate   MOD-Remain with patient during toileting   MOD-Place bedside commode and assistive devices out of sight when not in use   MOD-Utilize diversion activities   MOD-Perform dangle, stand, walk (DSW) when getting patient up  09/04/2018 0142 by Joelyn Oms, RN  Outcome: Progressing  Flowsheets (Taken 09/03/2018 2000)  Moderate Risk (6-13):   MOD-Floor mat at bedside (where available) if appropriate   MOD-Remain with patient during toileting   MOD-Place bedside commode and assistive devices out of sight when not in use   MOD-Utilize diversion activities   MOD-Perform dangle, stand, walk (DSW) when getting patient up     Problem: Safety  Goal: Patient will be free from injury during hospitalization  09/04/2018 0142 by Joelyn Oms, RN  Outcome: Progressing  Flowsheets (Taken 09/04/2018 0142)  Patient will be free from injury during hospitalization:   Assess patient's risk for falls and implement fall prevention plan of care per policy   Provide and maintain safe environment   Use appropriate transfer methods   Ensure appropriate safety devices are available at the bedside   Assess for patients risk for elopement and implement Elopement Risk Plan per policy   Hourly rounding   Include patient/ family/ care giver in decisions related to safety  09/04/2018 0142 by Joelyn Oms, RN  Outcome: Progressing  Goal: Patient will be free from infection during hospitalization  09/04/2018 0142 by Joelyn Oms, RN  Outcome: Progressing  Flowsheets (Taken 09/04/2018 0142)  Free from Infection during hospitalization:   Assess and monitor for signs and symptoms of infection   Monitor lab/diagnostic results   Monitor all insertion sites (i.e. indwelling lines, tubes, urinary  catheters, and drains)   Encourage patient and family to use good hand hygiene technique  09/04/2018 0142 by Joelyn Oms, RN  Outcome: Progressing     Problem: Pain  Goal: Pain at adequate level as identified by patient  09/04/2018 0142 by Joelyn Oms, RN  Outcome: Progressing  Flowsheets (Taken 09/04/2018 0142)  Pain at adequate level as identified by patient:   Identify patient comfort function goal   Assess pain on admission, during daily assessment and/or before any "as needed" intervention(s)   Reassess pain within 30-60 minutes of any procedure/intervention, per Pain Assessment, Intervention, Reassessment (AIR) Cycle   Evaluate patient's satisfaction with pain management progress   Evaluate if patient comfort function goal is met   Offer non-pharmacological pain management interventions  09/04/2018 0142 by Joelyn Oms, RN  Outcome: Progressing     Problem: Side Effects from Pain Analgesia  Goal: Patient will experience minimal side effects of analgesic therapy  09/04/2018 0142 by Joelyn Oms, RN  Outcome: Progressing  Flowsheets (Taken 09/04/2018 0142)  Patient will experience minimal side effects of analgesic therapy:   Monitor/assess patient's respiratory status (RR depth, effort, breath sounds)   Assess for changes in cognitive function   Prevent/manage side effects per LIP orders (i.e. nausea, vomiting, pruritus, constipation, urinary retention, etc.)  09/04/2018 0142 by Joelyn Oms, RN  Outcome: Progressing     Problem: Discharge Barriers  Goal: Patient will be discharged home or other facility with appropriate resources  09/04/2018 0142 by Joelyn Oms, RN  Outcome: Progressing  Flowsheets (Taken 09/04/2018 0142)  Discharge to home or other facility with appropriate resources:   Provide appropriate patient education   Provide information on available health resources  09/04/2018 0142 by Joelyn Oms, RN  Outcome: Progressing     Problem: Psychosocial and Spiritual Needs  Goal: Demonstrates  ability to cope with hospitalization/illness  09/04/2018 0142 by Joelyn Oms, RN  Outcome: Progressing  Flowsheets (Taken 09/04/2018 0142)  Demonstrates ability to cope with hospitalizations/illness:   Encourage verbalization of feelings/concerns/expectations   Provide quiet environment   Assist patient to identify own strengths and abilities   Encourage patient to set small goals for self   Encourage participation in diversional activity   Reinforce positive adaptation of new coping behaviors   Include patient/ patient care companion in decisions  09/04/2018 0142 by Joelyn Oms, RN  Outcome: Progressing     Problem: Compromised Tissue integrity  Goal: Damaged tissue is healing and protected  09/04/2018 0142 by Joelyn Oms, RN  Outcome: Progressing  Flowsheets (Taken 09/04/2018 0142)  Damaged tissue is healing and protected:   Monitor/assess Braden scale every shift   Increase activity as tolerated/progressive mobility   Relieve pressure to bony prominences for patients at moderate and high risk   Avoid shearing injuries   Keep intact skin clean and dry   Use bath wipes, not soap and water, for daily bathing   Use incontinence wipes for cleaning urine, stool and caustic drainage. Foley care as needed   Monitor external devices/tubes for correct placement to prevent pressure, friction and shearing   Encourage use of lotion/moisturizer on skin   Monitor patient's hygiene practices  09/04/2018 0142 by Joelyn Oms, RN  Outcome: Progressing  Goal: Nutritional status is improving  09/04/2018 0142 by Joelyn Oms, RN  Outcome: Progressing  Flowsheets (Taken 09/04/2018 0142)  Nutritional status is improving:   Assist patient with eating   Allow adequate time for meals   Encourage patient to take dietary supplement(s) as ordered  09/04/2018 0142 by Joelyn Oms, RN  Outcome: Progressing     Problem: Altered GI Function  Goal: Fluid and electrolyte balance are achieved/maintained  09/04/2018 0142 by Joelyn Oms,  RN  Outcome: Progressing  Flowsheets (Taken 09/04/2018 0142)  Fluid and electrolyte balance are achieved/maintained:   Monitor intake and output every shift   Monitor/assess lab values and report abnormal values   Provide adequate hydration   Assess for confusion/personality changes   Monitor daily weight   Assess and reassess fluid and electrolyte status   Observe for seizure activity and initiate seizure precautions if indicated   Observe for cardiac arrhythmias   Monitor for muscle weakness  09/04/2018 0142 by Joelyn Oms, RN  Outcome: Progressing  Goal: No bleeding  09/04/2018 0142 by Joelyn Oms, RN  Outcome: Progressing  Flowsheets (Taken 09/04/2018 0142)  No bleeding:   Monitor and assess vitals and hemodynamic parameters   Monitor/assess lab values and report abnormal values   Assess for bruising/petechia  09/04/2018 0142 by Joelyn Oms, RN  Outcome: Progressing

## 2018-09-04 NOTE — Plan of Care (Signed)
Problem: Moderate/High Fall Risk Score >5  Goal: Patient will remain free of falls  Outcome: Progressing  Flowsheets (Taken 09/04/2018 0800)  Moderate Risk (6-13):   MOD-Consider activation of bed alarm if appropriate   MOD-Apply bed exit alarm if patient is confused   MOD-Floor mat at bedside (where available) if appropriate   MOD-Remain with patient during toileting   MOD-Re-orient confused patients   MOD-Request PT/OT consult order for patients with gait/mobility impairment     Problem: Safety  Goal: Patient will be free from injury during hospitalization  Outcome: Progressing  Flowsheets (Taken 09/04/2018 1049)  Patient will be free from injury during hospitalization:   Assess patient's risk for falls and implement fall prevention plan of care per policy   Provide and maintain safe environment   Use appropriate transfer methods   Ensure appropriate safety devices are available at the bedside   Include patient/ family/ care giver in decisions related to safety   Hourly rounding   Assess for patients risk for elopement and implement Elopement Risk Plan per policy  Goal: Patient will be free from infection during hospitalization  Outcome: Progressing     Problem: Pain  Goal: Pain at adequate level as identified by patient  Outcome: Progressing  Flowsheets (Taken 09/04/2018 1049)  Pain at adequate level as identified by patient:   Identify patient comfort function goal   Assess for risk of opioid induced respiratory depression, including snoring/sleep apnea. Alert healthcare team of risk factors identified.   Assess pain on admission, during daily assessment and/or before any "as needed" intervention(s)   Reassess pain within 30-60 minutes of any procedure/intervention, per Pain Assessment, Intervention, Reassessment (AIR) Cycle   Evaluate if patient comfort function goal is met   Evaluate patient's satisfaction with pain management progress   Offer non-pharmacological pain management interventions     Problem:  Compromised Tissue integrity  Goal: Damaged tissue is healing and protected  Flowsheets (Taken 09/04/2018 1049)  Damaged tissue is healing and protected:   Monitor/assess Braden scale every shift   Provide wound care per wound care algorithm   Reposition patient every 2 hours and as needed unless able to reposition self   Increase activity as tolerated/progressive mobility   Relieve pressure to bony prominences for patients at moderate and high risk   Avoid shearing injuries   Keep intact skin clean and dry   Use bath wipes, not soap and water, for daily bathing   Use incontinence wipes for cleaning urine, stool and caustic drainage. Foley care as needed   Monitor external devices/tubes for correct placement to prevent pressure, friction and shearing   Encourage use of lotion/moisturizer on skin   Monitor patient's hygiene practices     Problem: Altered GI Function  Goal: Fluid and electrolyte balance are achieved/maintained  Outcome: Progressing  Flowsheets (Taken 09/04/2018 1049)  Fluid and electrolyte balance are achieved/maintained:   Monitor intake and output every shift   Monitor/assess lab values and report abnormal values   Provide adequate hydration   Assess for confusion/personality changes   Monitor daily weight   Assess and reassess fluid and electrolyte status   Observe for seizure activity and initiate seizure precautions if indicated   Observe for cardiac arrhythmias   Monitor for muscle weakness  Goal: No bleeding  Outcome: Progressing  Flowsheets (Taken 09/04/2018 1049)  No bleeding:   Monitor and assess vitals and hemodynamic parameters   Monitor/assess lab values and report abnormal values   Assess for bruising/petechia

## 2018-09-04 NOTE — Plan of Care (Signed)
Interventional Radiology  PLAN OF CARE    Date Time: 09/03/18 2:22 PM  Patient Name: Tara Perry, Tara Perry      Assessment:   [name] is a 73 y.o.femalewith recentcardiac arrest with AICD placement presenting with mild LUE swelling in setting of LUE DVT, likely due to recent lead placement.Repeat ultrasound shows extensive occlusive and nonocclusive LUE DVT in the region of the left pacer wire, significantly improved compared to FRC study dated 6/19. Course was complicated by new UGIB with drop in H/H s/p endoscopic clipping of two non bleeding duodenal ulcers 6/21. Lovenox discontinued 6/21 AM in light of UGIB. Moderate intensity heparin gtt started 6/23.    Hgb 9.1 --> 8.3  Per RN, no overt bleeding overnight.     Plan:   - Continue AC per primary team. Monitor H/H  - Outpatient follow up with Dr. Kathi Ludwig with repeat LUE venous ultrasound scheduled 7/30, info in d/c nav.    There are no current IR needs. Thank you for allowing [place] to be a part of your patients care. Please call with questions.    Lorenso Quarry, PA-C  Interventional Radiology  [facility]  Spectralink/Inpatient consults 7:30am-5pm Monday-Friday: [phone]   After hours office ph: [phone], fax: [phone]

## 2018-09-04 NOTE — Progress Notes (Signed)
[facility] Nursing Progress Note    Tara Perry is a 73 y.o. female  Admitted 08/30/2018  5:23 PM [facility] day 5)    Indication for continued [facility] status:  Hemodynamic monitoring    Mews Score: 2  Actions taken for MEWS if >4: N/A        Major Shift Events:  New orders placed by Cardiology. Home spironolactone restarted today. Atorvastatin increased from 20 mg to 40 mg daily. OOB to chair w/ PT. PRN Tylenol x1 for chest/rib pain related to previous CPR. 1600 H&H results at 8.3/24.2. VSS. No major events during this shift.    Review of Systems  Neuro:  A&Ox4. PEERL.  FC.   MAE.  Chest soreness.    Cardiac:  NSR. Pacemaker in place.  HR: 60-70s  SBP: 110-120s  Left arm edema.  Palpable pulses.  Afebrile.    Respiratory:  Lungs clear and equal bilaterally.  RA.    GI/GU:  Abdomen soft and nontender, Bowel sounds active.  Consistent carb diet.      BM this shift?  No    Skin Assessment  Skin Integrity: Bruising, Scars, Non-blanchable Redness  Bruising Skin Location: BUE    Braden Score: Braden Scale Score: 18 (09/04/18 0800)    Patient admitted/transferred to this unit this shift? No  If yes, 4 eyes in 4 hours check complete? N/A  Second RN name:    LDAWs  Patient Lines/Drains/Airways Status    Active Lines, Drains and Airways     Name:   Placement date:   Placement time:   Site:   Days:    Peripheral IV 09/01/18 Anterior;Right Hand   09/01/18    --    Hand   3    Midline IV 09/03/18 Anterior;Right Upper Arm   09/03/18    1457    Upper Arm   less than 1               Wound 08/26/18 Surgical Incision Chest Left BIV ICD IMPLANT (Active)   Date First Assessed/Time First Assessed: 08/26/18 1401   Wound Type: Surgical Incision  Location: Chest  Wound Location Orientation: Left  Wound Description (Comments): BIV ICD IMPLANT  Present on Admission: No      No assessment data to display       No Linked orders to display       Wound 09/02/18 Pressure Injury Stage 1 Coccyx (Active)   Date First Assessed/Time First Assessed: 09/02/18  2000   Wound Type: Pressure Injury  Pressure Injury Staging (WOCN/ Trained RNs Only): Stage 1  Location: Coccyx      Assessments 09/02/2018  8:00 PM 09/04/2018  8:00 AM   Site Description -- Pink;Clean;Dry;Intact   Peri-wound Description Clean;Dry;Intact Clean;Dry;Intact   Closure -- Approximated, closed and dry   Drainage Amount -- None   Margins -- Closed;Attached edges   Treatments Cleansed Cleansed   Dressing -- Open to air       No Linked orders to display           Indication for Central Access and estimated target removal date?  N/A      Indication for Foley and estimated target removal date?  N/A      Psycho/Social:  WDL. Calm and cooperative.     Interpreter Services:  Does the patient require an Interpreter? No    If so, what type of interpreter was used? N/A    If the family was utilized, is the interpreter waiver  signed and located in the chart? N/A

## 2018-09-04 NOTE — Progress Notes (Signed)
[place] HEART PROGRESS NOTE  [facility]      Date Time: 09/04/18 10:11 AM  Patient Name: Tara Perry, Tara Perry  Medical Record #:  [id number]  Account#:  [id number]  Admission Date:  08/30/2018         Patient Active Problem List   Diagnosis    Peritonitis    Type II diabetes mellitus without mention of complication    Gout synovitis    Type 2 diabetes mellitus without complications    Viral cardiomyopathy    CHF (congestive heart failure)    Cardiac arrest    Atrophic vaginitis    Chronic vulvitis    Arm DVT (deep venous thromboembolism), acute, left       Subjective:   Denies chest pain, SOB or palpitations.    Assessment:    Admitted with LUE edema, +DVT LUE. (L cephalic vein, left axillary and the left subclavian vein were all accessed for PPM placementg 08/26/18)   Hematemesis (RRT 09/01/18), A/C held. EGD 09/01/18: nonbleeding DU s/p epi injection and cautery/clipping   OHCA 08/16/18, in VF at presentation, defib w/ ROSC, s/p [employer] Bi-V ICD 08/26/18   LHC 08/22/18: normal cor's   Severe nonisch CM. Echo 08/19/18: LVEF 20%, RWMAs, no signif valve pathol. Echo EF 25% 2017   DM   HTN   HLD   Chronic LBBB   Anemia   CRI   US duplex 09/01/18: Extensive occlusive and nonocclusive left upper extremity DVT in the region of left pacer wire   Carotid US 02/2018: severe plaque burden with BL ICA stenosis 50-69%   EKG: Atrial sense, V pace.        Recommendations:    BP stable- agree with adding aldactone today and if lytes/creat stable should titrate to 25mg  in next few days   Titrate coreg tomorrow if BP remains stable (SBP>100)  to 6.25mg  bid and serially if BP tolerates toward pre-admission 25mg  bid dose   Will titrate entresto in follow up   Consider SGLT2i given DM/Cardiomyopathy in follow up   Please follow volume status with strict I/Os and daily standing weights   Increase statin to high intensity given severe carotid atherosclerosis, follow lipids in outpatient at  2mos.        Medications:      Scheduled Meds:    atorvastatin, 20 mg, Oral, Daily  carvedilol, 3.125 mg, Oral, Q12H SCH  insulin lispro, 1-3 Units, Subcutaneous, QHS  insulin lispro, 1-5 Units, Subcutaneous, TID AC  pantoprazole, 40 mg, Intravenous, BID  sacubitril-valsartan, 24-26 mg, Oral, Q12H SCH  spironolactone, 12.5 mg, Oral, Daily  vitamin B-12, 1,000 mcg, Oral, Daily        Continuous Infusions:   heparin infusion 25,000 units/500 mL (VTE/Moderate Intensity) 11 Units/kg/hr (09/04/18 0444)            Physical Exam:       VITAL SIGNS PHYSICAL EXAM   Vitals:    09/04/18 0900   BP: 124/60   Pulse: 67   Resp: (!) 31   Temp:    SpO2: 98%     Temp (24hrs), Avg:98.2 F (36.8 C), Min:97.2 F (36.2 C), Max:98.6 F (37 C)      Telemetry: Reviewed no changes  SR  A sensed- V PACED 60-70s    Intake/Output Summary (Last 24 hours) at 09/04/2018 1011  Last data filed at 09/04/2018 0600  Gross per 24 hour   Intake 576.25 ml   Output 0 ml   Net 576.25 ml  Physical Exam  General: awake, alert, breathing comfortably, no acute distress  Head: normocephalic  Eyes: EOM's intact  Cardiovascular: regular rate and rhythm, normal S1, S2, no S3, no S4, no murmurs, rubs or gallops  Neck: no carotid bruits or JVD  Lungs: clear to auscultation bilateraly, without wheezing, rhonchi, or rales  Abdomen: soft, non-tender, non-distended; no palpable masses,  normoactive bowel sounds  Extremities: no edema  Pulse: equal pulses, 4/4 symmetric  Neurological: Alert and oriented X3, mood and affect normal  Musculoskeletal: normal strength and tone         Labs:                       Recent Labs   Lab 09/04/18  0353  09/01/18  0621   PT  --   --  14.7   PT INR  --   --  1.2*   PTT 161*  More results in Results Review 33   More results in Results Review = values in this interval not displayed.     Recent Labs   Lab 09/04/18  0353 09/03/18  1259 09/03/18  0414  09/02/18  0406   WBC 9.89*  --  15.24*  --  15.69*   Hgb 8.3* 9.1* 9.3*  More results  in Results Review 10.1*   Hematocrit 25.1* 26.6* 28.3*  More results in Results Review 30.0*   Platelets 230  --  240  --  230   More results in Results Review = values in this interval not displayed.     Recent Labs   Lab 09/04/18  0353 09/03/18  1259 09/02/18  0406   Sodium 133* 135* 138   Potassium 3.8 4.3 4.8   Chloride 103 105 106   CO2 21* 20* 23   BUN 31.0* 37.0* 47.0*   Creatinine 1.1* 1.1* 1.3*   EGFR 48.6 48.6 40.0   Glucose 194* 165* 147*   Calcium 7.8* 8.0 8.7           Invalid input(s): FREET4    .  Lab Results   Component Value Date    BNP 139 (H) 08/16/2018        Weight Monitoring 08/26/2018 08/27/2018 08/30/2018 08/31/2018 08/31/2018 09/02/2018 09/04/2018   Height - - 165.1 cm - 165.1 cm - -   Height Method - - Stated - - - -   Weight 62.732 kg 63.368 kg 63.4 kg 61.644 kg 61.644 kg 61.3 kg 65.6 kg   Weight Method Standing Scale Standing Scale Stated Standing Scale - Bed Scale -   BMI (calculated) - - 23.3 kg/m2 - 22.7 kg/m2 - -       Imaging:       ____________________________________________    Signed by: Brett Fairy, MD      [place] [facility]  NP Spectralink [phone] (8am-5pm)  MD Spectralink [phone] or [phone] (8am-5pm)  Arrhythmia Spectralink [phone] (8am-4:30pm)  After hours, non urgent consult line [phone]  After Hours, urgent consults [phone]

## 2018-09-05 LAB — CBC
Absolute NRBC: 0 10*3/uL (ref 0.00–0.00)
Hematocrit: 24.5 % — ABNORMAL LOW (ref 34.7–43.7)
Hgb: 8.3 g/dL — ABNORMAL LOW (ref 11.4–14.8)
MCH: 30.6 pg (ref 25.1–33.5)
MCHC: 33.9 g/dL (ref 31.5–35.8)
MCV: 90.4 fL (ref 78.0–96.0)
MPV: 10.8 fL (ref 8.9–12.5)
Nucleated RBC: 0 /100 WBC (ref 0.0–0.0)
Platelets: 250 10*3/uL (ref 142–346)
RBC: 2.71 10*6/uL — ABNORMAL LOW (ref 3.90–5.10)
RDW: 13 % (ref 11–15)
WBC: 7.71 10*3/uL (ref 3.10–9.50)

## 2018-09-05 LAB — BASIC METABOLIC PANEL
Anion Gap: 8 (ref 5.0–15.0)
BUN: 25 mg/dL — ABNORMAL HIGH (ref 7.0–19.0)
CO2: 22 mEq/L (ref 22–29)
Calcium: 8 mg/dL (ref 7.9–10.2)
Chloride: 103 mEq/L (ref 100–111)
Creatinine: 1.3 mg/dL — ABNORMAL HIGH (ref 0.6–1.0)
Glucose: 231 mg/dL — ABNORMAL HIGH (ref 70–100)
Potassium: 3.7 mEq/L (ref 3.5–5.1)
Sodium: 133 mEq/L — ABNORMAL LOW (ref 136–145)

## 2018-09-05 LAB — GFR: EGFR: 40

## 2018-09-05 LAB — GLUCOSE WHOLE BLOOD - POCT
Whole Blood Glucose POCT: 226 mg/dL — ABNORMAL HIGH (ref 70–100)
Whole Blood Glucose POCT: 203 mg/dL — ABNORMAL HIGH (ref 70–100)
Whole Blood Glucose POCT: 218 mg/dL — ABNORMAL HIGH (ref 70–100)
Whole Blood Glucose POCT: 215 mg/dL — ABNORMAL HIGH (ref 70–100)

## 2018-09-05 LAB — APTT: PTT: 92 s — ABNORMAL HIGH (ref 23–37)

## 2018-09-05 MED ORDER — INSULIN LISPRO 100 UNIT/ML SC SOLN
2.00 [IU] | Freq: Three times a day (TID) | SUBCUTANEOUS | Status: DC
Start: 2018-09-05 — End: 2018-09-08
  Administered 2018-09-05 (×3): 4 [IU] via SUBCUTANEOUS
  Administered 2018-09-06: 12:00:00 6 [IU] via SUBCUTANEOUS
  Administered 2018-09-06: 18:00:00 4 [IU] via SUBCUTANEOUS
  Administered 2018-09-06: 08:00:00 2 [IU] via SUBCUTANEOUS
  Administered 2018-09-07: 10:00:00 4 [IU] via SUBCUTANEOUS
  Administered 2018-09-07: 13:00:00 6 [IU] via SUBCUTANEOUS
  Administered 2018-09-08: 12:00:00 8 [IU] via SUBCUTANEOUS
  Administered 2018-09-08: 08:00:00 4 [IU] via SUBCUTANEOUS
  Filled 2018-09-05: qty 18
  Filled 2018-09-05 (×5): qty 12
  Filled 2018-09-05: qty 6
  Filled 2018-09-05: qty 18
  Filled 2018-09-05: qty 24
  Filled 2018-09-05: qty 12

## 2018-09-05 MED ORDER — INSULIN LISPRO 100 UNIT/ML SC SOLN
1.00 [IU] | Freq: Every evening | SUBCUTANEOUS | Status: DC
Start: 2018-09-05 — End: 2018-09-08
  Administered 2018-09-05 – 2018-09-07 (×3): 2 [IU] via SUBCUTANEOUS
  Filled 2018-09-05 (×3): qty 6

## 2018-09-05 MED ORDER — INSULIN LISPRO 100 UNIT/ML SC SOLN
1.00 [IU] | Freq: Three times a day (TID) | SUBCUTANEOUS | Status: DC
Start: 2018-09-05 — End: 2018-09-05

## 2018-09-05 MED ORDER — SPIRONOLACTONE 25 MG PO TABS
25.0000 mg | ORAL_TABLET | Freq: Every day | ORAL | Status: DC
Start: 2018-09-06 — End: 2018-09-08
  Administered 2018-09-06 – 2018-09-08 (×3): 25 mg via ORAL
  Filled 2018-09-05 (×3): qty 1

## 2018-09-05 NOTE — Progress Notes (Signed)
[place] HEART PROGRESS NOTE  [facility]      Date Time: 09/05/18 9:52 AM  Patient Name: Tara Perry, Tara Perry  Medical Record #:  [id number]  Account#:  [id number]  Admission Date:  08/30/2018         Patient Active Problem List   Diagnosis    Peritonitis    Type II diabetes mellitus without mention of complication    Gout synovitis    Type 2 diabetes mellitus without complications    Viral cardiomyopathy    CHF (congestive heart failure)    Cardiac arrest    Atrophic vaginitis    Chronic vulvitis    Arm DVT (deep venous thromboembolism), acute, left       Subjective:   Still has some swelling in left arm and chest pain from CPR.  Blood pressure taken while he was in the room was 90/55 although she is not symptomatic.    Assessment:    Admitted with LUE edema, +DVT LUE. (L cephalic vein, left axillary and the left subclavian vein were all accessed for PPM placementg 08/26/18)   Hematemesis (RRT 09/01/18), A/C held. EGD 09/01/18: nonbleeding DU s/p epi injection and cautery/clipping   OHCA 08/16/18, in VF at presentation, defib w/ ROSC, s/p [employer] Bi-V ICD 08/26/18   LHC 08/22/18: normal cor's   Severe nonisch CM. Echo 08/19/18: LVEF 20%, RWMAs, no signif valve pathol. Echo EF 25% 2017   DM   HTN   HLD   Chronic LBBB   Anemia   CRI   US duplex 09/01/18: Extensive occlusive and nonocclusive left upper extremity DVT in the region of left pacer wire   Carotid US 02/2018: severe plaque burden with BL ICA stenosis 50-69%   EKG: Atrial sense, V paced    Recommendations:      Blood pressure is low again today, will hold Coreg at current dose and reassess tomorrow.   Increase aldactone to 25 mg daily today.  Will titrate entresto in follow up   Consider SGLT2i given DM/Cardiomyopathy in follow up; patient has an outpatient endocrinologist and she wants him involved in that discussion.   Continue to follow volume status with strict I/Os and daily standing weights   Lipitor increased to 40 mg daily  today, follow lipids as outpatient at 2mos.    Medications:      Scheduled Meds:    atorvastatin, 40 mg, Oral, Daily  carvedilol, 3.125 mg, Oral, Q12H SCH  insulin lispro, 1-6 Units, Subcutaneous, QHS  insulin lispro, 2-10 Units, Subcutaneous, TID AC  pantoprazole, 40 mg, Intravenous, BID  sacubitril-valsartan, 24-26 mg, Oral, Q12H SCH  spironolactone, 12.5 mg, Oral, Daily  vitamin B-12, 1,000 mcg, Oral, Daily        Continuous Infusions:   heparin infusion 25,000 units/500 mL (VTE/Moderate Intensity) 11.011 Units/kg/hr (09/04/18 1800)            Physical Exam:       VITAL SIGNS PHYSICAL EXAM   Vitals:    09/05/18 0800   BP: 106/53   Pulse: 64   Resp: (!) 27   Temp: 97.9 F (36.6 C)   SpO2: 97%     Temp (24hrs), Avg:98.1 F (36.7 C), Min:97.5 F (36.4 C), Max:98.6 F (37 C)      Telemetry: Reviewed no changes  SR  A sensed- V PACED 60-70s    Intake/Output Summary (Last 24 hours) at 09/05/2018 0952  Last data filed at 09/05/2018 0600  Gross per 24 hour   Intake 1207 ml  Output 1250 ml   Net -43 ml    Physical Exam  General: awake, alert, breathing comfortably, no acute distress  Head: normocephalic  Eyes: EOM's intact  Cardiovascular: regular rate and rhythm, normal S1, S2, no S3, no S4, no murmurs, rubs or gallops  Neck: no carotid bruits or JVD  Lungs: clear to auscultation bilateraly, without wheezing, rhonchi, or rales  Abdomen: soft, non-tender, non-distended; no palpable masses,  normoactive bowel sounds  Extremities: no edema  Pulse: equal pulses, 4/4 symmetric  Neurological: Alert and oriented X3, mood and affect normal  Musculoskeletal: normal strength and tone         Labs:                       Recent Labs   Lab 09/05/18  0356  09/01/18  0621   PT  --   --  14.7   PT INR  --   --  1.2*   PTT 92*  More results in Results Review 33   More results in Results Review = values in this interval not displayed.     Recent Labs   Lab 09/05/18  0356 09/04/18  1609 09/04/18  0353  09/03/18  0414   WBC 7.71  --   9.89*  --  15.24*   Hgb 8.3* 8.3* 8.3*  More results in Results Review 9.3*   Hematocrit 24.5* 24.2* 25.1*  More results in Results Review 28.3*   Platelets 250  --  230  --  240   More results in Results Review = values in this interval not displayed.     Recent Labs   Lab 09/05/18  0356 09/04/18  0353 09/03/18  1259   Sodium 133* 133* 135*   Potassium 3.7 3.8 4.3   Chloride 103 103 105   CO2 22 21* 20*   BUN 25.0* 31.0* 37.0*   Creatinine 1.3* 1.1* 1.1*   EGFR 40.0 48.6 48.6   Glucose 231* 194* 165*   Calcium 8.0 7.8* 8.0           Invalid input(s): FREET4    .  Lab Results   Component Value Date    BNP 139 (H) 08/16/2018        Weight Monitoring 08/27/2018 08/30/2018 08/31/2018 08/31/2018 09/02/2018 09/04/2018 09/05/2018   Height - 165.1 cm - 165.1 cm - - -   Height Method - Stated - - - - -   Weight 63.368 kg 63.4 kg 61.644 kg 61.644 kg 61.3 kg 65.6 kg 63.2 kg   Weight Method Standing Scale Stated Standing Scale - Bed Scale - -   BMI (calculated) - 23.3 kg/m2 - 22.7 kg/m2 - - -       Imaging:       ____________________________________________    Signed by: Lelan Pons, MD      [place] [facility]  NP Spectralink [phone] (8am-5pm)  MD Spectralink [phone] or [phone] (8am-5pm)  Arrhythmia Spectralink [phone] (8am-4:30pm)  After hours, non urgent consult line [phone]  After Hours, urgent consults [phone]

## 2018-09-05 NOTE — OT Progress Note (Signed)
[facility]   Occupational Therapy Treatment     [name]    MRN#: [id number]   Unit: [facility] TOWER 4  Bed: Z308/M578.46      Post Acute Care Therapy Recommendations:   Discharge Recommendations: Home with supervision, Home with home health OT     Milestones to be reached to achieve recommendation: n/a  Anticipate achievement in n/a sessions    DME Recommended for Discharge: Shower chair    If Home with supervision, Home with home health OT  recommended discharge disposition is not available, patient will need rehab.     Therapy discharge recommendations may change with patient status.  Please refer to most recent note for up-to-date recommendations.    Assessment:   Significant Findings: none  Rehab tech: n/a    Pt motivated to participate in OT tx. Reviewed ICD precautions and modified technique to don underwear. Pt able to don underwear with SBA; however she required rest break 2/2 c/o fatigue and chest pain from CPR. After rest break, pt able to functionally ambulate to sink and complete oral care. Pt declined returning to chair and requested to return to bed to rest. VSS throughout session. Pt will continue to benefit from skilled OT to maximize independence.  Patient left without needs and call bell within reach. RN notified of session outcome.      Treatment Activities: Modified ADL retraining, functional transfer training, modified bed mobility training, education on energy conservation/fall prevention techniques, compensatory technique education, UE strengthening, endurance training, Education on medical equipment beneficial for increasing ADL performance and safety in home environment.      Educated the patient to role of occupational therapy, plan of care, goals of therapy and safety with mobility and ADLs, energy conservation techniques, home safety.    Plan:    OT Frequency Recommended: follow-up visit only     Continue plan of care.       Precautions and  Contraindications:   Falls  ICD precautions     Updated Medical Status/Imaging/Labs:  reviewed    Subjective: "I haven't done this much in a long time. I'm exhausted"   Patient's medical condition is appropriate for Occupational Therapy intervention at this time.  Patient is agreeable to participation in the therapy session. Nursing clears patient for therapy.    Pain:   Scale: denied pain  Location:   Intervention:     Objective:   Patient received seated in a bedside chair with MCCSU monitors, PIV, no fall mat, no alarm, no SCDs in place.    Cognition   A&Ox4    Functional Mobility  Sit to Supine: SBA  Sit to Stand: SBA with r/w  Transfers: SBA with r/w    PMP - Progressive Mobility Protocol   PMP Activity: Step 6 - Walks in Room  Distance Walked (ft) (Step 6,7): 0 Feet       Balance  Static Sitting: Independent  Dynamic Sitting: Supervision  Static Standing: SBA with r/w  Dynamic Standing: SBA with r/w    Self Care and Home Management  Eating: independent  Grooming: SBA standing at sink to complete hand hygiene   Bathing: educated on modified/EC techniques and beneficial AE  LE Dressing: SBA with cueing for modified/EC technique    Therapeutic Exercises  Exercise incorporated into tx.    Participation: good  Endurance: fair    Patient left with call bell within reach, all needs met, SCDs not in place prior, fall mat  not in place prior, bed alarm engaged, chair alarm n/a and all questions answered. RN notified of session outcome and patient response.     Goals:  Time For Goal Achievement: 5 visits  ADL Goals  Patient will groom self: Supervision, at sinkside  Patient will dress upper body: Independent  Patient will dress lower body: Modified Independent  Patient will toilet: Independent  Mobility and Transfer Goals  Pt will perform functional transfers: Modified Independent, with rolling walker        Executive Fucntion Goals  Pt will follow energy conservation techniques: with verbalization of 3 ECTs to be used  during ADLs, with supervision                Caleen Jobs. Lenya Sterne, OTR/L, pager# [phone]    Time of Treatment  OT Received On: 09/05/18  Start Time: 0955  Stop Time: 1045  Time Calculation (min): 50 min    Treatment # 1 of 5 visits

## 2018-09-05 NOTE — Progress Notes (Signed)
MEDICINE PROGRESS NOTE    Date Time: 09/05/18 9:43 AM  Patient Name: Tara Perry  Attending Physician: Dani Gobble, MD    Assessment:     [phone] with h/o DMII, HTN, NICM EF 20%, recent discharge from [facility] for VF arrest s/p ICD placment, returned 2 days later on 6/19 with new LUE swelling, found to have extensive occlusive and nonocclusive DVTs in the region of L pacer wire, started on [facility] and then RRT called 6/21 for hematemesis, EGD showed nonbleeding duodenal ulcer s/p epi injection and cautery/clipping.  She was monitored in [facility] and downgraded on 6/25.  She was hypotensive and her GDMT was held.    # New LUE DVT associated with pacer wire  # Hematemesis 2/2 duodenal ulcer  # NICM E 20% s/p ICD 08/26/2018  # Chronic LBBB  # DMII  # HTN  # HLD  # Carotid artery stenosis      Plan:     Cont heparin gtt.  Heme consult.  Possible transition to Eliquis.  Plan to increase aldactone to 25mg  tomorrow if Cr stable.   Cont Coreg.  Cont entresto.   Lipitor increased to 40mg .   Transfer out of [facility] today.     Case discussed with: patient    Safety Checklist:     DVT prophylaxis:  CHEST guideline (See page [phone]) Chemical   Foley:  [place] Rn Foley protocol Not present               Reference for approximate charges of common labs: CBC auto diff - $76   BMP - $99   Mg - $79    Lines:     Patient Lines/Drains/Airways Status    Active PICC Line / CVC Line / PIV Line / Drain / Airway / Intraosseous Line / Epidural Line / ART Line / Line / Wound / Pressure Ulcer / NG/OG Tube     Name:   Placement date:   Placement time:   Site:   Days:    Peripheral IV 09/01/18 Anterior;Right Hand   09/01/18    --    Hand   4    Midline IV 09/03/18 Anterior;Right Upper Arm   09/03/18    1457    Upper Arm   1    Wound 08/26/18 Surgical Incision Chest Left BIV ICD IMPLANT   08/26/18    1401    Chest   9    Wound 09/02/18 Pressure Injury Stage 1 Coccyx   09/02/18    2000    Coccyx   2                 Disposition: (Please see PAF column for Expected  D/C Date)   Today's date: 09/05/2018   Admit Date: 08/30/2018  5:23 PM   LOS: 6  Clinical Milestones: transition to [facility]   Anticipated discharge needs: Home services      Subjective     CC: Arm DVT (deep venous thromboembolism), acute, left    Interval History/24 hour events: Transfer out of [facility] today  HPI/Subjective: Denies SOB, cont to have pain post CPR    Review of Systems:     As per HPI    Physical Exam:     VITAL SIGNS PHYSICAL EXAM   Temp:  [97.5 F (36.4 C)-98.6 F (37 C)] 97.9 F (36.6 C)  Heart Rate:  [60-75] 64  Resp Rate:  [19-42] 27  BP: (93-140)/(44-65) 106/53  Intake/Output Summary (Last 24 hours) at 09/05/2018 0943  Last data filed at 09/05/2018 0600  Gross per 24 hour   Intake 1207 ml   Output 1250 ml   Net -43 ml    Physical Exam  General: awake, alert   Cardiovascular: regular rate and rhythm, no murmurs, rubs or gallops  Lungs: clear to auscultation bilaterally, without wheezing, rhonchi, or rales  Abdomen: soft, non-tender, non-distended; no palpable masses,  normoactive bowel sounds  Extremities: no edema          Meds:     Medications were reviewed:  Current Facility-Administered Medications   Medication Dose Route Frequency    atorvastatin  40 mg Oral Daily    carvedilol  3.125 mg Oral Q12H SCH    insulin lispro  1-6 Units Subcutaneous QHS    insulin lispro  2-10 Units Subcutaneous TID AC    pantoprazole  40 mg Intravenous BID    sacubitril-valsartan  24-26 mg Oral Q12H SCH    spironolactone  12.5 mg Oral Daily    vitamin B-12  1,000 mcg Oral Daily     Current Facility-Administered Medications   Medication Dose Route Frequency Last Rate    heparin infusion 25,000 units/500 mL (VTE/Moderate Intensity)  18 Units/kg/hr Intravenous Continuous 11.011 Units/kg/hr (09/04/18 1800)     Current Facility-Administered Medications   Medication Dose Route    acetaminophen  650 mg Oral    calcium carbonate  500 mg Oral    dextrose  15 g of glucose Oral    And    dextrose  12.5 g  Intravenous    And    glucagon (rDNA)  1 mg Intramuscular    naloxone  0.2 mg Intravenous    ondansetron  4 mg Oral    Or    ondansetron  4 mg Intravenous         Labs:     Labs (last 72 hours):    Recent Labs   Lab 09/05/18  0356 09/04/18  1609 09/04/18  0353   WBC 7.71  --  9.89*   Hgb 8.3* 8.3* 8.3*   Hematocrit 24.5* 24.2* 25.1*   Platelets 250  --  230       Recent Labs   Lab 09/05/18  0356 09/04/18  2005  09/01/18  0621 08/31/18  0346   PT  --   --   --  14.7 15.4*   PT INR  --   --   --  1.2* 1.2*   PTT 92* 77*  More results in Results Review 33 36   More results in Results Review = values in this interval not displayed.    Recent Labs   Lab 09/05/18  0356 09/04/18  0353   Sodium 133* 133*   Potassium 3.7 3.8   Chloride 103 103   CO2 22 21*   BUN 25.0* 31.0*   Creatinine 1.3* 1.1*   Calcium 8.0 7.8*   Glucose 231* 194*                   Microbiology, reviewed and are significant for:  Microbiology Results     Procedure Component Value Units Date/Time    MRSA culture - Nares (If not done in triage) [[id number]] Collected:  09/01/18 0730    Specimen:  Culturette from Nares Updated:  09/02/18 0616     Culture MRSA Surveillance Negative for Methicillin Resistant Staph aureus    MRSA culture - Throat (If not done in  triage) [[id number]] Collected:  09/01/18 0730    Specimen:  Culturette from Throat Updated:  09/02/18 0616     Culture MRSA Surveillance Negative for Methicillin Resistant Staph aureus          Imaging, reviewed and are significant for:  Ct Head Wo Contrast    Result Date: 08/17/2018  No acute intracranial abnormality. Eloise Harman, MD  08/17/2018 12:36 AM    X-ray Chest Ap Portable    Result Date: 08/26/2018    No acute findings post ICD placement. Geanie Cooley, MD  08/26/2018 4:25 PM    Xr Chest Ap Portable    Result Date: 08/19/2018  Lines and tubes as described without pneumothorax. Bibasilar atelectasis without focal consolidation identified. Colonel Bald, MD  08/19/2018 12:35 PM    Xr Chest Ap  Portable    Result Date: 08/17/2018   Right IJ central venous catheter has been pulled back with tip now at the cavoatrial junction. Shelly Flatten, MD  08/17/2018 6:29 PM    Xr Chest Ap Portable    Result Date: 08/17/2018   1. Right IJ central venous catheter placement with tip in the right atrium. 2. Retrocardiac opacity may represent atelectasis or effusion. Shelly Flatten, MD  08/17/2018 5:28 PM    Chest Ap Portable    Result Date: 08/17/2018   1. ET tube tip 3.2 cm above the carina and gastric drainage tube sidehole in the stomach and tip below the field-of-view. 2. Central vascular congestion, likely upper lobe edema, and bibasilar atelectasis. Infection/aspiration cannot be entirely excluded. 3. Borderline enlarged cardiopericardial silhouette. Eloise Harman, MD  08/17/2018 12:11 AM    US Venous Duplex Doppler Arm Left    Result Date: 09/01/2018  Extensive occlusive and nonocclusive left upper extremity DVT in the region of left pacer wire, as detailed. Per discussion with clinical team at the time of dictation, there are aware of this finding. Janina Mayo, MD  09/01/2018 10:46 AM        Signed by: Dani Gobble, MD

## 2018-09-05 NOTE — Progress Notes (Signed)
Care Plan Notes    [facility] Nursing Progress Note    Tara Perry is a 73 y.o. female  Admitted 08/30/2018  5:23 PM [facility] day 6)    Indication for continued [facility] status:  Heparin gtt    Mews Score: 1    Major Shift Events:  Trended H&H, remains stable  Heparin gtt therapeutic at 11u/kg    Review of Systems  Neuro:  Aox4  FC  MAE    Soreness in chest from previous CPR    Cardiac:  NSR  SBP stable  afebrile  Nonpitting Edema LUE    BP 105/52    Pulse 62    Temp 97.5 F (36.4 C) (Oral)    Resp (!) 24    Ht 1.651 m (5\' 5" )    Wt 63.2 kg (139 lb 5.3 oz)    SpO2 97%    BMI 23.19 kg/m       Respiratory:  RA  Clear lung sounds    GI/GU:  Consistent carb diet  Adequate appetite    AUO    BM this shift?  No    Skin Assessment  Skin Integrity: Bruising, Scars  Bruising Skin Location: BUE    Braden Score: Braden Scale Score: 20 (09/04/18 2000)    Patient admitted/transferred to this unit this shift? no    LDAWs  Patient Lines/Drains/Airways Status    Active Lines, Drains and Airways     Name:   Placement date:   Placement time:   Site:   Days:    Peripheral IV 09/01/18 Anterior;Right Hand   09/01/18    --    Hand   4    Midline IV 09/03/18 Anterior;Right Upper Arm   09/03/18    1457    Upper Arm   1               Wound 08/26/18 Surgical Incision Chest Left BIV ICD IMPLANT (Active)   Date First Assessed/Time First Assessed: 08/26/18 1401   Wound Type: Surgical Incision  Location: Chest  Wound Location Orientation: Left  Wound Description (Comments): BIV ICD IMPLANT  Present on Admission: No      No assessment data to display       No Linked orders to display       Wound 09/02/18 Pressure Injury Stage 1 Coccyx (Active)   Date First Assessed/Time First Assessed: 09/02/18 2000   Wound Type: Pressure Injury  Pressure Injury Staging (WOCN/ Trained RNs Only): Stage 1  Location: Coccyx      Assessments 09/02/2018  8:00 PM 09/05/2018  2:00 AM   Site Description -- Pink;Clean;Dry;Intact   Peri-wound Description Clean;Dry;Intact  Clean;Dry;Intact   Closure -- Approximated, closed and dry   Drainage Amount -- None   Margins -- Closed;Attached edges   Treatments Cleansed --   Dressing -- Open to air       No Linked orders to display       Psycho/Social:  Calm and cooperative    Interpreter Services:  Does the patient require an Interpreter? no

## 2018-09-05 NOTE — Plan of Care (Signed)
HEMATOLOGY    Plan of Care        Date Time: 09/05/18 3:17 PM  Patient Name: Tara Perry  Requesting Physician: Dani Gobble, MD       Assessment:   LUE DVT with extensive occlusive and nonocclusive left upper extremity DVT in the region of left pacer wire    UGIB with hematemesis on therapeutic AC, GI consulted with EGD completed 6/21 revealing large duodenal ulcer with visible vessels s/p cauterization/clipping. Cleared to resume AC per GI rescs    Recommendations:   Agree with heparin gtt with continued close monitoring of H/H for recurrence of bleed  Trend CBC, transfuse for H/H <7  Recommend transition to therapeutic Lovenox for discharge planning for conservative management following recent UGIB with goal to transition DOAC (Eliquis) as outpt with stable H/H.    Case reviewed with Dr Zollie Pee    Signed by:     Armida Sans PA-C  HEMATOLOGY

## 2018-09-05 NOTE — Plan of Care (Signed)
Problem: Pain  Goal: Pain at adequate level as identified by patient  Outcome: Progressing  Flowsheets (Taken 09/05/2018 0642 by Joelyn Oms, RN)  Pain at adequate level as identified by patient:   Identify patient comfort function goal   Assess pain on admission, during daily assessment and/or before any "as needed" intervention(s)   Reassess pain within 30-60 minutes of any procedure/intervention, per Pain Assessment, Intervention, Reassessment (AIR) Cycle   Evaluate if patient comfort function goal is met   Evaluate patient's satisfaction with pain management progress   Offer non-pharmacological pain management interventions     Problem: Altered GI Function  Goal: Fluid and electrolyte balance are achieved/maintained  Outcome: Progressing  Flowsheets (Taken 09/05/2018 0642 by Joelyn Oms, RN)  Fluid and electrolyte balance are achieved/maintained:   Monitor intake and output every shift   Monitor/assess lab values and report abnormal values   Provide adequate hydration   Assess for confusion/personality changes   Monitor daily weight   Assess and reassess fluid and electrolyte status   Observe for seizure activity and initiate seizure precautions if indicated   Observe for cardiac arrhythmias   Monitor for muscle weakness  Goal: No bleeding  Outcome: Progressing  Flowsheets (Taken 09/05/2018 6213 by Joelyn Oms, RN)  No bleeding:   Monitor and assess vitals and hemodynamic parameters   Monitor/assess lab values and report abnormal values   Assess for bruising/petechia

## 2018-09-05 NOTE — Plan of Care (Signed)
Problem: Moderate/High Fall Risk Score >5  Goal: Patient will remain free of falls  Outcome: Progressing  Flowsheets (Taken 09/04/2018 2000)  Moderate Risk (6-13):   MOD-Consider activation of bed alarm if appropriate   MOD-Floor mat at bedside (where available) if appropriate   MOD-Remain with patient during toileting   MOD-Place bedside commode and assistive devices out of sight when not in use   MOD-Perform dangle, stand, walk (DSW) when getting patient up     Problem: Safety  Goal: Patient will be free from injury during hospitalization  Outcome: Progressing  Flowsheets (Taken 09/05/2018 [phone])  Patient will be free from injury during hospitalization:   Assess patient's risk for falls and implement fall prevention plan of care per policy   Provide and maintain safe environment   Use appropriate transfer methods   Ensure appropriate safety devices are available at the bedside   Include patient/ family/ care giver in decisions related to safety   Hourly rounding  Goal: Patient will be free from infection during hospitalization  Outcome: Progressing  Flowsheets (Taken 09/04/2018 0142)  Free from Infection during hospitalization:   Assess and monitor for signs and symptoms of infection   Monitor lab/diagnostic results   Monitor all insertion sites (i.e. indwelling lines, tubes, urinary catheters, and drains)   Encourage patient and family to use good hand hygiene technique     Problem: Pain  Goal: Pain at adequate level as identified by patient  Outcome: Progressing  Flowsheets (Taken 09/05/2018 0642)  Pain at adequate level as identified by patient:   Identify patient comfort function goal   Assess pain on admission, during daily assessment and/or before any "as needed" intervention(s)   Reassess pain within 30-60 minutes of any procedure/intervention, per Pain Assessment, Intervention, Reassessment (AIR) Cycle   Evaluate if patient comfort function goal is met   Evaluate patient's satisfaction with pain management  progress   Offer non-pharmacological pain management interventions     Problem: Side Effects from Pain Analgesia  Goal: Patient will experience minimal side effects of analgesic therapy  Outcome: Progressing  Flowsheets (Taken 09/04/2018 0142)  Patient will experience minimal side effects of analgesic therapy:   Monitor/assess patient's respiratory status (RR depth, effort, breath sounds)   Assess for changes in cognitive function   Prevent/manage side effects per LIP orders (i.e. nausea, vomiting, pruritus, constipation, urinary retention, etc.)     Problem: Discharge Barriers  Goal: Patient will be discharged home or other facility with appropriate resources  Outcome: Progressing  Flowsheets (Taken 09/04/2018 0142)  Discharge to home or other facility with appropriate resources:   Provide appropriate patient education   Provide information on available health resources     Problem: Psychosocial and Spiritual Needs  Goal: Demonstrates ability to cope with hospitalization/illness  Outcome: Progressing  Flowsheets (Taken 09/04/2018 0142)  Demonstrates ability to cope with hospitalizations/illness:   Encourage verbalization of feelings/concerns/expectations   Provide quiet environment   Assist patient to identify own strengths and abilities   Encourage patient to set small goals for self   Encourage participation in diversional activity   Reinforce positive adaptation of new coping behaviors   Include patient/ patient care companion in decisions     Problem: Compromised Tissue integrity  Goal: Damaged tissue is healing and protected  Outcome: Progressing  Flowsheets (Taken 09/05/2018 [phone])  Damaged tissue is healing and protected:   Monitor/assess Braden scale every shift   Reposition patient every 2 hours and as needed unless able to reposition self  Increase activity as tolerated/progressive mobility   Relieve pressure to bony prominences for patients at moderate and high risk   Avoid shearing injuries   Keep intact  skin clean and dry   Use bath wipes, not soap and water, for daily bathing   Use incontinence wipes for cleaning urine, stool and caustic drainage. Foley care as needed   Monitor external devices/tubes for correct placement to prevent pressure, friction and shearing   Encourage use of lotion/moisturizer on skin   Monitor patient's hygiene practices  Goal: Nutritional status is improving  Outcome: Progressing  Flowsheets (Taken 09/04/2018 0142)  Nutritional status is improving:   Assist patient with eating   Allow adequate time for meals   Encourage patient to take dietary supplement(s) as ordered     Problem: Altered GI Function  Goal: Fluid and electrolyte balance are achieved/maintained  Outcome: Progressing  Flowsheets (Taken 09/05/2018 [phone])  Fluid and electrolyte balance are achieved/maintained:   Monitor intake and output every shift   Monitor/assess lab values and report abnormal values   Provide adequate hydration   Assess for confusion/personality changes   Monitor daily weight   Assess and reassess fluid and electrolyte status   Observe for seizure activity and initiate seizure precautions if indicated   Observe for cardiac arrhythmias   Monitor for muscle weakness  Goal: No bleeding  Outcome: Progressing  Flowsheets (Taken 09/05/2018 0981)  No bleeding:   Monitor and assess vitals and hemodynamic parameters   Monitor/assess lab values and report abnormal values   Assess for bruising/petechia

## 2018-09-05 NOTE — PT Progress Note (Signed)
[facility]   Physical Therapy Treatment  Patient:  Tara Perry MRN#:  [id number]  Unit: [facility] TOWER 4  Bed: O756/E332.95      Post Acute Care Therapy Recommendations:   Discharge Recommendations: Home with home health PT, Home with supervision     Milestones to be reached to achieve recommendation: improve gait tolerance  Anticipate achievement in 1 sessions    DME Recommended for Discharge: (pt owns)    If Home with home health PT, Home with supervision recommended discharge disposition is not available, patient will need rehab.     Therapy discharge recommendations may change with patient status.  Please refer to most recent note for up-to-date recommendations.    Assessment:   Significant Findings: none    Pt demos good progress with therapy this session with continued improvements with balance and gait. Patient is able to progress functional transfers with supervision with min cues for use of grab bar from toilet. She benefits from cues with breathing strategies with mobility and pacing. Patient will benefit from continued skilled rehab to maximize functional independence and enhance overall safety with mobility.    Assessment: Decreased balance, Gait impairment, Decreased endurance/activity tolerance  Progress: Progressing toward goals  Prognosis: Good, With home health nursing/aide  Risks/Benefits/POC Discussed with Pt/Family: With patient  Patient left without needs and call bell within reach. RN notified of session outcome.     Treatment Activities: GT. TE    Educated the patient to role of physical therapy, plan of care, goals of therapy and safety with mobility and ADLs, energy conservation techniques.    Plan:   Treatment/Interventions: Exercise, Gait training, Neuromuscular re-education, Functional transfer training, LE strengthening/ROM, Endurance training, Stair training        PT Frequency: 3-4x/wk     Continue plan of care.       Precautions and Contraindications:    Precautions  Weight Bearing Status: no restrictions  Other Precautions: Falls    Updated Medical Status/Imaging/Labs: No results found.    Subjective: "I feel so much better today, I slept very well"   Patient Goal: To go home    Pain Assessment  Pain Assessment: No/denies pain    Patient's medical condition is appropriate for Physical Therapy intervention at this time.  Patient is agreeable to participation in the therapy session. Nursing clears patient for therapy.    Objective:   Observation of Patient/Vital Signs:  Patient is recumbent in bed, MCCSU monitoring, heparin gtt in place.    VSS throughout session    Cognition/Neuro Status  Arousal/Alertness: Appropriate responses to stimuli  Attention Span: Appears intact  Orientation Level: Oriented X4  Following Commands: Follows all commands and directions without difficulty  Safety Awareness: minimal verbal instruction  Behavior: calm;cooperative  Motor Planning: intact  Coordination: intact  Orientation Level: Oriented X4         Functional Mobility:  Supine to Sit: Supervision  Sit to Stand: Supervision  Stand to Sit: Supervision       Ambulation:  PMP - Progressive Mobility Protocol   PMP Activity: Step 6 - Walks in Room  Distance Walked (ft) (Step 6,7): 40 Feet  Ambulation: Supervision;with front-wheeled walker  Pattern: shuffle;Step through    Therapeutic Exercise  Strengthening: (pt performing seated TE in chair LAQ marches and AP)     Neuro Re-Ed  Standing Balance: dynamic gait training;supervision    Patient Participation: Good  Patient Endurance: Good    Patient left with call bell  within reach, all needs met, SCDs off, fall mat up, bed alarm na, chair alarm on, all lines/tubes as found, all questions answered. RN notified of session outcome and patient response.     Goals:  Goals  Goal Formulation: With patient  Time for Goal Acheivement: 3 visits  Goals: Select goal  Pt Will Go Supine To Sit: modified independent  Pt Will Perform Sit to Stand: with  supervision  Pt Will Transfer Bed/Chair: with rolling walker, with supervision  Pt Will Ambulate: 151-200 feet, with rolling walker, with supervision  Pt Will Go Up / Down Stairs: with supervision, With rail, 1-2 stairs      Time of Treatment  PT Received On: 09/05/18  Start Time: 0840  Stop Time: 0905  Time Calculation (min): 25 min  Treatment # 1 out of 3 visits      Duard Brady, PT, DPT  Pager number: [phone]

## 2018-09-05 NOTE — Progress Notes (Signed)
[facility]- Medical Critical Care Service [facility])      INTERMEDIATE CARE PROGRESS NOTE    Date Time: 09/05/18 7:07 AM  Patient Name: Tara Perry  Attending Physician: Al-Ghandour, Roni Bread, MD  Primary Care Physician: Tammi Sou, MD  Location/Room: Z610/R604.54     Assessment:    Tara Perry is a 73 y.o. female with past medical history of NICM and DM who was recently discharged from [facility] following a VF cardiac arrest. She returned to [facility] ED two days later with new left upper extremity swelling secondary to acute subclavian DVT. IR was consulted and recommended therapeutic enoxaparin. She was transferred to the CICU after developing acute hematemesis. GI was consulted and she underwent EGD which showed a large duodenal ulcer with visible vessels which were cauterized and clipped. A repeat ultrasound of the DVT showed reduction in size with no indication for thrombectomy.     Problem List:   GI bleed  Duodenal ulcer s/p cauterization and clipping  Left subclavian DVT  VF arrest  NICM s/p ICD  DM II  Gout    Last 24 hours:    No acute events.       Hospital Course:    6/19: Admitted, started on therapeutic lovenox  6/21: Acute hematemesis, EGD with bleeding duodenal ulcer that was cauterized and clipped  6/22: Downgraded to [facility]   6/24:  Stable h/h.      Plan:    NEURO: Critical care mobility protocol  - PT/OT, activity as tolerated    CARDIAC: NICM s/p ICD  - Continue statin  - Start coreg 3.125 daily  - Start entresto   - Per cardiology, start aldactone 12.5mg  daily tomorrow if electrolytes stable   - Cardiology ([facility]) following  - Blood pressure goal: MAP greater than 65    PULM: Protecting airway  - Supplemental O2 PRN to maintain SpO2 greater than 92%  - Encourage IS    GI: Duodenal ulcer s/p clipping and cauterization   - Monitor for signs of bleeding, frequent H/H  - consistent carb diet.   - GI prophylaxis: protonix IV BID   - Nutrition: As above  - GI following    RENAL:   AKI  -  Cr 1.3 today  - Monitor urine output  - Replace electrolytes as needed  - aldactone 12.5 mg qd, hold on dose increase for now.      ID: No indication for abx at this time, monitor    HEME: Anemia in the setting of GI bleeding  - Daily CBC  - Monitor for signs of bleeding  - heme consulted for transition to long term anticoagulation.     Left subclavian DVT  - continue heparin gtt  - Close monitoring for further bleeding  - SCDs  - Pharmacologic prophylaxis: As above    ENDO/RHEUM: DMII  - PRN SSI to maintain euglycemia   - Glucose goal: 100-180    LINES/FOLEY: PIV, remove foley    CODE STATUS: Full Code    Patient is currently able to make medical decisions.      Patients designated proxy is her son.    Discussed current diagnoses, prognosis and goals of care.     CONSULTANTS: GI, cardiology, hematology    DISPO: downgrade to med or cardiac tele, d/w Dr. Perrin Maltese for transfer, patient will be assigned to Dr. Penni Homans.    Physical Exam:   Temp:  [97.5 F (36.4 C)-98.6 F (37 C)] 97.5 F (36.4 C)  Heart Rate:  [60-75] 62  Resp Rate:  [19-42] 24  BP: (93-140)/(44-65) 105/52    BP 105/52    Pulse 62    Temp 97.5 F (36.4 C) (Oral)    Resp (!) 24    Ht 1.651 m (5\' 5" )    Wt 63.2 kg (139 lb 5.3 oz)    SpO2 97%    BMI 23.19 kg/m      General Appearance:  alert, well appearing, and in no distress  Mental status:  alert, oriented to person, place, and time  Neuro:  alert, oriented, normal speech, no focal findings or movement disorder noted  HEENT: supple, no significant adenopathy  Pulmonary: clear to auscultation, no wheezes, rales or rhonchi, symmetric air entry  Cardiac: normal rate, regular rhythm, normal S1, S2, no murmurs, rubs, clicks or gallops  Abdomen:  soft, nontender, nondistended, no masses or organomegaly  Extremities: peripheral pulses normal, no pedal edema, L>R extremity swelling, ecchymosis R antecubital space.  Skin:   normal coloration and turgor, no rashes, no suspicious skin lesions noted     Labs:     Results     Procedure Component Value Units Date/Time    Basic Metabolic Panel [[id number]]  (Abnormal) Collected:  09/05/18 0356    Specimen:  Blood Updated:  09/05/18 0530     Glucose 231 mg/dL      BUN 08.6 mg/dL      Creatinine 1.3 mg/dL      Calcium 8.0 mg/dL      Sodium 578 mEq/L      Potassium 3.7 mEq/L      Chloride 103 mEq/L      CO2 22 mEq/L      Anion Gap 8.0    GFR [[id number]] Collected:  09/05/18 0356     Updated:  09/05/18 0530     EGFR 40.0    APTT [[id number]]  (Abnormal) Collected:  09/05/18 0356     Updated:  09/05/18 0517     PTT 92 sec     Narrative:       Obtain baseline aPTT prior to heparin initiation if not drawn  previously. Do not wait for aPTT result prior to heparin  initiation. When therapeutic range is reached per protocol,  change aPTT frequency to daily at [facility] daily at 0400 until  heparin is discontinued, except for ICU patients - continue  q8h aPTTs    CBC without differential [[id number]]  (Abnormal) Collected:  09/05/18 0356    Specimen:  Blood Updated:  09/05/18 0503     Hgb 8.3 g/dL      Hematocrit 03.4 %      WBC 7.71 x10 3/uL      Platelets 250 x10 3/uL      RBC 2.71 x10 6/uL      MCV 90.4 fL      MCH 30.6 pg      MCHC 33.9 g/dL      RDW 13 %      MPV 10.8 fL      Nucleated RBC 0.0 /100 WBC      Absolute NRBC 0.00 x10 3/uL     Prepare/Crossmatch Red Blood Cells:  One Unit, 1 Units [[id number]] Collected:  09/01/18 0621     Updated:  09/05/18 0044     RBC Leukoreduced RBC Leukoreduced     BLUNIT [id number]     Status transfused     PRODUCT CODE (NON READABLE) Z6606T01     Expiration Date [id number]  UTYPE B POS     ISBT CODE 7300    Glucose Whole Blood - POCT [[id number]]  (Abnormal) Collected:  09/04/18 2141     Updated:  09/04/18 2156     Whole Blood Glucose POCT 234 mg/dL     APTT [[id number]]  (Abnormal) Collected:  09/04/18 2005     Updated:  09/04/18 2101     PTT 77 sec     Narrative:       Obtain baseline aPTT prior to heparin initiation if not drawn   previously. Do not wait for aPTT result prior to heparin  initiation. When therapeutic range is reached per protocol,  change aPTT frequency to daily at [facility] daily at 0400 until  heparin is discontinued, except for ICU patients - continue  q8h aPTTs    Hemoglobin and hematocrit, blood [[id number]]  (Abnormal) Collected:  09/04/18 1609    Specimen:  Blood Updated:  09/04/18 1642     Hgb 8.3 g/dL      Hematocrit 08.6 %     Glucose Whole Blood - POCT [[id number]]  (Abnormal) Collected:  09/04/18 1612     Updated:  09/04/18 1632     Whole Blood Glucose POCT 208 mg/dL     APTT [[id number]]  (Abnormal) Collected:  09/04/18 1154     Updated:  09/04/18 1245     PTT 91 sec     Narrative:       Obtain baseline aPTT prior to heparin initiation if not drawn  previously. Do not wait for aPTT result prior to heparin  initiation. When therapeutic range is reached per protocol,  change aPTT frequency to daily at [facility] daily at 0400 until  heparin is discontinued, except for ICU patients - continue  q8h aPTTs    Glucose Whole Blood - POCT [[id number]]  (Abnormal) Collected:  09/04/18 1112     Updated:  09/04/18 1137     Whole Blood Glucose POCT 269 mg/dL     Glucose Whole Blood - POCT [[id number]]  (Abnormal) Collected:  09/04/18 0750     Updated:  09/04/18 0753     Whole Blood Glucose POCT 196 mg/dL           Radiology / Imaging:     US Venous Duplex Doppler Arm Left   Final Result      Extensive occlusive and nonocclusive left upper extremity DVT in the   region of left pacer wire, as detailed. Per discussion with clinical   team at the time of dictation, there are aware of this finding.      Janina Mayo, MD    09/01/2018 10:46 AM          I have spent 38 minutes providing care of this patient. >50% of that time was spent in counseling and coordination of care. Any care time performed today is exclusive of teaching, billable procedures, and not overlapping with any other providers.    Plan of care d/w Dr. Melvia Heaps, Roni Bread, MD.    Signed  by: Jacklyn Shell ACNP-BC  09/05/2018 7:07 AM  [id number], Marlynn Perking, MD

## 2018-09-06 LAB — CBC
Absolute NRBC: 0 10*3/uL (ref 0.00–0.00)
Absolute NRBC: 0 10*3/uL (ref 0.00–0.00)
Hematocrit: 24 % — ABNORMAL LOW (ref 34.7–43.7)
Hematocrit: 23.9 % — ABNORMAL LOW (ref 34.7–43.7)
Hgb: 7.9 g/dL — ABNORMAL LOW (ref 11.4–14.8)
Hgb: 8.2 g/dL — ABNORMAL LOW (ref 11.4–14.8)
MCH: 29.8 pg (ref 25.1–33.5)
MCH: 31.1 pg (ref 25.1–33.5)
MCHC: 32.9 g/dL (ref 31.5–35.8)
MCHC: 34.3 g/dL (ref 31.5–35.8)
MCV: 90.6 fL (ref 78.0–96.0)
MCV: 90.5 fL (ref 78.0–96.0)
MPV: 10.4 fL (ref 8.9–12.5)
MPV: 10.3 fL (ref 8.9–12.5)
Nucleated RBC: 0 /100 WBC (ref 0.0–0.0)
Nucleated RBC: 0 /100 WBC (ref 0.0–0.0)
Platelets: 255 10*3/uL (ref 142–346)
Platelets: 258 10*3/uL (ref 142–346)
RBC: 2.65 10*6/uL — ABNORMAL LOW (ref 3.90–5.10)
RBC: 2.64 10*6/uL — ABNORMAL LOW (ref 3.90–5.10)
RDW: 13 % (ref 11–15)
RDW: 13 % (ref 11–15)
WBC: 7.45 10*3/uL (ref 3.10–9.50)
WBC: 7.69 10*3/uL (ref 3.10–9.50)

## 2018-09-06 LAB — GLUCOSE WHOLE BLOOD - POCT
Whole Blood Glucose POCT: 198 mg/dL — ABNORMAL HIGH (ref 70–100)
Whole Blood Glucose POCT: 262 mg/dL — ABNORMAL HIGH (ref 70–100)
Whole Blood Glucose POCT: 208 mg/dL — ABNORMAL HIGH (ref 70–100)
Whole Blood Glucose POCT: 212 mg/dL — ABNORMAL HIGH (ref 70–100)

## 2018-09-06 LAB — BASIC METABOLIC PANEL
Anion Gap: 8 (ref 5.0–15.0)
BUN: 21 mg/dL — ABNORMAL HIGH (ref 7.0–19.0)
CO2: 23 mEq/L (ref 22–29)
Calcium: 7.8 mg/dL — ABNORMAL LOW (ref 7.9–10.2)
Chloride: 104 mEq/L (ref 100–111)
Creatinine: 1.2 mg/dL — ABNORMAL HIGH (ref 0.6–1.0)
Glucose: 197 mg/dL — ABNORMAL HIGH (ref 70–100)
Potassium: 3.7 mEq/L (ref 3.5–5.1)
Sodium: 135 mEq/L — ABNORMAL LOW (ref 136–145)

## 2018-09-06 LAB — TYPE AND SCREEN
AB Screen Gel: NEGATIVE
ABO Rh: B POS

## 2018-09-06 LAB — MAGNESIUM: Magnesium: 1.7 mg/dL (ref 1.6–2.6)

## 2018-09-06 LAB — GFR: EGFR: 43.9

## 2018-09-06 LAB — APTT: PTT: 94 s — ABNORMAL HIGH (ref 23–37)

## 2018-09-06 MED ORDER — POTASSIUM CHLORIDE CRYS ER 20 MEQ PO TBCR
40.00 meq | EXTENDED_RELEASE_TABLET | Freq: Once | ORAL | Status: AC
Start: 2018-09-06 — End: 2018-09-06
  Administered 2018-09-06: 08:00:00 40 meq via ORAL
  Filled 2018-09-06: qty 2

## 2018-09-06 NOTE — Progress Notes (Signed)
Care Plan Notes    [facility] Nursing Progress Note    Tara Perry is a 73 y.o. female  Admitted 08/30/2018  5:23 PM [facility] day 7)    Indication for continued [facility] status:  Downgrade    Mews Score: 1      Major Shift Events:  Downgraded pt to CTUS 3.72  Report given to [place], Charity fundraiser.   Daughter and pt belongings sent with pt.   Pt stable upon arrival.  Heparin gtt remains therapeutic     Review of Systems    Neuro:  AOx4  MAE  FC     Soreness in chest from previous CPR    Cardiac:  NSR  AICD  SBP stable  Nonpitting edema in LUE    BP 102/61    Pulse 61    Temp 97.2 F (36.2 C) (Oral)    Resp 18    Ht 1.651 m (5\' 5" )    Wt 63.2 kg (139 lb 5.3 oz)    SpO2 98%    BMI 23.19 kg/m       Respiratory:  Room air  Clear lung sounds  Weak cough    GI/GU:  Consistent carb diet   Abd soft, nondistended, nontender  AUO    BM this shift? No    Skin Assessment  Skin Integrity: Bruising, Cracking, Scars, Blanchable Redness  Bruising Skin Location: abdomen, BUE   RUE hematoma, heat pack applied and elevated on pillow, Oncoming RN notified to continue to monitor. Educated pt to alert RN with changes    Braden Score: Braden Scale Score: 19 (09/06/18 0000)    Patient admitted/transferred to this unit this shift? No    LDAWs  Patient Lines/Drains/Airways Status    Active Lines, Drains and Airways     Name:   Placement date:   Placement time:   Site:   Days:    Peripheral IV 09/01/18 Anterior;Right Hand   09/01/18    --    Hand   5    Midline IV 09/03/18 Anterior;Right Upper Arm   09/03/18    1457    Upper Arm   2               Wound 08/26/18 Surgical Incision Chest Left BIV ICD IMPLANT (Active)   Date First Assessed/Time First Assessed: 08/26/18 1401   Wound Type: Surgical Incision  Location: Chest  Wound Location Orientation: Left  Wound Description (Comments): BIV ICD IMPLANT  Present on Admission: No      No assessment data to display       No Linked orders to display       Wound 09/02/18 Pressure Injury Stage 1 Coccyx (Active)   Date  First Assessed/Time First Assessed: 09/02/18 2000   Wound Type: Pressure Injury  Pressure Injury Staging (WOCN/ Trained RNs Only): Stage 1  Location: Coccyx      Assessments 09/02/2018  8:00 PM 09/06/2018 12:00 AM   Site Description -- Red;Clean;Dry   Peri-wound Description Clean;Dry;Intact --   Drainage Amount -- None   Treatments Cleansed Zinc - oxide paste   Dressing -- Open to air       No Linked orders to display     Psycho/Social:  Calm and cooperative    Interpreter Services:  Does the patient require an Interpreter? no

## 2018-09-06 NOTE — Progress Notes (Signed)
[place] [facility] PROGRESS NOTE  [facility]      Date Time: 09/06/18 9:01 AM  Patient Name: Tara Perry, Tara Perry  Medical Record #:  [id number]  Account#:  [id number]  Admission Date:  08/30/2018         Patient Active Problem List   Diagnosis    Peritonitis    Type II diabetes mellitus without mention of complication    Gout synovitis    Type 2 diabetes mellitus without complications    Viral cardiomyopathy    CHF (congestive heart failure)    Cardiac arrest    Atrophic vaginitis    Chronic vulvitis    Arm DVT (deep venous thromboembolism), acute, left       Subjective:   BP acceptable x24h - no CP/SOB    Assessment:    Admitted with LUE edema, +DVT LUE. (L cephalic vein, left axillary and the left subclavian vein were all accessed for PPM placementg 08/26/18)   Hematemesis (RRT 09/01/18), A/C held. EGD 09/01/18: nonbleeding DU s/p epi injection and cautery/clipping   OHCA 08/16/18, in VF at presentation, defib w/ ROSC, s/p [employer] Bi-V ICD 08/26/18   LHC 08/22/18: normal coronary arteries   Severe NICM. Echo 08/19/18: LVEF 20%, RWMAs, no signif valve pathol. Echo EF 25% 2017   DM   HTN   HLD   Chronic LBBB   Anemia   CRI   US duplex 09/01/18: Extensive occlusive and nonocclusive left upper extremity DVT in the region of left pacer wire   Carotid US 02/2018: severe plaque burden with BL ICA stenosis 50-69%   EKG: Atrial sense, V paced    Recommendations:      BP is low and she likely has a persistent vasodilatory state stemming from her critical illness - this should improve with time and as such her cardiomyopathic medications can be uptitrated in the outpatient setting.   Would discharge her on the current low-dose regimen of carvedilol 3.125mg  q12h, Entresto 24/26mg  q12h, and spironolactone 25mg  daily.   Anticoagulation plan per hematology.   We will arrange follow-up in 1-2 weeks - please call w/ any cardiac questions in the interim.    Medications:      Scheduled Meds:    atorvastatin, 40 mg,  Oral, Daily  carvedilol, 3.125 mg, Oral, Q12H SCH  insulin lispro, 1-6 Units, Subcutaneous, QHS  insulin lispro, 2-10 Units, Subcutaneous, TID AC  pantoprazole, 40 mg, Intravenous, BID  sacubitril-valsartan, 24-26 mg, Oral, Q12H SCH  spironolactone, 25 mg, Oral, Daily  vitamin B-12, 1,000 mcg, Oral, Daily        Continuous Infusions:   heparin infusion 25,000 units/500 mL (VTE/Moderate Intensity) 11 Units/kg/hr (09/06/18 0002)            Physical Exam:       VITAL SIGNS PHYSICAL EXAM   Vitals:    09/05/18 2346 09/06/18 0200 09/06/18 0416 09/06/18 0814   BP: 102/61  107/57 118/69   Pulse: 61  64 74   Resp: 18  16 20    Temp: 97.2 F (36.2 C)  97.5 F (36.4 C) 97.5 F (36.4 C)   TempSrc: Oral  Oral Oral   SpO2: 98%  96% 97%   Weight:  62.7 kg (138 lb 3.2 oz)     Height:           Telemetry: Reviewed no changes  SR  A sensed- V PACED 60-70s    Intake/Output Summary (Last 24 hours) at 09/06/2018 0901  Last data filed at 09/06/2018  0236  Gross per 24 hour   Intake 675.58 ml   Output 600 ml   Net 75.58 ml    Physical Exam  General: awake, alert, breathing comfortably, no acute distress  Head: normocephalic  Eyes: EOM's intact  Cardiovascular: regular rate and rhythm, normal S1, S2, no S3, no S4, no murmurs, rubs or gallops  Neck: no carotid bruits or JVD  Lungs: clear to auscultation bilateraly, without wheezing, rhonchi, or rales  Abdomen: soft, non-tender, non-distended; no palpable masses,  normoactive bowel sounds  Extremities: no edema  Pulse: equal pulses, 4/4 symmetric  Neurological: Alert and oriented X3, mood and affect normal  Musculoskeletal: normal strength and tone         Labs:                       Recent Labs   Lab 09/06/18  0407  09/01/18  0621   PT  --   --  14.7   PT INR  --   --  1.2*   PTT 94*  More results in Results Review 33   More results in Results Review = values in this interval not displayed.     Recent Labs   Lab 09/06/18  0407 09/05/18  0356 09/04/18  1609 09/04/18  0353   WBC 7.45 7.71  --   9.89*   Hgb 7.9* 8.3* 8.3* 8.3*   Hematocrit 24.0* 24.5* 24.2* 25.1*   Platelets 255 250  --  230     Recent Labs   Lab 09/06/18  0407 09/05/18  0356 09/04/18  0353   Sodium 135* 133* 133*   Potassium 3.7 3.7 3.8   Chloride 104 103 103   CO2 23 22 21*   BUN 21.0* 25.0* 31.0*   Creatinine 1.2* 1.3* 1.1*   EGFR 43.9 40.0 48.6   Glucose 197* 231* 194*   Calcium 7.8* 8.0 7.8*           Invalid input(s): FREET4    .  Lab Results   Component Value Date    BNP 139 (H) 08/16/2018        Weight Monitoring 08/30/2018 08/31/2018 08/31/2018 09/02/2018 09/04/2018 09/05/2018 09/06/2018   Height 165.1 cm - 165.1 cm - - - -   Height Method Stated - - - - - -   Weight 63.4 kg 61.644 kg 61.644 kg 61.3 kg 65.6 kg 63.2 kg 62.687 kg   Weight Method Stated Standing Scale - Bed Scale - - Standing Scale   BMI (calculated) 23.3 kg/m2 - 22.7 kg/m2 - - - -       Imaging:       ____________________________________________    Signed by: Arcola Jansky, MD      [facility]  NP Spectralink [phone] (8am-5pm)  MD Spectralink [phone] or [phone] (8am-5pm)  Arrhythmia Spectralink [phone] (8am-4:30pm)  After hours, non urgent consult line [phone]  After Hours, urgent consults [phone]

## 2018-09-06 NOTE — Plan of Care (Signed)
Shift Note:      Orientation: AOx 4  Rhythm on tele: AV paced  Oxygen: RA  Ambulation:  x1 assist w/walker  Pain: pain in chest from CPR  Lines/Drips: RUA Midline, Mod Intensity Heparin  GI/GU: cont x2  Fall Score: Mod    Critical Labs/Imaging/Procedures:   6/21 EGD: large duodenal ulcer with visible vessels s/p cauterization/clipping  6/21 [place] Dopp LUE: left upper extremity DVT    Comments:   - L arm DVT- limb restriction- BP R forearm  - see doc flow for complete assessment and vitals.     Significant Shift Events and Questions for Attending:    Plan:   - Vitals Q4H   -APTT at 0400 on 6/27  -monitor H/H  -IV Protonix BID  -PO Aldactone  -I/O.DW  -Hematology following  -PT/OT rec [facility] PT/OT

## 2018-09-06 NOTE — Progress Notes (Addendum)
MEDICINE PROGRESS NOTE    Date Time: 09/06/18 12:11 PM  Patient Name: Tara Perry  Attending Physician: Dani Gobble, MD    Assessment:     [phone] with h/o DMII, HTN, NICM EF 20%, recent discharge from [facility] for VF arrest s/p ICD placment, returned 2 days later on 6/19 with new LUE swelling, found to have extensive occlusive and nonocclusive DVTs in the region of L pacer wire, started on [facility] and then RRT called 6/21 for hematemesis, EGD showed nonbleeding duodenal ulcer s/p epi injection and cautery/clipping.  She was monitored in [facility] and downgraded on 6/25.  She was hypotensive and her GDMT was held.    # New LUE DVT associated with pacer wire  # Hematemesis 2/2 duodenal ulcer  # NICM E 20% s/p ICD 08/26/2018  # Chronic LBBB  # DMII  # HTN  # HLD  # Carotid artery stenosis      Plan:     Cont heparin gtt.  Heme consulted.    Will recheck Hgb today and if stable, transition to Eliquis vs. Lovenox.  Will discuss with GI.  Cont aldactone to 25mg , coreg, entresto.  Plan to uptitrate GDMT as outpatient..   Lipitor increased to 40mg .   Possible d/c in a.m if Hgb stable.      Case discussed with: patient    Safety Checklist:     DVT prophylaxis:  CHEST guideline (See page [phone]) Chemical   Foley:  [place] Rn Foley protocol Not present               Reference for approximate charges of common labs: CBC auto diff - $76   BMP - $99   Mg - $79    Lines:     Patient Lines/Drains/Airways Status    Active PICC Line / CVC Line / PIV Line / Drain / Airway / Intraosseous Line / Epidural Line / ART Line / Line / Wound / Pressure Ulcer / NG/OG Tube     Name:   Placement date:   Placement time:   Site:   Days:    Peripheral IV 09/01/18 Anterior;Right Hand   09/01/18    --    Hand   4    Midline IV 09/03/18 Anterior;Right Upper Arm   09/03/18    1457    Upper Arm   1    Wound 08/26/18 Surgical Incision Chest Left BIV ICD IMPLANT   08/26/18    1401    Chest   9    Wound 09/02/18 Pressure Injury Stage 1 Coccyx   09/02/18    2000    Coccyx    2                 Disposition: (Please see PAF column for Expected D/C Date)   Today's date: 09/06/2018   Admit Date: 08/30/2018  5:23 PM   LOS: 7  Clinical Milestones: transition to [facility]   Anticipated discharge needs: Home services      Subjective     CC: Arm DVT (deep venous thromboembolism), acute, left    Interval History/24 hour events: Transfer out of [facility] yesterday.  HPI/Subjective: Denies SOB.  Feels arm swelling is improving.    Review of Systems:     As per HPI    Physical Exam:     VITAL SIGNS PHYSICAL EXAM   Temp:  [97.2 F (36.2 C)-98.5 F (36.9 C)] 97.5 F (36.4 C)  Heart Rate:  [61-85] 64  Resp  Rate:  [16-35] 20  BP: (102-125)/(54-69) 112/63        Intake/Output Summary (Last 24 hours) at 09/06/2018 1211  Last data filed at 09/06/2018 0236  Gross per 24 hour   Intake 675.58 ml   Output 600 ml   Net 75.58 ml    Physical Exam  General: awake, alert   Cardiovascular: regular rate and rhythm, no murmurs, rubs or gallops  Lungs: clear to auscultation bilaterally, without wheezing, rhonchi, or rales  Abdomen: soft, non-tender, non-distended; no palpable masses,  normoactive bowel sounds  Extremities: no edema   L arm swelling>R       Meds:     Medications were reviewed:  Current Facility-Administered Medications   Medication Dose Route Frequency    atorvastatin  40 mg Oral Daily    carvedilol  3.125 mg Oral Q12H SCH    insulin lispro  1-6 Units Subcutaneous QHS    insulin lispro  2-10 Units Subcutaneous TID AC    pantoprazole  40 mg Intravenous BID    sacubitril-valsartan  24-26 mg Oral Q12H SCH    spironolactone  25 mg Oral Daily    vitamin B-12  1,000 mcg Oral Daily     Current Facility-Administered Medications   Medication Dose Route Frequency Last Rate    heparin infusion 25,000 units/500 mL (VTE/Moderate Intensity)  18 Units/kg/hr Intravenous Continuous 11 Units/kg/hr (09/06/18 0002)     Current Facility-Administered Medications   Medication Dose Route    acetaminophen  650 mg Oral    calcium  carbonate  500 mg Oral    dextrose  15 g of glucose Oral    And    dextrose  12.5 g Intravenous    And    glucagon (rDNA)  1 mg Intramuscular    naloxone  0.2 mg Intravenous    ondansetron  4 mg Oral    Or    ondansetron  4 mg Intravenous         Labs:     Labs (last 72 hours):    Recent Labs   Lab 09/06/18  0407 09/05/18  0356   WBC 7.45 7.71   Hgb 7.9* 8.3*   Hematocrit 24.0* 24.5*   Platelets 255 250       Recent Labs   Lab 09/06/18  0407 09/05/18  0356  09/01/18  0621 08/31/18  0346   PT  --   --   --  14.7 15.4*   PT INR  --   --   --  1.2* 1.2*   PTT 94* 92*  More results in Results Review 33 36   More results in Results Review = values in this interval not displayed.    Recent Labs   Lab 09/06/18  0407 09/05/18  0356   Sodium 135* 133*   Potassium 3.7 3.7   Chloride 104 103   CO2 23 22   BUN 21.0* 25.0*   Creatinine 1.2* 1.3*   Calcium 7.8* 8.0   Glucose 197* 231*                   Microbiology, reviewed and are significant for:  Microbiology Results     Procedure Component Value Units Date/Time    MRSA culture - Nares (If not done in triage) [[id number]] Collected:  09/01/18 0730    Specimen:  Culturette from Nares Updated:  09/02/18 0616     Culture MRSA Surveillance Negative for Methicillin Resistant Staph aureus    MRSA culture -  Throat (If not done in triage) [[id number]] Collected:  09/01/18 0730    Specimen:  Culturette from Throat Updated:  09/02/18 0616     Culture MRSA Surveillance Negative for Methicillin Resistant Staph aureus          Imaging, reviewed and are significant for:  Ct Head Wo Contrast    Result Date: 08/17/2018  No acute intracranial abnormality. Eloise Harman, MD  08/17/2018 12:36 AM    X-ray Chest Ap Portable    Result Date: 08/26/2018    No acute findings post ICD placement. Geanie Cooley, MD  08/26/2018 4:25 PM    Xr Chest Ap Portable    Result Date: 08/19/2018  Lines and tubes as described without pneumothorax. Bibasilar atelectasis without focal consolidation identified. Colonel Bald, MD  08/19/2018 12:35 PM    Xr Chest Ap Portable    Result Date: 08/17/2018   Right IJ central venous catheter has been pulled back with tip now at the cavoatrial junction. Shelly Flatten, MD  08/17/2018 6:29 PM    Xr Chest Ap Portable    Result Date: 08/17/2018   1. Right IJ central venous catheter placement with tip in the right atrium. 2. Retrocardiac opacity may represent atelectasis or effusion. Shelly Flatten, MD  08/17/2018 5:28 PM    Chest Ap Portable    Result Date: 08/17/2018   1. ET tube tip 3.2 cm above the carina and gastric drainage tube sidehole in the stomach and tip below the field-of-view. 2. Central vascular congestion, likely upper lobe edema, and bibasilar atelectasis. Infection/aspiration cannot be entirely excluded. 3. Borderline enlarged cardiopericardial silhouette. Eloise Harman, MD  08/17/2018 12:11 AM    US Venous Duplex Doppler Arm Left    Result Date: 09/01/2018  Extensive occlusive and nonocclusive left upper extremity DVT in the region of left pacer wire, as detailed. Per discussion with clinical team at the time of dictation, there are aware of this finding. Janina Mayo, MD  09/01/2018 10:46 AM        Signed by: Dani Gobble, MD

## 2018-09-06 NOTE — UM Notes (Signed)
09/06/2018 CSR    89F with h/o DMII, HTN, NICM EF 20%, recent discharge from [facility] for VF arrest s/p ICD placment, returned 2 days later on 6/19 with new LUE swelling, found to have extensive occlusive and nonocclusive DVTs in the region of L pacer wire, started on [facility] and then RRT called 6/21 for hematemesis, EGD showed nonbleeding duodenal ulcer s/p epi injection and cautery/clipping.  She was monitored in [facility] and downgraded on 6/25.  She was hypotensive and her GDMT was held.    Patient transferred from  [facility] to [facility] 09/05/2018 placed on telemetry and on RA.  Patient remains on Heparin drip with Hematology following. Plan to increase aldactone to 25mg  today if Cr stable.     V/S T-97.5, R-20, O2sat-97 , HR-74, BP-118/69    Labs H/H-7.9/24, Glucose-197, BUN-21, Cr-1.2, Na-135, Calcium-7.8, PTT-94      Exam  Cardiovascular: regular rate and rhythm, normal S1, S2, no S3, no S4, no murmurs, rubs or gallops  Neck: no carotid bruits or JVD  Lungs: clear to auscultation bilateraly, without wheezing, rhonchi, or rales  Abdomen: soft, non-tender, non-distended; no palpable masses,  normoactive bowel sounds  Extremities: no edema    Plan  Per Medicine  Plan to increase aldactone to 25mg  tomorrow if Cr stable.   Cont Coreg.  Cont entresto.   Lipitor increased to 40mg .     Per Cardiology  BP is low and she likely has a persistent vasodilatory state stemming from her critical illness - this should improve with time and as such her cardiomyopathic medications can be uptitrated in the outpatient setting.  Would discharge her on the current low-dose regimen of carvedilol 3.125mg  q12h, Entresto 24/26mg  q12h, and spironolactone 25mg  daily.  Anticoagulation plan per hematology.  We will arrange follow-up in 1-2 weeks - please call w/ any cardiac questions in the interim.    Rosita Kea, BSN  Utilization Review Nurse  [phone]

## 2018-09-06 NOTE — Plan of Care (Addendum)
Admission Assessment:      Admitted from: [facility] [place]  Report received from [facility] spectra #[id number]    Orientation: AOx 4  VSS/other: Room air  Rhythm on tele: AV paced  Ambulation: x1A w/ walker  Lines/Drips: Mod Int Hep gtt. RUA Midline (draws back). 20G R hand  GI/GU: cont x2  Code Status: Full  Fall Score: Mod     Verified patient ID/arm bands. Ensured appropriate safety precautions in place including: assigned fall score interventions; review of known allergies, and special needs; personal items within reach; call bell within reach.     Critical Labs/Images/Procedures:  6/21 EGD: large duodenal ulcer with visible vessels s/p cauterization/clipping  6/21 [place] Dopp LUE: left upper extremity DVT    Comments:   -Left arm restriction. BP on R forearm  - see doc flow for complete assessment and vitals.     Plan:   -Vitals Q4H  -APTT at 0400 on 6/27  -monitor H/H  -IV Protonix BID  -PO Aldactone  -I/O.DW  -Hematology following  -PT/OT rec [facility] PT/OT    Skin Assessment: Cosign Note: Talia D    Skin WNL EXCEPT for:     HEAD:   LUE:   RUE:  Scattered bruising. Firmness around elbow area, hematoma? MD aware  TORSO: Left upper chest pacemaker scar, mid abd scar, LLQ bruising  BACK:  SACRUM: Stage 1 blanchable redness (pt refused Mepilex)  BUTTOCKS/INGUINAL/PERINEAL:  LLE:   RLE:  OTHER:

## 2018-09-07 ENCOUNTER — Inpatient Hospital Stay: Payer: Medicare Other

## 2018-09-07 LAB — BASIC METABOLIC PANEL
Anion Gap: 7 (ref 5.0–15.0)
BUN: 17 mg/dL (ref 7.0–19.0)
CO2: 22 mEq/L (ref 22–29)
Calcium: 8.2 mg/dL (ref 7.9–10.2)
Chloride: 105 mEq/L (ref 100–111)
Creatinine: 1.2 mg/dL — ABNORMAL HIGH (ref 0.6–1.0)
Glucose: 191 mg/dL — ABNORMAL HIGH (ref 70–100)
Potassium: 4 mEq/L (ref 3.5–5.1)
Sodium: 134 mEq/L — ABNORMAL LOW (ref 136–145)

## 2018-09-07 LAB — CBC
Absolute NRBC: 0 10*3/uL (ref 0.00–0.00)
Absolute NRBC: 0 10*3/uL (ref 0.00–0.00)
Hematocrit: 22.7 % — ABNORMAL LOW (ref 34.7–43.7)
Hematocrit: 25.1 % — ABNORMAL LOW (ref 34.7–43.7)
Hgb: 7.7 g/dL — ABNORMAL LOW (ref 11.4–14.8)
Hgb: 8.3 g/dL — ABNORMAL LOW (ref 11.4–14.8)
MCH: 31 pg (ref 25.1–33.5)
MCH: 30.4 pg (ref 25.1–33.5)
MCHC: 33.9 g/dL (ref 31.5–35.8)
MCHC: 33.1 g/dL (ref 31.5–35.8)
MCV: 91.5 fL (ref 78.0–96.0)
MCV: 91.9 fL (ref 78.0–96.0)
MPV: 10.8 fL (ref 8.9–12.5)
MPV: 10 fL (ref 8.9–12.5)
Nucleated RBC: 0 /100 WBC (ref 0.0–0.0)
Nucleated RBC: 0 /100 WBC (ref 0.0–0.0)
Platelets: 273 10*3/uL (ref 142–346)
Platelets: 283 10*3/uL (ref 142–346)
RBC: 2.48 10*6/uL — ABNORMAL LOW (ref 3.90–5.10)
RBC: 2.73 10*6/uL — ABNORMAL LOW (ref 3.90–5.10)
RDW: 13 % (ref 11–15)
RDW: 13 % (ref 11–15)
WBC: 7.48 10*3/uL (ref 3.10–9.50)
WBC: 8.81 10*3/uL (ref 3.10–9.50)

## 2018-09-07 LAB — GLUCOSE WHOLE BLOOD - POCT
Whole Blood Glucose POCT: 202 mg/dL — ABNORMAL HIGH (ref 70–100)
Whole Blood Glucose POCT: 295 mg/dL — ABNORMAL HIGH (ref 70–100)
Whole Blood Glucose POCT: 169 mg/dL — ABNORMAL HIGH (ref 70–100)
Whole Blood Glucose POCT: 216 mg/dL — ABNORMAL HIGH (ref 70–100)

## 2018-09-07 LAB — GFR: EGFR: 43.9

## 2018-09-07 LAB — MAGNESIUM: Magnesium: 1.8 mg/dL (ref 1.6–2.6)

## 2018-09-07 LAB — APTT
PTT: 75 s — ABNORMAL HIGH (ref 23–37)
PTT: 80 s — ABNORMAL HIGH (ref 23–37)

## 2018-09-07 MED ORDER — PANTOPRAZOLE SODIUM 40 MG PO TBEC
40.00 mg | DELAYED_RELEASE_TABLET | Freq: Two times a day (BID) | ORAL | Status: DC
Start: 2018-09-07 — End: 2018-09-08
  Administered 2018-09-07 – 2018-09-08 (×2): 40 mg via ORAL
  Filled 2018-09-07 (×2): qty 1

## 2018-09-07 MED ORDER — ENOXAPARIN SODIUM 60 MG/0.6ML SC SOLN
1.00 mg/kg | Freq: Two times a day (BID) | SUBCUTANEOUS | Status: DC
Start: 2018-09-07 — End: 2018-09-08
  Administered 2018-09-07 – 2018-09-08 (×3): 60 mg via SUBCUTANEOUS
  Filled 2018-09-07 (×4): qty 0.6

## 2018-09-07 NOTE — Plan of Care (Addendum)
Shift Note:      Orientation: AOx 4  Rhythm on tele: AV paced  Oxygen: RA  Ambulation: x1 walker  Pain: controlled  Lines/Drips: R hand  GI/GU: cont  Fall Score: mod    Critical Labs/Imaging/Procedures:   6/27:  - RUE [place] positive for DVT  - Hep d/c, started lovenox    Comments:     Significant Shift Events and Questions for Attending: none    Plan: monitor per unit protocol, track Hb, poss d/c when hemodynamically stable

## 2018-09-07 NOTE — Progress Notes (Signed)
MEDICINE PROGRESS NOTE    Date Time: 09/07/18 8:08 AM  Patient Name: Tara Perry  Attending Physician: Dani Gobble, MD    Assessment:     [phone] with h/o DMII, HTN, NICM EF 20%, recent discharge from [facility] for VF arrest s/p ICD placment, returned 2 days later on 6/19 with new LUE swelling, found to have extensive occlusive and nonocclusive DVTs in the region of L pacer wire, started on [facility] and then RRT called 6/21 for hematemesis, EGD showed nonbleeding duodenal ulcer s/p epi injection and cautery/clipping.  She was monitored in [facility] and downgraded on 6/25.  She was hypotensive and her GDMT was held.  She has been restarted on Coreg, entresto, and Aldactone.    # New LUE DVT associated with pacer wire  # Hematemesis 2/2 duodenal ulcer  # NICM E 20% s/p ICD 08/26/2018  # Chronic LBBB  # DMII  # HTN  # HLD  # Carotid artery stenosis      Plan:     Patient noted swelling around midline, appears to have phlebitis.  [place] RUE shows extensive right basilic occlusive thrombus in associated thrombophlebitis.  D/c midline.  Heme following.  D/c heparin gtt and start therapeutic lovenox as Hgb stable. Plan to transition to eliquis as outpatient per Heme.   Cont aldactone to 25mg , coreg, entresto.  Plan to uptitrate GDMT as outpatient..   Lipitor increased to 40mg .   Possible d/c in a.m if Hgb and Cr stable.    She will go home with daughter and home services.  Case discussed with: patient, daughter    Safety Checklist:     DVT prophylaxis:  CHEST guideline (See page [phone]) Chemical   Foley:  [place] Rn Foley protocol Not present               Reference for approximate charges of common labs: CBC auto diff - $76   BMP - $99   Mg - $79    Lines:     Patient Lines/Drains/Airways Status    Active PICC Line / CVC Line / PIV Line / Drain / Airway / Intraosseous Line / Epidural Line / ART Line / Line / Wound / Pressure Ulcer / NG/OG Tube     Name:   Placement date:   Placement time:   Site:   Days:    Peripheral IV 09/01/18  Anterior;Right Hand   09/01/18    --    Hand   4    Midline IV 09/03/18 Anterior;Right Upper Arm   09/03/18    1457    Upper Arm   1    Wound 08/26/18 Surgical Incision Chest Left BIV ICD IMPLANT   08/26/18    1401    Chest   9    Wound 09/02/18 Pressure Injury Stage 1 Coccyx   09/02/18    2000    Coccyx   2                 Disposition: (Please see PAF column for Expected D/C Date)   Today's date: 09/07/2018   Admit Date: 08/30/2018  5:23 PM   LOS: 8  Clinical Milestones: transition to [facility]   Anticipated discharge needs: Home services      Subjective     CC: Arm DVT (deep venous thromboembolism), acute, left    Interval History/24 hour events: Transfer out of [facility] yesterday.  HPI/Subjective: Denies SOB.  Feels arm swelling is improving.    Review of Systems:  As per HPI    Physical Exam:     VITAL SIGNS PHYSICAL EXAM   Temp:  [97.4 F (36.3 C)-98.1 F (36.7 C)] 97.4 F (36.3 C)  Heart Rate:  [64-78] 68  Resp Rate:  [6-20] 16  BP: (98-118)/(51-69) 100/61        Intake/Output Summary (Last 24 hours) at 09/07/2018 1610  Last data filed at 09/06/2018 1220  Gross per 24 hour   Intake 375 ml   Output --   Net 375 ml    Physical Exam  General: awake, alert   Cardiovascular: regular rate and rhythm, no murmurs, rubs or gallops  Lungs: clear to auscultation bilaterally, without wheezing, rhonchi, or rales  Abdomen: soft, non-tender, non-distended; no palpable masses,  normoactive bowel sounds  Extremities: no edema   L arm swelling>R         Meds:     Medications were reviewed:  Current Facility-Administered Medications   Medication Dose Route Frequency    atorvastatin  40 mg Oral Daily    carvedilol  3.125 mg Oral Q12H SCH    insulin lispro  1-6 Units Subcutaneous QHS    insulin lispro  2-10 Units Subcutaneous TID AC    pantoprazole  40 mg Intravenous BID    sacubitril-valsartan  24-26 mg Oral Q12H SCH    spironolactone  25 mg Oral Daily    vitamin B-12  1,000 mcg Oral Daily     Current Facility-Administered  Medications   Medication Dose Route Frequency Last Rate    heparin infusion 25,000 units/500 mL (VTE/Moderate Intensity)  18 Units/kg/hr Intravenous Continuous 11 Units/kg/hr (09/06/18 0002)     Current Facility-Administered Medications   Medication Dose Route    acetaminophen  650 mg Oral    calcium carbonate  500 mg Oral    dextrose  15 g of glucose Oral    And    dextrose  12.5 g Intravenous    And    glucagon (rDNA)  1 mg Intramuscular    naloxone  0.2 mg Intravenous    ondansetron  4 mg Oral    Or    ondansetron  4 mg Intravenous         Labs:     Labs (last 72 hours):    Recent Labs   Lab 09/07/18  0428 09/06/18  1446   WBC 7.48 7.69   Hgb 7.7* 8.2*   Hematocrit 22.7* 23.9*   Platelets 273 258       Recent Labs   Lab 09/07/18  0428 09/06/18  0407  09/01/18  0621   PT  --   --   --  14.7   PT INR  --   --   --  1.2*   PTT 75* 94*  More results in Results Review 33   More results in Results Review = values in this interval not displayed.    Recent Labs   Lab 09/07/18  0428 09/06/18  0407   Sodium 134* 135*   Potassium 4.0 3.7   Chloride 105 104   CO2 22 23   BUN 17.0 21.0*   Creatinine 1.2* 1.2*   Calcium 8.2 7.8*   Glucose 191* 197*                   Microbiology, reviewed and are significant for:  Microbiology Results     Procedure Component Value Units Date/Time    MRSA culture - Nares (If not done in triage) [[id number]]  Collected:  09/01/18 0730    Specimen:  Culturette from Nares Updated:  09/02/18 0616     Culture MRSA Surveillance Negative for Methicillin Resistant Staph aureus    MRSA culture - Throat (If not done in triage) [[id number]] Collected:  09/01/18 0730    Specimen:  Culturette from Throat Updated:  09/02/18 0616     Culture MRSA Surveillance Negative for Methicillin Resistant Staph aureus          Imaging, reviewed and are significant for:  Ct Head Wo Contrast    Result Date: 08/17/2018  No acute intracranial abnormality. Eloise Harman, MD  08/17/2018 12:36 AM    X-ray Chest Ap  Portable    Result Date: 08/26/2018    No acute findings post ICD placement. Geanie Cooley, MD  08/26/2018 4:25 PM    Xr Chest Ap Portable    Result Date: 08/19/2018  Lines and tubes as described without pneumothorax. Bibasilar atelectasis without focal consolidation identified. Colonel Bald, MD  08/19/2018 12:35 PM    Xr Chest Ap Portable    Result Date: 08/17/2018   Right IJ central venous catheter has been pulled back with tip now at the cavoatrial junction. Shelly Flatten, MD  08/17/2018 6:29 PM    Xr Chest Ap Portable    Result Date: 08/17/2018   1. Right IJ central venous catheter placement with tip in the right atrium. 2. Retrocardiac opacity may represent atelectasis or effusion. Shelly Flatten, MD  08/17/2018 5:28 PM    Chest Ap Portable    Result Date: 08/17/2018   1. ET tube tip 3.2 cm above the carina and gastric drainage tube sidehole in the stomach and tip below the field-of-view. 2. Central vascular congestion, likely upper lobe edema, and bibasilar atelectasis. Infection/aspiration cannot be entirely excluded. 3. Borderline enlarged cardiopericardial silhouette. Eloise Harman, MD  08/17/2018 12:11 AM    US Venous Duplex Doppler Arm Left    Result Date: 09/01/2018  Extensive occlusive and nonocclusive left upper extremity DVT in the region of left pacer wire, as detailed. Per discussion with clinical team at the time of dictation, there are aware of this finding. Janina Mayo, MD  09/01/2018 10:46 AM        Signed by: Dani Gobble, MD

## 2018-09-07 NOTE — Progress Notes (Signed)
RN transport   I took Tara Perry to [place] for a R arm doppler, she was transported on a heparin gtt verified in epic. She ambulated to and from  [employer] she was NSR no ectopy. We completed the imaging I brought pt back to CTUS 372 and gave bedside handoff to [facility] Flenner Deer pt was on telemetry as I left room.

## 2018-09-07 NOTE — Plan of Care (Addendum)
Shift Note:      Orientation: AOx 4  Rhythm on tele: AV paced  Oxygen: RA  Ambulation:  x1 assist w/walker  Pain: c/o chest soreness from CPR. PRN Tylenol given w/ relief  Lines/Drips: RUA Midline, Mod Intensity Heparin  GI/GU: cont x2  Fall Score: Mod     Critical Labs/Imaging/Procedures:   6/21 EGD: large duodenal ulcer with visible vessels s/p cauterization/clipping  6/21 [place] Dopp LUE: left upper extremity DVT     Comments:   - L arm DVT- limb restriction- BP R forearm  - see doc flow for complete assessment and vitals.      Significant Shift Events and Questions for Attending:     Plan:   -Vitals Q4H   -APTT at 0400 on 6/28  -monitor H/H  -IV Protonix BID  -PO Aldactone  -I/O.DW  -Hematology following  -PT/OT rec [facility] PT/OT  -RUE ultrasound ordered  -Per Dr. Penni Homans, pos D/C 6/27 if H/H is stable

## 2018-09-08 LAB — CBC
Absolute NRBC: 0 10*3/uL (ref 0.00–0.00)
Hematocrit: 23.5 % — ABNORMAL LOW (ref 34.7–43.7)
Hgb: 8 g/dL — ABNORMAL LOW (ref 11.4–14.8)
MCH: 31.3 pg (ref 25.1–33.5)
MCHC: 34 g/dL (ref 31.5–35.8)
MCV: 91.8 fL (ref 78.0–96.0)
MPV: 10.8 fL (ref 8.9–12.5)
Nucleated RBC: 0 /100 WBC (ref 0.0–0.0)
Platelets: 257 10*3/uL (ref 142–346)
RBC: 2.56 10*6/uL — ABNORMAL LOW (ref 3.90–5.10)
RDW: 14 % (ref 11–15)
WBC: 6.78 10*3/uL (ref 3.10–9.50)

## 2018-09-08 LAB — GFR: EGFR: 43.9

## 2018-09-08 LAB — BASIC METABOLIC PANEL
Anion Gap: 9 (ref 5.0–15.0)
BUN: 14 mg/dL (ref 7.0–19.0)
CO2: 22 mEq/L (ref 22–29)
Calcium: 8.2 mg/dL (ref 7.9–10.2)
Chloride: 107 mEq/L (ref 100–111)
Creatinine: 1.2 mg/dL — ABNORMAL HIGH (ref 0.6–1.0)
Glucose: 172 mg/dL — ABNORMAL HIGH (ref 70–100)
Potassium: 4.2 mEq/L (ref 3.5–5.1)
Sodium: 138 mEq/L (ref 136–145)

## 2018-09-08 LAB — MAGNESIUM: Magnesium: 1.7 mg/dL (ref 1.6–2.6)

## 2018-09-08 LAB — PT/INR
PT INR: 1.2 — ABNORMAL HIGH (ref 0.9–1.1)
PT: 15.3 s — ABNORMAL HIGH (ref 12.6–15.0)

## 2018-09-08 LAB — GLUCOSE WHOLE BLOOD - POCT
Whole Blood Glucose POCT: 211 mg/dL — ABNORMAL HIGH (ref 70–100)
Whole Blood Glucose POCT: 320 mg/dL — ABNORMAL HIGH (ref 70–100)

## 2018-09-08 LAB — APTT: PTT: 43 s — ABNORMAL HIGH (ref 23–37)

## 2018-09-08 MED ORDER — ENOXAPARIN SODIUM 60 MG/0.6ML SC SOLN
1.00 mg/kg | Freq: Two times a day (BID) | SUBCUTANEOUS | 3 refills | Status: DC
Start: 2018-09-08 — End: 2018-10-11

## 2018-09-08 MED ORDER — CARVEDILOL 3.125 MG PO TABS
3.1250 mg | ORAL_TABLET | Freq: Two times a day (BID) | ORAL | 1 refills | Status: AC
Start: 2018-09-08 — End: 2018-10-08

## 2018-09-08 MED ORDER — ATORVASTATIN CALCIUM 40 MG PO TABS
40.0000 mg | ORAL_TABLET | Freq: Every day | ORAL | 1 refills | Status: DC
Start: 2018-09-09 — End: 2018-10-09

## 2018-09-08 MED ORDER — PANTOPRAZOLE SODIUM 40 MG PO TBEC
40.00 mg | DELAYED_RELEASE_TABLET | Freq: Two times a day (BID) | ORAL | 2 refills | Status: AC
Start: 2018-09-08 — End: 2018-10-08

## 2018-09-08 MED ORDER — SACUBITRIL-VALSARTAN 24-26 MG PO TABS
1.0000 | ORAL_TABLET | Freq: Two times a day (BID) | ORAL | 1 refills | Status: DC
Start: 2018-09-08 — End: 2018-10-08

## 2018-09-08 MED ORDER — MAGNESIUM OXIDE 400 MG TABS (WRAP)
400.00 mg | ORAL_TABLET | Freq: Once | ORAL | Status: AC
Start: 2018-09-08 — End: 2018-09-08
  Administered 2018-09-08: 12:00:00 400 mg via ORAL
  Filled 2018-09-08: qty 1

## 2018-09-08 NOTE — Discharge Instructions (Signed)
Enoxaparin injection  Brand Name: Lovenox  What is this medicine?  ENOXAPARIN (ee nox a PA rin) is used after knee, hip, or abdominal surgeries to prevent blood clotting. It is also used to treat existing blood clots in the lungs or in the veins.  How should I use this medicine?  This medicine is for injection under the skin. It is usually given by a health-care professional. You or a family member may be trained on how to give the injections. If you are to give yourself injections, make sure you understand how to use the syringe, measure the dose if necessary, and give the injection. To avoid bruising, do not rub the site where this medicine has been injected. Do not take your medicine more often than directed. Do not stop taking except on the advice of your doctor or health care professional.  Make sure you receive a puncture-resistant container to dispose of the needles and syringes once you have finished with them. Do not reuse these items. Return the container to your doctor or health care professional for proper disposal.  Talk to your pediatrician regarding the use of this medicine in children. Special care may be needed.  What side effects may I notice from receiving this medicine?  Side effects that you should report to your doctor or health care professional as soon as possible:   allergic reactions like skin rash, itching or hives, swelling of the face, lips, or tongue   feeling faint or lightheaded, falls   signs and symptoms of bleeding such as bloody or black, tarry stools; red or dark-[place] urine; spitting up blood or [place] material that looks like coffee grounds; red spots on the skin; unusual bruising or bleeding from the eye, gums, or nose  Side effects that usually do not require medical attention (report to your doctor or health care professional if they continue or are bothersome):   pain, redness, or irritation at site where injected  What may interact with this medicine?     aspirin and  aspirin-like medicines   certain medicines that treat or prevent blood clots   dipyridamole   NSAIDs, medicines for pain and inflammation, like ibuprofen or naproxen  What if I miss a dose?  If you miss a dose, take it as soon as you can. If it is almost time for your next dose, take only that dose. Do not take double or extra doses.  Where should I keep my medicine?  Keep out of the reach of children.  Store at room temperature between 15 and 30 degrees C (59 and 86 degrees F). Do not freeze. If your injections have been specially prepared, you may need to store them in the refrigerator. Ask your pharmacist. Throw away any unused medicine after the expiration date.  What should I tell my health care provider before I take this medicine?  They need to know if you have any of these conditions:   bleeding disorders, hemorrhage, or hemophilia   infection of the heart or heart valves   kidney or liver disease   previous stroke   prosthetic heart valve   recent surgery or delivery of a baby   ulcer in the stomach or intestine, diverticulitis, or other bowel disease   an unusual or allergic reaction to enoxaparin, heparin, pork or pork products, other medicines, foods, dyes, or preservatives   pregnant or trying to get pregnant   breast-feeding  What should I watch for while using this medicine?  Visit   your doctor or health care professional for regular checks on your progress. Your condition will be monitored carefully while you are receiving this medicine.  Notify your doctor or health care professional and seek emergency treatment if you develop breathing problems; changes in vision; chest pain; severe, sudden headache; pain, swelling, warmth in the leg; trouble speaking; sudden numbness or weakness of the face, arm, or leg. These can be signs that your condition has gotten worse.  If you are going to have surgery, tell your doctor or health care professional that you are taking this medicine.  Do not stop  taking this medicine without first talking to your doctor. Be sure to refill your prescription before you run out of medicine.  Avoid sports and activities that might cause injury while you are using this medicine. Severe falls or injuries can cause unseen bleeding. Be careful when using sharp tools or knives. Consider using an electric razor. Take special care brushing or flossing your teeth. Report any injuries, bruising, or red spots on the skin to your doctor or health care professional.  NOTE:This sheet is a summary. It may not cover all possible information. If you have questions about this medicine, talk to your doctor, pharmacist, or health care provider. Copyright 2020 [employer]

## 2018-09-08 NOTE — Discharge Instr - AVS First Page (Addendum)
Reason for your Hospital Admission:  # New LUE DVT associated with pacer wire  # RUE phlebitis and superficial thrombosis around midline  # Hematemesis 2/2 duodenal ulcer      Instructions for after your discharge:  Have your blood work, CBC, checked in 2-3 days to monitor your Anemia while on Lovenox injections  Make an appointment with Dr. Zollie Pee above to determine when to change from Lovenox to Eliquis  Follow up with your PCP to have blood work done and reviewed in 2-3 days  Follow up with Dr. Nash Shearer as your heart medication dosages were decreased.

## 2018-09-08 NOTE — Progress Notes (Signed)
Tara Perry is a 73 y.o. female    [facility] services.  PACC, [id number] informed.    Family to take patient home.       09/08/18 1104   Discharge Disposition   Patient preference/choice provided? Yes   Physical Discharge Disposition Home, Home Health   Name of Home Health Agency Placement [facility]   Mode of Transportation Car   Patient/Family/POA notified of transfer plan Yes   Patient agreeable to discharge plan/expected d/c date? Yes   Family/POA agreeable to discharge plan/expected d/c date? Yes   Bedside nurse notified of transport plan? Yes   Outpatient Services   Home Health Skilled Nursing;Home PT/OT/ST   CM Interventions   Follow up appointment scheduled? No   Reason no follow up scheduled? Family to schedule   Referral made for home health RN visit? Yes   Multidisciplinary rounds/family meeting before d/c? Yes   Medicare Checklist   Is this a Medicare patient? Yes   Patient received 1st IMM Letter? Yes   If LOS 3 days or greater, did patient received 2nd IMM Letter? Yes     Milana Kidney, RN, MSN  Case Management Department  Phone: [phone]

## 2018-09-08 NOTE — Progress Notes (Addendum)
Home Health Referral    COVID19 tests NEGATIVE X 2 on 08/26/18 and 08/17/18                                                                            Referral from (Case Manager) for home health care upon discharge.    By [employer], the patient has the right to freely choose a home care provider.  Arrangements have been made with:     A company of the patients choosing. We have supplied the patient with a listing of providers in your area who asked to be included and participate in Medicare.   [facility], formerly [facility], a home care agency that provides adult home care services and participates in Medicare   The preferred provider of your insurance company. Choosing a home care provider other than your insurance company's preferred provider may affect your insurance coverage.    Home Health Discharge Information     Your doctor has ordered Skilled Nursing, Physical Therapy, Occupational Therapy, Speech and Language Therapy and Home Health Aide in-home service(s) for you while you recuperate at home, to assist you in the transition from hospital to home.    The agency that you or your representative chose to provide the service:    Name of Home Health Agency Placement: [facility]]  [phone]    The above services were set up by:    Fara Chute for Jason Fila  [facility] Health Liaison)  Phone  [phone]    IF YOU HAVE NOT HEARD FROM YOUR HOME YOUR HOME HEALTH AGENCY WITHIN 24-48 HOURS AFTER DISCHARGE PLEASE CALL YOUR AGENCY TO ARRANGE A TIME FOR YOUR FIRST VISIT. FOR ANY SCHEDULING CONCERNS OR QUESTIONS RELATED TO HOME HEALTH, SUCH AS TIME OR DATE PLEASE CONTACT YOUR HOME HEALTH AGENCY AT THE NUMBER LISTED ABOVE.      HOME HEALTH REFERRAL    COVID19 tests NEGATIVE X 2 on 08/26/18 and 08/17/18    PATIENT"S DEMOGRAPHICS:    Name: Tara Perry    Discharge Address: [address]  [place] [address]    Primary Telephone Number:  Landline:    [phone]    Secondary Telephone Number: [phone], sister, [name]'s cell    Emergency Contact and Number: Extended Emergency Contact Information  Primary Emergency Contact: [name],[name]  Address: [address]           [place], [place] [address] [place]  Home Phone: [phone]  Mobile Phone: [phone]  Relation: Daughte    Ordering Physician:  Tyrone Schimke MD    The community PCP that will follow the patient after Discharge from the Hospital is:  Following Physician:    PCP: Kennon Holter, MD, [phone]    alt PCP MD if Verlin Dike MD is not available is: PCP: Tammi Sou, MD, [phone]     Language/Communication Barrier:    NO    Primary Diagnosis and Reason for Services:    PRIMARY PROBLEM ICD-10 CODE for HOME HEALTH is:    Arm DVT (deep venous thromboembolism), acute, left [I82.622]     Cardiac arrest   BiV ICD placed on 08/26/2018     Home  nursing required for skilled assessment including cardiopulmonary assessment and dietary education for disease management, and medication instruction. Home PT/OT required for gait and balance training, strengthening, mobility, fall prevention, and ADL training. Home SLP required for swallowing techniques and aspiration prevention.                 Additional Comments:   Resumption of care     SOC confirmed, no Scheduler call is required     COVID19 tests NEGATIVE X 2 on 08/26/18 and 08/17/18    Questions Relating to COVID-19:    1. Have you been out of the country in the past 2 weeks?   NO  2. Do you have any upper respiratory symptoms (cough, SOB, fever)?    NO  3. Have you been exposed to anyone with COVID-19 virus?   NO    Discharge Date: 09/08/2018    Referral Source (PACC/Hospital/Unit):    Fara Chute for Jason Fila  [facility] Health Liaison)   Phone  [phone]    Referral Date: 09/08/18    Home Health face-to-face (FTF) Encounter (Order [id number])   Consult   Date: 09/08/2018 [facility]  Ordering/Authorizing: Tyrone Schimke, MD   Order Information     Order Date/Time Release Date/Time Start Date/Time End Date/Time   09/08/18 10:13 PM None 09/08/18 01:34 PM 09/08/18 01:34 PM   Order Details     Frequency Duration Priority Order Class   Once 1 occurrence Routine Hospital Performed   Standing Order Information     Remaining Occurrences Interval Last Released     0/1 Once 09/08/2018           Provider Information     Ordering User Ordering Provider Authorizing Provider   Cullen Vanallen, De Nurse, RN Tyrone Schimke, MD Tyrone Schimke, MD   Attending Provider(s) Admitting Provider PCP   Harmon Dun, MD; Olin Pia, DO; Jaci Standard, MD; Jetta Lout, MD; Julieanne Manson, MD; Lianne Moris, MD; Al-Ghandour, Roni Bread, MD; Dani Gobble, MD; Boykin Peek, MD; Phylliss Blakes, MD; Tyrone Schimke, MD Olin Pia, DO Tammi Sou, MD   Verbal Order Info     Action Created on Order Mode Entered by Responsible Provider Signed by Signed on   Ordering 09/08/18 2213 Telephone with Zadie Cleverly, RN Tyrone Schimke, MD     Comments     HOME HEALTH VISITS:  SN / PT / OT / ST - RESUMPTION OF CARE:  Primary Diagnosis and Reason for Services:  PRIMARY PROBLEM ICD-10 CODE for HOME HEALTH is: Arm DVT (deep venous thromboembolism), acute, left [I82.622] - Cardiac arrest - BiV ICD placed on 08/26/2018 - Home nursing required for skilled assessment including cardiopulmonary assessment and dietary education for disease management, and medication instruction. Home PT/OT required for gait and balance training, strengthening, mobility, fall prevention, and ADL training. Home SLP required for swallowing techniques and aspiration prevention.     The community PCP that will follow the patient after Discharge from the Hospital is: Following Physician:  PCP: Kennon Holter, MD, [phone] - alt PCP MD if Verlin Dike MD is not available is: PCP:  Tammi Sou, MD, [phone]    HPI: DISCHARGE DAY    Date of Admission: 08/30/2018    Date of Discharge: 09/08/2018       Date Time: 09/08/18 11:50 AM    Patient Name: Tara Perry    Attending Physician: Tyrone Schimke, MD  Discharge Diagnoses:   Principal Diagnosis:     # New LUE DVT associated with pacer wire  # Hematemesis 2/2 duodenal ulcer  # NICM EF 20% s/p ICD 08/26/2018  # Chronic LBBB  # DMII  # HTN  # HLD  # Carotid artery stenosis      Hospital Course      Reason for Admission/HPI:     73 y.o.femalewith recent hospitalization for cardiac arrest and resultant cardiomyopathy presents to the emergency room with acute episode of left arm swelling and discoloration this morning. The patient and the son noticed that around 8:00 in the morning and proceeded to contact the cardiology office for advice. They were advised to get the ultrasound done and was subsequently diagnosed with left upper extremity DVT. The patient is in no pain and has been recovering well at home and the incision has been clean, dry, with no drainage or bleeding. The son and the patient does not endorse any change of temperature in the arms although 1 emergency room team member did document a difference of temperatures earlier. The patient and her son did note a dark in coloration on the left arm compared to the right. Other than that the patient reports regular bowel movements. She still has some soreness in the upper abdominal area presumably secondary to resuscitation efforts during her cardiac arrest. The patient reports normal urination and thought that the color was also normal. When asked further regarding her intake, they did admit that perhaps they have not been taking in as much as they are to in terms of fluid and food.    For summary of the patient's hospital course, please refer to discharge summary dated August 27, 2018. The patient was discharged 3 days ago. They have noted no  changes in medications since discharge. The blood pressure has been largely normal except for this morning during the process of getting advice with the cardiologist office, she did have one episode of blood pressure with systolic in the 70s was some dizziness but subsequently has recovered and has not had hypotension prior to today.     Hospital Course     28F with h/o DMII, HTN, NICM EF 20%, recent discharge from [facility] for VF arrest s/p ICD placment, returned 2 days later on 6/19 with new LUE swelling, found to have extensive occlusive and nonocclusive DVTs in the region of L pacer wire, started on [facility] and then RRT called 6/21 for hematemesis, EGD showed nonbleeding duodenal ulcer s/p epi injection and cautery/clipping. She was monitored in [facility] and downgraded on 6/25. She was hypotensive and her GDMT was held. She has been restarted on Coreg, entresto, and Aldactone at lower doses. She will be discharged with Lovenox BID and if stable Hb outpatient changed to Eliquis after meeting with Heme Dr. Zollie Pee. On 6/27 she had RUE erythema around midline sight which showed thrombus formation and thrombophlebitis. Improved after removal of midline. She is already on [facility]. Stable for discharge with Hb 8.0 and no melena.         Order Questions     Question Answer Comment   Date of face-to-face (FTF) encounter: 09/08/2018    Medical conditions that necessitate Home Health care: D. Chronic illness & risk for re-hospitalization due to unstable disease status    Clinical findings that support the need for Skilled Nursing. SN will: C. Monitor for signs and symptoms of exacerbation of disease and management     D. Review medication reconciliation, manage and  educate on use and side effects     G. Educate on new diagnosis, treatment & management to prevent re-hospitalization     H. Assess cardiopulmonary status and monitor for signs &symptoms of exacerbation    Clinical findings that support the need for Physical Therapy. PT will  A. Evaluate and treat functional impairment and improve mobility    Clinical findings support the need for OT (needs SN/PT order).OT will A. Develop in home program to improve ability to perform ADLs    Clinical findings that support the need for SLP. ST will A. Evaluate & treat speech, language, cognitive, communication & swallowing impair    Per clinical findings, following services are medically necessary: Skilled Nursing     PT     OT    Evidence this patient is homebound because: C. Decreased endurance, strength, ROM, cadence, safety/judgment during mobility    Other (please specify) HOME HEALTH VISITS:  SN / PT / OT / ST - RESUMPTION OF CARE:  Primary Diagnosis and Reason for Services:  PRIMARY PROBLEM ICD-10 CODE for HOME HEALTH is: Arm DVT (deep venous thromboembolism), acute, left [I82.622] - Cardiac arrest - BiV ICD place          Process Instructions     Please select Home Care Services medically necessary.     Based on the above findings, I certify that this patient is confined to the home and needs intermittent skilled nursing care, physical therapry and / or speech therapy or continues to need occupational therapy. The patient is under my care, and I have initiated the establishment of the plan of care. This patient will be followed by a physician who will periodically review the plan of care.    Collection Information     Consult Order Info     ID Description Priority Start Date Start Time   [id number] Home Health face-to-face (FTF) Encounter Routine 09/08/2018 1:34 PM   Provider Specialty Referred to   ______________________________________ _____________________________________   Acknowledgement Info     For At Acknowledged By Acknowledged On   Placing Order 09/08/18 2213 Fara Chute, RN 09/08/18 2213   Verbal Order Info     Action Created on Order Mode Entered by Responsible Provider Signed by Signed on   Ordering 09/08/18 2213 Telephone with Zadie Cleverly, RN Tyrone Schimke, MD     Patient Information     Patient Name  Tara Perry, Tara Perry Sex  Female DOB  Mar 12, 1946   Additional Information     Associated Reports External References   Priority and Order Details [employer]

## 2018-09-08 NOTE — Plan of Care (Signed)
Shift Note: CTUS      Orientation: AOx 4  Rhythm on tele: AV paced  Oxygen: RA  Ambulation: 1 assist with walker   Pain: Reports 5/10 headache, relieved with PRN Tylenol   Lines/Drips: PIV 20G R hand  GI/GU: Continent x2  Fall Score: Moderate    Critical Labs/Imaging/Procedures:   - 6/27 RUE [place]: positive for DVT  - 6/21 EGD: showed 2 duodenal ulcers, cauterized and clipped     Comments:    - see doc flow for complete assessment and vitals.       Plan:   - Vitals Q4H   - Monitor H/H  - Pain control   - PO aldactone  - I&Os / DW   - Hemo following  - D/C 6/28       Braden Score: 19

## 2018-09-08 NOTE — Plan of Care (Signed)
Discharge Note: CTUS    Discharge location: Home     Discharge instructions, follow up, and medications reviewed and explained to patient with family at bedside - questions answered stated understanding.     Prescriptions: N/A      IV and tele removed. All belongings with patient. VSS.   Pt transported off of unit via wheelchair at 1330 PM.

## 2018-09-08 NOTE — Nursing Progress Note (Signed)
Shift Note:      Orientation: AOx 4  Rhythm on tele: AV paced  Oxygen: RA  Ambulation: x1 with walker   Pain: Controlled  Lines/Drips: 20G Rt hand  GI/GU: Cont x2  Fall Score: Moderate    Critical Labs/Imaging/Procedures:   6/27:  - RUE [place] positive for DVT  - Hep d/c, started lovenox    Comments:    - see doc flow for complete assessment and vitals.     Significant Shift Events and Questions for Attending:    Plan:   - Vitals Q4H   - Track / Monitor Hb  - Possible discharge when hemodynamically stable       Braden Score: 19

## 2018-09-08 NOTE — Discharge Summary (Signed)
Hospitalist DISCHARGE SUMMARY  [facility]   Department of Medicine  24/7 on-call pager: [phone] (8-HOSP)   Main Office: [phone]   Main Fax: [phone]                Date of Admission: 08/30/2018  Date of Discharge: 09/08/2018        Date Time: 09/08/18 11:50 AM  Patient Name: Tara Perry  Attending Physician: Tyrone Schimke, MD  Primary Care Provider: Tammi Sou, MD        Discharge Diagnoses:    Principal Diagnosis:       # New LUE DVT associated with pacer wire  # Hematemesis 2/2 duodenal ulcer  # NICM EF 20% s/p ICD 08/26/2018  # Chronic LBBB  # DMII  # HTN  # HLD  # Carotid artery stenosis            Hospital Course        Reason for Admission/HPI:    73 y.o. female with recent hospitalization for cardiac arrest and resultant cardiomyopathy presents to the emergency room with acute episode of left arm swelling and discoloration this morning.  The patient and the son noticed that around 8:00 in the morning and proceeded to contact the cardiology office for advice.  They were advised to get the ultrasound done and was subsequently diagnosed with left upper extremity DVT.  The patient is in no pain and has been recovering well at home and the incision has been clean, dry, with no drainage or bleeding.  The son and the patient does not endorse any change of temperature in the arms although 1 emergency room team member did document a difference of temperatures earlier.  The patient and her son did note a dark in coloration on the left arm compared to the right.  Other than that the patient reports regular bowel movements.  She still has some soreness in the upper abdominal area presumably secondary to resuscitation efforts during her cardiac arrest.  The patient reports normal urination and thought that the color was also normal.  When asked further regarding her intake, they did admit that perhaps they have not been taking in as much as they are to in terms of fluid  and food.    For summary of the patient's hospital course, please refer to discharge summary dated August 27, 2018.  The patient was discharged 3 days ago.  They have noted no changes in medications since discharge.  The blood pressure has been largely normal except for this morning during the process of getting advice with the cardiologist office, she did have one episode of blood pressure with systolic in the 70s was some dizziness but subsequently has recovered and has not had hypotension prior to today.      Hospital Course:    42F with h/o DMII, HTN, NICM EF 20%, recent discharge from [facility] for VF arrest s/p ICD placment, returned 2 days later on 6/19 with new LUE swelling, found to have extensive occlusive and nonocclusive DVTs in the region of L pacer wire, started on [facility] and then RRT called 6/21 for hematemesis, EGD showed nonbleeding duodenal ulcer s/p epi injection and cautery/clipping.  She was monitored in [facility] and downgraded on 6/25.  She was hypotensive and her GDMT was held.  She has been restarted on Coreg, entresto, and Aldactone at lower doses. She will be discharged with Lovenox BID and if stable Hb outpatient changed to Eliquis after meeting with Heme Dr.  Bade. On 6/27 she had RUE erythema around midline sight which showed thrombus formation and thrombophlebitis. Improved after removal of midline. She is already on [facility]. Stable for discharge with Hb 8.0 and no melena.             Discharge Day Exam:  Temp:  [97.5 F (36.4 C)-98.1 F (36.7 C)] 98 F (36.7 C)  Heart Rate:  [64-76] 70  Resp Rate:  [16-18] 18  BP: (104-125)/(55-69) 109/55  General appearance: alert and oriented x3, no acute distress  HEENT: normocephalic atraumatic, PERRLA, EOMI, moist mucous membranes   Neck:  supple  Lungs:  clear to auscultation bilaterally, no wheezes, rubs, or rhonchi  Heart:   normal S1, S2, no murmurs rubs or gallops  Abdomen:  soft, nontender, nondistended, no rebound or guarding  Back:  no spinal tenderness   Extremities: RUE mild edema and erythema  Skin:  no rash  Neurologic:  normal mental status, CNs II-XII intact, 5/5 strength throughout, normal sensation      Consultations:  Treatment Team:   Attending Provider: Tyrone Schimke, MD  Consulting Physician: Lestine Mount, MD  Consulting Physician: Tyrone Schimke, MD          RESULTS        Recent Lab Results:    Results     Procedure Component Value Units Date/Time    Glucose Whole Blood - POCT [[id number]]  (Abnormal) Collected:  09/08/18 0750     Updated:  09/08/18 0756     Whole Blood Glucose POCT 211 mg/dL     Basic Metabolic Panel [[id number]]  (Abnormal) Collected:  09/08/18 0315    Specimen:  Blood Updated:  09/08/18 0526     Glucose 172 mg/dL      BUN 78.2 mg/dL      Creatinine 1.2 mg/dL      Calcium 8.2 mg/dL      Sodium 956 mEq/L      Potassium 4.2 mEq/L      Chloride 107 mEq/L      CO2 22 mEq/L      Anion Gap 9.0    Magnesium [[id number]] Collected:  09/08/18 0315    Specimen:  Blood Updated:  09/08/18 0526     Magnesium 1.7 mg/dL     GFR [[id number]] Collected:  09/08/18 0315     Updated:  09/08/18 0526     EGFR 43.9    APTT [[id number]]  (Abnormal) Collected:  09/08/18 0315     Updated:  09/08/18 0445     PTT 43 sec     Prothrombin time/INR [[id number]]  (Abnormal) Collected:  09/08/18 0315    Specimen:  Blood Updated:  09/08/18 0445     PT 15.3 sec      PT INR 1.2    CBC without differential [[id number]]  (Abnormal) Collected:  09/08/18 0315    Specimen:  Blood Updated:  09/08/18 0420     WBC 6.78 x10 3/uL      Hgb 8.0 g/dL      Hematocrit 63.8 %      Platelets 257 x10 3/uL      RBC 2.56 x10 6/uL      MCV 91.8 fL      MCH 31.3 pg      MCHC 34.0 g/dL      RDW 14 %      MPV 10.8 fL      Nucleated RBC 0.0 /100 WBC      Absolute NRBC 0.00 x10  3/uL     Glucose Whole Blood - POCT [[id number]]  (Abnormal) Collected:  09/07/18 2059     Updated:  09/07/18 2104     Whole Blood Glucose POCT 216 mg/dL     Glucose Whole Blood - POCT [[id number]]  (Abnormal) Collected:   09/07/18 1641     Updated:  09/07/18 1643     Whole Blood Glucose POCT 169 mg/dL     Glucose Whole Blood - POCT [[id number]]  (Abnormal) Collected:  09/07/18 1200     Updated:  09/07/18 1204     Whole Blood Glucose POCT 295 mg/dL     APTT [[id number]]  (Abnormal) Collected:  09/07/18 1103     Updated:  09/07/18 1135     PTT 80 sec     Narrative:       Obtain baseline aPTT prior to heparin initiation if not drawn  previously. Do not wait for aPTT result prior to heparin  initiation. When therapeutic range is reached per protocol,  change aPTT frequency to daily at [facility] daily at 0400 until  heparin is discontinued, except for ICU patients - continue  q8h aPTTs    CBC without differential [[id number]]  (Abnormal) Collected:  09/07/18 1103    Specimen:  Blood Updated:  09/07/18 1119     WBC 8.81 x10 3/uL      Hgb 8.3 g/dL      Hematocrit 24.4 %      Platelets 283 x10 3/uL      RBC 2.73 x10 6/uL      MCV 91.9 fL      MCH 30.4 pg      MCHC 33.1 g/dL      RDW 13 %      MPV 10.0 fL      Nucleated RBC 0.0 /100 WBC      Absolute NRBC 0.00 x10 3/uL     Glucose Whole Blood - POCT [[id number]]  (Abnormal) Collected:  09/07/18 0708     Updated:  09/07/18 0710     Whole Blood Glucose POCT 202 mg/dL     APTT [[id number]]  (Abnormal) Collected:  09/07/18 0428     Updated:  09/07/18 0537     PTT 75 sec     Narrative:       Obtain baseline aPTT prior to heparin initiation if not drawn  previously. Do not wait for aPTT result prior to heparin  initiation. When therapeutic range is reached per protocol,  change aPTT frequency to daily at [facility] daily at 0400 until  heparin is discontinued, except for ICU patients - continue  q8h aPTTs    Basic Metabolic Panel [[id number]]  (Abnormal) Collected:  09/07/18 0428    Specimen:  Blood Updated:  09/07/18 0534     Glucose 191 mg/dL      BUN 43.3 mg/dL      Creatinine 1.2 mg/dL      Calcium 8.2 mg/dL      Sodium 295 mEq/L      Potassium 4.0 mEq/L      Chloride 105 mEq/L      CO2 22 mEq/L      Anion Gap  7.0    Magnesium [[id number]] Collected:  09/07/18 0428    Specimen:  Blood Updated:  09/07/18 0534     Magnesium 1.8 mg/dL     GFR [[id number]] Collected:  09/07/18 0428     Updated:  09/07/18 0534     EGFR 43.9    CBC without differential [  [id number]]  (Abnormal) Collected:  09/07/18 0428    Specimen:  Blood Updated:  09/07/18 0522     WBC 7.48 x10 3/uL      Hgb 7.7 g/dL      Hematocrit 29.5 %      Platelets 273 x10 3/uL      RBC 2.48 x10 6/uL      MCV 91.5 fL      MCH 31.0 pg      MCHC 33.9 g/dL      RDW 13 %      MPV 10.8 fL      Nucleated RBC 0.0 /100 WBC      Absolute NRBC 0.00 x10 3/uL     Glucose Whole Blood - POCT [[id number]]  (Abnormal) Collected:  09/06/18 2051     Updated:  09/06/18 2054     Whole Blood Glucose POCT 212 mg/dL     Glucose Whole Blood - POCT [[id number]]  (Abnormal) Collected:  09/06/18 1526     Updated:  09/06/18 1601     Whole Blood Glucose POCT 208 mg/dL     Magnesium [[id number]] Collected:  09/06/18 0407    Specimen:  Blood Updated:  09/06/18 1557     Magnesium 1.7 mg/dL     Type and Screen [[id number]] Collected:  09/06/18 1446    Specimen:  Blood Updated:  09/06/18 1549     ABO Rh B POS     AB Screen Gel NEG    CBC without differential [[id number]]  (Abnormal) Collected:  09/06/18 1446    Specimen:  Blood Updated:  09/06/18 1509     WBC 7.69 x10 3/uL      Hgb 8.2 g/dL      Hematocrit 56.3 %      Platelets 258 x10 3/uL      RBC 2.64 x10 6/uL      MCV 90.5 fL      MCH 31.1 pg      MCHC 34.3 g/dL      RDW 13 %      MPV 10.3 fL      Nucleated RBC 0.0 /100 WBC      Absolute NRBC 0.00 x10 3/uL     Glucose Whole Blood - POCT [[id number]]  (Abnormal) Collected:  09/06/18 1152     Updated:  09/06/18 1159     Whole Blood Glucose POCT 262 mg/dL     Glucose Whole Blood - POCT [[id number]]  (Abnormal) Collected:  09/06/18 0721     Updated:  09/06/18 0726     Whole Blood Glucose POCT 198 mg/dL     Basic Metabolic Panel [[id number]]  (Abnormal) Collected:  09/06/18 0407    Specimen:  Blood Updated:   09/06/18 0538     Glucose 197 mg/dL      BUN 32.3 mg/dL      Creatinine 1.2 mg/dL      Calcium 7.8 mg/dL      Sodium 557 mEq/L      Potassium 3.7 mEq/L      Chloride 104 mEq/L      CO2 23 mEq/L      Anion Gap 8.0    GFR [[id number]] Collected:  09/06/18 0407     Updated:  09/06/18 0538     EGFR 43.9    CBC without differential [[id number]]  (Abnormal) Collected:  09/06/18 0407    Specimen:  Blood Updated:  09/06/18 0444     WBC 7.45 x10 3/uL  Hgb 7.9 g/dL      Hematocrit 16.1 %      Platelets 255 x10 3/uL      RBC 2.65 x10 6/uL      MCV 90.6 fL      MCH 29.8 pg      MCHC 32.9 g/dL      RDW 13 %      MPV 10.4 fL      Nucleated RBC 0.0 /100 WBC      Absolute NRBC 0.00 x10 3/uL     APTT [[id number]]  (Abnormal) Collected:  09/06/18 0407     Updated:  09/06/18 0444     PTT 94 sec     Narrative:       Obtain baseline aPTT prior to heparin initiation if not drawn  previously. Do not wait for aPTT result prior to heparin  initiation. When therapeutic range is reached per protocol,  change aPTT frequency to daily at [facility] daily at 0400 until  heparin is discontinued, except for ICU patients - continue  q8h aPTTs    Glucose Whole Blood - POCT [[id number]]  (Abnormal) Collected:  09/05/18 2026     Updated:  09/05/18 2040     Whole Blood Glucose POCT 215 mg/dL     Glucose Whole Blood - POCT [[id number]]  (Abnormal) Collected:  09/05/18 1536     Updated:  09/05/18 1541     Whole Blood Glucose POCT 218 mg/dL     Glucose Whole Blood - POCT [[id number]]  (Abnormal) Collected:  09/05/18 1231     Updated:  09/05/18 1234     Whole Blood Glucose POCT 203 mg/dL     Glucose Whole Blood - POCT [[id number]]  (Abnormal) Collected:  09/05/18 0742     Updated:  09/05/18 0749     Whole Blood Glucose POCT 226 mg/dL     Basic Metabolic Panel [[id number]]  (Abnormal) Collected:  09/05/18 0356    Specimen:  Blood Updated:  09/05/18 0530     Glucose 231 mg/dL      BUN 64.4 mg/dL      Creatinine 1.3 mg/dL      Calcium 8.0 mg/dL      Sodium 034 mEq/L       Potassium 3.7 mEq/L      Chloride 103 mEq/L      CO2 22 mEq/L      Anion Gap 8.0    GFR [[id number]] Collected:  09/05/18 0356     Updated:  09/05/18 0530     EGFR 40.0    APTT [[id number]]  (Abnormal) Collected:  09/05/18 0356     Updated:  09/05/18 0517     PTT 92 sec     Narrative:       Obtain baseline aPTT prior to heparin initiation if not drawn  previously. Do not wait for aPTT result prior to heparin  initiation. When therapeutic range is reached per protocol,  change aPTT frequency to daily at [facility] daily at 0400 until  heparin is discontinued, except for ICU patients - continue  q8h aPTTs    CBC without differential [[id number]]  (Abnormal) Collected:  09/05/18 0356    Specimen:  Blood Updated:  09/05/18 0503     Hgb 8.3 g/dL      Hematocrit 30.1 %      WBC 7.71 x10 3/uL      Platelets 250 x10 3/uL      RBC 2.71 x10 6/uL      MCV 90.4  fL      MCH 30.6 pg      MCHC 33.9 g/dL      RDW 13 %      MPV 10.8 fL      Nucleated RBC 0.0 /100 WBC      Absolute NRBC 0.00 x10 3/uL     Prepare/Crossmatch Red Blood Cells:  One Unit, 1 Units [[id number]] Collected:  09/01/18 0621     Updated:  09/05/18 0044     RBC Leukoreduced RBC Leukoreduced     BLUNIT [id number]     Status transfused     PRODUCT CODE (NON READABLE) E0336V00     Expiration Date [id number]     UTYPE B POS     ISBT CODE 7300    Glucose Whole Blood - POCT [[id number]]  (Abnormal) Collected:  09/04/18 2141     Updated:  09/04/18 2156     Whole Blood Glucose POCT 234 mg/dL     APTT [[id number]]  (Abnormal) Collected:  09/04/18 2005     Updated:  09/04/18 2101     PTT 77 sec     Narrative:       Obtain baseline aPTT prior to heparin initiation if not drawn  previously. Do not wait for aPTT result prior to heparin  initiation. When therapeutic range is reached per protocol,  change aPTT frequency to daily at [facility] daily at 0400 until  heparin is discontinued, except for ICU patients - continue  q8h aPTTs    Hemoglobin and hematocrit, blood [[id number]]  (Abnormal)  Collected:  09/04/18 1609    Specimen:  Blood Updated:  09/04/18 1642     Hgb 8.3 g/dL      Hematocrit 34.7 %     Glucose Whole Blood - POCT [[id number]]  (Abnormal) Collected:  09/04/18 1612     Updated:  09/04/18 1632     Whole Blood Glucose POCT 208 mg/dL     APTT [[id number]]  (Abnormal) Collected:  09/04/18 1154     Updated:  09/04/18 1245     PTT 91 sec     Narrative:       Obtain baseline aPTT prior to heparin initiation if not drawn  previously. Do not wait for aPTT result prior to heparin  initiation. When therapeutic range is reached per protocol,  change aPTT frequency to daily at [facility] daily at 0400 until  heparin is discontinued, except for ICU patients - continue  q8h aPTTs            Radiology Results:    Ct Head Wo Contrast    Result Date: 08/17/2018  No acute intracranial abnormality. Eloise Harman, MD  08/17/2018 12:36 AM    X-ray Chest Ap Portable    Result Date: 08/26/2018    No acute findings post ICD placement. Geanie Cooley, MD  08/26/2018 4:25 PM    Xr Chest Ap Portable    Result Date: 08/19/2018  Lines and tubes as described without pneumothorax. Bibasilar atelectasis without focal consolidation identified. Colonel Bald, MD  08/19/2018 12:35 PM    Xr Chest Ap Portable    Result Date: 08/17/2018   Right IJ central venous catheter has been pulled back with tip now at the cavoatrial junction. Shelly Flatten, MD  08/17/2018 6:29 PM    Xr Chest Ap Portable    Result Date: 08/17/2018   1. Right IJ central venous catheter placement with tip in the right atrium. 2. Retrocardiac opacity may represent atelectasis or effusion. Shelly Flatten, MD  08/17/2018 5:28 PM    Chest Ap Portable    Result Date: 08/17/2018   1. ET tube tip 3.2 cm above the carina and gastric drainage tube sidehole in the stomach and tip below the field-of-view. 2. Central vascular congestion, likely upper lobe edema, and bibasilar atelectasis. Infection/aspiration cannot be entirely excluded. 3. Borderline enlarged cardiopericardial silhouette.  Eloise Harman, MD  08/17/2018 12:11 AM    US Venous Duplex Doppler Arm Left    Result Date: 09/01/2018  Extensive occlusive and nonocclusive left upper extremity DVT in the region of left pacer wire, as detailed. Per discussion with clinical team at the time of dictation, there are aware of this finding. Janina Mayo, MD  09/01/2018 10:46 AM    US Venous Duplex Doppler Arm Right    Result Date: 09/07/2018   Extensive right basilic occlusive thrombus in associated thrombophlebitis. Continuous flow right cephalic vein. Other findings as above. Findings were discussed with patient's nurse Ronaldo Miyamoto at 11:50 AM on the day of the exam. Prince Solian, MD  09/07/2018 11:50 AM              DISCHARGE MEDICATIONS:       Discharge Medication List      Taking    acetaminophen 325 MG tablet  Dose:  650 mg  Commonly known as:  TYLENOL  Take 2 tablets (650 mg total) by mouth every 6 (six) hours as needed for Pain     atorvastatin 40 MG tablet  Dose:  40 mg  What changed:     medication strength   how much to take  Commonly known as:  LIPITOR  Start taking on:  September 09, 2018  Take 1 tablet (40 mg total) by mouth daily     carvedilol 3.125 MG tablet  Dose:  3.125 mg  What changed:     medication strength   how much to take   when to take this   additional instructions  Commonly known as:  COREG  Take 1 tablet (3.125 mg total) by mouth every 12 (twelve) hours     CENTRUM PO  Take by mouth.     enoxaparin 60 MG/0.6ML Soln  Dose:  1 mg/kg  Commonly known as:  LOVENOX  Inject 0.6 mLs (60 mg total) into the skin every 12 (twelve) hours  Notes to patient:  Next dose at 8pm      metFORMIN 500 MG tablet  Dose:  500 mg  Commonly known as:  GLUCOPHAGE  For:  Type 2 Diabetes  Take 1 tablet (500 mg total) by mouth 2 (two) times daily Morning and night     pantoprazole 40 MG tablet  Dose:  40 mg  Commonly known as:  PROTONIX  Take 1 tablet (40 mg total) by mouth 2 (two) times daily     sacubitril-valsartan 24-26 MG Tabs per tablet  Dose:  1 tablet   Commonly known as:  ENTRESTO  Take 1 tablet by mouth every 12 (twelve) hours  Replaces:  Entresto 49-51 MG Tabs per tablet     SITagliptin 100 MG tablet  Dose:  100 mg  Commonly known as:  JANUVIA  Take 100 mg by mouth daily. At night     spironolactone 25 MG tablet  Dose:  25 mg  Commonly known as:  ALDACTONE  Take 1 tablet (25 mg total) by mouth daily     vitamin B-12 100 MCG tablet  Dose:  1,000 mcg  Commonly known as:  CYANOCOBALAMIN  Take 1,000 mcg by mouth daily         STOP taking these medications    Entresto 49-51 MG Tabs per tablet  Generic drug:  sacubitril-valsartan  Replaced by:  sacubitril-valsartan 24-26 MG Tabs per tablet              Allergies:    Allergies   Allergen Reactions    Shellfish-Derived Products Anaphylaxis             DISCHARGE INSTRUCTIONS        Disposition:  Home Health Services    Diet:cardiac    Activity/Weight Bearing Status:        Complete instructions and follow up are in the patient's After Visit Summary    Follow-up Plan:    Follow-up Information     Elease Hashimoto M, DO. Go on 10/10/2018.    Specialty:  Diagnostic Radiology  Why:  9:15 am ultrasound followed by follow up with Dr. Elease Hashimoto to evaluate for left arm DVT  Contact information:  [address]  [place] [address]  [phone]             Tammi Sou, MD. Schedule an appointment as soon as possible for a visit in 3 day(s).    Specialty:  Family Medicine  Why:  Make appointment to follow up and have your blood work checked in 2-3 days to monitor your anemia. Also follow up for post hospital visit and medication review  Contact information:  [address]  [place] [address]  [phone]             Donnie Mesa, MD. Schedule an appointment as soon as possible for a visit in 1 week(s).    Specialty:  Cardiology  Why:  For post hospital follow up and heart failure medication review. Your medications were reduced during this stay.   Contact information:  [address]  [address]  [place] [address]  [phone]             Russ Halo, MD. Schedule an appointment as soon as possible for a visit in 3 day(s).    Specialties:  Hematology, Medical Oncology, Internal Medicine  Why:  Call office to schedule a follow up appointment and for transition to [facility]  Contact information:  [address]  [place] [address]  [phone]             [facility]. Call in 2 days.    Why:  Resumption of Care - Home Health   Contact information:  [address]  [place] [address]  [phone]                         Immunizations Provided:    There is no immunization history on file for this patient.      Minutes spent coordinating discharge and reviewing discharge plan:40 minutes    Signed by: Tyrone Schimke, MD    Communication with PCP:   Attempted to contact PCP to update but unable to reach    CC: Tammi Sou, MD

## 2018-09-09 NOTE — Progress Notes (Signed)
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 She received 3 shocks, 1 round of epi with successful ROSC. 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/2018    per patient, he only sees peripheral view    Diabetes mellitus 1992    Gout spondylitis     Gout synovitis     LBBB (left bundle branch block)     LBBB chronic    NICM (nonischemic cardiomyopathy) 08/2018    NICM, EF 20%    Respiratory failure 08/2018    Hypoxic/anoxic respiratory failure, extubated 08/20/2018    Type 2 diabetes mellitus, controlled     Viral cardiomyopathy 2008     Patient Active Problem List   Diagnosis    Peritonitis    Type II diabetes mellitus without mention of complication    Gout synovitis    Type 2 diabetes mellitus without complications    Viral cardiomyopathy    CHF (congestive heart failure)    Cardiac arrest    Atrophic vaginitis    Chronic vulvitis    Arm DVT (deep venous thromboembolism), acute, left       JERSEY ESPINOZA, was recently hospitalized at [facility] from 6/19-6/28/2020 for primary diagnosis of DVT in left arm.    All eligible TCM Diagnoses (include A1C, EF, weight):       CHF EF 21%  5' 5''  131 lbs  LACE 14    How are you feeling? [name] reported that pt's swelling in arm has improved and that pt continues to take Lovenox as instructed.     Are you feeling better or  worse since your hospital discharge? [name] reported that pt is feeling better since leaving hospital.       If so, what symptoms are you experiencing? [name] denied that pt is experiencing any CHF signs and symptoms at this time. Please see call summary for additional details.       Does the patient understand why he/she was admitted to the hospital:  No: pt's daughter, Gaylan Gerold understands     Does the patient understand what the discharge diagnosis was: : Pt's daughter, Gaylan Gerold understands     What was the patients chief complaint upon hospital arrival: Left arm swelling      Medication Reconciliation--    Medication List:  Current Outpatient Medications   Medication Sig Dispense Refill    acetaminophen (TYLENOL) 325 MG tablet Take 2 tablets (650 mg total) by mouth every 6 (six) hours as needed for Pain  0    atorvastatin (LIPITOR) 40 MG tablet Take 1 tablet (40 mg total) by mouth daily 30 tablet 1    carvedilol (COREG) 3.125 MG tablet Take 1 tablet (3.125 mg total) by mouth every 12 (twelve)  hours 60 tablet 1    enoxaparin (LOVENOX) 60 MG/0.6ML Solution Inject 0.6 mLs (60 mg total) into the skin every 12 (twelve) hours 36 mL 3    metFORMIN (GLUCOPHAGE) 500 MG tablet Take 1 tablet (500 mg total) by mouth 2 (two) times daily Morning and night      Multiple Vitamins-Minerals (CENTRUM PO) Take by mouth.        pantoprazole (PROTONIX) 40 MG tablet Take 1 tablet (40 mg total) by mouth 2 (two) times daily 60 tablet 2    sacubitril-valsartan (ENTRESTO) 24-26 MG Tab per tablet Take 1 tablet by mouth every 12 (twelve) hours 60 tablet 1    sitaGLIPtin (JANUVIA) 100 MG tablet Take 100 mg by mouth daily. At night       spironolactone (ALDACTONE) 25 MG tablet Take 1 tablet (25 mg total) by mouth daily 30 tablet 0    vitamin B-12 (CYANOCOBALAMIN) 100 MCG tablet Take 1,000 mcg by mouth daily          No current facility-administered medications for this visit.        Were the medications reviewed with the patient:  Yes,  reviewed with [name]    Was the patient able to obtain all of his/her medications when they left the hospital:  Yes     Does the patient understand what all of his/her medications are for: Yes    Patient aware of side effects from provided teaching in hospital or pamphlets: Yes     Was teaching done on medications: Yes     Plan for resolution of any medication concerns: None at this time     Physician Follow Up--    Was the patient scheduled/seen by PCP or cardiologist within critical timeframe of hospital discharge: No. CM offered assistance with scheduling PCP and cardiology appts, but Gaylan Gerold stated that she is able to schedule. CM discussed the importance of 3-5 day follow up and [name] expressed understanding. [name] declined assistance with scheduling [facility] appt within 5 days post discharge.    Does patient understand importance of seeing a healthcare professional within critical timeframe of discharge: Yes    If appointment not scheduled within critical timeframe of discharge, what assistance offered to secure appointment: [name] declined assistance with coordinating appts at this time.     Name/number of PCP: Tammi Sou, MD [phone]    Name/number of cardiologist: Timmothy Euler MD [phone]    Letter sent to PCP notifying of TCM involvement: Yes    Attempted outreach to PCP/Specialist to discuss patient care plan and updates: No       If yes, information discussed: CM did not perform PCP outreach as it is not clinically necessary at this time. Pt is not exhibiting any CHF signs or symptoms at this time.     If uninsured and without a medical home, plan for placement to a permanent medical home:  Pt has Medicare A & B and [facility]    Home Health Co-Management--    Is patient being followed by Home Health: Yes     If yes:    Name/number of Home Health agency: [facility] [phone]    Letter sent to Home Health agency notifying of TCM involvement: Yes    Summary of  coordination/communication with Home Health agency staff: CM reach out to [facility] for start of care date. CM was informed pt is likely scheduled for 6/30.       CHF Management--    What  is patients understanding of CHF: Gaylan Gerold was very familiar with pt's CHF diagnosis. CM discussed the importance of pt following sodium restriction and fluid restriction (48 ounces).     Does the patient have a scale: Yes    If no scale, what is the plan: N/A    Does the patient weigh himself/herself daily: Yes    Does the patient understand the importance of when (in AM) s/he should be weighed and why (consistency and reporting): Yes    Has the patients MD told him/her what his/her dry weight should be: Yes    What was weight since hospital discharge: 131 lbs    Is the patient keeping a weight log: Yes    Does the patient understand why dry weight is important: Yes    Was the patient educated on the CHF red flags/symptoms: Yes   Weight gain of 2 pounds in a day or 5 pounds in a week  Increased swelling of your hands, abdomen, legs, or ankles   Increase in shortness of breath with activity  Increase in the number of pillows needed to sleep at night  New or more frequent chest pain or tightness  New onset of dizziness or new onset of dizziness or lightheadedness after standing up    Is the patient currently experiencing any of the CHF red flags/symptoms: No       Was the patient instructed on what to do if he/she is experiencing any of the CHF red flags/symptoms: Yes     Is patient familiar with the CHF zones: Yes    Education on zones provided: Yes    Was [facility] TCM visit initiated: No, pt is active with home health      Does patient meet EF requirements for cardiac rehab?  Yes    If yes, patient notified of cardiac rehab services and that a cardiac rehab team member will be calling patient with more information:  Yes     Date patient was referred to cardiac rehab: 09/09/2018    Educational materials mailed to  patient:  No      Call Summary Notes--    CM placed call to pt's daughter, Gaylan Gerold at [phone] for follow up. [name] stated that she and family are currently caring for pt and that pt is not alone. [name] confirmed that pt has all medications. CM agreed to reach out to [facility] for start of care date. CM offered assistance with scheduling PCP and cardiology appts, but Gaylan Gerold stated that she is able to schedule. CM discussed the importance of 3-5 day follow up and [name] expressed understanding. [name] declined assistance with scheduling [facility] appt within 5 days post discharge. [name] understands the importance of pt following up with PCP for blood work.     CM inquired about swelling in pt's arm. [name] reported that the appearance has improved and that the swelling has decreased. [name] stated that pt has another ultrasound scheduled for 7/30.  [name] confirmed that pt is taking Lovenox injections every 12 hours. CM also discussed pt's CHF diagnosis which Gaylan Gerold was aware. [name] reported pt's weight at 131 lbs. [name] reported pt's systolic blood pressure at 119. CM reviewed CHF signs and symptoms including increased SOB, swelling, weight gain, chest pain, dizziness and lightheadedness, all of which [name] denied experiencing. 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 Gaylan Gerold has my information and knows she can call.     CM will follow up.    Arlee Muslim,  MSW  Case Manager  [facility]  [phone]

## 2018-09-11 ENCOUNTER — Ambulatory Visit (INDEPENDENT_AMBULATORY_CARE_PROVIDER_SITE_OTHER): Payer: Self-pay | Admitting: Nurse Practitioner

## 2018-09-11 ENCOUNTER — Telehealth: Payer: Self-pay

## 2018-09-11 NOTE — Telephone Encounter (Signed)
Transitional Care Management    CM attempted to reach patient's daughter, Gaylan Gerold at [phone] for general follow up on pt, however no one answered; therefore CM left voice message asking to return call to [phone].    CM will follow up.    Arlee Muslim, MSW  Case Manager  [facility]/[place] [facility]  [phone]

## 2018-09-12 ENCOUNTER — Encounter (FREE_STANDING_LABORATORY_FACILITY): Payer: Medicare Other

## 2018-09-12 DIAGNOSIS — I429 Cardiomyopathy, unspecified: Secondary | ICD-10-CM

## 2018-09-12 DIAGNOSIS — D500 Iron deficiency anemia secondary to blood loss (chronic): Secondary | ICD-10-CM

## 2018-09-12 LAB — CBC AND DIFFERENTIAL
Absolute NRBC: 0 10*3/uL (ref 0.00–0.00)
Basophils Absolute Automated: 0.04 10*3/uL (ref 0.00–0.08)
Basophils Automated: 0.5 %
Eosinophils Absolute Automated: 0.31 10*3/uL (ref 0.00–0.44)
Eosinophils Automated: 3.6 %
Hematocrit: 30.9 % — ABNORMAL LOW (ref 34.7–43.7)
Hgb: 9.7 g/dL — ABNORMAL LOW (ref 11.4–14.8)
Immature Granulocytes Absolute: 0.08 10*3/uL — ABNORMAL HIGH (ref 0.00–0.07)
Immature Granulocytes: 0.9 %
Lymphocytes Absolute Automated: 2.12 10*3/uL (ref 0.42–3.22)
Lymphocytes Automated: 24.6 %
MCH: 29.8 pg (ref 25.1–33.5)
MCHC: 31.4 g/dL — ABNORMAL LOW (ref 31.5–35.8)
MCV: 95.1 fL (ref 78.0–96.0)
MPV: 10.9 fL (ref 8.9–12.5)
Monocytes Absolute Automated: 1.17 10*3/uL — ABNORMAL HIGH (ref 0.21–0.85)
Monocytes: 13.6 %
Neutrophils Absolute: 4.89 10*3/uL (ref 1.10–6.33)
Neutrophils: 56.8 %
Nucleated RBC: 0 /100 WBC (ref 0.0–0.0)
Platelets: 260 10*3/uL (ref 142–346)
RBC: 3.25 10*6/uL — ABNORMAL LOW (ref 3.90–5.10)
RDW: 14 % (ref 11–15)
WBC: 8.61 10*3/uL (ref 3.10–9.50)

## 2018-09-12 LAB — COMPREHENSIVE METABOLIC PANEL
ALT: 39 U/L (ref 0–55)
AST (SGOT): 37 U/L — ABNORMAL HIGH (ref 5–34)
Albumin/Globulin Ratio: 1.2 (ref 0.9–2.2)
Albumin: 3.3 g/dL — ABNORMAL LOW (ref 3.5–5.0)
Alkaline Phosphatase: 129 U/L — ABNORMAL HIGH (ref 37–106)
Anion Gap: 15 (ref 5.0–15.0)
BUN: 16 mg/dL (ref 7.0–19.0)
Bilirubin, Total: 0.3 mg/dL (ref 0.2–1.2)
CO2: 18 mEq/L — ABNORMAL LOW (ref 21–29)
Calcium: 8.5 mg/dL (ref 7.9–10.2)
Chloride: 105 mEq/L (ref 100–111)
Creatinine: 1.3 mg/dL (ref 0.4–1.5)
Globulin: 2.8 g/dL (ref 2.0–3.7)
Glucose: 182 mg/dL — ABNORMAL HIGH (ref 70–100)
Potassium: 4.2 mEq/L (ref 3.5–5.1)
Protein, Total: 6.1 g/dL (ref 6.0–8.3)
Sodium: 138 mEq/L (ref 136–145)

## 2018-09-12 LAB — GFR: EGFR: 40

## 2018-09-12 LAB — HEMOLYSIS INDEX: Hemolysis Index: 2 (ref 0–18)

## 2018-09-12 NOTE — Progress Notes (Signed)
Transitional Care Management - Check in Call    CM received incoming voice message from pt's sister, Tara Perry requesting a returned call.     How are you feeling? CM placed call to [phone] and spoke with pt's sister, Tara Perry. Tara Perry identified as the best person to speak with as Tara Perry has returned to work. Tara Perry stated that she is currently staying with pt at this time. Tara Perry reported that the pt is doing well. Tara Perry reported that pt had telemedicine visit with PCP on this morning, 7/2 and orders were faxed to [facility] for lab draw. No medication changes.  Tara Perry also confirmed that pt had telemedicine visit with [facility] on 7/1 and metformin was increased to 1,000 mg twice per day.     Are you feeling better or worse since the last time we talked? Tara Perry reported that pt is feeling better.      If so, what symptoms are you experiencing? Tara Perry reported that pt's breathing is fine. Tara Perry stated that pt is not experiencing any chest pain, dizziness, lightheadedness, swelling, and weight gain. Tara Perry reported pt's weight at 129.6 lbs and blood pressure 123/66 and pulse 69.     Are you having any medical concerns or questions at this time? Tara Perry denied that she has any additional questions. Tara Perry has my information and knows she can call.     CM will follow up.    Arlee Muslim, MSW  Case Manager  [facility]/ [facility] Management  [phone]

## 2018-09-19 NOTE — Progress Notes (Signed)
Transitional Care Management     How are you feeling? Tara Perry reported that the pt is feeling "fine" on today.     Are you feeling better or worse since your hospital discharge? Tara Perry reported that the pt is improving since leaving hospital.       If so, what symptoms are you experiencing? Tara Perry denied that pt is experiencing any CHF signs and symptoms at this time. Please see call summary for additional details.       CHF Management-Teachback--  Does the patient understand what could make their heart failure worse (diet, liquids, activity, weight, smoking, infections): Yes  Can the patient identify the signs and symptoms that CHF is worsening: Yes     Can the patient identify what to do if he/she experiences these symptoms: Yes      Physician Follow Up/Medication Follow Up--    Patient seen by PCP or cardiologist within critical timeframe of hospital discharge: Yes, pt had telemedicine visit with [facility] on 7/1 and PCP 7/2.      If no, plan for patient to be seen by PCP or cardiologist: N/A    Were any medications changed at follow up medical appointments:  Yes, metformin was increased to 1,000 mg twice per day    Attempted outreach to PCP/Specialist to discuss patient care plan and updates: No       If yes, information discussed: CM did not perform PCP outreach as it is not clinically necessary at this time. Tara Perry denied that the pt is exhibiting any CHF signs or symptoms at this time.     If uninsured and without a medical home, follow up on plan for placement to a permanent medical home:   N/A    Depression Screen--    Over the past 4 weeks, has the patient felt down, depressed or hopeless:  No      Over the past 4 weeks, has the patients felt little interest or pleasure in doing things:  No      What resource(s) provided if positive response to one or both of the depression screen questions: Tara Perry denied that pt is feeling down, sad or depressed.       Gaps in Care/Resource Connection--    Living  situation (alone, family, home environment, etc): Tara Perry currently staying with pt. Tara Perry stated that pt also has children that live nearby.     Gap in Care (insurance status, medication coverage, community connections, transportation, etc): Tara Perry denied that pt needs additional resources at this time.     Referrals placed for patient: None at this time.     Call Summary Notes--    CM placed call to [phone] and spoke with pt's sister, Tara Perry for follow up on pt. Tara Perry reported that pt is fine at this time. Tara Perry reported pt's weight at 127.8 lbs. Tara Perry noted that pt has been losing some weight due to walking more and eating less. CM offered oral nutritional supplement, but Tara Perry stated that pt would not be interested. CM also asked about other CHF signs and symptoms such as breathing and swelling. Tara Perry stated that the pt worked with home health nurse on yesterday and pt is fine. Tara Perry stated that her main concern is that the pt has different people coming in for physical therapy and wishes to have the same person. CM offered to call [facility], and Tara Perry stated that she would prefer to call. CM provided phone number. Tara Perry has no additional questions at this time.  Tara Perry has my information and knows she can call.     CM will follow up.    Arlee Muslim, MSW  Case Manager  [facility]/[place] [facility]  [phone]

## 2018-09-24 ENCOUNTER — Encounter: Payer: Self-pay | Admitting: Hematology

## 2018-09-24 ENCOUNTER — Ambulatory Visit: Payer: Medicare Other | Attending: Hematology | Admitting: Hematology

## 2018-09-24 ENCOUNTER — Telehealth: Payer: Self-pay | Admitting: Hematology

## 2018-09-24 VITALS — BP 121/78 | HR 89 | Temp 97.0°F | Resp 18 | Wt 127.8 lb

## 2018-09-24 DIAGNOSIS — I82622 Acute embolism and thrombosis of deep veins of left upper extremity: Secondary | ICD-10-CM

## 2018-09-24 DIAGNOSIS — D508 Other iron deficiency anemias: Secondary | ICD-10-CM

## 2018-09-24 NOTE — Telephone Encounter (Signed)
Spoke with [facility] home [facility]  Confirmed labs to be drawn during next home visit on 7/15 or 7/16  Lab orders faxed to [phone]  Please schedule patient for telehealth follow up in 2.5 mos

## 2018-09-24 NOTE — Telephone Encounter (Signed)
1. I have ordered labs, patient wants them to be drawn on Thursday by her home health nurse. Its home health through [facility]- do you need to do anything for labs to get drawn? [phone] was hte number provided to me by patient    2. RTC 2.5 mo telehealth

## 2018-09-24 NOTE — Progress Notes (Signed)
VIDEO VISIT - [facility]      CONSENT: Verbal consent has been obtained from the patient to conduct a video visit encounter to minimize exposure to COVID-19: yes    Total time spent face to face with patient: 20 minutes.  Time spent on counseling/coordination of care: > 50%.  Regarding: hospital follow up , LUE provoked DVT     HPI/HEMATOLOGY HISTORY: Ms. Staiger was seen today via video visit in hematology follow up.  She is a 73 y.o. female with history of DM, NICM, CKD    Patient was admitted 6/5-6/16/20 for VF cardiac arrest- underwent hypothermic protocol. s/p BiV ICD placement 08/26/18. She presented to [facility] 08/30/18 with LUE swelling. She was found to have acute subclavian DVT. She was started on Lovenox therapeutic. Her course was complicated by hematemesis on 09/01/18. EGD showed large duodenal ulcer with visible vessels s/p cauterization, clipping. She resumed anticoagulation and was discharged on Lovenox 1mg /kg BID after stability of hemoglobin on a/c.     INTERVAL HISTORY:     Lovenox injections twice a daily ; minor bruises, no knots under skin. Son gives one, daughter gives other. She reports no swelling and no pain in arm.    She notes much less chest pain, no heartburn etc. She has been taking the protonix BID. No melena, hematochezia; brownish diarrhea one day. Seeing GI: 09/25/18     She has been eating iron rich foods including steak.     MEDICATIONS/ALLERGIES:     Outpatient Medications Marked as Taking for the 09/24/18 encounter (Telemedicine Visit) with Russ Halo, MD   Medication Sig Dispense Refill    acetaminophen (TYLENOL) 325 MG tablet Take 2 tablets (650 mg total) by mouth every 6 (six) hours as needed for Pain  0    atorvastatin (LIPITOR) 40 MG tablet Take 1 tablet (40 mg total) by mouth daily 30 tablet 1    carvedilol (COREG) 3.125 MG tablet Take 1 tablet (3.125 mg total) by mouth every 12 (twelve) hours 60 tablet 1    enoxaparin (LOVENOX) 60 MG/0.6ML Solution  Inject 0.6 mLs (60 mg total) into the skin every 12 (twelve) hours 36 mL 3    metFORMIN (GLUCOPHAGE) 500 MG tablet Take 1 tablet (500 mg total) by mouth 2 (two) times daily Morning and night      Multiple Vitamins-Minerals (CENTRUM PO) Take by mouth.        pantoprazole (PROTONIX) 40 MG tablet Take 1 tablet (40 mg total) by mouth 2 (two) times daily 60 tablet 2    sacubitril-valsartan (ENTRESTO) 24-26 MG Tab per tablet Take 1 tablet by mouth every 12 (twelve) hours 60 tablet 1    sitaGLIPtin (JANUVIA) 100 MG tablet Take 100 mg by mouth daily. At night       spironolactone (ALDACTONE) 25 MG tablet Take 1 tablet (25 mg total) by mouth daily 30 tablet 0    vitamin B-12 (CYANOCOBALAMIN) 100 MCG tablet Take 1,000 mcg by mouth daily            Allergies   Allergen Reactions    Shellfish-Derived Products Anaphylaxis       PAST HISTORY:    Past Medical History:   Diagnosis Date    Atrophic vaginitis 08/30/2018    Cardiac arrest 08/17/2018    VF arrest. She received 3 shocks, 1 round of epi with successful ROSC. 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    CKD (chronic kidney disease), stage III  Congestive heart failure     Detached retina, right 08/24/2018    per patient, he only sees peripheral view    Diabetes mellitus 1992    Gout spondylitis     Gout synovitis     LBBB (left bundle branch block)     LBBB chronic    NICM (nonischemic cardiomyopathy) 08/2018    NICM, EF 20%    Respiratory failure 08/2018    Hypoxic/anoxic respiratory failure, extubated 08/20/2018    Type 2 diabetes mellitus, controlled     Viral cardiomyopathy 2008        Past Surgical History:   Procedure Laterality Date    COLONOSCOPY  2002    normal    EGD N/A 09/01/2018    Procedure: EGD;  Surgeon: Lilia Pro, MD;  Location: [facility];  Service: Gastroenterology;  Laterality: N/A;    EXPLORATORY LAPAROTOMY      ICD IMPLANT BIV N/A 08/26/2018    Procedure: ICD IMPLANT BIV;  Surgeon: Len Childs, MD;   Location: [facility];  Service: Cardiovascular;  Laterality: N/A;  medtronic    LHC W/ CORONARY ANGIOS AND LV Left 08/21/2018    Procedure: LHC W/ CORONARY ANGIOS AND LV;  Surgeon: Burna Forts, MD;  Location: [facility];  Service: Cardiovascular;  Laterality: Left;       Family History   Problem Relation Age of Onset    Cancer Father 19        bladder       Social History     Socioeconomic History    Marital status: Widowed     Spouse name: Not on file    Number of children: Not on file    Years of education: Not on file    Highest education level: Not on file   Occupational History    Not on file   Social Needs    Financial resource strain: Not on file    Food insecurity     Worry: Not on file     Inability: Not on file    Transportation needs     Medical: Not on file     Non-medical: Not on file   Tobacco Use    Smoking status: Never Smoker    Smokeless tobacco: Never Used   Substance and Sexual Activity    Alcohol use: Yes     Comment: social    Drug use: No    Sexual activity: Not on file   Lifestyle    Physical activity     Days per week: Not on file     Minutes per session: Not on file    Stress: Not on file   Relationships    Social connections     Talks on phone: Not on file     Gets together: Not on file     Attends religious service: Not on file     Active member of club or organization: Not on file     Attends meetings of clubs or organizations: Not on file     Relationship status: Not on file    Intimate partner violence     Fear of current or ex partner: Not on file     Emotionally abused: Not on file     Physically abused: Not on file     Forced sexual activity: Not on file   Other Topics Concern    Not on file   Social History Narrative    Not on  file       Patient Active Problem List   Diagnosis    Peritonitis    Type II diabetes mellitus without mention of complication    Gout synovitis    Type 2 diabetes mellitus without complications    Viral cardiomyopathy     CHF (congestive heart failure)    Cardiac arrest    Atrophic vaginitis    Chronic vulvitis    Arm DVT (deep venous thromboembolism), acute, left       REVIEW OF SYSTEMS: All other systems reviewed and negative except as above.   Gen/Constitutional: No fevers, no chills, no unintentional weight loss.  Eyes: No visual changes   ENT: no hearing loss, no nasal discharge, no epistaxis, no sore throat   Cdv: no chest pain, no palpitations  Resp: no cough, no shortness of breath, no wheezing  GI: no abdominal pain, no heartburn, no nausea/vomiting, no diarrhea, no constipation, no hematochezia, no melena   GU: no hematuria, no urinary frequency  Musculoskeletal: no joint pain, no joint swelling, no restricted motion, no peripheral edema, no varicosities   Skin: no rashes, no sores, no blisters  Neuro: no numbness/tingling, no sensation loss, no neuropathy  Psych: no nervousness, no anxiety, no depression  Endo: no heat/cold intolerance, no excessive thirst  Hem/Lymph: no bleeding (epistaxis, mucosal bleeding, hematemesis, hemoptysis, hematochezia, hematuria, melena), no easy bruising, no lymphadenopathy     PHYSICAL EXAMINATION  Vital Signs: BP 121/78    Pulse 89    Temp 97 F (36.1 C) (Temporal)    Resp 18    Wt 58 kg (127 lb 12.8 oz)    BMI 21.27 kg/m      The following limited physical exam components were observed via video:    General Appearance:  Alert, cooperative, no distress, appears stated age   Head: Normocephalic, without obvious abnormality, atraumatic   Eyes:  Sclera anicteric, conjunctiva without pallor   Lungs:    Abdomen:   Breathing comfortably without accessory muscle use. Normal respiratory effort.   Non-distended   Musculoskeletal: No apparent gait or station abnormalities    Skin: Normal skin color, texture, no visible rashes or lesions   Extremities: No apparent edema   Neurologic:  Mental Status: Alert and oriented x3, face symmetric, tongue midline.  Normal mood and affect, cooperative,  judgement intact     LABS/RADIOLOGY:     No visits with results within 1 Day(s) from this visit.   Latest known visit with results is:   Appointment on 09/12/2018   Component Date Value Ref Range Status    Glucose 09/12/2018 182* 70 - 100 mg/dL Final    BUN 54/11/8117 16.0  7.0 - 19.0 mg/dL Final    Creatinine 14/78/2956 1.3  0.4 - 1.5 mg/dL Final    Sodium 21/30/8657 138  136 - 145 mEq/L Final    Potassium 09/12/2018 4.2  3.5 - 5.1 mEq/L Final    Chloride 09/12/2018 105  100 - 111 mEq/L Final    CO2 09/12/2018 18* 21 - 29 mEq/L Final    Calcium 09/12/2018 8.5  7.9 - 10.2 mg/dL Final    Protein, Total 09/12/2018 6.1  6.0 - 8.3 g/dL Final    Albumin 84/69/6295 3.3* 3.5 - 5.0 g/dL Final    AST (SGOT) 28/41/3244 37* 5 - 34 U/L Final    ALT 09/12/2018 39  0 - 55 U/L Final    Alkaline Phosphatase 09/12/2018 129* 37 - 106 U/L Final    Bilirubin,  Total 09/12/2018 0.3  0.2 - 1.2 mg/dL Final    Globulin 82/95/6213 2.8  2.0 - 3.7 g/dL Final    Albumin/Globulin Ratio 09/12/2018 1.2  0.9 - 2.2 Final    Anion Gap 09/12/2018 15.0  5.0 - 15.0 Final    WBC 09/12/2018 8.61  3.10 - 9.50 x10 3/uL Final    Hgb 09/12/2018 9.7* 11.4 - 14.8 g/dL Final    Hematocrit 08/65/7846 30.9* 34.7 - 43.7 % Final    Platelets 09/12/2018 260  142 - 346 x10 3/uL Final    RBC 09/12/2018 3.25* 3.90 - 5.10 x10 6/uL Final    MCV 09/12/2018 95.1  78.0 - 96.0 fL Final    MCH 09/12/2018 29.8  25.1 - 33.5 pg Final    MCHC 09/12/2018 31.4* 31.5 - 35.8 g/dL Final    RDW 96/29/5284 14  11 - 15 % Final    MPV 09/12/2018 10.9  8.9 - 12.5 fL Final    Neutrophils 09/12/2018 56.8  None % Final    Lymphocytes Automated 09/12/2018 24.6  None % Final    Monocytes 09/12/2018 13.6  None % Final    Eosinophils Automated 09/12/2018 3.6  None % Final    Basophils Automated 09/12/2018 0.5  None % Final    Immature Granulocytes 09/12/2018 0.9  None % Final    Nucleated RBC 09/12/2018 0.0  0.0 - 0.0 /100 WBC Final    Neutrophils Absolute  09/12/2018 4.89  1.10 - 6.33 x10 3/uL Final    Lymphocytes Absolute Automated 09/12/2018 2.12  0.42 - 3.22 x10 3/uL Final    Monocytes Absolute Automated 09/12/2018 1.17* 0.21 - 0.85 x10 3/uL Final    Eosinophils Absolute Automated 09/12/2018 0.31  0.00 - 0.44 x10 3/uL Final    Basophils Absolute Automated 09/12/2018 0.04  0.00 - 0.08 x10 3/uL Final    Immature Granulocytes Absolute 09/12/2018 0.08* 0.00 - 0.07 x10 3/uL Final    Absolute NRBC 09/12/2018 0.00  0.00 - 0.00 x10 3/uL Final    Hemolysis Index 09/12/2018 2  0 - 18 Final    EGFR 09/12/2018 40.0   Final     RUE Doppler U/S 09/01/18: IMPRESSION:   Extensive occlusive and nonocclusive left upper extremity DVT in the  region of left pacer wire, as detailed. Per discussion with clinical  team at the time of dictation, there are aware of this finding.    RUE Doppler U/S 09/07/18: IMPRESSION:    Extensive right basilic occlusive thrombus in associated  thrombophlebitis. Continuous flow right cephalic vein. Other findings as  above.    IMPRESSION/RECOMMENDATIONS: Ms. Carranco is a 73 y.o. female with   1. Arm DVT (deep venous thromboembolism), acute, left    2. Other iron deficiency anemia      1. Provoked DVT: LUE provoked dvt from pacer wires and acute illness.   If hemoglobin stable, will call patient to determine if we switch over to DOAC- would plan for Eliquis 5mg  BID as long as GI okay with it as well as stable hemoglobin. Likely she will require 3 months anticoagulation, repeat ultrasound, and determination/discussion with cardiology regarding further need for a/c. Would expect the ICD leads to endothelialize however this may remain a provoking factor for her.     2. Iron defn anemia: In setting of GI bleed. Will check repeat CBC and iron studies. If iron deficient, can consider IV iron as PO will be tough for her tolerate currently.     Pt instructed to send  non urgent messages via MyChart and provided with [facility] clinic phone number. RTC 2.9months

## 2018-09-25 NOTE — Telephone Encounter (Signed)
LMTCB

## 2018-09-26 ENCOUNTER — Encounter (FREE_STANDING_LABORATORY_FACILITY): Payer: Medicare Other

## 2018-09-26 DIAGNOSIS — D508 Other iron deficiency anemias: Secondary | ICD-10-CM

## 2018-09-26 DIAGNOSIS — I82622 Acute embolism and thrombosis of deep veins of left upper extremity: Secondary | ICD-10-CM

## 2018-09-26 LAB — CBC AND DIFFERENTIAL
Absolute NRBC: 0 10*3/uL (ref 0.00–0.00)
Basophils Absolute Automated: 0.04 10*3/uL (ref 0.00–0.08)
Basophils Automated: 0.6 %
Eosinophils Absolute Automated: 0.22 10*3/uL (ref 0.00–0.44)
Eosinophils Automated: 3.4 %
Hematocrit: 32.5 % — ABNORMAL LOW (ref 34.7–43.7)
Hgb: 10.3 g/dL — ABNORMAL LOW (ref 11.4–14.8)
Immature Granulocytes Absolute: 0.02 10*3/uL (ref 0.00–0.07)
Immature Granulocytes: 0.3 %
Lymphocytes Absolute Automated: 1.76 10*3/uL (ref 0.42–3.22)
Lymphocytes Automated: 26.9 %
MCH: 30.2 pg (ref 25.1–33.5)
MCHC: 31.7 g/dL (ref 31.5–35.8)
MCV: 95.3 fL (ref 78.0–96.0)
MPV: 11.4 fL (ref 8.9–12.5)
Monocytes Absolute Automated: 0.81 10*3/uL (ref 0.21–0.85)
Monocytes: 12.4 %
Neutrophils Absolute: 3.7 10*3/uL (ref 1.10–6.33)
Neutrophils: 56.4 %
Nucleated RBC: 0 /100 WBC (ref 0.0–0.0)
Platelets: 282 10*3/uL (ref 142–346)
RBC: 3.41 10*6/uL — ABNORMAL LOW (ref 3.90–5.10)
RDW: 14 % (ref 11–15)
WBC: 6.55 10*3/uL (ref 3.10–9.50)

## 2018-09-26 LAB — IRON PROFILE
Iron Saturation: 14 % — ABNORMAL LOW (ref 15–50)
Iron: 55 ug/dL (ref 40–145)
TIBC: 382 ug/dL (ref 265–497)
UIBC: 327 ug/dL (ref 126–382)

## 2018-09-26 LAB — FERRITIN: Ferritin: 47.12 ng/mL (ref 4.60–204.00)

## 2018-09-26 LAB — IHS D-DIMER: D-Dimer: 0.69 ug/mL FEU (ref 0.00–0.70)

## 2018-09-26 LAB — HEMOLYSIS INDEX: Hemolysis Index: 2 (ref 0–18)

## 2018-09-26 NOTE — Progress Notes (Signed)
Transitional Care Management    CM attempted to reach patient's sister, Gavin Pound at [phone] , however no one answered; therefore CM left voice message asking to return call to [phone].    CM will follow up.    Arlee Muslim, MSW  Case Manager  [place] [facility]/[place] [facility]  [phone]

## 2018-09-27 ENCOUNTER — Encounter: Payer: Self-pay | Admitting: Hematology

## 2018-10-01 ENCOUNTER — Encounter (INDEPENDENT_AMBULATORY_CARE_PROVIDER_SITE_OTHER): Payer: Self-pay

## 2018-10-01 NOTE — Telephone Encounter (Signed)
Pt scheduled visit for 10/05

## 2018-10-01 NOTE — Progress Notes (Signed)
Transitional Care Management    How are you feeling? Gavin Pound reported that the pt is feeling fine.     Are you feeling better or worse since your hospital discharge? Gavin Pound reported that the pt is making progressing.       If so, what symptoms are you experiencing? Gavin Pound denied that the pt is experiencing any CHF signs and symptoms at this time. Please see call summary for additional details.       CHF Management-[place]--  Does the patient understand what could make their heart failure worse (diet, liquids, activity, weight, smoking, infections): Yes  Can the patient identify the signs and symptoms that CHF is worsening: Yes     Can the patient identify what to do if he/she experiences these symptoms: Yes      Wellness--    What is the patients current/history of alcohol use/abuse: Gavin Pound denied that pt uses alcohol.     What is the patients current/history of tobacco use/abuse: Gavin Pound denied that pt uses tobacco.     Does the patient need/want information on assistance programs for alcohol abuse or smoking cessation?  No    Educational materials mailed to patient: No    How often/much does the patient exercise: Pt has been walking for exercise.     Has the patient been given exercise guidance from his/her doctor:  Yes    Education on exercise provided: Yes    Is the patient having any issues with sleeping well:  No    Has the patient discussed these issues with his/her doctor:  N/A      CHF Self management/Health Maintenance--    Has the patients doctor recommended a special diet: Yes, low fat and low salt     Has the patient been instructed on low salt and/or low fat intake: Yes    If on low salt diet, can patient describe how much is 2 grams of sodium: Yes    Diet education completed: Yes    Is the patient on fluid restriction: Yes    How many ounces of fluid can the patient have a day: 48 ounces per day    Has the patient been instructed on the following:   Influenza vaccine Yes   Pneumococcal vaccine  Yes   Health record Yes   Smoking cessation/reduction N/A  Hand hygiene Yes    Advance Directives--    Has the patient participated in an advance care planning process:  No       Has the patient completed an advance care planning document:  No      Do all interested parties have a copy of the advance care planning document:  No      Does the patient want further information on advance directives:  No, Gavin Pound adamantly declined.     Educational materials mailed to patient:  No      Call Summary Notes--    CM placed call to [phone] and spoke with pt's sister, Gavin Pound for follow up on pt. Gavin Pound reported that pt continues to do well at this time. Gavin Pound stated that pt continues to check weights and blood pressures which are fine Gavin Pound did not report actual readings). Gavin Pound denied that the pt is experiencing any increased SOB and swelling. Gavin Pound stated that she and family keep close contact with pt's providers and happy with pt's progress. Gavin Pound has no additional questions at this time. Gavin Pound has my information and knows she can call.     CM will  follow up.    Darnelle Bos, MSW  Case Manager  [place] [facility]  [phone]

## 2018-10-03 ENCOUNTER — Encounter (FREE_STANDING_LABORATORY_FACILITY): Payer: Medicare Other

## 2018-10-03 DIAGNOSIS — I5022 Chronic systolic (congestive) heart failure: Secondary | ICD-10-CM

## 2018-10-03 LAB — BASIC METABOLIC PANEL
Anion Gap: 13 (ref 5.0–15.0)
BUN: 17 mg/dL (ref 7.0–19.0)
CO2: 17 mEq/L — ABNORMAL LOW (ref 21–29)
Calcium: 8.7 mg/dL (ref 7.9–10.2)
Chloride: 110 mEq/L (ref 100–111)
Creatinine: 1.6 mg/dL — ABNORMAL HIGH (ref 0.4–1.5)
Glucose: 173 mg/dL — ABNORMAL HIGH (ref 70–100)
Potassium: 4.3 mEq/L (ref 3.5–5.1)
Sodium: 140 mEq/L (ref 136–145)

## 2018-10-03 LAB — CBC AND DIFFERENTIAL
Absolute NRBC: 0 10*3/uL (ref 0.00–0.00)
Basophils Absolute Automated: 0.06 10*3/uL (ref 0.00–0.08)
Basophils Automated: 0.9 %
Eosinophils Absolute Automated: 0.22 10*3/uL (ref 0.00–0.44)
Eosinophils Automated: 3.4 %
Hematocrit: 30.6 % — ABNORMAL LOW (ref 34.7–43.7)
Hgb: 9.7 g/dL — ABNORMAL LOW (ref 11.4–14.8)
Immature Granulocytes Absolute: 0.01 10*3/uL (ref 0.00–0.07)
Immature Granulocytes: 0.2 %
Lymphocytes Absolute Automated: 2.03 10*3/uL (ref 0.42–3.22)
Lymphocytes Automated: 31.7 %
MCH: 29.8 pg (ref 25.1–33.5)
MCHC: 31.7 g/dL (ref 31.5–35.8)
MCV: 93.9 fL (ref 78.0–96.0)
MPV: 10.9 fL (ref 8.9–12.5)
Monocytes Absolute Automated: 0.86 10*3/uL — ABNORMAL HIGH (ref 0.21–0.85)
Monocytes: 13.4 %
Neutrophils Absolute: 3.23 10*3/uL (ref 1.10–6.33)
Neutrophils: 50.4 %
Nucleated RBC: 0 /100 WBC (ref 0.0–0.0)
Platelets: 278 10*3/uL (ref 142–346)
RBC: 3.26 10*6/uL — ABNORMAL LOW (ref 3.90–5.10)
RDW: 13 % (ref 11–15)
WBC: 6.41 10*3/uL (ref 3.10–9.50)

## 2018-10-03 LAB — HEMOLYSIS INDEX: Hemolysis Index: 3 (ref 0–18)

## 2018-10-03 LAB — GFR: EGFR: 31.5

## 2018-10-04 ENCOUNTER — Encounter (INDEPENDENT_AMBULATORY_CARE_PROVIDER_SITE_OTHER): Payer: Self-pay

## 2018-10-04 ENCOUNTER — Telehealth: Payer: Self-pay | Admitting: Hematology

## 2018-10-04 NOTE — Telephone Encounter (Signed)
Order faxed to the number provided below. Confirmed with patient that her home health agency is .

## 2018-10-04 NOTE — Telephone Encounter (Signed)
Please order repeat CBC, BMP. To be drawn by her home health on Monday/Tuesday.  Please fax to  [phone]    thanks

## 2018-10-07 ENCOUNTER — Encounter (FREE_STANDING_LABORATORY_FACILITY): Payer: Medicare Other

## 2018-10-07 DIAGNOSIS — N183 Chronic kidney disease, stage 3 (moderate): Secondary | ICD-10-CM

## 2018-10-07 DIAGNOSIS — I82622 Acute embolism and thrombosis of deep veins of left upper extremity: Secondary | ICD-10-CM

## 2018-10-07 LAB — CBC AND DIFFERENTIAL
Absolute NRBC: 0 10*3/uL (ref 0.00–0.00)
Basophils Absolute Automated: 0.04 10*3/uL (ref 0.00–0.08)
Basophils Automated: 0.6 %
Eosinophils Absolute Automated: 0.23 10*3/uL (ref 0.00–0.44)
Eosinophils Automated: 3.5 %
Hematocrit: 29 % — ABNORMAL LOW (ref 34.7–43.7)
Hgb: 9 g/dL — ABNORMAL LOW (ref 11.4–14.8)
Immature Granulocytes Absolute: 0.02 10*3/uL (ref 0.00–0.07)
Immature Granulocytes: 0.3 %
Lymphocytes Absolute Automated: 1.98 10*3/uL (ref 0.42–3.22)
Lymphocytes Automated: 30.3 %
MCH: 29.4 pg (ref 25.1–33.5)
MCHC: 31 g/dL — ABNORMAL LOW (ref 31.5–35.8)
MCV: 94.8 fL (ref 78.0–96.0)
MPV: 11.1 fL (ref 8.9–12.5)
Monocytes Absolute Automated: 0.81 10*3/uL (ref 0.21–0.85)
Monocytes: 12.4 %
Neutrophils Absolute: 3.45 10*3/uL (ref 1.10–6.33)
Neutrophils: 52.9 %
Nucleated RBC: 0 /100 WBC (ref 0.0–0.0)
Platelets: 243 10*3/uL (ref 142–346)
RBC: 3.06 10*6/uL — ABNORMAL LOW (ref 3.90–5.10)
RDW: 13 % (ref 11–15)
WBC: 6.53 10*3/uL (ref 3.10–9.50)

## 2018-10-07 LAB — BASIC METABOLIC PANEL
Anion Gap: 13 (ref 5.0–15.0)
BUN: 14 mg/dL (ref 7.0–19.0)
CO2: 18 mEq/L — ABNORMAL LOW (ref 21–29)
Calcium: 8.6 mg/dL (ref 7.9–10.2)
Chloride: 107 mEq/L (ref 100–111)
Creatinine: 1.3 mg/dL (ref 0.4–1.5)
Glucose: 160 mg/dL — ABNORMAL HIGH (ref 70–100)
Potassium: 3.9 mEq/L (ref 3.5–5.1)
Sodium: 138 mEq/L (ref 136–145)

## 2018-10-07 LAB — GFR: EGFR: 40

## 2018-10-07 LAB — HEMOLYSIS INDEX: Hemolysis Index: 3 (ref 0–18)

## 2018-10-08 ENCOUNTER — Telehealth: Payer: Self-pay | Admitting: Hematology

## 2018-10-08 DIAGNOSIS — D508 Other iron deficiency anemias: Secondary | ICD-10-CM

## 2018-10-08 NOTE — Progress Notes (Signed)
Transitional Care Management    CM placed call to [phone] and spoke with pt's sister, Gavin Pound for final follow up on pt. Gavin Pound reported that the pt is doing well. Gavin Pound denied that the pt is experiencing any shortness of breath and is fine. Gavin Pound has no additional questions at this time. Gavin Pound has my information and knows she can call for any future concerns.    TCM case will be closed on 10/09/2018 as all pillars have been completed.    Arlee Muslim, MSW  Case Manager  [place] [facility]/[place] [facility]  [phone]

## 2018-10-08 NOTE — Addendum Note (Signed)
Addended by: Ladell Pier on: 10/08/2018 04:49 PM     Modules accepted: Orders

## 2018-10-08 NOTE — Telephone Encounter (Signed)
Repeat CBC this Thursday, please fax requisition to this [facility]: (please order CBC as stat)    [address], [place], [place] [address]

## 2018-10-08 NOTE — Telephone Encounter (Signed)
CBC ordered and faxed to [facility] [phone]

## 2018-10-09 ENCOUNTER — Encounter: Payer: Self-pay | Admitting: Hematology

## 2018-10-09 ENCOUNTER — Telehealth: Payer: Self-pay | Admitting: Hematology

## 2018-10-09 DIAGNOSIS — I82622 Acute embolism and thrombosis of deep veins of left upper extremity: Secondary | ICD-10-CM

## 2018-10-09 DIAGNOSIS — D508 Other iron deficiency anemias: Secondary | ICD-10-CM

## 2018-10-09 NOTE — Telephone Encounter (Signed)
Sorry can you please add iron studies, ferritin, and BMP for this patient ; and fax requisition to lab in proir message. Thanks much

## 2018-10-09 NOTE — Telephone Encounter (Signed)
Cbc and below labs faxed to [facility] @ [phone]  Message postponed to f/u on Thursday CBC results - risk of low hgb

## 2018-10-10 ENCOUNTER — Other Ambulatory Visit: Payer: Self-pay | Admitting: Diagnostic Radiology

## 2018-10-10 ENCOUNTER — Other Ambulatory Visit: Payer: Self-pay | Admitting: Hematology

## 2018-10-10 NOTE — Telephone Encounter (Signed)
Dr Zollie Pee spoke with patient   Labs to be drawn today(thursday)

## 2018-10-11 ENCOUNTER — Ambulatory Visit (INDEPENDENT_AMBULATORY_CARE_PROVIDER_SITE_OTHER): Payer: Self-pay | Admitting: Nurse Practitioner

## 2018-10-11 LAB — BASIC METABOLIC PANEL
BUN / Creatinine Ratio: 9 — ABNORMAL LOW (ref 12–28)
BUN: 13 mg/dL (ref 8–27)
CO2: 19 mmol/L — ABNORMAL LOW (ref 20–29)
Calcium: 9.9 mg/dL (ref 8.7–10.3)
Chloride: 103 mmol/L (ref 96–106)
Creatinine: 1.4 mg/dL — ABNORMAL HIGH (ref 0.57–1.00)
EGFR: 37 mL/min/{1.73_m2} — ABNORMAL LOW (ref >59)
EGFR: 43 mL/min/{1.73_m2} — ABNORMAL LOW (ref >59)
Glucose: 295 mg/dL — ABNORMAL HIGH (ref 65–99)
Potassium: 4.9 mmol/L (ref 3.5–5.2)
Sodium: 141 mmol/L (ref 134–144)

## 2018-10-11 LAB — AMBIG ABBREV CBC/DIFF DEFAULT
Baso(Absolute): 0.1 10*3/uL (ref 0.0–0.2)
Basos: 1 %
Eos: 3 %
Eosinophils Absolute: 0.2 10*3/uL (ref 0.0–0.4)
Hematocrit: 31.3 % — ABNORMAL LOW (ref 34.0–46.6)
Hemoglobin: 10.2 g/dL — ABNORMAL LOW (ref 11.1–15.9)
Immature Granulocytes Absolute: 0 10*3/uL (ref 0.0–0.1)
Immature Granulocytes: 0 %
Lymphocytes Absolute: 1.9 10*3/uL (ref 0.7–3.1)
Lymphocytes: 28 %
MCH: 30 pg (ref 26.6–33.0)
MCHC: 32.6 g/dL (ref 31.5–35.7)
MCV: 92 fL (ref 79–97)
Monocytes Absolute: 0.8 10*3/uL (ref 0.1–0.9)
Monocytes: 11 %
Neutrophils Absolute: 3.9 10*3/uL (ref 1.4–7.0)
Neutrophils: 57 %
Platelets: 269 10*3/uL (ref 150–450)
RBC: 3.4 x10E6/uL — ABNORMAL LOW (ref 3.77–5.28)
RDW: 12 % (ref 11.7–15.4)
WBC: 6.9 10*3/uL (ref 3.4–10.8)

## 2018-10-11 LAB — FERRITIN: Ferritin: 34 ng/mL (ref 15–150)

## 2018-10-11 LAB — IRON PROFILE
Iron Saturation: 16 % (ref 15–55)
Iron: 58 ug/dL (ref 27–139)
TIBC: 362 ug/dL (ref 250–450)
UIBC: 304 ug/dL (ref 118–369)

## 2018-10-11 MED ORDER — ENOXAPARIN SODIUM 60 MG/0.6ML SC SOLN
1.00 mg/kg | Freq: Two times a day (BID) | SUBCUTANEOUS | 3 refills | Status: AC
Start: 2018-10-11 — End: 2018-11-10

## 2018-10-11 NOTE — Telephone Encounter (Signed)
Can you please inform patient of the CBC results, and other lab test results. I am pleased that the hemoglobin is better/stable. She can actually resume her full dose lovenox. Also, her creatinine is a bit elevated, please encourage her to hydrate. Iron studies normal. I do not have ultrasound report yet. Will await that. I will inform her GI doc about above.    She should have repeat CBC performed next week (appx wed or thurs), plz order and see if pt wants to obtain here or from her home nursing. Thanks much,    NB

## 2018-10-11 NOTE — Telephone Encounter (Signed)
Received call from [facility] with today's CBC results:  Hgb 10.2  Hct 31.3

## 2018-10-11 NOTE — Telephone Encounter (Signed)
Spoke with pt regarding lab results, encouraged increased fluids for the elevated creatinine. Pt verbalized understanding.    Confirmed with Dr. Zollie Pee to resume Lovenox 60mg /0.41ml  BID until results are in for [place]. Will refill rx.     CBC scheduled for 10/16/18 @ 9:40am, will send message through [place] with appointment info.

## 2018-10-16 ENCOUNTER — Other Ambulatory Visit (FREE_STANDING_LABORATORY_FACILITY): Payer: Medicare Other

## 2018-10-16 ENCOUNTER — Telehealth: Payer: Self-pay | Admitting: Hematology

## 2018-10-16 DIAGNOSIS — D508 Other iron deficiency anemias: Secondary | ICD-10-CM

## 2018-10-16 LAB — CBC AND DIFFERENTIAL
Absolute NRBC: 0 10*3/uL (ref 0.00–0.00)
Basophils Absolute Automated: 0.07 10*3/uL (ref 0.00–0.08)
Basophils Automated: 1 %
Eosinophils Absolute Automated: 0.29 10*3/uL (ref 0.00–0.44)
Eosinophils Automated: 4.3 %
Hematocrit: 32.6 % — ABNORMAL LOW (ref 34.7–43.7)
Hgb: 10.3 g/dL — ABNORMAL LOW (ref 11.4–14.8)
Immature Granulocytes Absolute: 0.03 10*3/uL (ref 0.00–0.07)
Immature Granulocytes: 0.4 %
Lymphocytes Absolute Automated: 1.9 10*3/uL (ref 0.42–3.22)
Lymphocytes Automated: 27.9 %
MCH: 29 pg (ref 25.1–33.5)
MCHC: 31.6 g/dL (ref 31.5–35.8)
MCV: 91.8 fL (ref 78.0–96.0)
MPV: 11.4 fL (ref 8.9–12.5)
Monocytes Absolute Automated: 1 10*3/uL — ABNORMAL HIGH (ref 0.21–0.85)
Monocytes: 14.7 %
Neutrophils Absolute: 3.51 10*3/uL (ref 1.10–6.33)
Neutrophils: 51.7 %
Nucleated RBC: 0 /100 WBC (ref 0.0–0.0)
Platelets: 285 10*3/uL (ref 142–346)
RBC: 3.55 10*6/uL — ABNORMAL LOW (ref 3.90–5.10)
RDW: 12 % (ref 11–15)
WBC: 6.8 10*3/uL (ref 3.10–9.50)

## 2018-10-16 MED ORDER — APIXABAN 5 MG PO TABS
5.0000 mg | ORAL_TABLET | Freq: Two times a day (BID) | ORAL | 11 refills | Status: DC
Start: 2018-10-16 — End: 2018-11-13

## 2018-10-16 NOTE — Telephone Encounter (Signed)
-  Hemoglobin is very stable. We discussed switching over to Eliquis 5mg  BID from Lovenox therapeutic. Order placed. Patient will contact if any concerns/issues with obtaining medication or side effects.     -CBC either at [facility] st or [facility] Wed 8/12 (already ordered); please provide patient appt time     thx    NB

## 2018-10-16 NOTE — Telephone Encounter (Signed)
Spoke with pt, lab appt set for 10/23/18 @ 9:30 am. Pt verbalized understanding.

## 2018-10-22 ENCOUNTER — Other Ambulatory Visit: Payer: Self-pay | Admitting: Nurse Practitioner

## 2018-10-22 DIAGNOSIS — E1121 Type 2 diabetes mellitus with diabetic nephropathy: Secondary | ICD-10-CM

## 2018-10-23 ENCOUNTER — Ambulatory Visit (INDEPENDENT_AMBULATORY_CARE_PROVIDER_SITE_OTHER): Payer: Self-pay

## 2018-10-23 ENCOUNTER — Other Ambulatory Visit (FREE_STANDING_LABORATORY_FACILITY): Payer: Medicare Other

## 2018-10-23 ENCOUNTER — Encounter: Payer: Self-pay | Admitting: Hematology

## 2018-10-23 DIAGNOSIS — E119 Type 2 diabetes mellitus without complications: Secondary | ICD-10-CM

## 2018-10-23 DIAGNOSIS — D508 Other iron deficiency anemias: Secondary | ICD-10-CM

## 2018-10-23 LAB — CBC AND DIFFERENTIAL
Absolute NRBC: 0 10*3/uL (ref 0.00–0.00)
Basophils Absolute Automated: 0.06 10*3/uL (ref 0.00–0.08)
Basophils Automated: 0.7 %
Eosinophils Absolute Automated: 0.38 10*3/uL (ref 0.00–0.44)
Eosinophils Automated: 4.7 %
Hematocrit: 34.1 % — ABNORMAL LOW (ref 34.7–43.7)
Hgb: 10.6 g/dL — ABNORMAL LOW (ref 11.4–14.8)
Immature Granulocytes Absolute: 0.03 10*3/uL (ref 0.00–0.07)
Immature Granulocytes: 0.4 %
Lymphocytes Absolute Automated: 2.05 10*3/uL (ref 0.42–3.22)
Lymphocytes Automated: 25.2 %
MCH: 28.6 pg (ref 25.1–33.5)
MCHC: 31.1 g/dL — ABNORMAL LOW (ref 31.5–35.8)
MCV: 91.9 fL (ref 78.0–96.0)
MPV: 10.9 fL (ref 8.9–12.5)
Monocytes Absolute Automated: 0.93 10*3/uL — ABNORMAL HIGH (ref 0.21–0.85)
Monocytes: 11.5 %
Neutrophils Absolute: 4.67 10*3/uL (ref 1.10–6.33)
Neutrophils: 57.5 %
Nucleated RBC: 0 /100 WBC (ref 0.0–0.0)
Platelets: 312 10*3/uL (ref 142–346)
RBC: 3.71 10*6/uL — ABNORMAL LOW (ref 3.90–5.10)
RDW: 12 % (ref 11–15)
WBC: 8.12 10*3/uL (ref 3.10–9.50)

## 2018-10-23 LAB — LIPID PANEL
Cholesterol / HDL Ratio: 3.8
Cholesterol: 161 mg/dL (ref 0–199)
HDL: 42 mg/dL (ref 40–9999)
LDL Calculated: 74 mg/dL (ref 0–99)
Triglycerides: 223 mg/dL — ABNORMAL HIGH (ref 34–149)
VLDL Calculated: 45 mg/dL — ABNORMAL HIGH (ref 10–40)

## 2018-10-23 LAB — HEMOLYSIS INDEX: Hemolysis Index: 3 (ref 0–18)

## 2018-10-24 ENCOUNTER — Encounter: Payer: Self-pay | Admitting: Hematology

## 2018-10-30 ENCOUNTER — Other Ambulatory Visit (FREE_STANDING_LABORATORY_FACILITY): Payer: Medicare Other

## 2018-10-30 ENCOUNTER — Encounter: Payer: Self-pay | Admitting: Hematology

## 2018-10-30 ENCOUNTER — Telehealth: Payer: Self-pay | Admitting: Hematology

## 2018-10-30 DIAGNOSIS — I82622 Acute embolism and thrombosis of deep veins of left upper extremity: Secondary | ICD-10-CM

## 2018-10-30 DIAGNOSIS — D508 Other iron deficiency anemias: Secondary | ICD-10-CM

## 2018-10-30 DIAGNOSIS — D509 Iron deficiency anemia, unspecified: Secondary | ICD-10-CM | POA: Insufficient documentation

## 2018-10-30 LAB — CBC AND DIFFERENTIAL
Absolute NRBC: 0 10*3/uL (ref 0.00–0.00)
Basophils Absolute Automated: 0.07 10*3/uL (ref 0.00–0.08)
Basophils Automated: 1 %
Eosinophils Absolute Automated: 0.36 10*3/uL (ref 0.00–0.44)
Eosinophils Automated: 4.9 %
Hematocrit: 31.7 % — ABNORMAL LOW (ref 34.7–43.7)
Hgb: 10.3 g/dL — ABNORMAL LOW (ref 11.4–14.8)
Immature Granulocytes Absolute: 0.03 10*3/uL (ref 0.00–0.07)
Immature Granulocytes: 0.4 %
Lymphocytes Absolute Automated: 1.85 10*3/uL (ref 0.42–3.22)
Lymphocytes Automated: 25.2 %
MCH: 29.4 pg (ref 25.1–33.5)
MCHC: 32.5 g/dL (ref 31.5–35.8)
MCV: 90.6 fL (ref 78.0–96.0)
MPV: 10.6 fL (ref 8.9–12.5)
Monocytes Absolute Automated: 0.8 10*3/uL (ref 0.21–0.85)
Monocytes: 10.9 %
Neutrophils Absolute: 4.24 10*3/uL (ref 1.10–6.33)
Neutrophils: 57.6 %
Nucleated RBC: 0 /100 WBC (ref 0.0–0.0)
Platelets: 279 10*3/uL (ref 142–346)
RBC: 3.5 10*6/uL — ABNORMAL LOW (ref 3.90–5.10)
RDW: 12 % (ref 11–15)
WBC: 7.35 10*3/uL (ref 3.10–9.50)

## 2018-10-30 LAB — BASIC METABOLIC PANEL
Anion Gap: 14 (ref 5.0–15.0)
BUN: 31 mg/dL — ABNORMAL HIGH (ref 7.0–19.0)
CO2: 18 mEq/L — ABNORMAL LOW (ref 21–29)
Calcium: 9.3 mg/dL (ref 7.9–10.2)
Chloride: 107 mEq/L (ref 100–111)
Creatinine: 1.8 mg/dL — ABNORMAL HIGH (ref 0.4–1.5)
Glucose: 254 mg/dL — ABNORMAL HIGH (ref 70–100)
Potassium: 4.7 mEq/L (ref 3.5–5.1)
Sodium: 139 mEq/L (ref 136–145)

## 2018-10-30 LAB — GFR: EGFR: 27.5

## 2018-10-30 LAB — IRON PROFILE
Iron Saturation: 15 % (ref 15–50)
Iron: 61 ug/dL (ref 40–145)
TIBC: 396 ug/dL (ref 265–497)
UIBC: 335 ug/dL (ref 126–382)

## 2018-10-30 LAB — HEMOLYSIS INDEX: Hemolysis Index: 11 (ref 0–18)

## 2018-10-30 LAB — IHS D-DIMER: D-Dimer: 0.32 ug/mL FEU (ref 0.00–0.70)

## 2018-10-30 LAB — FERRITIN: Ferritin: 16.99 ng/mL (ref 4.60–204.00)

## 2018-10-30 NOTE — Telephone Encounter (Signed)
Most recent labs confirm iron deficiency anemia.     Patient will require Injectafer 750mg  IV x 1 dose    I sent her a long Mychart message explaining this.     Thanks    NB

## 2018-10-31 ENCOUNTER — Other Ambulatory Visit: Payer: Self-pay

## 2018-10-31 ENCOUNTER — Other Ambulatory Visit (HOSPITAL_BASED_OUTPATIENT_CLINIC_OR_DEPARTMENT_OTHER): Payer: Self-pay | Admitting: Hematology

## 2018-10-31 DIAGNOSIS — K9049 Malabsorption due to intolerance, not elsewhere classified: Secondary | ICD-10-CM | POA: Insufficient documentation

## 2018-10-31 NOTE — Progress Notes (Signed)
Treatment reviewed and plan entered. Request for authorization submitted to insurance team.

## 2018-10-31 NOTE — Progress Notes (Signed)
AUTH REQUEST: INJECTAFER 750 mg IV x 1    DX: D50.9, K90.4    DATE: 11/06/18    PROVIDER: DR. Carolynn Comment

## 2018-10-31 NOTE — Telephone Encounter (Signed)
Auth request submitted

## 2018-11-04 ENCOUNTER — Other Ambulatory Visit: Payer: Self-pay | Admitting: Nurse Practitioner

## 2018-11-04 DIAGNOSIS — E1121 Type 2 diabetes mellitus with diabetic nephropathy: Secondary | ICD-10-CM

## 2018-11-04 DIAGNOSIS — I340 Nonrheumatic mitral (valve) insufficiency: Secondary | ICD-10-CM

## 2018-11-05 ENCOUNTER — Ambulatory Visit
Admission: RE | Admit: 2018-11-05 | Discharge: 2018-11-05 | Disposition: A | Discharge status: Home / Self Care | Payer: Medicare Other | Source: Ambulatory Visit | Attending: Nurse Practitioner | Admitting: Nurse Practitioner

## 2018-11-05 ENCOUNTER — Other Ambulatory Visit (INDEPENDENT_AMBULATORY_CARE_PROVIDER_SITE_OTHER): Payer: Self-pay

## 2018-11-05 DIAGNOSIS — I119 Hypertensive heart disease without heart failure: Secondary | ICD-10-CM | POA: Insufficient documentation

## 2018-11-05 DIAGNOSIS — E119 Type 2 diabetes mellitus without complications: Secondary | ICD-10-CM | POA: Insufficient documentation

## 2018-11-05 DIAGNOSIS — I340 Nonrheumatic mitral (valve) insufficiency: Secondary | ICD-10-CM | POA: Insufficient documentation

## 2018-11-05 DIAGNOSIS — I5022 Chronic systolic (congestive) heart failure: Secondary | ICD-10-CM | POA: Insufficient documentation

## 2018-11-05 DIAGNOSIS — E1121 Type 2 diabetes mellitus with diabetic nephropathy: Secondary | ICD-10-CM

## 2018-11-05 DIAGNOSIS — I428 Other cardiomyopathies: Secondary | ICD-10-CM | POA: Insufficient documentation

## 2018-11-05 DIAGNOSIS — I447 Left bundle-branch block, unspecified: Secondary | ICD-10-CM | POA: Insufficient documentation

## 2018-11-05 NOTE — Progress Notes (Signed)
Ok to proceed with treatment

## 2018-11-05 NOTE — Progress Notes (Signed)
Tara Perry is working on this.

## 2018-11-05 NOTE — Telephone Encounter (Signed)
Injectafer scheduled for 11/13/18 @ 2:30 pm at [facility].     Left VM for pt to notify her of above appt.

## 2018-11-07 ENCOUNTER — Other Ambulatory Visit: Payer: Self-pay | Admitting: Hematology

## 2018-11-08 ENCOUNTER — Encounter: Payer: Self-pay | Admitting: Hematology

## 2018-11-13 ENCOUNTER — Ambulatory Visit: Payer: TRICARE For Life (TFL)

## 2018-11-13 ENCOUNTER — Other Ambulatory Visit: Payer: Self-pay

## 2018-11-13 DIAGNOSIS — I82622 Acute embolism and thrombosis of deep veins of left upper extremity: Secondary | ICD-10-CM

## 2018-11-13 MED ORDER — APIXABAN 5 MG PO TABS
5.0000 mg | ORAL_TABLET | Freq: Two times a day (BID) | ORAL | 11 refills | Status: DC
Start: 2018-11-13 — End: 2019-11-08

## 2018-11-20 LAB — VAHRT HISTORIC LVEF: Ejection Fraction: 40 %

## 2018-11-21 ENCOUNTER — Ambulatory Visit (INDEPENDENT_AMBULATORY_CARE_PROVIDER_SITE_OTHER): Payer: Self-pay | Admitting: Cardiovascular Disease

## 2018-12-16 ENCOUNTER — Telehealth: Payer: Self-pay | Admitting: Hematology

## 2018-12-16 ENCOUNTER — Ambulatory Visit: Payer: Medicare Other | Attending: Hematology | Admitting: Hematology

## 2018-12-16 ENCOUNTER — Encounter: Payer: Self-pay | Admitting: Hematology

## 2018-12-16 DIAGNOSIS — D508 Other iron deficiency anemias: Secondary | ICD-10-CM | POA: Insufficient documentation

## 2018-12-16 DIAGNOSIS — I82622 Acute embolism and thrombosis of deep veins of left upper extremity: Secondary | ICD-10-CM | POA: Insufficient documentation

## 2018-12-16 NOTE — Telephone Encounter (Signed)
Pt will get labs done at [address]. Pt will go next week for labs.    Please schedule RTC for 1st week of November.

## 2018-12-16 NOTE — Telephone Encounter (Signed)
Spoke with patient and she has been scheduled for 01/15/2019 at 2 pm.

## 2018-12-16 NOTE — Telephone Encounter (Signed)
Labs ordered, plz schedule with patient for lab appt    rtc 1st week November telehealth

## 2018-12-16 NOTE — Progress Notes (Signed)
VIDEO VISIT - [facility]      CONSENT: Verbal consent has been obtained from the patient to conduct a video visit encounter to minimize exposure to COVID-19: yes    Total time spent face to face with patient: 20 minutes.  Time spent on counseling/coordination of care: > 50%.  Regarding: rpv, LUE provoked DVT , RUE DVT     HPI/HEMATOLOGY HISTORY: Tara Perry was seen today via video visit in hematology follow up.  She is a 73 y.o. female with history of DM, NICM, CKD, UE VTE events.    Patient was admitted 6/5-6/16/20 for VF cardiac arrest- underwent hypothermic protocol. s/p BiV ICD placement 08/26/18. She presented to [facility] 08/30/18 with LUE swelling. She was found to have acute subclavian DVT. She was started on Lovenox therapeutic. Her course was complicated by hematemesis on 09/01/18. EGD showed large duodenal ulcer with visible vessels s/p cauterization, clipping. Also noted to have RUE extensive R basilic occlusive thrombus and phlebitis 09/06/18. She resumed anticoagulation and was discharged on Lovenox 1mg /kg BID after stability of hemoglobin on a/c.     INTERVAL HISTORY:     Switched to Eliquis 5mg  BID and has been tolerating it well since then. No bleeding, and specifically no melena or hematochezia. No heartburn or other GI symptoms. Occasional discomfort around ICD on skin.    Has been having more energy. Walking twice per day. No oral iron and didn't get the IV iron infusion due to concern of potential side effects. Has increased dietary iron intake.     MEDICATIONS/ALLERGIES:     Outpatient Medications Marked as Taking for the 12/16/18 encounter (Telemedicine Visit) with Russ Halo, MD   Medication Sig Dispense Refill    apixaban (ELIQUIS) 5 MG Take 1 tablet (5 mg total) by mouth every 12 (twelve) hours 60 tablet 11    atorvastatin (LIPITOR) 40 MG tablet 80 mg Per patient she is on 80 mg.        carvedilol (COREG) 6.25 MG tablet       Entresto 24-26 MG Tab per tablet        Jardiance 25 MG tablet       metFORMIN (GLUCOPHAGE) 500 MG tablet Take 1 tablet (500 mg total) by mouth 2 (two) times daily Morning and night      Multiple Vitamins-Minerals (CENTRUM PO) Take by mouth.        spironolactone (ALDACTONE) 25 MG tablet Take 1 tablet (25 mg total) by mouth daily 30 tablet 0    vitamin B-12 (CYANOCOBALAMIN) 100 MCG tablet Take 1,000 mcg by mouth daily          Allergies   Allergen Reactions    Shellfish-Derived Products Anaphylaxis     PAST HISTORY:    Past Medical History:   Diagnosis Date    Atrophic vaginitis 08/30/2018    Cardiac arrest 08/17/2018    VF arrest. She received 3 shocks, 1 round of epi with successful ROSC. 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/2018    per patient, he only sees peripheral view    Diabetes mellitus 1992    Gout spondylitis     Gout synovitis     LBBB (left bundle branch block)     LBBB chronic    NICM (nonischemic cardiomyopathy) 08/2018    NICM, EF 20%  Respiratory failure 08/2018    Hypoxic/anoxic respiratory failure, extubated 08/20/2018    Type 2 diabetes mellitus, controlled     Viral cardiomyopathy 2008        Past Surgical History:   Procedure Laterality Date    COLONOSCOPY  2002    normal    EGD N/A 09/01/2018    Procedure: EGD;  Surgeon: Lilia Pro, MD;  Location: [id number];  Service: Gastroenterology;  Laterality: N/A;    EXPLORATORY LAPAROTOMY      ICD IMPLANT BIV N/A 08/26/2018    Procedure: ICD IMPLANT BIV;  Surgeon: Len Childs, MD;  Location: [facility];  Service: Cardiovascular;  Laterality: N/A;  medtronic    LHC W/ CORONARY ANGIOS AND LV Left 08/21/2018    Procedure: LHC W/ CORONARY ANGIOS AND LV;  Surgeon: Burna Forts, MD;  Location: [facility];  Service: Cardiovascular;  Laterality: Left;       Family History   Problem Relation Age of Onset    Cancer Father 64        bladder        Social History     Socioeconomic History    Marital status: Widowed     Spouse name: Not on file    Number of children: Not on file    Years of education: Not on file    Highest education level: Not on file   Occupational History    Not on file   Social Needs    Financial resource strain: Not on file    Food insecurity     Worry: Not on file     Inability: Not on file    Transportation needs     Medical: Not on file     Non-medical: Not on file   Tobacco Use    Smoking status: Never Smoker    Smokeless tobacco: Never Used   Substance and Sexual Activity    Alcohol use: Yes     Comment: social    Drug use: No    Sexual activity: Not on file   Lifestyle    Physical activity     Days per week: Not on file     Minutes per session: Not on file    Stress: Not on file   Relationships    Social connections     Talks on phone: Not on file     Gets together: Not on file     Attends religious service: Not on file     Active member of club or organization: Not on file     Attends meetings of clubs or organizations: Not on file     Relationship status: Not on file    Intimate partner violence     Fear of current or ex partner: Not on file     Emotionally abused: Not on file     Physically abused: Not on file     Forced sexual activity: Not on file   Other Topics Concern    Not on file   Social History Narrative    Not on file       Patient Active Problem List   Diagnosis    Peritonitis    Type II diabetes mellitus without mention of complication    Gout synovitis    Type 2 diabetes mellitus without complications    Viral cardiomyopathy    CHF (congestive heart failure)    Cardiac arrest    Atrophic vaginitis    Chronic  vulvitis    Arm DVT (deep venous thromboembolism), acute, left    Iron deficiency anemia    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   Answers for HPI/ROS submitted by the patient  on 12/15/2018   Symptoms  Unexpected weight loss: No  Unexpected weight gain: No  fever: No  fatigue: No  Night sweats: No  Hot flashes: No  rash: No  itching: No  blurred vision: No  hearing loss: No  Ringing in ears: No  Bleeding gums: No  Sinus pain: No  sore throat: No  Voice change: No  chest pain: No  palpitations: No  difficulty breathing: No  Pain with breathing: No  Chronic cough: No  Bloody sputum: No  wheezing: No  Snoring: No  Appetite change: No  nausea: No  vomiting: No  diarrhea: No  Rectal bleeding: No  Bloody stool: No  constipation: No  heartburn: No  Vaginal dryness: No  Urinary frequency: No  Blood in urine: No  bladder incontinence: No  Change in urinary stream: No  Difficulty walking: No  headaches: No  numbness: No  tingling: No  dizziness: No  seizures: No  tremors: No  Memory Loss: No  confusion: No  mood changes: No  Depressed mood: No  Difficulty sleeping: No  nervous/anxious: No  Bruise or bleeds easily: No  Have you ever used birth control pills? If Yes, please indicate with the number of years:: Yes- no idea      PHYSICAL EXAMINATION  Vital Signs: There were no vitals taken for this visit.     The following limited physical exam components were observed via video:    General Appearance:  Alert, cooperative, no distress, appears stated age   Head: Normocephalic, without obvious abnormality, atraumatic   Eyes:  Sclera anicteric, conjunctiva without pallor   Lungs:    Abdomen:   Breathing comfortably without accessory muscle use. Normal respiratory effort.   Non-distended   Musculoskeletal: No apparent gait or station abnormalities    Skin: Normal skin color, texture, no visible rashes or lesions   Extremities: No apparent edema   Neurologic:  Mental Status: Alert and oriented x3, face symmetric, tongue midline.  Normal mood and affect, cooperative, judgement intact     LABS/RADIOLOGY:     No visits with results within 1 Day(s) from this visit.   Latest known visit with results is:    Appointment on 10/30/2018   Component Date Value Ref Range Status    WBC 10/30/2018 7.35  3.10 - 9.50 x10 3/uL Final    Hgb 10/30/2018 10.3* 11.4 - 14.8 g/dL Final    Hematocrit 16/12/9602 31.7* 34.7 - 43.7 % Final    Platelets 10/30/2018 279  142 - 346 x10 3/uL Final    RBC 10/30/2018 3.50* 3.90 - 5.10 x10 6/uL Final    MCV 10/30/2018 90.6  78.0 - 96.0 fL Final    MCH 10/30/2018 29.4  25.1 - 33.5 pg Final    MCHC 10/30/2018 32.5  31.5 - 35.8 g/dL Final    RDW 54/11/8117 12  11 - 15 % Final    MPV 10/30/2018 10.6  8.9 - 12.5 fL Final    Neutrophils 10/30/2018 57.6  None % Final    Lymphocytes Automated 10/30/2018 25.2  None % Final    Monocytes 10/30/2018 10.9  None % Final    Eosinophils Automated 10/30/2018  4.9  None % Final    Basophils Automated 10/30/2018 1.0  None % Final    Immature Granulocytes 10/30/2018 0.4  None % Final    Nucleated RBC 10/30/2018 0.0  0.0 - 0.0 /100 WBC Final    Neutrophils Absolute 10/30/2018 4.24  1.10 - 6.33 x10 3/uL Final    Lymphocytes Absolute Automated 10/30/2018 1.85  0.42 - 3.22 x10 3/uL Final    Monocytes Absolute Automated 10/30/2018 0.80  0.21 - 0.85 x10 3/uL Final    Eosinophils Absolute Automated 10/30/2018 0.36  0.00 - 0.44 x10 3/uL Final    Basophils Absolute Automated 10/30/2018 0.07  0.00 - 0.08 x10 3/uL Final    Immature Granulocytes Absolute 10/30/2018 0.03  0.00 - 0.07 x10 3/uL Final    Absolute NRBC 10/30/2018 0.00  0.00 - 0.00 x10 3/uL Final    Iron 10/30/2018 61  40 - 145 ug/dL Final    UIBC 16/12/9602 335  126 - 382 ug/dL Final    TIBC 54/11/8117 396  265 - 497 ug/dL Final    Iron Saturation 10/30/2018 15  15 - 50 % Final    Ferritin 10/30/2018 16.99  4.60 - 204.00 ng/mL Final    Glucose 10/30/2018 254* 70 - 100 mg/dL Final    BUN 14/78/2956 31.0* 7.0 - 19.0 mg/dL Final    Creatinine 21/30/8657 1.8* 0.4 - 1.5 mg/dL Final    Calcium 84/69/6295 9.3  7.9 - 10.2 mg/dL Final    Sodium 28/41/3244 139  136 - 145 mEq/L Final     Potassium 10/30/2018 4.7  3.5 - 5.1 mEq/L Final    Chloride 10/30/2018 107  100 - 111 mEq/L Final    CO2 10/30/2018 18* 21 - 29 mEq/L Final    Anion Gap 10/30/2018 14.0  5.0 - 15.0 Final    Hemolysis Index 10/30/2018 11  0 - 18 Final    EGFR 10/30/2018 27.5   Final    D-Dimer 10/30/2018 0.32  0.00 - 0.70 ug/mL FEU Final     LUE Doppler U/S 09/01/18: IMPRESSION:   Extensive occlusive and nonocclusive left upper extremity DVT in the  region of left pacer wire, as detailed. Per discussion with clinical  team at the time of dictation, there are aware of this finding.    RUE Doppler U/S 09/07/18: IMPRESSION:    Extensive right basilic occlusive thrombus in associated  thrombophlebitis. Continuous flow right cephalic vein. Other findings as  above.    IMPRESSION/RECOMMENDATIONS: Tara Perry is a 73 y.o. female with   1. Other iron deficiency anemia    2. Arm DVT (deep venous thromboembolism), acute, left      1. Provoked DVT: LUE provoked dvt from pacer wires and acute illness. RUE DVT provoked in setting of line. She should complete at least 3 months therapeutic anticoagulation. Would like to repeat both L and R upper extremity ultrasounds to assess for resolution of clot. At that point, may determine whether to drop to prophylactic dosage anticoagulation or stop all together. I am inclined to use prophylactic dosage as the ICD lead wire may very well be at risk /nidus for further recurrent clot formation-- though alternately, the lead would likely be endothelialized and perhaps pose less of a risk than prior. Would also appreciate input from her cardiologist.     2. Iron defn anemia: In setting of GI bleed. Will check repeat CBC and iron studies. If iron persistently low, will have her take oral iron repletion.    Pt instructed  to send non urgent messages via MyChart and provided with [facility] clinic phone number. RTC 4 weeks.

## 2019-01-06 ENCOUNTER — Telehealth: Payer: Self-pay | Admitting: Hematology

## 2019-01-06 ENCOUNTER — Other Ambulatory Visit: Payer: Self-pay

## 2019-01-06 DIAGNOSIS — I82622 Acute embolism and thrombosis of deep veins of left upper extremity: Secondary | ICD-10-CM

## 2019-01-06 NOTE — Telephone Encounter (Signed)
Bilateral upper extremity ultrasound [facility] and [facility], pt aware.

## 2019-01-06 NOTE — Telephone Encounter (Signed)
The patient called to request an [place] order for the right arm DVT. [place] is scheduled for 01/10/19.     The order can be faxed to the [facility] at [phone].    Ph. [phone]

## 2019-01-10 ENCOUNTER — Other Ambulatory Visit: Payer: Self-pay | Admitting: Diagnostic Radiology

## 2019-01-14 ENCOUNTER — Other Ambulatory Visit (FREE_STANDING_LABORATORY_FACILITY): Payer: Medicare Other

## 2019-01-14 DIAGNOSIS — D508 Other iron deficiency anemias: Secondary | ICD-10-CM

## 2019-01-14 DIAGNOSIS — I82622 Acute embolism and thrombosis of deep veins of left upper extremity: Secondary | ICD-10-CM

## 2019-01-14 LAB — CBC AND DIFFERENTIAL
Absolute NRBC: 0 10*3/uL (ref 0.00–0.00)
Basophils Absolute Automated: 0.06 10*3/uL (ref 0.00–0.08)
Basophils Automated: 0.6 %
Eosinophils Absolute Automated: 0.23 10*3/uL (ref 0.00–0.44)
Eosinophils Automated: 2.4 %
Hematocrit: 33.3 % — ABNORMAL LOW (ref 34.7–43.7)
Hgb: 10.7 g/dL — ABNORMAL LOW (ref 11.4–14.8)
Immature Granulocytes Absolute: 0.03 10*3/uL (ref 0.00–0.07)
Immature Granulocytes: 0.3 %
Lymphocytes Absolute Automated: 2.24 10*3/uL (ref 0.42–3.22)
Lymphocytes Automated: 23.7 %
MCH: 28.5 pg (ref 25.1–33.5)
MCHC: 32.1 g/dL (ref 31.5–35.8)
MCV: 88.6 fL (ref 78.0–96.0)
MPV: 10.5 fL (ref 8.9–12.5)
Monocytes Absolute Automated: 0.95 10*3/uL — ABNORMAL HIGH (ref 0.21–0.85)
Monocytes: 10 %
Neutrophils Absolute: 5.96 10*3/uL (ref 1.10–6.33)
Neutrophils: 63 %
Nucleated RBC: 0 /100 WBC (ref 0.0–0.0)
Platelets: 259 10*3/uL (ref 142–346)
RBC: 3.76 10*6/uL — ABNORMAL LOW (ref 3.90–5.10)
RDW: 13 % (ref 11–15)
WBC: 9.47 10*3/uL (ref 3.10–9.50)

## 2019-01-14 LAB — IRON PROFILE
Iron Saturation: 20 % (ref 15–50)
Iron: 78 ug/dL (ref 40–145)
TIBC: 383 ug/dL (ref 265–497)
UIBC: 305 ug/dL (ref 126–382)

## 2019-01-14 LAB — COMPREHENSIVE METABOLIC PANEL
ALT: 12 U/L (ref 0–55)
AST (SGOT): 15 U/L (ref 5–34)
Albumin/Globulin Ratio: 1.8 (ref 0.9–2.2)
Albumin: 4.2 g/dL (ref 3.5–5.0)
Alkaline Phosphatase: 96 U/L (ref 37–106)
Anion Gap: 12 (ref 5.0–15.0)
BUN: 29 mg/dL — ABNORMAL HIGH (ref 7.0–19.0)
Bilirubin, Total: 0.3 mg/dL (ref 0.2–1.2)
CO2: 19 mEq/L — ABNORMAL LOW (ref 21–29)
Calcium: 9.3 mg/dL (ref 7.9–10.2)
Chloride: 106 mEq/L (ref 100–111)
Creatinine: 1.6 mg/dL — ABNORMAL HIGH (ref 0.4–1.5)
Globulin: 2.4 g/dL (ref 2.0–3.7)
Glucose: 231 mg/dL — ABNORMAL HIGH (ref 70–100)
Potassium: 4.4 mEq/L (ref 3.5–5.1)
Protein, Total: 6.6 g/dL (ref 6.0–8.3)
Sodium: 137 mEq/L (ref 136–145)

## 2019-01-14 LAB — IHS D-DIMER: D-Dimer: 0.37 ug/mL FEU (ref 0.00–0.70)

## 2019-01-14 LAB — HEMOLYSIS INDEX: Hemolysis Index: 5 (ref 0–18)

## 2019-01-14 LAB — GFR: EGFR: 31.5

## 2019-01-14 LAB — FERRITIN: Ferritin: 13.68 ng/mL (ref 4.60–204.00)

## 2019-01-15 ENCOUNTER — Encounter: Payer: Self-pay | Admitting: Hematology

## 2019-01-15 ENCOUNTER — Ambulatory Visit: Payer: Medicare Other | Attending: Hematology | Admitting: Hematology

## 2019-01-15 VITALS — BP 112/67 | HR 79 | Wt 124.0 lb

## 2019-01-15 DIAGNOSIS — I82622 Acute embolism and thrombosis of deep veins of left upper extremity: Secondary | ICD-10-CM | POA: Insufficient documentation

## 2019-01-15 DIAGNOSIS — D508 Other iron deficiency anemias: Secondary | ICD-10-CM | POA: Insufficient documentation

## 2019-01-15 NOTE — Progress Notes (Signed)
VIDEO VISIT - [facility]      CONSENT: Verbal consent has been obtained from the patient to conduct a video visit encounter to minimize exposure to COVID-19: yes    Total time spent face to face with patient: 20 minutes.  Time spent on counseling/coordination of care: > 50%  Regarding: rpv, LUE provoked DVT , RUE DVT     HPI/HEMATOLOGY HISTORY: Ms. Wallock was seen today via video visit in hematology follow up.  She is a 73 y.o. female with history of DM, NICM, CKD, UE VTE events.    Patient was admitted 6/5-6/16/20 for VF cardiac arrest- underwent hypothermic protocol. s/p BiV ICD placement 08/26/18. She presented to [facility] 08/30/18 with LUE swelling. She was found to have acute subclavian DVT. She was started on Lovenox therapeutic. Her course was complicated by hematemesis on 09/01/18. EGD showed large duodenal ulcer with visible vessels s/p cauterization, clipping. Also noted to have RUE extensive R basilic occlusive thrombus and phlebitis 09/06/18. She resumed anticoagulation and was discharged on Lovenox 1mg /kg BID after stability of hemoglobin on a/c.     Switched to Eliquis 5mg  BID from Lovenox in September.     INTERVAL HISTORY:     Tolerating Eliquis 5mg  BID. No bleeding, and specifically no melena or hematochezia. No heartburn or other GI symptoms. ICD site without pain etc.     Has been having more energy. Walking twice per day. No oral iron and didn't get the IV iron infusion due to concern of potential side effects. Has increased dietary iron intake.     DEXA scan overdue.    December appointment for cardiology (Dr. Nash Shearer).    MEDICATIONS/ALLERGIES:     Outpatient Medications Marked as Taking for the 01/15/19 encounter (Telemedicine Visit) with Russ Halo, MD   Medication Sig Dispense Refill    apixaban (ELIQUIS) 5 MG Take 1 tablet (5 mg total) by mouth every 12 (twelve) hours 60 tablet 11    atorvastatin (LIPITOR) 40 MG tablet 80 mg Per patient she is on 80 mg.        carvedilol  (COREG) 6.25 MG tablet       Entresto 49-51 MG Tab per tablet         Jardiance 25 MG tablet       metFORMIN (GLUCOPHAGE) 500 MG tablet Take 1 tablet (500 mg total) by mouth 2 (two) times daily Morning and night (Patient taking differently: Take 500 mg by mouth 2 (two) times daily 1000 mg Morning and 1000 mg night.  )      Multiple Vitamins-Minerals (CENTRUM PO) Take by mouth.        spironolactone (ALDACTONE) 25 MG tablet Take 1 tablet (25 mg total) by mouth daily 30 tablet 0    vitamin B-12 (CYANOCOBALAMIN) 100 MCG tablet Take 1,000 mcg by mouth daily          Allergies   Allergen Reactions    Shellfish-Derived Products Anaphylaxis     PAST HISTORY:    Past Medical History:   Diagnosis Date    Atrophic vaginitis 08/30/2018    Cardiac arrest 08/17/2018    VF arrest. She received 3 shocks, 1 round of epi with successful ROSC. 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/2018    per patient, he only sees peripheral view    Diabetes  mellitus 1992    Gout spondylitis     Gout synovitis     LBBB (left bundle branch block)     LBBB chronic    NICM (nonischemic cardiomyopathy) 08/2018    NICM, EF 20%    Respiratory failure 08/2018    Hypoxic/anoxic respiratory failure, extubated 08/20/2018    Type 2 diabetes mellitus, controlled     Viral cardiomyopathy 2008        Past Surgical History:   Procedure Laterality Date    COLONOSCOPY  2002    normal    EGD N/A 09/01/2018    Procedure: EGD;  Surgeon: Lilia Pro, MD;  Location: [facility];  Service: Gastroenterology;  Laterality: N/A;    EXPLORATORY LAPAROTOMY      ICD IMPLANT BIV N/A 08/26/2018    Procedure: ICD IMPLANT BIV;  Surgeon: Len Childs, MD;  Location: [facility];  Service: Cardiovascular;  Laterality: N/A;  medtronic    LHC W/ CORONARY ANGIOS AND LV Left 08/21/2018    Procedure: LHC W/ CORONARY ANGIOS AND LV;  Surgeon: Burna Forts, MD;  Location: [facility];  Service: Cardiovascular;  Laterality: Left;       Family History   Problem Relation Age of Onset    Cancer Father 58        bladder     Social History:   5 grandkids (84mo-19y/o)  Non smoker   Walks routinely     Patient Active Problem List   Diagnosis    Peritonitis    Type II diabetes mellitus without mention of complication    Gout synovitis    Type 2 diabetes mellitus without complications    Viral cardiomyopathy    CHF (congestive heart failure)    Cardiac arrest    Atrophic vaginitis    Chronic vulvitis    Arm DVT (deep venous thromboembolism), acute, left    Iron deficiency anemia    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   Answers for HPI/ROS submitted by the patient on 01/15/2019   Symptoms  Unexpected weight loss: No  Unexpected weight gain: No  fever: No  fatigue: No  Night sweats: No  Hot flashes: No  rash: No  itching: No  blurred vision: No  hearing loss: No  Ringing in ears: No  Bleeding gums: No  Sinus pain: No  sore throat: No  Voice change: No  chest pain: No  palpitations: No  difficulty breathing: No  Pain with breathing: No  Chronic cough: No  Bloody sputum: No  wheezing: No  Snoring: No  Appetite change: No  nausea: No  vomiting: No  diarrhea: No  Rectal bleeding: No  Bloody stool: No  constipation: No  heartburn: No  Vaginal dryness: No  Urinary frequency: No  Blood in urine: No  bladder incontinence: No  Change in urinary stream: No  Difficulty walking: No  headaches: No  numbness: No  tingling: No  dizziness: No  seizures: No  tremors: No  Memory Loss: No  confusion: No  mood changes: No  Depressed mood: No  Difficulty sleeping: No  nervous/anxious: No  Bruise or bleeds easily: No        PHYSICAL EXAMINATION  Vital Signs: There were no vitals taken for this visit.     The following limited physical exam components were observed via  video:    General Appearance:  Alert, cooperative, no distress, appears stated age   Head: Normocephalic, without obvious abnormality, atraumatic   Eyes:  Sclera anicteric, conjunctiva without pallor   Lungs:    Abdomen:   Breathing comfortably without accessory muscle use. Normal respiratory effort.   Non-distended   Musculoskeletal: No apparent gait or station abnormalities    Skin: Normal skin color, texture, no visible rashes or lesions   Extremities: No apparent edema   Neurologic:  Mental Status: Alert and oriented x3, face symmetric, tongue midline.  Normal mood and affect, cooperative, judgement intact     LABS/RADIOLOGY:     No visits with results within 1 Day(s) from this visit.   Latest known visit with results is:   Appointment on 01/14/2019   Component Date Value Ref Range Status    D-Dimer 01/14/2019 0.37  0.00 - 0.70 ug/mL FEU Final    Ferritin 01/14/2019 13.68  4.60 - 204.00 ng/mL Final    Iron 01/14/2019 78  40 - 145 ug/dL Final    UIBC 16/12/9602 305  126 - 382 ug/dL Final    TIBC 54/11/8117 383  265 - 497 ug/dL Final    Iron Saturation 01/14/2019 20  15 - 50 % Final    WBC 01/14/2019 9.47  3.10 - 9.50 x10 3/uL Final    Hgb 01/14/2019 10.7* 11.4 - 14.8 g/dL Final    Hematocrit 14/78/2956 33.3* 34.7 - 43.7 % Final    Platelets 01/14/2019 259  142 - 346 x10 3/uL Final    RBC 01/14/2019 3.76* 3.90 - 5.10 x10 6/uL Final    MCV 01/14/2019 88.6  78.0 - 96.0 fL Final    MCH 01/14/2019 28.5  25.1 - 33.5 pg Final    MCHC 01/14/2019 32.1  31.5 - 35.8 g/dL Final    RDW 21/30/8657 13  11 - 15 % Final    MPV 01/14/2019 10.5  8.9 - 12.5 fL Final    Neutrophils 01/14/2019 63.0  None % Final    Lymphocytes Automated 01/14/2019 23.7  None % Final    Monocytes 01/14/2019 10.0  None % Final    Eosinophils Automated 01/14/2019 2.4  None % Final    Basophils Automated 01/14/2019 0.6  None % Final    Immature Granulocytes 01/14/2019 0.3  None % Final    Nucleated RBC 01/14/2019 0.0  0.0 - 0.0 /100  WBC Final    Neutrophils Absolute 01/14/2019 5.96  1.10 - 6.33 x10 3/uL Final    Lymphocytes Absolute Automated 01/14/2019 2.24  0.42 - 3.22 x10 3/uL Final    Monocytes Absolute Automated 01/14/2019 0.95* 0.21 - 0.85 x10 3/uL Final    Eosinophils Absolute Automated 01/14/2019 0.23  0.00 - 0.44 x10 3/uL Final    Basophils Absolute Automated 01/14/2019 0.06  0.00 - 0.08 x10 3/uL Final    Immature Granulocytes Absolute 01/14/2019 0.03  0.00 - 0.07 x10 3/uL Final    Absolute NRBC 01/14/2019 0.00  0.00 - 0.00 x10 3/uL Final    Glucose 01/14/2019 231* 70 - 100 mg/dL Final    BUN 84/69/6295 29.0* 7.0 - 19.0 mg/dL Final    Creatinine 28/41/3244 1.6* 0.4 - 1.5 mg/dL Final    Sodium 03/15/7251 137  136 - 145 mEq/L Final    Potassium 01/14/2019 4.4  3.5 - 5.1 mEq/L Final    Chloride 01/14/2019 106  100 - 111 mEq/L Final    CO2 01/14/2019 19* 21 - 29 mEq/L Final    Calcium 01/14/2019 9.3  7.9 - 10.2  mg/dL Final    Protein, Total 01/14/2019 6.6  6.0 - 8.3 g/dL Final    Albumin 16/12/9602 4.2  3.5 - 5.0 g/dL Final    AST (SGOT) 54/11/8117 15  5 - 34 U/L Final    ALT 01/14/2019 12  0 - 55 U/L Final    Alkaline Phosphatase 01/14/2019 96  37 - 106 U/L Final    Bilirubin, Total 01/14/2019 0.3  0.2 - 1.2 mg/dL Final    Globulin 14/78/2956 2.4  2.0 - 3.7 g/dL Final    Albumin/Globulin Ratio 01/14/2019 1.8  0.9 - 2.2 Final    Anion Gap 01/14/2019 12.0  5.0 - 15.0 Final    Hemolysis Index 01/14/2019 5  0 - 18 Final    EGFR 01/14/2019 31.5   Final     LUE Doppler U/S 09/01/18: IMPRESSION:   Extensive occlusive and nonocclusive left upper extremity DVT in the  region of left pacer wire, as detailed. Per discussion with clinical  team at the time of dictation, there are aware of this finding.    RUE Doppler U/S 09/07/18: IMPRESSION:    Extensive right basilic occlusive thrombus in associated  thrombophlebitis. Continuous flow right cephalic vein. Other findings as  above.    IMPRESSION/RECOMMENDATIONS: Ms. Langlois is a  73 y.o. female with   1. Arm DVT (deep venous thromboembolism), acute, left    2. Other iron deficiency anemia      1. Provoked DVT: LUE provoked dvt from pacer wires and acute illness. RUE DVT provoked in setting of line. She has now completed 3 months therapeutic anticoagulation. Reviewed newest ultrasound reports from 01/10/19- no evidence of dvt but some minor chronic changes bilaterally. I am inclined to use prophylactic dosage anticoagulation going forward as opposed to stopping a/c completed as the ICD lead wire may very well be at risk /nidus for further recurrent clot formation-- though alternately, the lead would likely be endothelialized and perhaps pose less of a risk than prior.   -Decrease Eliquis 2.5mg  BID  -Repeat D dimer in 2-3 mo    2. Iron defn anemia: In setting of GI bleed. Persistent despite iron rich diet. We discussed both oral and IV iron. She will elect for Vitron C every other day for now. We will plan to repeat the iron studies in 2-3 mo.     Pt instructed to send non urgent messages via MyChart and provided with [facility] clinic phone number. RTC 3 mo.

## 2019-01-16 ENCOUNTER — Telehealth: Payer: Self-pay | Admitting: Hematology

## 2019-01-16 ENCOUNTER — Encounter: Payer: Self-pay | Admitting: Hematology

## 2019-01-16 NOTE — Telephone Encounter (Signed)
Pt is scheduled for 04/01/2019 at 11 am and pt is aware.     RTC in 3 months via telehealth thanks!

## 2019-01-16 NOTE — Telephone Encounter (Signed)
Labs ordered, plz give [facility] lab appt 2.5 mo     rtc telehealth 3 mo

## 2019-01-16 NOTE — Telephone Encounter (Signed)
Called patient and left detailed message scheduling video visit f/u on 04/06/2018 at 2:40 pm.

## 2019-03-03 ENCOUNTER — Ambulatory Visit (INDEPENDENT_AMBULATORY_CARE_PROVIDER_SITE_OTHER): Payer: Self-pay | Admitting: Nurse Practitioner

## 2019-04-01 ENCOUNTER — Other Ambulatory Visit: Payer: TRICARE For Life (TFL)

## 2019-04-03 ENCOUNTER — Encounter: Payer: Self-pay | Admitting: Hematology

## 2019-04-03 ENCOUNTER — Other Ambulatory Visit: Payer: Self-pay | Admitting: Cardiovascular Disease

## 2019-04-03 DIAGNOSIS — I6523 Occlusion and stenosis of bilateral carotid arteries: Secondary | ICD-10-CM

## 2019-04-04 ENCOUNTER — Telehealth: Payer: Self-pay | Admitting: Hematology

## 2019-04-04 NOTE — Telephone Encounter (Signed)
Patient stated she has an apt with Dr. Zollie Pee on Monday at 2:40 pm as a virtual visit. She has not had her labs drawn yet. Her son was tested + for COVID and she also went to get tested which was negative. The 14 day quaratine mark will be on Wednesday Jan 27 th.     She would like to know if we reschedule or okay for her to have labs drawn maybe today or tomorrow?

## 2019-04-04 NOTE — Telephone Encounter (Signed)
I spoke with the patient and replayed the message below. Pt confirmed and will continue with virtual visit.

## 2019-04-04 NOTE — Telephone Encounter (Signed)
Per Dr. Zollie Pee, pt's labs can wait but would like to still proceed with visit on Monday. Thank you!

## 2019-04-07 ENCOUNTER — Telehealth: Payer: Self-pay | Admitting: Hematology

## 2019-04-07 ENCOUNTER — Encounter: Payer: Self-pay | Admitting: Hematology

## 2019-04-07 ENCOUNTER — Ambulatory Visit: Payer: Medicare Other | Attending: Hematology | Admitting: Hematology

## 2019-04-07 VITALS — BP 109/67 | HR 76 | Wt 126.0 lb

## 2019-04-07 DIAGNOSIS — D508 Other iron deficiency anemias: Secondary | ICD-10-CM

## 2019-04-07 DIAGNOSIS — I82622 Acute embolism and thrombosis of deep veins of left upper extremity: Secondary | ICD-10-CM | POA: Insufficient documentation

## 2019-04-07 NOTE — Telephone Encounter (Signed)
Labs ordered for 3 months out.     Front desk team:   Please schedule [facility] lab appt PRIOR to her follow up appt.    Please schedule RTC in 3 months via tele health, thank you!

## 2019-04-07 NOTE — Telephone Encounter (Signed)
Labs ordered already, she will get them in the next couple weeks when able    rtc 3 mo telehealth with cbc, iron studies, ferritin, d dimer before hadn (rania plz order and set up that appt)

## 2019-04-07 NOTE — Progress Notes (Signed)
VIDEO VISIT - [facility]      CONSENT: Verbal consent has been obtained from the patient to conduct a video visit encounter to minimize exposure to COVID-19: yes    Total time spent prior to the visit reviewing records and prior labs: 2 minutes  Total time spent face to face with patient: 10 minutes.  Total time spent after face to face for coordination of care and documentation: 5 minutes   Total time spent on encounter: 17 minutes  Telehealth Location: [facility], off-site  Regarding: rpv LUE provoked DVT , RUE DVT       HPI/HEMATOLOGY HISTORY: Tara Perry was seen today via video visit in hematology follow up.  She is a 74 y.o. female with history of DM, NICM, CKD, UE VTE events.    Patient was admitted 6/5-6/16/20 for VF cardiac arrest- underwent hypothermic protocol. s/p BiV ICD placement 08/26/18. She presented to [facility] 08/30/18 with LUE swelling. She was found to have acute subclavian DVT. She was started on Lovenox therapeutic. Her course was complicated by hematemesis on 09/01/18. EGD showed large duodenal ulcer with visible vessels s/p cauterization, clipping. Also noted to have RUE extensive R basilic occlusive thrombus and phlebitis 09/06/18. She resumed anticoagulation and was discharged on Lovenox 1mg /kg BID after stability of hemoglobin on a/c.     Switched to Eliquis 5mg  BID from Lovenox in September.     INTERVAL HISTORY:     Tolerating Eliquis 2.5mg  BID. Vitron C every other day.  Had an accident getting out of tub few weeks ago and hit her R side with large rib bruising likely. Pain much improved now.  Son with COVID. Doing fine.     Increased coreg.     MEDICATIONS/ALLERGIES:     Outpatient Medications Marked as Taking for the 04/07/19 encounter (Telemedicine Visit) with Russ Halo, MD   Medication Sig Dispense Refill    apixaban (ELIQUIS) 5 MG Take 1 tablet (5 mg total) by mouth every 12 (twelve) hours (Patient taking differently: Take 2.5 mg by mouth every 12 (twelve) hours   ) 60  tablet 11    atorvastatin (LIPITOR) 40 MG tablet 80 mg Per patient she is on 80 mg.        carvedilol (Coreg) 12.5 MG tablet       carvedilol (COREG) 6.25 MG tablet 2 (two) times daily with meals         Entresto 49-51 MG Tab per tablet 2 (two) times daily         Jardiance 25 MG tablet 25 mg every morning         metFORMIN (GLUCOPHAGE) 500 MG tablet Take 1 tablet (500 mg total) by mouth 2 (two) times daily Morning and night (Patient taking differently: Take 500 mg by mouth 2 (two) times daily with meals   )      Multiple Vitamins-Minerals (CENTRUM PO) Take by mouth.        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 She received 3 shocks, 1 round of epi with successful ROSC. 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/2018    per patient, he only sees peripheral view    Diabetes mellitus 1992    Gout spondylitis     Gout synovitis     LBBB (left bundle branch block)     LBBB chronic    NICM (nonischemic cardiomyopathy) 08/2018    NICM, EF 20%    Respiratory failure 08/2018    Hypoxic/anoxic respiratory failure, extubated 08/20/2018    Type 2 diabetes mellitus, controlled     Viral cardiomyopathy 2008        Past Surgical History:   Procedure Laterality Date    COLONOSCOPY  2002    normal    EGD N/A 09/01/2018    Procedure: EGD;  Surgeon: Lilia Pro, MD;  Location: [facility];  Service: Gastroenterology;  Laterality: N/A;    EXPLORATORY LAPAROTOMY      ICD IMPLANT BIV N/A 08/26/2018    Procedure: ICD IMPLANT BIV;  Surgeon: Len Childs, MD;  Location: [facility];  Service: Cardiovascular;  Laterality: N/A;  medtronic    LHC W/  CORONARY ANGIOS AND LV Left 08/21/2018    Procedure: LHC W/ CORONARY ANGIOS AND LV;  Surgeon: Burna Forts, MD;  Location: [facility];  Service: Cardiovascular;  Laterality: Left;       Family History   Problem Relation Age of Onset    Cancer Father 50        bladder     Social History:   5 grandkids (54mo-19y/o)  Non smoker   Walks routinely     Patient Active Problem List   Diagnosis    Peritonitis    Type II diabetes mellitus without mention of complication    Gout synovitis    Type 2 diabetes mellitus without complications    Viral cardiomyopathy    CHF (congestive heart failure)    Cardiac arrest    Atrophic vaginitis    Chronic vulvitis    Arm DVT (deep venous thromboembolism), acute, left    Iron deficiency anemia    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   Answers for HPI/ROS submitted by the patient on 04/07/2019   Symptoms  Unexpected weight loss: No  Unexpected weight gain: No  fever: No  fatigue: No  Night sweats: No  Hot flashes: No  rash: No  itching: No  blurred vision: No  hearing loss: No  Ringing in ears: No  Bleeding gums: No  Sinus pain: No  sore throat: No  Voice change: No  chest pain: No  palpitations: No  difficulty breathing: No  Pain with breathing: No  Chronic cough: No  Bloody sputum: No  wheezing: No  Snoring: No  Appetite change: No  nausea: No  vomiting: No  diarrhea: No  Rectal bleeding: No  Bloody stool: No  constipation: No  heartburn: No  Vaginal dryness: No  Urinary frequency: No  Blood in urine: No  bladder incontinence: No  Change in urinary stream: No  Difficulty walking: No  headaches: No  numbness: No  tingling: No  dizziness: No  seizures: No  tremors: No  Memory Loss: No  confusion: No  mood changes: No  Depressed mood: No  Difficulty sleeping: No  nervous/anxious: No  Bruise or bleeds easily: No  Have you ever used birth control pills? If Yes,  please  indicate with the number of years:: Yes          PHYSICAL EXAMINATION  Vital Signs: BP 109/67    Pulse 76    Wt 57.2 kg (126 lb)    BMI 20.97 kg/m      The following limited physical exam components were observed via video:    General Appearance:  Alert, cooperative, no distress, appears stated age   Head: Normocephalic, without obvious abnormality, atraumatic   Eyes:  Sclera anicteric, conjunctiva without pallor   Lungs:    Abdomen:   Breathing comfortably without accessory muscle use. Normal respiratory effort.   Non-distended   Musculoskeletal: No apparent gait or station abnormalities    Skin: Normal skin color, texture, no visible rashes or lesions   Extremities: No apparent edema   Neurologic:  Mental Status: Alert and oriented x3, face symmetric, tongue midline.  Normal mood and affect, cooperative, judgement intact     LABS/RADIOLOGY:     No visits with results within 1 Day(s) from this visit.   Latest known visit with results is:   Appointment on 01/14/2019   Component Date Value Ref Range Status    D-Dimer 01/14/2019 0.37  0.00 - 0.70 ug/mL FEU Final    Ferritin 01/14/2019 13.68  4.60 - 204.00 ng/mL Final    Iron 01/14/2019 78  40 - 145 ug/dL Final    UIBC 09/81/1914 305  126 - 382 ug/dL Final    TIBC 78/29/5621 383  265 - 497 ug/dL Final    Iron Saturation 01/14/2019 20  15 - 50 % Final    WBC 01/14/2019 9.47  3.10 - 9.50 x10 3/uL Final    Hgb 01/14/2019 10.7* 11.4 - 14.8 g/dL Final    Hematocrit 30/86/5784 33.3* 34.7 - 43.7 % Final    Platelets 01/14/2019 259  142 - 346 x10 3/uL Final    RBC 01/14/2019 3.76* 3.90 - 5.10 x10 6/uL Final    MCV 01/14/2019 88.6  78.0 - 96.0 fL Final    MCH 01/14/2019 28.5  25.1 - 33.5 pg Final    MCHC 01/14/2019 32.1  31.5 - 35.8 g/dL Final    RDW 69/62/9528 13  11 - 15 % Final    MPV 01/14/2019 10.5  8.9 - 12.5 fL Final    Neutrophils 01/14/2019 63.0  None % Final    Lymphocytes Automated 01/14/2019 23.7  None % Final    Monocytes 01/14/2019 10.0  None %  Final    Eosinophils Automated 01/14/2019 2.4  None % Final    Basophils Automated 01/14/2019 0.6  None % Final    Immature Granulocytes 01/14/2019 0.3  None % Final    Nucleated RBC 01/14/2019 0.0  0.0 - 0.0 /100 WBC Final    Neutrophils Absolute 01/14/2019 5.96  1.10 - 6.33 x10 3/uL Final    Lymphocytes Absolute Automated 01/14/2019 2.24  0.42 - 3.22 x10 3/uL Final    Monocytes Absolute Automated 01/14/2019 0.95* 0.21 - 0.85 x10 3/uL Final    Eosinophils Absolute Automated 01/14/2019 0.23  0.00 - 0.44 x10 3/uL Final    Basophils Absolute Automated 01/14/2019 0.06  0.00 - 0.08 x10 3/uL Final    Immature Granulocytes Absolute 01/14/2019 0.03  0.00 - 0.07 x10 3/uL Final    Absolute NRBC 01/14/2019 0.00  0.00 - 0.00 x10 3/uL Final    Glucose 01/14/2019 231* 70 - 100 mg/dL Final    BUN 41/32/4401 29.0* 7.0 - 19.0 mg/dL Final  Creatinine 01/14/2019 1.6* 0.4 - 1.5 mg/dL Final    Sodium 91/47/8295 137  136 - 145 mEq/L Final    Potassium 01/14/2019 4.4  3.5 - 5.1 mEq/L Final    Chloride 01/14/2019 106  100 - 111 mEq/L Final    CO2 01/14/2019 19* 21 - 29 mEq/L Final    Calcium 01/14/2019 9.3  7.9 - 10.2 mg/dL Final    Protein, Total 01/14/2019 6.6  6.0 - 8.3 g/dL Final    Albumin 62/13/0865 4.2  3.5 - 5.0 g/dL Final    AST (SGOT) 78/46/9629 15  5 - 34 U/L Final    ALT 01/14/2019 12  0 - 55 U/L Final    Alkaline Phosphatase 01/14/2019 96  37 - 106 U/L Final    Bilirubin, Total 01/14/2019 0.3  0.2 - 1.2 mg/dL Final    Globulin 52/84/1324 2.4  2.0 - 3.7 g/dL Final    Albumin/Globulin Ratio 01/14/2019 1.8  0.9 - 2.2 Final    Anion Gap 01/14/2019 12.0  5.0 - 15.0 Final    Hemolysis Index 01/14/2019 5  0 - 18 Final    EGFR 01/14/2019 31.5   Final     LUE Doppler U/S 09/01/18: IMPRESSION:   Extensive occlusive and nonocclusive left upper extremity DVT in the  region of left pacer wire, as detailed. Per discussion with clinical  team at the time of dictation, there are aware of this finding.    RUE  Doppler U/S 09/07/18: IMPRESSION:    Extensive right basilic occlusive thrombus in associated  thrombophlebitis. Continuous flow right cephalic vein. Other findings as  above.    IMPRESSION/RECOMMENDATIONS: Tara Perry is a 74 y.o. female with   1. Arm DVT (deep venous thromboembolism), acute, left    2. Other iron deficiency anemia      1. Provoked DVT: LUE provoked dvt from pacer wires and acute illness. RUE DVT provoked in setting of line. She has now completed 3 months therapeutic anticoagulation. Reviewed newest ultrasound reports from 01/10/19- no evidence of dvt but some minor chronic changes bilaterally. I am inclined to use prophylactic dosage anticoagulation going forward as opposed to stopping a/c completed as the ICD lead wire may very well be at risk /nidus for further recurrent clot formation-- though alternately, the lead would likely be endothelialized and perhaps pose less of a risk than prior.   -Cont Eliquis 2.5mg  BID  -Repeat D dimer now    2. Iron defn anemia: In setting of GI bleed. Vitron C every other day for now. We will plan to repeat the iron studies.    Pt instructed to send non urgent messages via MyChart and provided with [facility] clinic phone number. RTC 3 mo.    Thank you for involving me in this patient's care.      Russ Halo, MD  Benign Hematology  [facility]  [address]  [place], [place] [address]  Phone: [phone]  Fax: [phone]

## 2019-04-08 NOTE — Telephone Encounter (Signed)
Lab apt has been scheduled for 04/26 at 10:30 am -orders linked. Virtual visit has been scheduled for 05/03 at 1:40 pm. Patient has been notified via mychart.

## 2019-04-14 ENCOUNTER — Ambulatory Visit: Payer: Medicare Other

## 2019-05-07 ENCOUNTER — Ambulatory Visit (INDEPENDENT_AMBULATORY_CARE_PROVIDER_SITE_OTHER): Payer: Self-pay

## 2019-05-07 ENCOUNTER — Encounter: Payer: Self-pay | Admitting: Hematology

## 2019-05-07 ENCOUNTER — Ambulatory Visit
Admission: RE | Admit: 2019-05-07 | Discharge: 2019-05-07 | Disposition: A | Discharge status: Home / Self Care | Payer: Medicare Other | Source: Ambulatory Visit | Attending: Cardiovascular Disease | Admitting: Cardiovascular Disease

## 2019-05-07 DIAGNOSIS — I6523 Occlusion and stenosis of bilateral carotid arteries: Secondary | ICD-10-CM | POA: Insufficient documentation

## 2019-05-19 ENCOUNTER — Emergency Department
Admission: EM | Admit: 2019-05-19 | Discharge: 2019-05-19 | Disposition: A | Discharge status: Home / Self Care | Payer: Medicare Other | Attending: Emergency Medicine | Admitting: Emergency Medicine

## 2019-05-19 ENCOUNTER — Emergency Department: Payer: Medicare Other

## 2019-05-19 DIAGNOSIS — Z09 Encounter for follow-up examination after completed treatment for conditions other than malignant neoplasm: Secondary | ICD-10-CM | POA: Insufficient documentation

## 2019-05-19 DIAGNOSIS — I509 Heart failure, unspecified: Secondary | ICD-10-CM | POA: Insufficient documentation

## 2019-05-19 DIAGNOSIS — R531 Weakness: Secondary | ICD-10-CM

## 2019-05-19 DIAGNOSIS — E1122 Type 2 diabetes mellitus with diabetic chronic kidney disease: Secondary | ICD-10-CM | POA: Insufficient documentation

## 2019-05-19 DIAGNOSIS — R200 Anesthesia of skin: Secondary | ICD-10-CM | POA: Insufficient documentation

## 2019-05-19 DIAGNOSIS — Z86718 Personal history of other venous thrombosis and embolism: Secondary | ICD-10-CM | POA: Insufficient documentation

## 2019-05-19 DIAGNOSIS — Z8673 Personal history of transient ischemic attack (TIA), and cerebral infarction without residual deficits: Secondary | ICD-10-CM | POA: Insufficient documentation

## 2019-05-19 DIAGNOSIS — N183 Chronic kidney disease, stage 3 unspecified: Secondary | ICD-10-CM | POA: Insufficient documentation

## 2019-05-19 LAB — ECG 12-LEAD
Atrial Rate: 61 {beats}/min
Atrial Rate: 64 {beats}/min
P Axis: 71 degrees
P Axis: 83 degrees
P-R Interval: 184 ms
P-R Interval: 188 ms
Q-T Interval: 432 ms
Q-T Interval: 426 ms
QRS Duration: 96 ms
QRS Duration: 94 ms
QTC Calculation (Bezet): 434 ms
QTC Calculation (Bezet): 439 ms
R Axis: 37 degrees
R Axis: 3 degrees
T Axis: 57 degrees
T Axis: 23 degrees
Ventricular Rate: 61 {beats}/min
Ventricular Rate: 64 {beats}/min

## 2019-05-19 LAB — COMPREHENSIVE METABOLIC PANEL
ALT: 14 U/L (ref 0–55)
AST (SGOT): 21 U/L (ref 5–34)
Albumin/Globulin Ratio: 1.8 (ref 0.9–2.2)
Albumin: 4.6 g/dL (ref 3.5–5.0)
Alkaline Phosphatase: 93 U/L (ref 37–106)
Anion Gap: 13 (ref 5.0–15.0)
BUN: 30 mg/dL — ABNORMAL HIGH (ref 7.0–19.0)
Bilirubin, Total: 0.4 mg/dL (ref 0.2–1.2)
CO2: 18 mEq/L — ABNORMAL LOW (ref 22–29)
Calcium: 9.7 mg/dL (ref 7.9–10.2)
Chloride: 105 mEq/L (ref 100–111)
Creatinine: 1.6 mg/dL — ABNORMAL HIGH (ref 0.6–1.0)
Globulin: 2.5 g/dL (ref 2.0–3.6)
Glucose: 152 mg/dL — ABNORMAL HIGH (ref 70–100)
Potassium: 4.9 mEq/L (ref 3.5–5.1)
Protein, Total: 7.1 g/dL (ref 6.0–8.3)
Sodium: 136 mEq/L (ref 136–145)

## 2019-05-19 LAB — URINALYSIS REFLEX TO MICROSCOPIC EXAM - REFLEX TO CULTURE
Bilirubin, UA: NEGATIVE
Blood, UA: NEGATIVE
Glucose, UA: >=500 — AB
Ketones UA: NEGATIVE
Nitrite, UA: NEGATIVE
Protein, UR: NEGATIVE
Specific Gravity UA: 1.014 (ref 1.001–1.035)
Urine pH: 6 (ref 5.0–8.0)
Urobilinogen, UA: NORMAL mg/dL (ref 0.2–2.0)

## 2019-05-19 LAB — CBC AND DIFFERENTIAL
Absolute NRBC: 0 10*3/uL (ref 0.00–0.00)
Basophils Absolute Automated: 0.04 10*3/uL (ref 0.00–0.08)
Basophils Automated: 0.3 %
Eosinophils Absolute Automated: 0.14 10*3/uL (ref 0.00–0.44)
Eosinophils Automated: 1.2 %
Hematocrit: 38.6 % (ref 34.7–43.7)
Hgb: 12.8 g/dL (ref 11.4–14.8)
Immature Granulocytes Absolute: 0.05 10*3/uL (ref 0.00–0.07)
Immature Granulocytes: 0.4 %
Lymphocytes Absolute Automated: 2.15 10*3/uL (ref 0.42–3.22)
Lymphocytes Automated: 18.8 %
MCH: 29.4 pg (ref 25.1–33.5)
MCHC: 33.2 g/dL (ref 31.5–35.8)
MCV: 88.7 fL (ref 78.0–96.0)
MPV: 9.7 fL (ref 8.9–12.5)
Monocytes Absolute Automated: 0.87 10*3/uL — ABNORMAL HIGH (ref 0.21–0.85)
Monocytes: 7.6 %
Neutrophils Absolute: 8.2 10*3/uL — ABNORMAL HIGH (ref 1.10–6.33)
Neutrophils: 71.7 %
Nucleated RBC: 0 /100 WBC (ref 0.0–0.0)
Platelets: 223 10*3/uL (ref 142–346)
RBC: 4.35 10*6/uL (ref 3.90–5.10)
RDW: 12 % (ref 11–15)
WBC: 11.45 10*3/uL — ABNORMAL HIGH (ref 3.10–9.50)

## 2019-05-19 LAB — PT AND APTT
PT INR: 1.1 (ref 0.9–1.1)
PT: 13.7 s (ref 12.6–15.0)
PTT: 30 s (ref 23–37)

## 2019-05-19 LAB — TROPONIN I: Troponin I: 0.01 ng/mL (ref 0.00–0.05)

## 2019-05-19 LAB — GFR: EGFR: 31.5

## 2019-05-19 NOTE — Discharge Instructions (Signed)
Dear Tara Perry:    Thank you for choosing the [facility] Emergency Department, the premier emergency department in the [place] area.  I hope your visit today was EXCELLENT. You will receive a survey via text message that will give you the opportunity to provide feedback to your team about your visit. Please do not hesitate to reach out with any questions!    Specific instructions for your visit today:    It was a pleasure treating you.  If you develop severe headache, confusion, tingling, numbness, weakness, change in vision, chest pain, fevers or ANY other concerning symptoms, please seek medical attention right away.     Paresthesias    You have been seen for paresthesias.    Paresthesia is an abnormal sensation (feeling) in any part of the body. The paresthesia itself has no long-term bad effects. People often describe it as tingling, numbness, burning, or pricking of the skin. Many say it feels like pins and needles, or like the body part is asleep.    Paresthesias can be a symptom of some illnesses. This means there are many things that can cause paresthesias. The paresthesias can be a sign of an underlying medical condition.     Some causes of paresthesias are:   Skin Problems: Irritation of skin by certain chemicals. Swelling of the skin from an injury. A burn or frostbite can feel like numbness.   Pressure on a nerve. This can happen when your arm falls asleep from lying on it too long. Carpal tunnel syndrome can do the same thing.   Hyperventilation (rapid or deep breathing).   Deficiency in some vitamins and minerals. This includes vitamins B1, B5, and B12.   Electrolyte problems.   Diabetic neuropathy (nerve disorders) from long-standing diabetes.   Problems with circulation.   Strokes.    You may have had some testing to help find out the cause of your paresthesias.    We still do not know the cause of your paresthesias. However, it is OK for you to go home. You may need more  tests to figure out the cause.    See your primary care doctor or the referral specialist for more work-up and management of your paresthesias.    YOU SHOULD SEEK MEDICAL ATTENTION IMMEDIATELY, EITHER HERE OR AT THE NEAREST EMERGENCY DEPARTMENT, IF ANY OF THE FOLLOWING OCCUR:   Your arms get weak, numb or paralyzed (can't move), especially on one side.   You have vision loss, trouble speaking or problems thinking.   Your speech is abnormal or one side of your face droops.   You lose consciousness (pass out) or almost lose consciousness.   You have numbness or tingling after a head, neck or back injury.   You feel very dizzy or like the room is spinning.   You have other concerns.          CNS Symptoms NOS (Obs)    You have symptoms that seem like they involve your nervous system.    Symptoms that involve the nervous system may or may not happen along with a headache. Symptoms may include:   Numbness: This is when a part of your body feels like it is asleep. This feeling might be like pins and needles or tingling. Some say it feels like the arm or leg is asleep.   Weakness: This is different from feeling tired or fatigued. It may feel like your arm or leg or one half of your face is not  working right. It may feel like your leg cant support your weight. It might feel like your hand is weak. You might have trouble picking up or holding onto things. One side of your face might droop. You may not be able to raise the corners of your mouth to smile.   Coordination or balance problems: This is called ataxia. It looks like how a very intoxicated (drunk) person might move. There may also be problems with balance or falling to one side.   Confusion: It may suddenly seem your thoughts are not clear. You may suddenly have problems with your short-term memory.   Speech problems: This is called expressive aphasia. You may have trouble getting words out. You know what you want to say. However, you cant  make the words come out. You may have other speech problems. This includes slurring your words. Slurring can sound like how an intoxicated (drunk) person talks.    The list of problems described above is not complete. There are many other symptoms that can seem like they are from a nervous system problem.     The doctor made many complex choices about how to figure out your problem. The doctor may decide to do special tests. He or she may want to talk about your case with another doctor. This will depend on many different things. This includes the type of symptoms you have. You may need to see a specialist. This could be with a neurologist (brain and nervous system specialist), for example. You may have had special tests like a CT scan or MRI. You may have had an ultrasound of your neck or heart arteries. You may have had a spinal tap. If you had these tests or saw a specialist, the doctor will talk to you about this.    You had an extended stay in an observation unit. Medical problems can be very complex. This can be especially true if they involve the brain. It can be very hard to figure some conditions out. It can be hard even after many tests. Even after an observation stay, re-exams and repeat testing, you may still have a serious medical problem.     After your stay, the doctor thinks it is OK for you to go home. You will get more instructions about the problem you came in for.    See your regular or referral doctor. The medical staff here will give you instructions about this. You may have scheduled tests. These could be for an ultrasound, MRI, etc. If so, you need to have the tests done.    Come to the Emergency Department again if your symptoms get worse or you think you still have a serious medical problem.    YOU SHOULD SEEK MEDICAL ATTENTION IMMEDIATELY, EITHER HERE OR AT THE NEAREST EMERGENCY DEPARTMENT, IF ANY OF THE FOLLOWING OCCURS:   Numbness (loss of feeling) or tingling (pins and needles  feeling) in your arm, leg or face.   Any part is weak or has paralysis (cannot move).   Coordination or balance problems.   Bad dizziness (spinning sensation).   You have trouble speaking or can't express yourself all of a sudden.   Trouble swallowing.   Change in how alert you generally are.   Severe headache.           IF YOU DO NOT CONTINUE TO IMPROVE OR YOUR CONDITION WORSENS, PLEASE CONTACT YOUR DOCTOR OR RETURN IMMEDIATELY TO THE EMERGENCY DEPARTMENT.    Sincerely,  Sofie Hartigan  S, MD  Attending Emergency Physician  [facility] Emergency Department    ONSITE PHARMACY  Our full service onsite pharmacy is located in the ER waiting room.  Open 7 days a week from 9 am to 9 pm.  We accept all major insurances and prices are competitive with major retailers.  Ask your provider to print your prescriptions down to the pharmacy to speed you on your way home.    OBTAINING A PRIMARY CARE APPOINTMENT    Primary care physicians (PCPs, also known as primary care doctors) are either internists or family medicine doctors. Both types of PCPs focus on health promotion, disease prevention, patient education and counseling, and treatment of acute and chronic medical conditions.    Call for an appointment with a primary care doctor.  Ask to see who is taking new patients.     [facility]  telephone:  [phone]  [email or web address]    DOCTOR REFERRALS  Call [phone] (available 24 hours a day, 7 days a week) if you need any further referrals and we can help you find a primary care doctor or specialist.  Also, available online at:  [email or web address]    YOUR CONTACT INFORMATION  Before leaving please check with registration to make sure we have an up-to-date contact number.  You can call registration at [phone] to update your information.  For questions about your hospital bill, please call [phone].  For questions about your Emergency Dept Physician bill please call  [phone].      FREE HEALTH SERVICES  If you need help with health or social services, please call 2-1-1 for a free referral to resources in your area.  2-1-1 is a free service connecting people with information on health insurance, free clinics, pregnancy, mental health, dental care, food assistance, housing, and substance abuse counseling.  Also, available online at:  http://www.[facility].org    MEDICAL RECORDS AND TESTS  Certain laboratory test results do not come back the same day, for example urine cultures.   We will contact you if other important findings are noted.  Radiology films are often reviewed again to ensure accuracy.  If there is any discrepancy, we will notify you.      Please call [phone] to pick up a complimentary CD of any radiology studies performed.  If you or your doctor would like to request a copy of your medical records, please call [phone].      ORTHOPEDIC INJURY   Please know that significant injuries can exist even when an initial x-ray is read as normal or negative.  This can occur because some fractures (broken bones) are not initially visible on x-rays.  For this reason, close outpatient follow-up with your primary care doctor or bone specialist (orthopedist) is required.    MEDICATIONS AND FOLLOWUP  Please be aware that some prescription medications can cause drowsiness.  Use caution when driving or operating machinery.    The examination and treatment you have received in our Emergency Department is provided on an emergency basis, and is not intended to be a substitute for your primary care physician.  It is important that your doctor checks you again and that you report any new or remaining problems at that time.      24 HOUR PHARMACIES  The nearest 24 hour pharmacy is:    [facility]  [address]  [place], [place] [address]  [phone]      ASSISTANCE WITH INSURANCE    Affordable Care  Act  (ACA)  Call to start or finish an application,  compare plans, enroll or ask a question.  [phone]  TTY: [phone]  Web:  Healthcare.gov    Help Enrolling in [facility]  [employer] [place]  ([phone] (TOLL-FREE)  938-284-8498 (TTY)  Web:  Http://www.coverva.org    Local Help Enrolling in the [facility] [place] [facility]  [place] [facility]  9150394306 (MAIN)  Email:  health-help@nvfs .org  Web:  BlackjackMyths.is  Address:  [address], [address] [place], [place] [address]    SEDATING MEDICATIONS  Sedating medications include strong pain medications (e.g. narcotics), muscle relaxers, benzodiazepines (used for anxiety and as muscle relaxers), Benadryl/diphenhydramine and other antihistamines for allergic reactions/itching, and other medications.  If you are unsure if you have received a sedating medication, please ask your physician or nurse.  If you received a sedating medication: DO NOT drive a car. DO NOT operate machinery. DO NOT perform jobs where you need to be alert.  DO NOT drink alcoholic beverages while taking this medicine.     If you get dizzy, sit or lie down at the first signs. Be careful going up and down stairs.  Be extra careful to prevent falls.     Never give this medicine to others.     Keep this medicine out of reach of children.     Do not take or save old medicines. Throw them away when outdated.     Keep all medicines in a cool, dry place. DO NOT keep them in your bathroom medicine cabinet or in a cabinet above the stove.    MEDICATION REFILLS  Please be aware that we cannot refill any prescriptions through the ER. If you need further treatment from what is provided at your ER visit, please follow up with your primary care doctor or your pain management specialist.    FREESTANDING EMERGENCY DEPARTMENTS OF [facility]  Did you know [facility] has two freestanding ERs located just a few miles away?  [facility] [place] and [facility] [place] have short wait times, easy free parking directly in front of the  building and top patient satisfaction scores - and the same Board Certified Emergency Medicine doctors as [facility].

## 2019-05-19 NOTE — ED Provider Notes (Signed)
[facility] EMERGENCY DEPARTMENT H&P      Visit date: 05/19/2019      CLINICAL SUMMARY          Diagnosis:    .     Final diagnoses:   Left facial numbness         MDM Notes:      74 y.o. female PMH: HTN, HCHL, DM, CHF, L arm DVT, (Eliquis)  cardiac arrest (subsequent AICD placed) who presents with numbness of L lower face, that started this morning, approx 9:30am and lasted for one hour.  Pt has no current symptoms.  Discussed and offered admission as she has sig. Co-morbities, however, pt declined.      Head CT:   IMPRESSION  :No acute intracranial hemorrhage or mass effect is  identified.    CXR:   No acute cardiopulmonary process.    DDx: CVA, arrhythmia, UTI, dehydration, electrolyte derangement  Plan: labs, EKG, u/a, head CT, re-assess    Pt does not want to wait for urine results.  States she will look at my chart and has an appt tomorrow w/ her cardiologist who can look up the results as well.          Disposition:         Discharge           Discharge Prescriptions     None                         CLINICAL INFORMATION        HPI:      Chief Complaint: Numbness  .    Tara Perry is a 74 y.o. female PMH: HTN, HCHL, DM, CHF, L arm DVT, (Eliquis)  cardiac arrest (subsequent AICD placed) who presents with numbness of L lower face, that started this morning, approx 9:30am and lasted for one hour. Numbness rated as a 3/10.  Same thing happened last year and lasted only for 20 minutes.  No HA, change in vision, no N/V.  No numbness, tingling or weakness to any other location of her body.  No associated dizziness.   Pt has an appt w/ cardiologist tomorrow.   No fevers or coughing, no urinary symptoms, no bleeding issues.     Context: home  Location: home  Duration: one hour  Quality: numbness  Timing: persistent  Maximum Severity: mild  Modifying Factors: none    History obtained from: Patient          ROS:      Positive and negative ROS elements as per HPI.  All other systems  reviewed and negative.      Physical Exam:      Pulse 72   BP 159/70   Resp 17   SpO2 98 %   Temp 97.6 F (36.4 C)    Physical Exam  Vitals signs and nursing note reviewed.   Constitutional:       Appearance: She is well-developed.   HENT:      Head: Normocephalic and atraumatic.   Eyes:      Pupils: Pupils are equal, round, and reactive to light.   Neck:      Musculoskeletal: Normal range of motion and neck supple.   Cardiovascular:      Rate and Rhythm: Normal rate and regular rhythm.   Pulmonary:      Effort: Pulmonary effort is normal. No respiratory distress.      Breath sounds: Normal breath sounds.  Chest:       Abdominal:      General: Bowel sounds are normal.      Palpations: Abdomen is soft.   Musculoskeletal: Normal range of motion.         General: No tenderness.   Skin:     General: Skin is warm and dry.   Neurological:      Mental Status: She is alert and oriented to person, place, and time.      Cranial Nerves: No cranial nerve deficit or facial asymmetry.      Sensory: Sensation is intact.      Motor: Motor function is intact. No weakness.      Coordination: Coordination is intact.      Comments: Non-focal neuro exam.  No decreased sensation to face or anywhere else.  5/5 strength throughout.     Psychiatric:         Behavior: Behavior normal.                   PAST HISTORY        Primary Care Provider: Tammi Sou, MD        PMH/PSH:    .     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 She received 3 shocks, 1 round of epi with successful ROSC. 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      Social/Family History:      She reports that she has never smoked. She has never used smokeless tobacco. She reports current alcohol use. She reports that she does not use drugs.    Family History   Problem Relation Age of Onset    Cancer Father 74        bladder         Listed Medications on Arrival:    .     Home Medications     Med List Status: In Progress Set By: Lovey Newcomer, RN at 05/19/2019 11:48 AM                acetaminophen (TYLENOL) 325 MG tablet     Take 2 tablets (650 mg total) by mouth every 6 (six) hours as needed for Pain     apixaban (ELIQUIS) 5 MG     Take 1 tablet (5 mg total) by mouth every 12 (twelve) hours     Patient taking differently: Take 2.5 mg by mouth every 12 (twelve) hours        atorvastatin (LIPITOR) 40 MG tablet     80  mg Per patient she is on 80 mg.       carvedilol (Coreg) 12.5 MG tablet          carvedilol (COREG) 6.25 MG tablet     2 (two) times daily with meals        Entresto 49-51 MG Tab per tablet     2 (two) times daily        glimepiride (AMARYL) 2 MG tablet     Take 2 mg by mouth daily     Jardiance 25 MG tablet     25 mg every morning        metFORMIN (GLUCOPHAGE) 500 MG tablet     Take 1 tablet (500 mg total) by mouth 2 (two) times daily Morning and night     Patient taking differently: Take 500 mg by mouth 2 (two) times daily with meals        Multiple Vitamins-Minerals (CENTRUM PO)     Take by mouth.       sitaGLIPtin (JANUVIA) 100 MG tablet     Take 100 mg by mouth daily. At night      spironolactone (ALDACTONE) 25 MG tablet     Take 1 tablet (25 mg total) by mouth daily     vitamin  B-12 (CYANOCOBALAMIN) 100 MCG tablet     Take 1,000 mcg by mouth daily            Allergies: She is allergic to shellfish-derived products.            VISIT INFORMATION        Clinical Course in the ED:             Medications Given in the ED:    .     ED Medication Orders (From admission, onward)    None            Procedures:      Procedures      Interpretations:      O2 sat-           saturation: 98 %; Oxygen use: room air; Interpretation: Normal     EKG: atrial sensed, V-paced at a rate of 64, nl axis, no ST elevation noted. No sig. Change from prior.               RESULTS        Lab Results:      Results     Procedure Component Value Units Date/Time    UA Reflex to Micro - Reflex to Culture [[id number]]  (Abnormal) Collected: 05/19/19 1622     Updated: 05/19/19 1703     Urine Type Urine, Clean Ca     Color, UA Yellow     Clarity, UA Hazy     Specific Gravity UA 1.014     Urine pH 6.0     Leukocyte Esterase, UA Moderate     Nitrite, UA Negative     Protein, UR Negative     Glucose, UA >=500     Ketones UA Negative     Urobilinogen, UA Normal mg/dL      Bilirubin, UA Negative     Blood, UA Negative     RBC, UA 0 - 2 /hpf      WBC, UA TNTC /hpf      Squamous Epithelial Cells, Urine 0 - 5 /hpf     Narrative:      Replace urinary catheter prior to obtaining the urine culture  if it has been in place for greater than or equal to 14  days:->N/A No Foley  Indications for U/A Reflex to Micro - Reflex to  Culture:->Suprapubic Pain/Tenderness or Dysuria    Troponin I [[id number]] Collected: 05/19/19 1517    Specimen: Blood Updated: 05/19/19 1556     Troponin I 0.01 ng/mL     Narrative:      Replace urinary catheter prior to obtaining the urine culture  if it has been in place for greater than or equal to 14  days:->N/A No Foley  Indications for U/A Reflex to Micro - Reflex to  Culture:->Suprapubic Pain/Tenderness or Dysuria    PT/APTT [[id number]] Collected: 05/19/19 1517     Updated: 05/19/19 1549     PT 13.7 sec      PT  INR 1.1     PTT 30 sec     Narrative:      Replace urinary catheter prior to obtaining the urine culture  if it has been in place for greater than or equal to 14  days:->N/A No Foley  Indications for U/A Reflex to Micro - Reflex to  Culture:->Suprapubic Pain/Tenderness or Dysuria    Comprehensive metabolic panel [[id number]]  (Abnormal) Collected: 05/19/19 1517    Specimen: Blood Updated: 05/19/19 1549     Glucose 152 mg/dL      BUN 08.6 mg/dL      Creatinine 1.6 mg/dL      Sodium 578 mEq/L      Potassium 4.9 mEq/L      Chloride 105 mEq/L      CO2 18 mEq/L      Calcium 9.7 mg/dL      Protein, Total 7.1 g/dL      Albumin 4.6 g/dL      AST (SGOT) 21 U/L      ALT 14 U/L      Alkaline Phosphatase 93 U/L      Bilirubin, Total 0.4 mg/dL      Globulin 2.5 g/dL      Albumin/Globulin Ratio 1.8     Anion Gap 13.0    Narrative:      Replace urinary catheter prior to obtaining the urine culture  if it has been in place for greater than or equal to 14  days:->N/A No Foley  Indications for U/A Reflex to Micro - Reflex to  Culture:->Suprapubic Pain/Tenderness or Dysuria    GFR [[id number]] Collected: 05/19/19 1517     Updated: 05/19/19 1549     EGFR 31.5    Narrative:      Replace urinary catheter prior to obtaining the urine culture  if it has been in place for greater than or equal to 14  days:->N/A No Foley  Indications for U/A Reflex to Micro - Reflex to  Culture:->Suprapubic Pain/Tenderness or Dysuria    CBC and differential [[id number]]  (Abnormal) Collected: 05/19/19 1517    Specimen: Blood Updated: 05/19/19 1538     WBC 11.45 x10 3/uL      Hgb 12.8 g/dL      Hematocrit 27.2 %      Platelets 223 x10 3/uL      RBC 4.35 x10 6/uL      MCV 88.7 fL      MCH 29.4 pg      MCHC 33.2 g/dL      RDW 12 %      MPV 9.7 fL      Neutrophils 71.7 %      Lymphocytes Automated 18.8 %  Monocytes 7.6 %      Eosinophils Automated 1.2 %      Basophils Automated 0.3 %      Immature Granulocytes 0.4 %      Nucleated RBC 0.0 /100 WBC       Neutrophils Absolute 8.20 x10 3/uL      Lymphocytes Absolute Automated 2.15 x10 3/uL      Monocytes Absolute Automated 0.87 x10 3/uL      Eosinophils Absolute Automated 0.14 x10 3/uL      Basophils Absolute Automated 0.04 x10 3/uL      Immature Granulocytes Absolute 0.05 x10 3/uL      Absolute NRBC 0.00 x10 3/uL     Narrative:      Replace urinary catheter prior to obtaining the urine culture  if it has been in place for greater than or equal to 14  days:->N/A No Foley  Indications for U/A Reflex to Micro - Reflex to  Culture:->Suprapubic Pain/Tenderness or Dysuria              Radiology Results:      XR Chest 2 Views   Final Result      1. No acute cardiopulmonary process.      Clide Cliff, MD    05/19/2019 2:22 PM      CT Head WO Contrast   Final Result                  Scribe Attestation:      No scribe involved in the care of this patient          Rudi Rummage, MD  05/21/19 1248

## 2019-05-20 ENCOUNTER — Ambulatory Visit (INDEPENDENT_AMBULATORY_CARE_PROVIDER_SITE_OTHER): Payer: Self-pay | Admitting: Cardiovascular Disease

## 2019-05-24 NOTE — Progress Notes (Addendum)
Addendum: Spoke to patient today and alerted her of culture results. Spoke to her about prescribing Bactrim, to which she applied that "sulfa drugs do not work for her." Given this information and culture and sensitivity results, prescribed levofloxacin 250 mg PO daily x 3 days. Patient is diabetic so counseled on the risk for blood glucose abnormalities with fluoroquinolones as well as tendon rupture risk. RX sent to [facility] on [address] in [place], [place]. All patient questions answered and patient verbalized understanding.     Lab Call Back    Patient p/w facial numbness that had resolved by the time of ED visit. She denied urinary symptoms but had a slight leukocytosis with a WBC 11 and had a +UA with moderate leuk esterases and TNTC WBC. She was not discharged with antibiotics. Her urine culture resulted with >100,000 CFU/mL E coli. Per Dr. Jerene Pitch, attempted to contact patient to prescribe Bactrim SS PO Q12H x 3 days but patient did not answer. Left voicemail to call ED QA physician.     Dorthula Matas, PharmD  PGY-1 Pharmacy Resident   [phone]

## 2019-05-26 MED ORDER — LEVOFLOXACIN 250 MG PO TABS
250.00 mg | ORAL_TABLET | Freq: Every day | ORAL | 0 refills | Status: AC
Start: 2019-05-26 — End: 2019-05-29

## 2019-06-10 ENCOUNTER — Encounter: Payer: Self-pay | Admitting: Hematology

## 2019-06-12 ENCOUNTER — Telehealth: Payer: Self-pay | Admitting: Hematology

## 2019-06-12 ENCOUNTER — Encounter (INDEPENDENT_AMBULATORY_CARE_PROVIDER_SITE_OTHER): Payer: Self-pay

## 2019-06-12 NOTE — Telephone Encounter (Signed)
Patient called to request a refill of Eliquis 5 mg BID.  Per Provider's notes patient's dosage is 2.5 mg BID, also per medication list patient has sufficient refills until August of this year.  Patient advised via my chart message.

## 2019-06-14 ENCOUNTER — Other Ambulatory Visit (INDEPENDENT_AMBULATORY_CARE_PROVIDER_SITE_OTHER): Payer: Self-pay | Admitting: Cardiovascular Disease

## 2019-06-14 DIAGNOSIS — I6523 Occlusion and stenosis of bilateral carotid arteries: Secondary | ICD-10-CM

## 2019-06-16 ENCOUNTER — Ambulatory Visit
Admission: RE | Admit: 2019-06-16 | Discharge: 2019-06-16 | Disposition: A | Discharge status: Home / Self Care | Payer: Medicare Other | Source: Ambulatory Visit | Attending: Cardiovascular Disease | Admitting: Cardiovascular Disease

## 2019-06-16 DIAGNOSIS — I6523 Occlusion and stenosis of bilateral carotid arteries: Secondary | ICD-10-CM | POA: Insufficient documentation

## 2019-06-16 LAB — WHOLE BLOOD CREATININE WITH GFR POCT
GFR POCT: 32 mL/min/{1.73_m2} — ABNORMAL LOW (ref >60)
Whole Blood Creatinine POCT: 1.7 mg/dL — ABNORMAL HIGH (ref 0.5–1.1)

## 2019-06-16 MED ORDER — GADOBUTROL 1 MMOL/ML IV SOLN
10.00 mL | Freq: Once | INTRAVENOUS | Status: AC | PRN
Start: 2019-06-16 — End: 2019-06-16
  Administered 2019-06-16: 10 mmol via INTRAVENOUS
  Filled 2019-06-16: qty 10

## 2019-06-17 ENCOUNTER — Other Ambulatory Visit (INDEPENDENT_AMBULATORY_CARE_PROVIDER_SITE_OTHER): Payer: Self-pay | Admitting: Cardiovascular Disease

## 2019-06-17 DIAGNOSIS — I6523 Occlusion and stenosis of bilateral carotid arteries: Secondary | ICD-10-CM

## 2019-06-18 ENCOUNTER — Ambulatory Visit (INDEPENDENT_AMBULATORY_CARE_PROVIDER_SITE_OTHER): Payer: Medicare Other

## 2019-06-18 ENCOUNTER — Telehealth (INDEPENDENT_AMBULATORY_CARE_PROVIDER_SITE_OTHER): Payer: Medicare Other | Admitting: Neurology

## 2019-06-18 ENCOUNTER — Encounter (INDEPENDENT_AMBULATORY_CARE_PROVIDER_SITE_OTHER): Payer: Self-pay | Admitting: Neurology

## 2019-06-18 VITALS — Ht 64.0 in | Wt 130.0 lb

## 2019-06-18 DIAGNOSIS — H9202 Otalgia, left ear: Secondary | ICD-10-CM | POA: Insufficient documentation

## 2019-06-18 DIAGNOSIS — Z23 Encounter for immunization: Secondary | ICD-10-CM

## 2019-06-18 NOTE — Progress Notes (Signed)
Subjective:      Patient ID: Tara Perry is a 74 y.o. female.  Verbal consent has been obtained from the patient to conduct a video visit to minimize exposure to COVID-19: YES      HPI  This is a 74 year old woman with history of cardiac arrest, diabetes mellitus and right carotid stenosis who has been referred here for symptoms of pain behind the left ear and pain on the left cheek.    The patient states that on March 8 she noticed that she had pain behind her left ear.  Intensity was 7 out of 10.  It seemed to spread to the left cheek.  According to the patient this had happened 2 years ago.  The patient went to the emergency room in March and according to the patient stroke was ruled out.  The symptoms had resolved before she left the emergency room.  The patient states that about a week ago the pain behind the left ear returned.  It lasted for about 2 hours.  She used a hot compress and it helped.  The patient saw her primary care physician last week.  Patient states that she has had an MRA of the neck recently and is scheduled to follow-up with the vascular surgeon.    The following portions of the patient's history were reviewed and updated as appropriate: allergies, current medications, past family history, past medical history, past social history, past surgical history and problem list.    Review of Systems  Reviewed all systems and negative other than what is mentioned above.  Objective:   Neurologic Exam  Vital Signs:  Ht 1.626 m (5\' 4" )    Wt 59 kg (130 lb)    BMI 22.31 kg/m     General: The patient was well developed and well nourished.  No acute distress. Cooperative with the exam.  Extremities:  extremities normal in color    Mental Status: The patient was awake, alert and oriented to person, place, and time.  Affect is normal.  Attention span and concentration appear normal.  Language function is normal. There is no evidence of aphasia in conversational speech.    Cranial nerves:   -CN III, IV,  VI:  extraocular movements intact; no ptosis              -CN V: Facial sensation intact in V1 through V3 distributions   -CN VII: Face symmetric   -CN VIII: Hearing intact to conversational speech   -CN IX, X:  normal phonation   -CN XII: Tongue protrudes midline    Motor:   No pronator drift.  Strength antigravity in bilateral upper extremities.    Sensory:   Light touch intact.    Coordination: FTN intact. No tremors.    Assessment:     See below.    Plan:     Impression and plan:  This is a 74 year old woman with history of cardiac arrest, type 2 diabetes mellitus carotid stenosis on the right was noticed intermittent pain behind the left ear.  According to the patient it is tender to touch.  Differential could include occipital neuralgia, would recommend MRI of the brain with and without contrast rule out a structural cause, could consider neuropathic pain medications like gabapentin or injections for treatment, this will depend on severity and recurrence.    The patient had noticed that the pain behind the left ear spread to the left cheek.  Patient has right carotid stenosis.  Differential could  include TIA.  Patient is on anticoagulation at this time.  Recommend getting MRI of the brain with and without contrast rule out a structural cause.    According to the patient the area is tender to touch behind the ear.  Recommend seeing ENT to rule out etiology for her symptoms.    Discussed the above with the patient in detail.  Patient would like to discuss this with her primary care physician.  Patient is not keen on doing MRI of the brain at this time.    The patient will call for a follow-up visit. The patient will call if there are any questions before that. The patient should seek emergent care if there is any change in the symptoms.     Gwenevere Abbot, MD - [facility]   Board Certified in Neurology by ABPN   Board Certified in neuromuscular medicine by ABPN   Board Certified in Electrodiagnostic  Medicine by ABEM

## 2019-06-27 ENCOUNTER — Encounter (INDEPENDENT_AMBULATORY_CARE_PROVIDER_SITE_OTHER): Payer: Self-pay

## 2019-06-27 NOTE — Progress Notes (Signed)
Appt Date/Time:  06/30/19  @ 2:45 pm    74 y.o. female   PCP: Tammi Sou, MD  Referring Physician:    New Patient   X Follow-Up Post-op   [place] [facility] [place] [facility] Other Imaging  *[facility]- MRA neck 06/16/19  *[place] [facility]- Carotid US 05/07/19     Chief Complaint/HPI: Consult Carotid Stenosis  Date of Last Visit:     Allergies:   Allergies   Allergen Reactions    Shellfish-Derived Products Anaphylaxis          Selected Medications:    Coumadin Xarelto Eliquis Pradaxa Lovenox Other thinner:    Plavix Aspirin Brillinta Effient  Pletal Trental Fish Oil   Insulin Metformin Other Diabetes Atorvastatin Rosuvastatin Other Chol:    MHx:  Stroke / TIA / Seizures HTN / HLD  Diabetes   MI / CAD A-fib / Arrhythmia  COPD / Asthma / (other lung)   Kidney Disease Liver Disease DVT / Coagulopathy   Wounds Spine / other MSK  Cancer            Smoker           Former Smoker    X      Never Smoker    Vital Signs this Visit Pulses  HT  BP R BP L Temp   Rad Ulnar Brach Fem Pop DP PT        R          WT  Pulse R Pulse L SpO2       L          Imaging results:        Plan of Care:

## 2019-06-30 ENCOUNTER — Encounter (INDEPENDENT_AMBULATORY_CARE_PROVIDER_SITE_OTHER): Payer: Self-pay | Admitting: Specialist

## 2019-06-30 ENCOUNTER — Ambulatory Visit (INDEPENDENT_AMBULATORY_CARE_PROVIDER_SITE_OTHER): Payer: Medicare Other | Admitting: Specialist

## 2019-06-30 DIAGNOSIS — I6523 Occlusion and stenosis of bilateral carotid arteries: Secondary | ICD-10-CM

## 2019-06-30 NOTE — Progress Notes (Signed)
[facility]      Chief Complaint   Patient presents with    Consult (Initial)     Consult carotid stenosis          History of Present Illness     Tara Perry is a 74 y.o. female who presents in new vascular surgery consultation for carotid artery stenosis.  She is referred by Dr. Timmothy Euler from [facility].      She reports known asymptomatic carotid stenosis that has been monitored for the last few years until recent worsening on carotid duplex which prompted additional imaging with MRA.  She has known CKD stage III with recent GFR of 31 and therefore CTA was not recommended.      She denies history of lateralizing neurologic ischemia.  She has no prior history of amaurosis fugax, TIA or CVA.    She is seen today in consultation with her son, Tara Perry.      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  She has history of DVT in the left upper extremity after its placement and has been on Eliquis therapy.    Other past medical history includes diabetes mellitus, gout and cardiomyopathy.    Past Medical History     Past Medical History:   Diagnosis Date    Anemia June 2020    Atrophic vaginitis 08/30/2018    Bilateral cataracts     I have implants    Cardiac arrest 08/17/2018    VF arrest. She received 3 shocks, 1 round of epi with successful ROSC. 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    Type 2 diabetes mellitus, controlled     Viral cardiomyopathy 2008        Past Surgical History:   Procedure Laterality Date    COLONOSCOPY  2002    normal    EGD N/A 09/01/2018    Procedure: EGD;  Surgeon: Lilia Pro, MD;  Location: [facility];  Service: Gastroenterology;  Laterality: N/A;    EXPLORATORY LAPAROTOMY      EYE SURGERY      ICD IMPLANT BIV N/A 08/26/2018    Procedure: ICD IMPLANT BIV;  Surgeon: Len Childs, MD;  Location: [facility];  Service: Cardiovascular;  Laterality: N/A;  medtronic    INSERT / REPLACE / REMOVE PACEMAKER      Difibrillator August 26, 2018    LHC W/ CORONARY ANGIOS AND LV Left 08/21/2018    Procedure: LHC W/ CORONARY ANGIOS AND LV;  Surgeon: Burna Forts, MD;  Location: [facility];  Service: Cardiovascular;  Laterality: Left;    SMALL INTESTINE SURGERY  November 2014       Allergies     Allergies   Allergen Reactions    Shellfish-Derived Products Anaphylaxis  Medications       Current Outpatient Medications:     apixaban (ELIQUIS) 5 MG, Take 1 tablet (5 mg total) by mouth every 12 (twelve) hours (Patient taking differently: Take 2.5 mg by mouth every 12 (twelve) hours  ), Disp: 60 tablet, Rfl: 11    atorvastatin (LIPITOR) 40 MG tablet, 80 mg Per patient she is on 80 mg. , Disp: , Rfl:     carvedilol (Coreg) 12.5 MG tablet, Take 12.5 mg by mouth 2 (two) times daily with meals  , Disp: , Rfl:     Entresto 49-51 MG Tab per tablet, 2 (two) times daily  , Disp: , Rfl:     glimepiride (AMARYL) 2 MG tablet, Take 2 mg by mouth daily, Disp: , Rfl:     Iron-Vitamin C (VITRON-C PO), Take by mouth, Disp: , Rfl:     Jardiance 25 MG tablet, 25 mg every morning  , Disp: , Rfl:     metFORMIN (GLUCOPHAGE) 500 MG tablet, Take 1 tablet (500 mg total) by mouth 2 (two) times daily Morning and night (Patient taking differently: Take 500 mg by mouth 2 (two) times daily with meals  ), Disp: , Rfl:     Multiple Vitamins-Minerals (CENTRUM PO), Take by mouth.  , Disp: , Rfl:     vitamin B-12 (CYANOCOBALAMIN) 100 MCG tablet, Take 1,000  mcg by mouth daily  , Disp: , Rfl:     Family History   Problem Relation Age of Onset    Cancer Father 60        bladder    Diabetes Father     Heart attack Father        Social History     Socioeconomic History    Marital status: Widowed     Spouse name: Not on file    Number of children: Not on file    Years of education: Not on file    Highest education level: Not on file   Occupational History    Not on file   Tobacco Use    Smoking status: Never Smoker    Smokeless tobacco: Never Used   Substance and Sexual Activity    Alcohol use: Not Currently     Alcohol/week: 0.0 standard drinks     Comment: social    Drug use: No    Sexual activity: Not Currently   Other Topics Concern    Not on file   Social History Narrative    Not on file     Social Determinants of Health     Financial Resource Strain:     Difficulty of Paying Living Expenses:    Food Insecurity:     Worried About Programme researcher, broadcasting/film/video in the Last Year:     Barista in the Last Year:    Transportation Needs:     Freight forwarder (Medical):     Lack of Transportation (Non-Medical):    Physical Activity:     Days of Exercise per Week:     Minutes of Exercise per Session:    Stress:     Feeling of Stress :    Social Connections:     Frequency of Communication with Friends and Family:     Frequency of Social Gatherings with Friends and Family:     Attends Religious Services:     Active Member of Clubs or Organizations:     Attends Banker Meetings:     Marital  Status:    Intimate Partner Violence:     Fear of Current or Ex-Partner:     Emotionally Abused:     Physically Abused:     Sexually Abused:        Review of Systems   Constitutional: Denies weight changes, fever, chills, weakness or night sweats.   Neurologic:  Denies headaches, dizziness, fainting, numbness, tingling, seizure, stroke or tremors.    HEENT:  Denies visual changes, hearing loss, difficulty swallowing, snoring or  hoarseness.  Respiratory: Denies cough, wheezing, SOB or hemoptysis.  Cardiovascular: See HPI. Denies chest pain, cardiac murmur, irregular heartbeat, dyspnea, lower extremity swelling, lower extremity claudication.    Gastrointestinal:Denies abdominal pain, nausea, vomiting and diarrhea  Genitourinary: Denies for dysuria or hematuria.   Hematologic/Lymphatic:  Denies lymphadenopathy, bleeding disorders, easy bruising or anemia.   Endocrine:  See HPI. Denies thyroid disease, polydipsia, polyuria, heat or cold intolerance.    Musculoskeletal:  Denies joint pain, back pain, muscle pain or muscle weakness.   Skin: Denies skin lesions, rashes, hives or hair loss on legs.    Psychiatric:  Denies depression, anxiety or panic attacks.    All other systems were reviewed and are negative except what is stated in the HPI    Physical Exam     Vitals:    06/30/19 1436 06/30/19 1439   BP: 135/71 146/76   BP Site: Left arm Right arm   Patient Position: Sitting Sitting   Cuff Size: Medium Medium   Pulse: 73 75   Temp: 97.6 F (36.4 C)    TempSrc: Temporal    SpO2: 98%    Weight: 61.1 kg (134 lb 12.8 oz)    Height: 1.626 m (5\' 4" )        Body mass index is 23.14 kg/m.    General:  Patient appears their stated age, well-nourished.  Alert and in no apparent distress.  HEENT:  Normpcephalic.  EOMI.    Neck:  No JVD.  Bilateral carotid bruits.   Lungs: Respiratory effort unlabored, chest expansion symmetric.  Cardiac: S1, S2. RRR, no murmurs, rubs or gallops.  Abd: Soft, nondistended, nontender.    Extremities:Full ROM, symmetric, warm, no ischemic changes, ulceration,  gangrene or edema.   Pulses: 2+ Bilateral femoral, popliteal, posterior tibial and dorsalis pedis pulses  Skin: Warm, no varicosities.    Neuro: Good insight and judgment, oriented to person, place, and time , gross motor and sensory intact. Stable gait.       Imaging   Bilateral carotid duplex imaging study performed May 07, 2019 at [facility] revealed 70%  stenosis to the right internal carotid artery with peak systolic velocity of 236 cm/s and end-diastolic velocity of 101 cm/s.  The right ICA/CCA ratio was 2.5.  To the left internal carotid artery there is a 50 to 69% stenosis with a peak systolic velocity of 186 cm/s and end-diastolic velocity 50.3 cm/s the left ICA/CCA ratio is 1.77.  Antegrade flow was seen in both vertebral arteries.    MRA of the neck performed at [facility] radiological consultants on June 16, 2019 revealed 75% stenosis of the proximal right internal carotid artery with 40% stenosis to the proximal left internal carotid artery.      Assessment and Plan   1. Asymptomatic carotid artery stenosis without infarction, bilateral    Based on the above findings, we have discussed with Ms. Blacketer that MRA report states 75% stenosis but there is significant motion artifact seen on review of the  images.  Furthermore the findings on recent carotid duplex imaging study show moderate but not critical carotid artery stenosis with ratio less than 4.   Therefore we would recommend monitoring the carotid stenosis closely at this time since she remains asymptomatic.  We would recommend maximal medical therapy to include initiation of 81 mg aspirin daily in addition to her Eliquis and continuation of atorvastatin and optimal control of her diabetes.    We have advised that she should repeat a carotid duplex imaging study at [facility] in 6 months and then we would like to see her back in follow-up at that time.  She and her son were educated on signs and symptoms to monitor for at represent lateralizing neurologic ischemia.     Thank you for the opportunity to care for your patient. Please do no hesitate to contact our office with any questions or concerns.        Sincerely,     Marcell Anger, M.D.     Milbert Coulter, ANP-BC    [facility]  [address]  [place] [address]  T [phone] ext. [phone]   F [phone]    This note was generated by the  [employer] EMR system/ Dragon speech recognition and may contain inherent errors or omissions not intended by the user. Grammatical errors, random word insertions, deletions, pronoun errors and incomplete sentences are occasional consequences of this technology due to software limitations. Not all errors are caught or corrected. If there are questions or concerns about the content of this note or information contained within the body of this dictation they should be addressed directly with the author for clarification

## 2019-07-01 ENCOUNTER — Encounter: Payer: Self-pay | Admitting: Hematology

## 2019-07-07 ENCOUNTER — Other Ambulatory Visit (FREE_STANDING_LABORATORY_FACILITY): Payer: Medicare Other

## 2019-07-07 DIAGNOSIS — D508 Other iron deficiency anemias: Secondary | ICD-10-CM

## 2019-07-07 LAB — CBC AND DIFFERENTIAL
Absolute NRBC: 0 10*3/uL (ref 0.00–0.00)
Basophils Absolute Automated: 0.06 10*3/uL (ref 0.00–0.08)
Basophils Automated: 0.8 %
Eosinophils Absolute Automated: 0.2 10*3/uL (ref 0.00–0.44)
Eosinophils Automated: 2.6 %
Hematocrit: 37.9 % (ref 34.7–43.7)
Hgb: 12.3 g/dL (ref 11.4–14.8)
Immature Granulocytes Absolute: 0.03 10*3/uL (ref 0.00–0.07)
Immature Granulocytes: 0.4 %
Lymphocytes Absolute Automated: 1.82 10*3/uL (ref 0.42–3.22)
Lymphocytes Automated: 23.8 %
MCH: 29.7 pg (ref 25.1–33.5)
MCHC: 32.5 g/dL (ref 31.5–35.8)
MCV: 91.5 fL (ref 78.0–96.0)
MPV: 9.7 fL (ref 8.9–12.5)
Monocytes Absolute Automated: 0.76 10*3/uL (ref 0.21–0.85)
Monocytes: 9.9 %
Neutrophils Absolute: 4.78 10*3/uL (ref 1.10–6.33)
Neutrophils: 62.5 %
Nucleated RBC: 0 /100 WBC (ref 0.0–0.0)
Platelets: 263 10*3/uL (ref 142–346)
RBC: 4.14 10*6/uL (ref 3.90–5.10)
RDW: 13 % (ref 11–15)
WBC: 7.65 10*3/uL (ref 3.10–9.50)

## 2019-07-07 LAB — IRON PROFILE
Iron Saturation: 31 % (ref 15–50)
Iron: 108 ug/dL (ref 40–145)
TIBC: 350 ug/dL (ref 265–497)
UIBC: 242 ug/dL (ref 126–382)

## 2019-07-07 LAB — IHS D-DIMER: D-Dimer: 0.28 ug/mL FEU (ref 0.00–0.60)

## 2019-07-07 LAB — HEMOLYSIS INDEX: Hemolysis Index: 5 (ref 0–18)

## 2019-07-07 LAB — FERRITIN: Ferritin: 32.29 ng/mL (ref 4.60–204.00)

## 2019-07-09 ENCOUNTER — Encounter (INDEPENDENT_AMBULATORY_CARE_PROVIDER_SITE_OTHER): Payer: Self-pay

## 2019-07-10 ENCOUNTER — Ambulatory Visit (INDEPENDENT_AMBULATORY_CARE_PROVIDER_SITE_OTHER): Payer: Medicare Other

## 2019-07-10 DIAGNOSIS — Z23 Encounter for immunization: Secondary | ICD-10-CM

## 2019-07-11 ENCOUNTER — Other Ambulatory Visit: Payer: Self-pay | Admitting: Diagnostic Radiology

## 2019-07-14 ENCOUNTER — Ambulatory Visit: Payer: Medicare Other | Attending: Hematology | Admitting: Hematology

## 2019-07-14 ENCOUNTER — Encounter: Payer: Self-pay | Admitting: Hematology

## 2019-07-14 ENCOUNTER — Telehealth: Payer: Self-pay | Admitting: Hematology

## 2019-07-14 VITALS — BP 131/69 | Ht 64.0 in | Wt 130.0 lb

## 2019-07-14 DIAGNOSIS — I82622 Acute embolism and thrombosis of deep veins of left upper extremity: Secondary | ICD-10-CM | POA: Insufficient documentation

## 2019-07-14 DIAGNOSIS — D508 Other iron deficiency anemias: Secondary | ICD-10-CM | POA: Insufficient documentation

## 2019-07-14 NOTE — Progress Notes (Signed)
VIDEO VISIT - [facility]      CONSENT: Verbal consent has been obtained from the patient to conduct a video visit encounter to minimize exposure to COVID-19: yes    Total time spent prior to the visit reviewing records and prior labs: 5 minutes  Total time spent face to face with patient: 10 minutes.  Total time spent after face to face for coordination of care and documentation: 5 minutes   Total time spent on encounter:  20 minutes  Telehealth Location: [facility], on-site  Regarding: rpv LUE provoked DVT , RUE DVT     HPI/HEMATOLOGY HISTORY: Tara Perry was seen today via video visit in hematology follow up.  She is a 74 y.o. female with history of DM, NICM, CKD, UE VTE events.    Patient was admitted 6/5-6/16/20 for VF cardiac arrest- underwent hypothermic protocol. s/p BiV ICD placement 08/26/18. She presented to [facility] 08/30/18 with LUE swelling. She was found to have acute subclavian DVT. She was started on Lovenox therapeutic. Her course was complicated by hematemesis on 09/01/18. EGD showed large duodenal ulcer with visible vessels s/p cauterization, clipping. Also noted to have RUE extensive R basilic occlusive thrombus and phlebitis 09/06/18. She resumed anticoagulation and was discharged on Lovenox 1mg /kg BID after stability of hemoglobin on a/c.     Switched to Eliquis 5mg  BID from Lovenox in September.     INTERVAL HISTORY:     Carotid artery stenosis noted on imaging, patient asymptomatic. Will monitor for 6 months and repeat imaging in October. Added in baby ASA daily.   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        carvedilol (Coreg) 12.5 MG tablet Take 12.5 mg by mouth 2 (two) times daily with meals         Entresto 49-51 MG Tab per tablet 2 (two) times daily         glimepiride (AMARYL) 2 MG tablet Take 2 mg by mouth daily      Iron-Vitamin C (VITRON-C PO) Take by mouth daily         Jardiance 25 MG tablet 25 mg every morning         metFORMIN (GLUCOPHAGE) 500 MG tablet Take 1 tablet (500 mg total) by mouth 2 (two) times daily Morning and night (Patient taking differently: Take 500 mg by mouth 2 (two) times daily with meals   )      Multiple Vitamins-Minerals (CENTRUM PO) Take by mouth daily         vitamin B-12 (CYANOCOBALAMIN) 100 MCG tablet Take 1,000 mcg by mouth daily          Allergies   Allergen Reactions    Shellfish-Derived Products Anaphylaxis     PAST HISTORY:  Past Medical History:   Diagnosis Date    Anemia June 2020    Atrophic vaginitis 08/30/2018    Bilateral cataracts     I have implants    Cardiac arrest 08/17/2018    VF arrest. She received 3 shocks, 1 round of epi with successful ROSC. 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    Type 2 diabetes mellitus, controlled     Viral cardiomyopathy 2008        Past Surgical History:   Procedure Laterality Date    COLONOSCOPY   2002    normal    EGD N/A 09/01/2018    Procedure: EGD;  Surgeon: Lilia Pro, MD;  Location: [facility];  Service: Gastroenterology;  Laterality: N/A;    EXPLORATORY LAPAROTOMY      EYE SURGERY      ICD IMPLANT BIV N/A 08/26/2018    Procedure: ICD IMPLANT BIV;  Surgeon: Len Childs, MD;  Location: [facility];  Service: Cardiovascular;  Laterality: N/A;  medtronic    INSERT / REPLACE / REMOVE PACEMAKER      Difibrillator August 26, 2018    LHC W/ CORONARY ANGIOS AND LV Left 08/21/2018    Procedure: LHC W/ CORONARY ANGIOS AND LV;  Surgeon: Burna Forts, MD;  Location: [facility];  Service: Cardiovascular;  Laterality: Left;    SMALL INTESTINE SURGERY  November 2014       Family History   Problem Relation Age of Onset    Cancer Father 64        bladder    Diabetes Father     Heart attack Father      Social History:   5 grandkids (79mo-19y/o)  Non smoker   Walks routinely     Patient Active Problem List   Diagnosis    Peritonitis    Type II diabetes mellitus without mention of complication    Gout synovitis    Type 2 diabetes mellitus without complications    Viral cardiomyopathy    CHF (congestive heart failure)    Cardiac arrest    Atrophic vaginitis    Chronic vulvitis    Arm DVT (deep venous thromboembolism), acute, left    Iron deficiency anemia    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   All other systems reviewed and negative except as above.   Gen/Constitutional: No fevers, no chills, no unintentional weight loss, no fatigue.  Eyes: No visual changes   ENT: no hearing loss, no nasal discharge, no epistaxis, no sore throat   Cdv: no chest pain, no palpitations  Resp: no cough, no shortness of breath, no wheezing  GI: no  abdominal pain, no heartburn, no nausea/vomiting,  no diarrhea, no constipation, no hematochezia, no melena   GU: no hematuria, no urinary frequency  Musculoskeletal: no joint pain, no joint swelling, no restricted motion, no peripheral edema, no varicosities   Skin: no rashes, no sores, no blisters  Neuro: no numbness, no tingling, no burning sensation  Psych: no nervousness, no anxiety, no depression  Endo: no heat/cold intolerance, no excessive thirst  Hem/Lymph: no bleeding (epistaxis, mucosal bleeding, hematemesis, hemoptysis, hematochezia, hematuria, melena), no easy bruising, no lymphadenopathy     PHYSICAL EXAMINATION  Vital Signs: BP 131/69 (BP Site: Right arm)    Ht 1.626 m (5\' 4" )    Wt 59 kg (130 lb)    BMI 22.31 kg/m      The following limited physical exam components were observed via video:    General Appearance:  Alert, cooperative, no distress, appears stated age   Head: Normocephalic, without obvious abnormality, atraumatic   Eyes:  Sclera anicteric, conjunctiva without pallor   Lungs:    Abdomen:   Breathing comfortably without accessory muscle use. Normal respiratory effort.   Non-distended   Musculoskeletal: No apparent gait or station abnormalities    Skin: Normal skin color, texture, no visible rashes or lesions   Extremities: No apparent edema   Neurologic:  Mental Status: Alert and oriented x3, face symmetric, tongue midline.  Normal mood and affect, cooperative, judgement intact     LABS/RADIOLOGY:     No visits with results within 1 Day(s) from this visit.   Latest known visit with results is:   Appointment on 07/07/2019   Component Date Value Ref Range Status    WBC 07/07/2019 7.65  3.10 - 9.50 x10 3/uL Final    Hgb 07/07/2019 12.3  11.4 - 14.8 g/dL Final    Hematocrit 16/12/9602 37.9  34.7 - 43.7 % Final    Platelets 07/07/2019 263  142 - 346 x10 3/uL Final    RBC 07/07/2019 4.14  3.90 - 5.10 x10 6/uL Final    MCV 07/07/2019 91.5  78.0 - 96.0 fL Final    MCH 07/07/2019 29.7  25.1 - 33.5 pg Final    MCHC 07/07/2019 32.5  31.5 -  35.8 g/dL Final    RDW 54/11/8117 13  11 - 15 % Final    MPV 07/07/2019 9.7  8.9 - 12.5 fL Final    Neutrophils 07/07/2019 62.5  None % Final    Lymphocytes Automated 07/07/2019 23.8  None % Final    Monocytes 07/07/2019 9.9  None % Final    Eosinophils Automated 07/07/2019 2.6  None % Final    Basophils Automated 07/07/2019 0.8  None % Final    Immature Granulocytes 07/07/2019 0.4  None % Final    Nucleated RBC 07/07/2019 0.0  0.0 - 0.0 /100 WBC Final    Neutrophils Absolute 07/07/2019 4.78  1.10 - 6.33 x10 3/uL Final    Lymphocytes Absolute Automated 07/07/2019 1.82  0.42 - 3.22 x10 3/uL Final    Monocytes Absolute Automated 07/07/2019 0.76  0.21 - 0.85 x10 3/uL Final    Eosinophils Absolute Automated 07/07/2019 0.20  0.00 - 0.44 x10 3/uL Final    Basophils Absolute Automated 07/07/2019 0.06  0.00 - 0.08 x10 3/uL Final    Immature Granulocytes Absolute 07/07/2019 0.03  0.00 - 0.07 x10 3/uL Final    Absolute NRBC 07/07/2019 0.00  0.00 - 0.00 x10 3/uL Final    Iron 07/07/2019 108  40 - 145 ug/dL Final  UIBC 07/07/2019 242  126 - 382 ug/dL Final    TIBC 66/44/0347 350  265 - 497 ug/dL Final    Iron Saturation 07/07/2019 31  15 - 50 % Final    Ferritin 07/07/2019 32.29  4.60 - 204.00 ng/mL Final    D-Dimer 07/07/2019 0.28  0.00 - 0.60 ug/mL FEU Final    Hemolysis Index 07/07/2019 5  0 - 18 Final     LUE Doppler U/S 09/01/18: IMPRESSION:   Extensive occlusive and nonocclusive left upper extremity DVT in the  region of left pacer wire, as detailed. Per discussion with clinical  team at the time of dictation, there are aware of this finding.    RUE Doppler U/S 09/07/18: IMPRESSION:    Extensive right basilic occlusive thrombus in associated  thrombophlebitis. Continuous flow right cephalic vein. Other findings as  above.    IMPRESSION/RECOMMENDATIONS: Ms. Donnellan is a 74 y.o. female with   1. Arm DVT (deep venous thromboembolism), acute, left    2. Other iron deficiency anemia      1. Provoked DVT:  LUE provoked dvt from pacer wires and acute illness. RUE DVT provoked in setting of line. She has completed 3 months therapeutic anticoagulation. Reviewed newest ultrasound reports from 01/10/19- no evidence of dvt but some minor chronic changes bilaterally. I am inclined to use prophylactic dosage anticoagulation going forward as opposed to stopping a/c completed as the ICD lead wire may very well be at risk /nidus for further recurrent clot formation-- though alternately, the lead would likely be endothelialized and perhaps pose less of a risk than prior. Patient is in agreement with continuation of low dose anticoagulation.   -D Dimer normal 06/2019, Cr nL  -Cont Eliquis 2.5mg  BID    2. Iron defn anemia: In setting of GI bleed.   -06/2019 CBC nL, iron sat nL, ferritin 33  -Cont Vitron C every other day for now    Pt instructed to send non urgent messages via MyChart and provided with [facility] clinic phone number. RTC October with labs prior.     Thank you for involving me in this patient's care.      Russ Halo, MD  Benign Hematology  [facility]  [address]  [place], [place] [address]  Phone: [phone]  Fax: [phone]

## 2019-07-14 NOTE — Telephone Encounter (Signed)
rtc October with me    rania plz order cbc cmp d dimer iron studies ferritin for prior to that visit and sched [facility] lab appt prior thanks

## 2019-07-14 NOTE — Telephone Encounter (Signed)
Labs ordered.    Front desk team:  -Please schedule [facility] lab appt in Oct prior to follow up appt.  -Please schedule RTC in October with Dr. Zollie Pee. Thanks!

## 2019-07-15 NOTE — Telephone Encounter (Signed)
Patient has been placed on the wait list for October with labs to be done prior.

## 2019-07-18 ENCOUNTER — Encounter: Payer: Self-pay | Admitting: Hematology

## 2019-08-30 ENCOUNTER — Emergency Department (HOSPITAL_COMMUNITY): Payer: Medicare Other

## 2019-08-30 ENCOUNTER — Other Ambulatory Visit: Payer: Self-pay

## 2019-08-30 ENCOUNTER — Emergency Department (HOSPITAL_COMMUNITY)
Admission: EM | Admit: 2019-08-30 | Discharge: 2019-08-30 | Disposition: A | Discharge status: Home / Self Care | Payer: Medicare Other | Attending: Emergency Medicine | Admitting: Emergency Medicine

## 2019-08-30 DIAGNOSIS — Y929 Unspecified place or not applicable: Secondary | ICD-10-CM | POA: Diagnosis not present

## 2019-08-30 DIAGNOSIS — W19XXXA Unspecified fall, initial encounter: Secondary | ICD-10-CM

## 2019-08-30 DIAGNOSIS — Y939 Activity, unspecified: Secondary | ICD-10-CM | POA: Insufficient documentation

## 2019-08-30 DIAGNOSIS — W010XXA Fall on same level from slipping, tripping and stumbling without subsequent striking against object, initial encounter: Secondary | ICD-10-CM | POA: Diagnosis not present

## 2019-08-30 DIAGNOSIS — Y999 Unspecified external cause status: Secondary | ICD-10-CM | POA: Insufficient documentation

## 2019-08-30 DIAGNOSIS — S0990XA Unspecified injury of head, initial encounter: Secondary | ICD-10-CM | POA: Insufficient documentation

## 2019-08-30 MED ORDER — TETANUS-DIPHTH-ACELL PERTUSSIS 5-2.5-18.5 LF-MCG/0.5 IM SUSP
0.5000 mL | Freq: Once | INTRAMUSCULAR | Status: DC
  Filled 2019-08-30: qty 0.5

## 2019-08-30 NOTE — Discharge Instructions (Addendum)
Return for any problem.  Follow-up with your regular care provider as instructed.  If you develop headache, confusion, or other concerning symptoms please return to the ED for further evaluation.  Apply ice to affected contusions as instructed.  Use caution when ambulating to avoid further falls.

## 2019-08-30 NOTE — ED Triage Notes (Addendum)
Charge RN Notified to activate Level 2 trauma on pt's arrival.  Pt tripped going up a step within the last hour.  Denies LOC.  Hematoma to R forehead.  Pt takes Eliquis.  Pt straight to treatment room.

## 2019-08-30 NOTE — ED Provider Notes (Signed)
[facility] EMERGENCY DEPARTMENT Provider Note   CSN: [id number] Arrival date & time: 08/30/19  1636     History Chief Complaint  Patient presents with  . Level 2- fall/blood thinner    Morgan Carr is a 74 y.o. female.  74 year old female with prior medical history detailed below presents for evaluation following reported fall.  Patient reports that she tripped on an uneven step.  She fell forward.  She struck her right forehead.  She did not pass out.  She complains of pain and swelling to the right forehead.  She denies neck pain.  She denies numbness or tingling.  She does report mild epistaxis at the time of the fall.  This bleeding has resolved.  She also complains of mild abrasions to both anterior knees.  She has been ambulatory since the fall.  She denies associated chest pain, shortness of breath, nausea, vomiting, or other acute complaint.  The history is provided by the patient and medical records.  Fall This is a new problem. The current episode started less than 1 hour ago. The problem occurs rarely. The problem has been resolved. Pertinent negatives include no chest pain, no abdominal pain, no headaches and no shortness of breath. Nothing aggravates the symptoms. Nothing relieves the symptoms.       No past medical history on file.  There are no problems to display for this patient.     OB History   No obstetric history on file.     No family history on file.  Social History   Tobacco Use  . Smoking status: Not on file  Substance Use Topics  . Alcohol use: Not on file  . Drug use: Not on file    Home Medications Prior to Admission medications   Not on File    Allergies    Patient has no allergy information on record.  Review of Systems   Review of Systems  Respiratory: Negative for shortness of breath.   Cardiovascular: Negative for chest pain.  Gastrointestinal: Negative for abdominal pain.  Neurological: Negative for  headaches.  All other systems reviewed and are negative.   Physical Exam Updated Vital Signs BP (!) 146/61   Pulse 74   Temp 97.9 F (36.6 C) (Oral)   Resp 20   Ht 5\' 4"  (1.626 m)   Wt 60.3 kg   SpO2 97%   BMI 22.83 kg/m   Physical Exam Vitals and nursing note reviewed.  Constitutional:      General: She is not in acute distress.    Appearance: She is well-developed.  HENT:     Head: Normocephalic.     Comments: Contusion over the right forehead.    Nose:     Comments: Resolved epistaxis - no active bleeding, no septal hematoma. Eyes:     Conjunctiva/sclera: Conjunctivae normal.     Pupils: Pupils are equal, round, and reactive to light.  Cardiovascular:     Rate and Rhythm: Normal rate and regular rhythm.     Heart sounds: Normal heart sounds.  Pulmonary:     Effort: Pulmonary effort is normal. No respiratory distress.     Breath sounds: Normal breath sounds.  Abdominal:     General: There is no distension.     Palpations: Abdomen is soft.     Tenderness: There is no abdominal tenderness.  Musculoskeletal:        General: No deformity. Normal range of motion.     Cervical back: Normal range  of motion and neck supple.  Skin:    General: Skin is warm and dry.     Comments: Small abrasions to the anterior aspects of both knees.  Full active range of motion of both knees noted.  Patient was ambulatory after the fall.  Neurological:     Mental Status: She is alert and oriented to person, place, and time.     ED Results / Procedures / Treatments   Labs (all labs ordered are listed, but only abnormal results are displayed) Labs Reviewed - No data to display  EKG EKG Interpretation  Date/Time:  Saturday August 30 2019 17:00:32 EDT Ventricular Rate:  77 PR Interval:    QRS Duration: 149 QT Interval:  449 QTC Calculation: 509 R Axis:   84 Text Interpretation: Sinus rhythm Nonspecific intraventricular conduction delay Anteroseptal infarct, old Confirmed by  Kristine Royal [phone]) on 08/30/2019 5:01:32 PM   Radiology CT Head Wo Contrast  Result Date: 08/30/2019 CLINICAL DATA:  Status post trauma. EXAM: CT HEAD WITHOUT CONTRAST CT MAXILLOFACIAL WITHOUT CONTRAST TECHNIQUE: Multidetector CT imaging of the head and maxillofacial structures were performed using the standard protocol without intravenous contrast. Multiplanar CT image reconstructions of the maxillofacial structures were also generated. COMPARISON:  None. FINDINGS: CT HEAD FINDINGS Brain: There is mild cerebral atrophy with widening of the extra-axial spaces and ventricular dilatation. There are areas of decreased attenuation within the white matter tracts of the supratentorial brain, consistent with microvascular disease changes. Vascular: No hyperdense vessel or unexpected calcification. Skull: Normal. Negative for fracture or focal lesion. Other: There is mild to moderate severity right supraorbital and right frontal scalp soft tissue swelling. CT MAXILLOFACIAL FINDINGS Osseous: No fracture or mandibular dislocation. No destructive process. Orbits: Negative. No traumatic or inflammatory finding. Sinuses: Clear. Soft tissues: There is mild to moderate severity right supraorbital and right frontal scalp soft tissue swelling. An associated right frontal scalp hematoma is noted. IMPRESSION: 1. Mild to moderate severity right supraorbital and right frontal scalp soft tissue swelling with an associated right frontal scalp hematoma. 2. No evidence of acute fracture or acute intracranial process. Electronically Signed   By: Aram Candela M.D.   On: 08/30/2019 17:29   CT Maxillofacial WO CM  Result Date: 08/30/2019 CLINICAL DATA:  Status post fall. EXAM: CT HEAD WITHOUT CONTRAST CT MAXILLOFACIAL WITHOUT CONTRAST TECHNIQUE: Multidetector CT imaging of the head and maxillofacial structures were performed using the standard protocol without intravenous contrast. Multiplanar CT image reconstructions of the  maxillofacial structures were also generated. COMPARISON:  None. FINDINGS: CT HEAD FINDINGS Brain: There is mild cerebral atrophy with widening of the extra-axial spaces and ventricular dilatation. There are areas of decreased attenuation within the white matter tracts of the supratentorial brain, consistent with microvascular disease changes. Vascular: No hyperdense vessel or unexpected calcification. Skull: Normal. Negative for fracture or focal lesion. Other: There is mild to moderate severity right supraorbital and right frontal scalp soft tissue swelling. CT MAXILLOFACIAL FINDINGS Osseous: No fracture or mandibular dislocation. No destructive process. Orbits: Negative. No traumatic or inflammatory finding. Sinuses: Clear. Soft tissues: There is mild to moderate severity right supraorbital and right frontal scalp soft tissue swelling. An associated right frontal scalp hematoma is noted. IMPRESSION: 1. Mild to moderate severity right supraorbital and right frontal scalp soft tissue swelling with associated right frontal scalp hematoma. 2. No evidence of acute fracture or acute intracranial process. Electronically Signed   By: Aram Candela M.D.   On: 08/30/2019 17:32    Procedures Procedures (  including critical care time)  Medications Ordered in ED Medications  Tdap (BOOSTRIX) injection 0.5 mL (0.5 mLs Intramuscular Not Given 08/30/19 1725)    ED Course  I have reviewed the triage vital signs and the nursing notes.  Pertinent labs & imaging results that were available during my care of the patient were reviewed by me and considered in my medical decision making (see chart for details).    MDM Rules/Calculators/A&P                          MDM  Screen complete  Morgan Carr was evaluated in Emergency Department on 08/30/2019 for the symptoms described in the history of present illness. She was evaluated in the context of the global COVID-19 pandemic, which necessitated consideration that  the patient might be at risk for infection with the SARS-CoV-2 virus that causes COVID-19. Institutional protocols and algorithms that pertain to the evaluation of patients at risk for COVID-19 are in a state of rapid change based on information released by regulatory bodies including the CDC and federal and state organizations. These policies and algorithms were followed during the patient's care in the ED.  Patient is presenting for evaluation following reported fall.  Patient's fall was secondary to missing a step while ambulating.  Patient's injuries do not appear to be significant upon initial evaluation.  CT and plain films did not reveal evidence of intercranial injury or bleed.  Patient appears to be appropriate for discharge.  She and her family understand the need for close follow-up.  Strict return precautions given and understood.   Final Clinical Impression(s) / ED Diagnoses Final diagnoses:  Fall, initial encounter  Closed head injury, initial encounter    Rx / DC Orders ED Discharge Orders    None       Wynetta Fines, MD 08/30/19 1819

## 2019-08-30 NOTE — ED Notes (Signed)
Patient verbalizes understanding of discharge instructions. Opportunity for questioning and answers were provided. Armband removed by staff, pt discharged from ED ambulatory w/ son 

## 2019-09-03 ENCOUNTER — Other Ambulatory Visit (INDEPENDENT_AMBULATORY_CARE_PROVIDER_SITE_OTHER): Payer: Self-pay

## 2019-09-03 NOTE — Telephone Encounter (Signed)
Pt called to report she missed a step and fell on her face.  She is also bruised on top of and around the defibrillator.  She denies having pre-sycope or syncope.  She calls to see if she can do a manual transmission to make sure it's working propersly.  Trx'd pt to [facility] to arrange this.

## 2019-09-07 ENCOUNTER — Encounter (INDEPENDENT_AMBULATORY_CARE_PROVIDER_SITE_OTHER): Payer: Medicare Other | Admitting: Cardiovascular Disease

## 2019-09-07 DIAGNOSIS — Z95810 Presence of automatic (implantable) cardiac defibrillator: Secondary | ICD-10-CM

## 2019-09-09 ENCOUNTER — Encounter (INDEPENDENT_AMBULATORY_CARE_PROVIDER_SITE_OTHER): Payer: Self-pay

## 2019-09-09 NOTE — Progress Notes (Signed)
09/09/2019 Patient sent Remote Transmission. Battery life and leads stable. Additional information available upon request.

## 2019-10-12 ENCOUNTER — Other Ambulatory Visit: Payer: Self-pay | Admitting: Nurse Practitioner

## 2019-10-12 DIAGNOSIS — E7800 Pure hypercholesterolemia, unspecified: Secondary | ICD-10-CM

## 2019-10-24 ENCOUNTER — Other Ambulatory Visit (INDEPENDENT_AMBULATORY_CARE_PROVIDER_SITE_OTHER): Payer: Self-pay | Admitting: Cardiovascular Disease

## 2019-10-24 DIAGNOSIS — I5042 Chronic combined systolic (congestive) and diastolic (congestive) heart failure: Secondary | ICD-10-CM

## 2019-11-24 ENCOUNTER — Other Ambulatory Visit (INDEPENDENT_AMBULATORY_CARE_PROVIDER_SITE_OTHER): Payer: Self-pay | Admitting: Cardiovascular Disease

## 2019-11-24 DIAGNOSIS — I6523 Occlusion and stenosis of bilateral carotid arteries: Secondary | ICD-10-CM

## 2019-12-07 ENCOUNTER — Encounter (INDEPENDENT_AMBULATORY_CARE_PROVIDER_SITE_OTHER): Payer: Medicare Other | Admitting: Cardiovascular Disease

## 2019-12-07 DIAGNOSIS — Z95810 Presence of automatic (implantable) cardiac defibrillator: Secondary | ICD-10-CM

## 2019-12-15 ENCOUNTER — Ambulatory Visit
Admission: RE | Admit: 2019-12-15 | Discharge: 2019-12-15 | Disposition: A | Discharge status: Home / Self Care | Payer: Medicare Other | Source: Ambulatory Visit | Attending: Cardiovascular Disease | Admitting: Cardiovascular Disease

## 2019-12-15 DIAGNOSIS — I6523 Occlusion and stenosis of bilateral carotid arteries: Secondary | ICD-10-CM

## 2019-12-17 ENCOUNTER — Telehealth (INDEPENDENT_AMBULATORY_CARE_PROVIDER_SITE_OTHER): Payer: Self-pay

## 2019-12-17 DIAGNOSIS — I6523 Occlusion and stenosis of bilateral carotid arteries: Secondary | ICD-10-CM

## 2019-12-17 NOTE — Telephone Encounter (Addendum)
Called patient, relayed carotid duplex results per Dr. Fidel Levy. Patient verbalized understanding. Patient has an appointment with Dr. Zollie Pee 12/24/19. Rec RFM and cont meds as prescribed (on aspirin/atorvastatin per chart). Patient will cont to monitor for any neurological symptoms.     ----- Message from Brett Fairy, MD sent at 12/16/2019 11:36 PM EDT -----  Pls call re carotid duplex.  Had similar R >70 and by MRA in 06/2019 was 75%,   PSV marginally increased.  Recommend follow up with Maseer Bade and at minimum repeat study at 3 months to reassess and if progressed then repeat MRA  cont RFM and fu TH. Thanks

## 2019-12-22 ENCOUNTER — Encounter (INDEPENDENT_AMBULATORY_CARE_PROVIDER_SITE_OTHER): Payer: Self-pay

## 2019-12-22 NOTE — Progress Notes (Signed)
Room:         Appt Date/Time:10/18 415pm   74 y.o. female   PCP: Tammi Sou, MD  Referring Physician:    New Patient Follow-Up Post-op   [place] [facility] [place] [facility] [employer] Other Imaging  -need carotid US report from [facility] heart      Chief Complaint/HPI: 6 month f/u- carotid stenosis   Date of Last Visit: 06/30/19    Allergies:   Allergies   Allergen Reactions    Shellfish-Derived Products Anaphylaxis        Selected Medications:    Coumadin Xarelto Eliquis Pradaxa Lovenox Other thinner:    Plavix Aspirin Brillinta Effient  Pletal Trental Fish Oil   Insulin Metformin Other Diabetes Atorvastatin Rosuvastatin Other Chol:    MHx:  Stroke / TIA / Seizures HTN / HLD  Diabetes   MI / CAD A-fib / Arrhythmia  COPD / Asthma / (other lung)   Kidney Disease Liver Disease DVT / Coagulopathy   Wounds Spine / other MSK  Cancer            Smoker           Former Smoker      x    Never Smoker    Vital Signs this Visit Pulses  HT  BP R BP L Temp   Rad Ulnar Brach Fem Pop DP PT        R          WT  Pulse R Pulse L SpO2       L          Imaging results:        Plan of Care:

## 2019-12-24 ENCOUNTER — Ambulatory Visit: Payer: Medicare Other | Attending: Hematology | Admitting: Hematology

## 2019-12-24 ENCOUNTER — Other Ambulatory Visit (FREE_STANDING_LABORATORY_FACILITY): Payer: Medicare Other

## 2019-12-24 ENCOUNTER — Encounter: Payer: Self-pay | Admitting: Hematology

## 2019-12-24 VITALS — BP 133/74 | HR 79 | Temp 98.5°F | Wt 140.0 lb

## 2019-12-24 DIAGNOSIS — I82622 Acute embolism and thrombosis of deep veins of left upper extremity: Secondary | ICD-10-CM

## 2019-12-24 DIAGNOSIS — D508 Other iron deficiency anemias: Secondary | ICD-10-CM

## 2019-12-24 LAB — CBC AND DIFFERENTIAL
Absolute NRBC: 0 10*3/uL (ref 0.00–0.00)
Basophils Absolute Automated: 0.07 10*3/uL (ref 0.00–0.08)
Basophils Automated: 0.9 %
Eosinophils Absolute Automated: 0.3 10*3/uL (ref 0.00–0.44)
Eosinophils Automated: 3.7 %
Hematocrit: 37.5 % (ref 34.7–43.7)
Hgb: 12.9 g/dL (ref 11.4–14.8)
Immature Granulocytes Absolute: 0.02 10*3/uL (ref 0.00–0.07)
Immature Granulocytes: 0.2 %
Lymphocytes Absolute Automated: 2.19 10*3/uL (ref 0.42–3.22)
Lymphocytes Automated: 27 %
MCH: 31.1 pg (ref 25.1–33.5)
MCHC: 34.4 g/dL (ref 31.5–35.8)
MCV: 90.4 fL (ref 78.0–96.0)
MPV: 9.9 fL (ref 8.9–12.5)
Monocytes Absolute Automated: 0.84 10*3/uL (ref 0.21–0.85)
Monocytes: 10.4 %
Neutrophils Absolute: 4.69 10*3/uL (ref 1.10–6.33)
Neutrophils: 57.8 %
Nucleated RBC: 0 /100 WBC (ref 0.0–0.0)
Platelets: 252 10*3/uL (ref 142–346)
RBC: 4.15 10*6/uL (ref 3.90–5.10)
RDW: 12 % (ref 11–15)
WBC: 8.11 10*3/uL (ref 3.10–9.50)

## 2019-12-24 LAB — IRON PROFILE
Iron Saturation: 30 % (ref 15–50)
Iron: 87 ug/dL (ref 40–145)
TIBC: 294 ug/dL (ref 265–497)
UIBC: 207 ug/dL (ref 126–382)

## 2019-12-24 LAB — COMPREHENSIVE METABOLIC PANEL
ALT: 14 U/L (ref 0–55)
AST (SGOT): 18 U/L (ref 5–34)
Albumin/Globulin Ratio: 1.2 (ref 0.9–2.2)
Albumin: 3.9 g/dL (ref 3.5–5.0)
Alkaline Phosphatase: 85 U/L (ref 37–106)
Anion Gap: 12 (ref 5.0–15.0)
BUN: 24.4 mg/dL — ABNORMAL HIGH (ref 7.0–19.0)
Bilirubin, Total: 0.4 mg/dL (ref 0.1–1.2)
CO2: 20 mEq/L — ABNORMAL LOW (ref 21–29)
Calcium: 9.3 mg/dL (ref 7.9–10.2)
Chloride: 106 mEq/L (ref 100–111)
Creatinine: 1.6 mg/dL — ABNORMAL HIGH (ref 0.4–1.5)
Globulin: 3.2 g/dL (ref 2.0–3.7)
Glucose: 241 mg/dL — ABNORMAL HIGH (ref 70–100)
Potassium: 4.5 mEq/L (ref 3.5–5.1)
Protein, Total: 7.1 g/dL (ref 6.0–8.3)
Sodium: 138 mEq/L (ref 136–145)

## 2019-12-24 LAB — IHS D-DIMER: D-Dimer: 0.22 ug/mL FEU (ref 0.00–0.60)

## 2019-12-24 LAB — GFR: EGFR: 31.4

## 2019-12-24 LAB — HEMOLYSIS INDEX: Hemolysis Index: 6 (ref 0–24)

## 2019-12-24 LAB — FERRITIN: Ferritin: 44.7 ng/mL (ref 4.60–204.00)

## 2019-12-24 NOTE — Progress Notes (Signed)
OFFICE VISIT - [facility]        Total time spent prior to the visit reviewing records and prior labs: 5 minutes  Total time spent face to face with patient: 10 minutes.  Total time spent after face to face for coordination of care and documentation: 5 minutes   Total time spent on encounter: 20 minutes  Regarding: rpv LUE provoked DVT , RUE DVT     HPI/HEMATOLOGY HISTORY: Tara Perry was seen today via video visit in hematology follow up.  She is a 74 y.o. female with history of DM, NICM, CKD, UE VTE events.    Patient was admitted 6/5-6/16/20 for VF cardiac arrest- underwent hypothermic protocol. s/p BiV ICD placement 08/26/18. She presented to [facility] 08/30/18 with LUE swelling. She was found to have acute subclavian DVT. She was started on Lovenox therapeutic. Her course was complicated by hematemesis on 09/01/18. EGD showed large duodenal ulcer with visible vessels s/p cauterization, clipping. Also noted to have RUE extensive R basilic occlusive thrombus and phlebitis 09/06/18. She resumed anticoagulation and was discharged on Lovenox 1mg /kg BID after stability of hemoglobin on a/c.     Switched to Eliquis 5mg  BID from Lovenox in September.     COVID vaccinated      INTERVAL HISTORY:     Continues Eliquis 2.5mg  BID. Tara Perry in June walking downstairs and had severe bruising on face, no bleeding, presented to ED since she is on blood thinner. CT head neg/nrm. Has only started to feel better recently. 10/4 repeat carotid U/S doppler reveals severe plaque R>L. Seeing Dr. Elesa Hacker in Vascular next week. Denies chest pain, SOB, heart palpitations. Denies UE swelling. Reports b/l shoulder pain at night while she is sleeping, relieved with Tylenol, feels muscular or maybe arthritic. Taking Vitron C every other day, tolerating well. Denies PICA, RLS. Continues ASA daily.     MEDICATIONS/ALLERGIES:     Outpatient Medications Marked as Taking for the 12/24/19 encounter (Office Visit) with Russ Halo, MD    Medication Sig Dispense Refill    apixaban (Eliquis) 2.5 MG Take by mouth      aspirin EC 81 MG EC tablet Take 81 mg by mouth daily      atorvastatin (LIPITOR) 40 MG tablet TAKE 2 TABLETS AT BEDTIME 180 tablet 3    carvedilol (Coreg) 12.5 MG tablet Take 12.5 mg by mouth 2 (two) times daily with meals         Entresto 49-51 MG Tab per tablet TAKE 1 TABLET TWICE A DAY 180 tablet 3    glimepiride (AMARYL) 2 MG tablet Take 2 mg by mouth daily      Iron-Vitamin C (VITRON-C PO) Take by mouth daily         Jardiance 25 MG tablet 25 mg every morning         metFORMIN (GLUCOPHAGE) 500 MG tablet Take 1 tablet (500 mg total) by mouth 2 (two) times daily Morning and night (Patient taking differently: Take 500 mg by mouth 2 (two) times daily with meals   )      Multiple Vitamins-Minerals (CENTRUM PO) Take by mouth daily         vitamin B-12 (CYANOCOBALAMIN) 100 MCG tablet Take 1,000 mcg by mouth daily          Allergies   Allergen Reactions    Shellfish-Derived Products Anaphylaxis     PAST HISTORY:    Past Medical History:   Diagnosis Date    Anemia  June 2020    Atrophic vaginitis 08/30/2018    Bilateral cataracts     I have implants    Cardiac arrest 08/17/2018    VF arrest. She received 3 shocks, 1 round of epi with successful ROSC. 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    Type 2 diabetes mellitus, controlled     Viral cardiomyopathy 2008        Past Surgical History:   Procedure Laterality Date    COLONOSCOPY  2002     normal    EGD N/A 09/01/2018    Procedure: EGD;  Surgeon: Lilia Pro, MD;  Location: [facility];  Service: Gastroenterology;  Laterality: N/A;    EXPLORATORY LAPAROTOMY      EYE SURGERY      ICD IMPLANT BIV N/A 08/26/2018    Procedure: ICD IMPLANT BIV;  Surgeon: Len Childs, MD;  Location: [facility];  Service: Cardiovascular;  Laterality: N/A;  [employer]    INSERT / REPLACE / REMOVE PACEMAKER      Difibrillator August 26, 2018    LHC W/ CORONARY ANGIOS AND LV Left 08/21/2018    Procedure: LHC W/ CORONARY ANGIOS AND LV;  Surgeon: Burna Forts, MD;  Location: [facility];  Service: Cardiovascular;  Laterality: Left;    SMALL INTESTINE SURGERY  November 2014       Family History   Problem Relation Age of Onset    Cancer Father 64        bladder    Diabetes Father     Heart attack Father      Social History:   5 grandkids (31mo-19y/o)  Non smoker   Walks routinely     Patient Active Problem List   Diagnosis    Peritonitis    Type II diabetes mellitus without mention of complication    Gout synovitis    Type 2 diabetes mellitus without complications    Viral cardiomyopathy    CHF (congestive heart failure)    Cardiac arrest    Atrophic vaginitis    Chronic vulvitis    Arm DVT (deep venous thromboembolism), acute, left    Iron deficiency anemia    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   All other systems reviewed and negative except as above.   Gen/Constitutional: No fevers, no chills, no unintentional weight loss, no fatigue.  Eyes: No visual changes   ENT: no hearing loss, no nasal discharge, no epistaxis, no sore throat   Cdv: no chest pain, no palpitations  Resp: no cough, no shortness of breath, no wheezing  GI: no abdominal pain, no heartburn, no nausea/vomiting, no  diarrhea, no constipation,  no hematochezia, no melena   GU: no hematuria, no urinary frequency  Musculoskeletal: no joint pain, no joint swelling, no restricted motion, no peripheral edema, no varicosities   Skin: no rashes, no sores, no blisters  Neuro: no numbness, no tingling, no burning sensation  Psych: no nervousness, no anxiety, no depression  Endo: no heat/cold intolerance, no excessive thirst  Hem/Lymph: no bleeding (epistaxis, mucosal bleeding, hematemesis, hemoptysis, hematochezia, hematuria, melena), no easy bruising, no lymphadenopathy     PHYSICAL EXAMINATION  Vital Signs: BP 133/74 (BP Site: Right arm, Patient Position: Sitting, Cuff Size: Medium)    Pulse 79    Temp 98.5 F (36.9 C) (Oral)    Wt 63.5 kg (140 lb)    SpO2 97%    BMI 24.03 kg/m        General Appearance:  Alert, cooperative, no distress, appears stated age   Head: Normocephalic, without obvious abnormality, atraumatic   Eyes:  Sclera anicteric, conjunctiva without pallor   Lungs:    Abdomen:   Lung sounds CTA b/l     Non-distended   Musculoskeletal: No gait abnormalities    Skin: Normal skin color, texture, no bleeding or ecchymoses, no rashes or lesions   Extremities: No UE or LE edema   Neurologic:  Mental Status: Alert and oriented x3, face symmetric, tongue midline.  Normal mood and affect, cooperative     LABS/RADIOLOGY:     Appointment on 12/24/2019   Component Date Value Ref Range Status    WBC 12/24/2019 8.11  3.1 - 9.5 x10 3/uL Final    Hgb 12/24/2019 12.9  11.4 - 14.8 g/dL Final    Hematocrit 24/40/1027 37.5  34.7 - 43.7 % Final    Platelets 12/24/2019 252  142 - 346 x10 3/uL Final    RBC 12/24/2019 4.15  3.90 - 5.10 x10 6/uL Final    MCV 12/24/2019 90.4  78.0 - 96.0 fL Final    MCH 12/24/2019 31.1  25.1 - 33.5 pg Final    MCHC 12/24/2019 34.4  31 - 35 g/dL Final    RDW 25/36/6440 12  11.0 - 15.0 % Final    MPV 12/24/2019 9.9  8.9 - 12.5 fL Final    Neutrophils 12/24/2019 57.8  None % Final    Lymphocytes  Automated 12/24/2019 27.0  None % Final    Monocytes 12/24/2019 10.4  None % Final    Eosinophils Automated 12/24/2019 3.7  None % Final    Basophils Automated 12/24/2019 0.9  None % Final    Immature Granulocytes 12/24/2019 0.2  None % Final    Nucleated RBC 12/24/2019 0.0  0.0 - 0.0 /100 WBC Final    Neutrophils Absolute 12/24/2019 4.69  1 - 6 x10 3/uL Final    Lymphocytes Absolute Automated 12/24/2019 2.19  0.42 - 3.22 x10 3/uL Final    Monocytes Absolute Automated 12/24/2019 0.84  0.21 - 0.85 x10 3/uL Final    Eosinophils Absolute Automated 12/24/2019 0.30  0.00 - 0.44 x10 3/uL Final    Basophils Absolute Automated 12/24/2019 0.07  0.00 - 0.08 x10 3/uL Final    Immature Granulocytes Absolute 12/24/2019 0.02  0.00 - 0.07 x10 3/uL Final    Absolute NRBC 12/24/2019 0.00  0.00 - 0.00 x10 3/uL Final     LUE Doppler U/S 09/01/18: IMPRESSION:   Extensive occlusive and nonocclusive left upper extremity DVT in the  region of left pacer wire, as detailed. Per discussion with clinical  team at the time of dictation,  there are aware of this finding.    RUE Doppler U/S 09/07/18: IMPRESSION:    Extensive right basilic occlusive thrombus in associated  thrombophlebitis. Continuous flow right cephalic vein. Other findings as  Above.    US Carotid dopp b/l 12/15/2019   Conclusion:  1. Right carotid system demonstrates severe plaque with 70-99% stenosis of  the internal carotid artery by Doppler criteria.    2. Left carotid system demonstrates severe plaque with 50-69% stenosis of  the internal carotid artery by Doppler criteria.    3. Normal antegrade flow in both vertebral arteries.    4. Compared to February 2021, there is similarly severe plaque R>L with  maringal increase in PSV (from 236cm/s) on R but similar estimated  stenoses.  Findings  RCCA:   Patent. No significant plaque visible. No color flow turbulence.  RICA:   Patent. Severe irregular homogeneous plaque visible in the carotid          bulb and  proximal internal carotid arteries. There is partial          acoustic shadowing which may limit Doppler sampling. Doppler          velocities are moderately elevated. Estimated stenosis is 70-99%.  RECA:   Patent. No significant plaque visible. No color flow turbulence.  RVERT:  Patent with normal antegrade flow.  LCCA:   Patent. Minimal smooth heterogeneous plaque visible. No color flow          turbulence.  LICA:   Patent. Severe irregular heterogeneous plaque visible in the          carotid bulb and proximal internal carotid artery obscured in          significant acoustic shadowing. Doppler velocities are mildly          elevated but may be underestimated due to shadowing limiting          sampling. Estimated stenosis is 50-69%.  LECA:   Patent. Minimal smooth homogeneous plaque visible. No color flow          turbulence.  LVERT:  Patent with normal antegrade flow    IMPRESSION/RECOMMENDATIONS: Tara Perry is a 75 y.o. female with   1. Arm DVT (deep venous thromboembolism), acute, left    2. Other iron deficiency anemia      1. Provoked DVT: LUE provoked dvt from pacer wires and acute illness. RUE DVT provoked in setting of line. She has completed 3 months therapeutic anticoagulation. Reviewed newest ultrasound reports from 01/10/19- no evidence of dvt but some minor chronic changes bilaterally. I am inclined to use prophylactic dosage anticoagulation going forward as opposed to stopping a/c completed as the ICD lead wire may very well be at risk /nidus for further recurrent clot formation-- though alternately, the lead would likely be endothelialized and perhaps pose less of a risk than prior. Patient is in agreement with continuation of low dose anticoagulation.   -D Dimer pending today, Cr 1.6 (stable, patient's baseline)  -Cont Eliquis 2.5mg  BID    2. Iron defn anemia: history of IDA in setting of GI bleed.   - CBC nL today, iron sat and ferritin pending   - Cont Vitron C every other day    Pt instructed to  send non urgent messages via MyChart and provided with [facility] clinic phone number. RTC 6 months.    Thank you for involving me in this patient's care.      Russ Halo, MD  Benign Hematology  [facility]  [address], [place] [address]  Phone: [phone]  Fax: [phone]

## 2019-12-29 ENCOUNTER — Encounter: Payer: Self-pay | Admitting: Hematology

## 2019-12-29 ENCOUNTER — Ambulatory Visit (INDEPENDENT_AMBULATORY_CARE_PROVIDER_SITE_OTHER): Payer: Medicare Other | Admitting: Specialist

## 2019-12-29 ENCOUNTER — Other Ambulatory Visit (INDEPENDENT_AMBULATORY_CARE_PROVIDER_SITE_OTHER): Payer: Medicare Other

## 2019-12-29 ENCOUNTER — Encounter (INDEPENDENT_AMBULATORY_CARE_PROVIDER_SITE_OTHER): Payer: Self-pay | Admitting: Specialist

## 2019-12-29 VITALS — BP 134/78 | HR 70 | Temp 98.2°F | Ht 64.0 in | Wt 140.6 lb

## 2019-12-29 DIAGNOSIS — I6521 Occlusion and stenosis of right carotid artery: Secondary | ICD-10-CM

## 2019-12-29 NOTE — Progress Notes (Addendum)
[facility]      No chief complaint on file.    History of Present Illness     Tara Perry is a 74 y.o. female who returns the office for 76-month follow-up appointment.  This appointment is to review a carotid duplex performed at [facility] with her cardiologist Dr.Tariq Nash Shearer.      She reports known asymptomatic carotid stenosis that has been monitored for the last few years until recent worsening on carotid duplex which prompted additional imaging with MRA that revealed a 75% right internal carotid stenosis, however there was significant motion artifact.  She has known CKD stage III with recent GFR of 31 and therefore CTA was not recommended.      She denies history of lateralizing neurologic ischemia.  She has no prior history of amaurosis fugax, TIA or CVA.    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  She has history of DVT in the left upper extremity after its placement and has been on Eliquis therapy.    Other past medical history includes diabetes mellitus, gout and cardiomyopathy.    The patient had carotid duplex scan performed at [facility] on October 4.  This revealed increased peak systolic velocity in the right internal carotid artery to 254 cm/s.  This is increased from 236 cm/s in the duplex performed in February 24.  Additionally the ICA to CCA ratio has increased to 2.9, from 2.5 in February.  She remains asymptomatic.    Past Medical History     Past Medical History:   Diagnosis Date    Anemia June 2020    Atrophic vaginitis 08/30/2018    Bilateral cataracts     I have implants    Cardiac arrest 08/17/2018    VF arrest. She received 3 shocks, 1 round of epi with successful ROSC. 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/2018    per patient, he only sees peripheral view    Diabetes mellitus 1992     Encounter for blood transfusion     Gout spondylitis     Gout synovitis     Hypertension     LBBB (left bundle branch block)     LBBB chronic    NICM (nonischemic cardiomyopathy) 08/2018    NICM, EF 20%    Respiratory failure 08/2018    Hypoxic/anoxic respiratory failure, extubated 08/20/2018    Type 2 diabetes mellitus, controlled     Viral cardiomyopathy 2008       Past Surgical History:   Procedure Laterality Date    COLONOSCOPY  2002    normal    EGD N/A 09/01/2018    Procedure: EGD;  Surgeon: Lilia Pro, MD;  Location: [facility];  Service: Gastroenterology;  Laterality: N/A;    EXPLORATORY LAPAROTOMY      EYE SURGERY      ICD IMPLANT BIV N/A 08/26/2018    Procedure: ICD IMPLANT BIV;  Surgeon: Len Childs, MD;  Location: [facility];  Service: Cardiovascular;  Laterality: N/A;  medtronic    INSERT / REPLACE / REMOVE PACEMAKER      Difibrillator August 26, 2018    LHC W/ CORONARY ANGIOS AND LV Left 08/21/2018    Procedure: LHC W/ CORONARY ANGIOS AND LV;  Surgeon: Burna Forts,  MD;  Location: [facility];  Service: Cardiovascular;  Laterality: Left;    SMALL INTESTINE SURGERY  November 2014       Allergies     Allergies   Allergen Reactions    Shellfish-Derived Products Anaphylaxis       Medications       Current Outpatient Medications:     apixaban (Eliquis) 2.5 MG, Take by mouth, Disp: , Rfl:     aspirin EC 81 MG EC tablet, Take 81 mg by mouth daily, Disp: , Rfl:     atorvastatin (LIPITOR) 40 MG tablet, TAKE 2 TABLETS AT BEDTIME, Disp: 180 tablet, Rfl: 3    carvedilol (Coreg) 12.5 MG tablet, Take 12.5 mg by mouth 2 (two) times daily with meals  , Disp: , Rfl:     Entresto 49-51 MG Tab per tablet, TAKE 1 TABLET TWICE A DAY, Disp: 180 tablet, Rfl: 3    glimepiride (AMARYL) 2 MG tablet, Take 2 mg by mouth daily, Disp: , Rfl:     Iron-Vitamin C (VITRON-C PO), Take by mouth daily  , Disp: , Rfl:     Jardiance 25 MG tablet, 25 mg every morning  , Disp: , Rfl:     metFORMIN  (GLUCOPHAGE) 500 MG tablet, Take 1 tablet (500 mg total) by mouth 2 (two) times daily Morning and night (Patient taking differently: Take 500 mg by mouth 2 (two) times daily with meals  ), Disp: , Rfl:     Multiple Vitamins-Minerals (CENTRUM PO), Take by mouth daily  , Disp: , Rfl:     vitamin B-12 (CYANOCOBALAMIN) 100 MCG tablet, Take 1,000 mcg by mouth daily  , Disp: , Rfl:     Family History   Problem Relation Age of Onset    Cancer Father 51        bladder    Diabetes Father     Heart attack Father        Social History     Socioeconomic History    Marital status: Widowed     Spouse name: Not on file    Number of children: Not on file    Years of education: Not on file    Highest education level: Not on file   Occupational History    Not on file   Tobacco Use    Smoking status: Never Smoker    Smokeless tobacco: Never Used   Vaping Use    Vaping Use: Never used   Substance and Sexual Activity    Alcohol use: Not Currently     Alcohol/week: 0.0 standard drinks    Drug use: No    Sexual activity: Not Currently   Other Topics Concern    Not on file   Social History Narrative    Not on file     Social Determinants of Health     Financial Resource Strain:     Difficulty of Paying Living Expenses:    Food Insecurity:     Worried About Programme researcher, broadcasting/film/video in the Last Year:     Barista in the Last Year:    Transportation Needs:     Freight forwarder (Medical):     Lack of Transportation (Non-Medical):    Physical Activity:     Days of Exercise per Week:     Minutes of Exercise per Session:    Stress:     Feeling of Stress :    Social Connections:  Frequency of Communication with Friends and Family:     Frequency of Social Gatherings with Friends and Family:     Attends Religious Services:     Active Member of Clubs or Organizations:     Attends Engineer, structural:     Marital Status:    Intimate Partner Violence:     Fear of Current or Ex-Partner:      Emotionally Abused:     Physically Abused:     Sexually Abused:        Review of Systems   Constitutional: Denies weight changes, fever, chills, weakness or night sweats.   Neurologic:  Denies headaches, dizziness, fainting, numbness, tingling, seizure, stroke or tremors.    HEENT:  Denies visual changes, hearing loss, difficulty swallowing, snoring or hoarseness.  Respiratory: Denies cough, wheezing, SOB or hemoptysis.  Cardiovascular: See HPI. Denies chest pain, cardiac murmur, irregular heartbeat, dyspnea, lower extremity swelling, lower extremity claudication.    Gastrointestinal:Denies abdominal pain, nausea, vomiting and diarrhea  Genitourinary: Denies for dysuria or hematuria.   Hematologic/Lymphatic:  Denies lymphadenopathy, bleeding disorders, easy bruising or anemia.   Endocrine:  See HPI. Denies thyroid disease, polydipsia, polyuria, heat or cold intolerance.    Musculoskeletal:  Denies joint pain, back pain, muscle pain or muscle weakness.   Skin: Denies skin lesions, rashes, hives or hair loss on legs.    Psychiatric:  Denies depression, anxiety or panic attacks.    All other systems were reviewed and are negative except what is stated in the HPI    Physical Exam     There were no vitals filed for this visit.    There is no height or weight on file to calculate BMI.    General:  Patient appears their stated age, well-nourished.  Alert and in no apparent distress.  HEENT:  Normpcephalic.  EOMI.    Neck:  No JVD.  Bilateral carotid bruits.   Lungs: Respiratory effort unlabored, chest expansion symmetric.  Cardiac: S1, S2. RRR, no murmurs, rubs or gallops.  Abd: Soft, nondistended, nontender.    Extremities:Full ROM, symmetric, warm, no ischemic changes, ulceration,  gangrene or edema.   Pulses: 2+ Bilateral femoral, popliteal, posterior tibial and dorsalis pedis pulses  Skin: Warm, no varicosities.    Neuro: Good insight and judgment, oriented to person, place, and time , gross motor and sensory intact.  Stable gait.       Imaging     Please see HPI.    Assessment and Plan   74 year old white female with progressing stenosis of the right internal carotid artery, which by velocity criteria is 70 to 99% stenotic, however prior MRA revealed the patient to have a 75% stenosis.  Typically carotid endarterectomy would be reserved for asymptomatic disease for 80% or more stenosis.  Since her peak systolic velocities and ICA to CCA ratio in the right carotid artery has increased, I am going to perform a repeat MRA this time with Feraheme, which will have no impact on her kidneys, and in light of the technical limitations of her prior MRA secondary to motion artifact, to assess the true degree of carotid stenosis.  If she has an 80% or more carotid stenosis on the right, I will offer her a right carotid endarterectomy.  She should continue her aspirin and statin therapy, for stroke prevention.    Thank you for the opportunity to care for your patient. Please do no hesitate to contact our office with any questions or concerns.  Sincerely,     Marcell Anger, M.D.    [facility]  [address]   [address]  [place], [place] [address]  T [phone]    I performed a face to face clinical assessment of the patient including history and physical exam with pertinent findings noted. Additional time was spent reviewing existing diagnostic studies and constructing a diagnostic and treatment plan. Greater than 50% of the time was spent face to face in coordination and counseling regarding implementation of the plan which was discussed in detail with the patient including risks and benefits. Based on the complexity of the case, 20 minutes were spent in this process.    This note was generated by the Epic EMR system/ Dragon speech recognition and may contain inherent errors or omissions not intended by the user. Grammatical errors, random word insertions, deletions, pronoun errors and incomplete sentences are  occasional consequences of this technology due to software limitations. Not all errors are caught or corrected. If there are questions or concerns about the content of this note or information contained within the body of this dictation they should be addressed directly with the author for clarification

## 2020-01-23 ENCOUNTER — Ambulatory Visit (INDEPENDENT_AMBULATORY_CARE_PROVIDER_SITE_OTHER): Payer: Medicare Other | Admitting: Cardiovascular Disease

## 2020-01-24 ENCOUNTER — Ambulatory Visit (INDEPENDENT_AMBULATORY_CARE_PROVIDER_SITE_OTHER): Payer: Medicare Other | Admitting: Specialist

## 2020-01-26 ENCOUNTER — Encounter (INDEPENDENT_AMBULATORY_CARE_PROVIDER_SITE_OTHER): Payer: Self-pay

## 2020-01-26 ENCOUNTER — Ambulatory Visit
Admission: RE | Admit: 2020-01-26 | Discharge: 2020-01-26 | Disposition: A | Discharge status: Home / Self Care | Payer: Medicare Other | Source: Ambulatory Visit | Attending: Specialist | Admitting: Specialist

## 2020-01-26 ENCOUNTER — Encounter (INDEPENDENT_AMBULATORY_CARE_PROVIDER_SITE_OTHER): Payer: Self-pay | Admitting: Physician Assistant

## 2020-01-26 DIAGNOSIS — I6521 Occlusion and stenosis of right carotid artery: Secondary | ICD-10-CM

## 2020-01-26 DIAGNOSIS — Z95810 Presence of automatic (implantable) cardiac defibrillator: Secondary | ICD-10-CM

## 2020-01-26 NOTE — Progress Notes (Signed)
Cardiovascular Implantable Electronic Devices (CIED) MRI Order Documentation    Date of Order: January 26, 2020  Patient Name: Tara Perry, Tara Perry  Date of Birth: 1946/01/16  Medical Record #:  [id number]    Account#:  [id number]    CIED Information  Manufacturer: Medtronic Model: Claria MRI Quad CRT-D   Indication for CIED: Primary Prevention   Date of last interrogation: 28 Sep 21   Location of CIED: Left Upper Chest    Full system MRI conditional: Yes  Battery life at EOL/ERI:  No  Leads implanted >6 weeks (all companies, except Abbott/St Jude who do not need 6 week wait):  Yes  Evidence of fractured/compromised leads: No  Pacing thresholds <2.5V @ <0.45ms: Yes     Pacing Mode for MRI: DOO    Pacing Rate for MRI: 70    MRI timeout (if applicable): N/A     Signed by:     Karis Juba PhD, DrPH, DMSc, PA-C   Cardiac Electrophysiology   [facility]

## 2020-01-26 NOTE — Progress Notes (Signed)
Patient sent remote transmit as scheduled on 12/09/2019. Battery life and Leads are stable. Additional information available by request.

## 2020-01-28 ENCOUNTER — Encounter (INDEPENDENT_AMBULATORY_CARE_PROVIDER_SITE_OTHER): Payer: Self-pay | Admitting: Cardiovascular Disease

## 2020-01-28 ENCOUNTER — Ambulatory Visit (INDEPENDENT_AMBULATORY_CARE_PROVIDER_SITE_OTHER): Payer: Medicare Other | Admitting: Cardiovascular Disease

## 2020-01-28 VITALS — BP 132/74 | HR 72 | Ht 64.0 in | Wt 136.0 lb

## 2020-01-28 DIAGNOSIS — E7800 Pure hypercholesterolemia, unspecified: Secondary | ICD-10-CM

## 2020-01-28 DIAGNOSIS — B3324 Viral cardiomyopathy: Secondary | ICD-10-CM

## 2020-01-28 DIAGNOSIS — I5042 Chronic combined systolic (congestive) and diastolic (congestive) heart failure: Secondary | ICD-10-CM

## 2020-01-28 MED ORDER — ENTRESTO 49-51 MG PO TABS
1.0000 | ORAL_TABLET | Freq: Two times a day (BID) | ORAL | 3 refills | Status: DC
Start: 2020-01-28 — End: 2021-10-14

## 2020-01-28 MED ORDER — ATORVASTATIN CALCIUM 40 MG PO TABS
80.0000 mg | ORAL_TABLET | Freq: Every evening | ORAL | 3 refills | Status: DC
Start: 2020-01-28 — End: 2021-03-18

## 2020-01-28 MED ORDER — APIXABAN 2.5 MG PO TABS
2.5000 mg | ORAL_TABLET | Freq: Two times a day (BID) | ORAL | 3 refills | Status: DC
Start: 2020-01-28 — End: 2021-01-27

## 2020-01-28 MED ORDER — CARVEDILOL 12.5 MG PO TABS
12.5000 mg | ORAL_TABLET | Freq: Two times a day (BID) | ORAL | 3 refills | Status: DC
Start: 2020-01-28 — End: 2021-01-27

## 2020-01-28 NOTE — Progress Notes (Signed)
[facility] OFF[facility] PROGRESS NOTE    [facility] OFFICE      [facility] OFFICE -CARDIOLOGY  [address]  [place] [address]  Dept: [phone]  Dept Fax: [phone]       Patient Name: Tara Perry    Date of Visit:  January 28, 2020  Date of Birth: 07-17-1945  AGE: 74 y.o.  Medical Record #: [id number]  Requesting Physician: Tammi Sou, MD      CHIEF COMPLAINT: Hypertension      HISTORY OF PRESENT ILLNESS:    She is a pleasant 74 y.o. female who presents today for follow-up today for her known nonischemic cardiomyopathy.  Since her last visit, she underwent carotid duplex study 12/15/2019 showing unchanged bilateral carotid ICA disease of 7099% in the right ICA and 50 to 69% left ICA. Prior MRA 06/16/2019: 75% right ICA, 40% left ICA.  Denies any shortness of breath, paroxysmal nocturnal dyspnea, orthopnea, or leg edema.  Walking 1-2 miles a day with no symptoms.      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 She has a past surgical history that includes Colonoscopy (2002); Exploratory laparotomy; LHC w/ Coronary Angios and LV (Left, 08/21/2018); ICD Implant BiV (N/A, 08/26/2018); EGD (N/A, 09/01/2018); Eye surgery; Small intestine surgery (November 2014); and Insert / replace / remove pacemaker.    ALLERGIES:   Allergies   Allergen Reactions    Shellfish-Derived Products Anaphylaxis       MEDICATIONS:   Current Outpatient  Medications   Medication Sig    apixaban (Eliquis) 2.5 MG Take by mouth    aspirin EC 81 MG EC tablet Take 81 mg by mouth daily    atorvastatin (LIPITOR) 40 MG tablet TAKE 2 TABLETS AT BEDTIME    carvedilol (Coreg) 12.5 MG tablet Take 12.5 mg by mouth 2 (two) times daily with meals       Entresto 49-51 MG Tab per tablet TAKE 1 TABLET TWICE A DAY    glimepiride (AMARYL) 2 MG tablet Take 2 mg by mouth daily    Iron-Vitamin C (VITRON-C PO) Take by mouth daily       Jardiance 25 MG tablet 25 mg every morning       metFORMIN (GLUCOPHAGE) 500 MG tablet Take 1 tablet (500 mg total) by mouth 2 (two) times daily Morning and night (Patient taking differently: Take 500 mg by mouth 2 (two) times daily with meals   )    Multiple Vitamins-Minerals (CENTRUM PO) Take by mouth daily       vitamin B-12 (CYANOCOBALAMIN) 100 MCG tablet Take 1,000 mcg by mouth daily           FAMILY HISTORY: family history includes Cancer (age of onset: 71) in her father; Diabetes in her father; Heart attack in her father.    SOCIAL HISTORY: She reports that she has never smoked. She has never used smokeless tobacco. She reports previous alcohol use. She reports that she does not use drugs.    PHYSICAL EXAMINATION    Visit Vitals  BP 132/74   Pulse 72   Ht 1.626 m (  5\' 4" )   Wt 61.7 kg (136 lb)   BMI 23.34 kg/m       General Appearance:  A well-appearing female in no acute distress.    Skin: Warm and dry to touch, no apparent skin lesions, or masses noted.  Head: Normocephalic, normal hair pattern, no masses or tenderness   Eyes: EOMS Intact, PERRL, conjunctivae and lids unremarkable.  ENT: Ears, Nose and throat reveal no gross abnormalities.  No pallor or cyanosis.  Dentition good.   Neck: JVP normal, no carotid bruit, thyroid not enlarged   Chest: Clear to auscultation bilaterally with good air movement and respiratory effort and no wheezes, rales, or rhonchi   Cardiovascular: Regular rhythm, S1 normal, S2 normal, No S3 or S4, Apical  impulse not displaced. No murmurs. No gallops or rubs detected   Abdomen: Soft, nontender, nondistended, with normoactive bowel sounds. No organomegaly.  No pulsatile masses, or bruits.   Extremities: Warm without edema. No clubbing, or cyanosis. All peripheral pulses are full and equal.   Neuro: Alert and oriented x3. No gross motor or sensory deficits noted, affect appropriate.        ECG: Not performed today    LABS:   No results found for: CBC  Lab Results   Component Value Date    AST 18 12/24/2019    ALT 14 12/24/2019     No results found for: LIPID  Lab Results   Component Value Date    HGBA1C 6.5 (H) 08/24/2018    BNP 139 (H) 08/16/2018    TSH 1.06 09/13/2012           IMPRESSION:   Ms. [name] is a 74 y.o. female with the following problems:     Chronic systolic heart failure, NYHA class II, presumed dry weight 128 pounds  a. Echocardiogram 08/19/2018: EF 20%, dyskinesis of the basal/mid anterior, anteroseptal, and inferoseptal LV, moderate hypokinesis of the basal inferior wall, normal RV function, down from 40% on October 2017 echo  b. Echocardiogram 11/05/2018: EF 40%   Nonischemic cardiomyopathy, s/p cardiac catheterization 08/21/2018 with no CAD  a. Prior intolerance to spironolactone/MRAs   Prior ventricular fibrillation arrest, witnessed, hospitalized 08/16/2018 through 08/27/2018, successfully resuscitated by EMS, intubated, underwent successful therapeutic hypothermia protocol then extubated, s/p biventricular AICD placed 08/26/2018   Prior left upper extremity DVT requiring hospitalization 08/30/2018 through 09/08/2018 with anticoagulation at that time complicated by hematemesis requiring endoscopy showing nonbleeding duodenal ulcer treated with epinephrine and cautery/clipping   Hypertension   Hypercholesterolemia, on statin therapy   Chronic left bundle branch block   Type 2 diabetes   Chronic kidney disease   Bilateral carotid artery disease  a. Carotid duplex study 03/08/2018: Bilateral 50 to  69% ICA disease  b. Carotid duplex 05/07/2019: 70-99% right ICA, 50 to 69% left ICA  c. Possible symptomatic TIA 05/19/2019 (left facial numbness leading to ER visit)  d. MRA 06/16/2019: 75% right ICA, 40% left ICA  e. Carotid duplex 12/15/2019: 70-99% right ICA, 50 to 69% left ICA      RECOMMENDATIONS:     Doing well.  She is due to meet with vascular surgery (Dr. Marcell Anger) to assess whether to proceed with carotid endarterectomy.   Continue current guideline directed heart failure therapy for her nonischemic cardiomyopathy.  No MRAs since she had a prior significant intolerance and does not want to resume it.  We will continue Entresto, carvedilol, Jardiance, atorvastatin, aspirin, and Eliquis.   Follow-up in 6 months time  No orders of the defined types were placed in this encounter.      No orders of the defined types were placed in this encounter.      SIGNED:    Donnie Mesa, MD          This note was generated by the Dragon speech recognition and may contain errors or omissions not intended by the user. Grammatical errors, random word insertions, deletions, pronoun errors, and incomplete sentences are occasional consequences of this technology due to software limitations. Not all errors are caught or corrected. If there are questions or concerns about the content of this note or information contained within the body of this dictation, they should be addressed directly with the author for clarification.

## 2020-02-02 ENCOUNTER — Encounter (INDEPENDENT_AMBULATORY_CARE_PROVIDER_SITE_OTHER): Payer: Self-pay

## 2020-02-02 ENCOUNTER — Telehealth (INDEPENDENT_AMBULATORY_CARE_PROVIDER_SITE_OTHER): Payer: Self-pay

## 2020-02-02 NOTE — Telephone Encounter (Signed)
LVM w/ patient regarding upcoming appointment with Dr.Bade on 11/29. Was calling to see when/where the patient was getting the MRA completed at prior to her appointment. Call back number [phone] given for call back.

## 2020-02-02 NOTE — Progress Notes (Signed)
Room:         Appt Date/Time: 02/09/2020 @ 12:45pm     74 y.o. female   PCP: Tammi Sou, MD  Referring Physician:    New Patient Follow-Up X VIDEO-MyChart  [phone]  [phone] Post-op   [place] [facility] Other Imaging X Called Pt to see where/when MRA was going to be completed      Chief Complaint/HPI: F/U to progressing stenosis of the right internal carotid artery-Review MRA results.  Date of Last Visit: 12/29/2019     Allergies:   Allergies   Allergen Reactions    Shellfish-Derived Products Anaphylaxis        Selected Medications:    Coumadin Xarelto Eliquis X  Pradaxa Lovenox Other thinner:    Plavix Aspirin X  Brillinta Effient  Pletal [place] Fish Oil   Insulin Metformin X  Other Diabetes X Glimepiride/Jardiance  Atorvastatin X  Rosuvastatin Other Chol:    MHx: CHF   Stroke / TIA / Seizures HTN X / HLD  Diabetes X    MI / CAD A-fib / Arrhythmia  COPD / Asthma / (other lung)   Kidney Disease X  Liver Disease DVT X / Coagulopathy   Wounds Spine / other MSK  Cancer            Smoker           Former Smoker     X     Never Smoker    Vital Signs this Visit Pulses  HT  BP R BP L Temp   Rad Ulnar Brach Fem Pop DP PT        R          WT  Pulse R Pulse L SpO2       L          Imaging results:        Plan of Care:

## 2020-02-03 NOTE — Telephone Encounter (Signed)
-  Called Pt and left message regarding VM left on nurse's line returning our call about MRA  -Pt stated on VM she was unable to complete MRA due to claustrophobia and would like to discuss how to proceed  -Advised Pt to call back to office to discuss at [phone] ext [phone]

## 2020-02-04 ENCOUNTER — Other Ambulatory Visit (INDEPENDENT_AMBULATORY_CARE_PROVIDER_SITE_OTHER): Payer: Self-pay | Admitting: Family

## 2020-02-04 DIAGNOSIS — F40240 Claustrophobia: Secondary | ICD-10-CM

## 2020-02-04 DIAGNOSIS — I6521 Occlusion and stenosis of right carotid artery: Secondary | ICD-10-CM

## 2020-02-04 MED ORDER — LORAZEPAM 1 MG PO TABS
1.0000 mg | ORAL_TABLET | Freq: Once | ORAL | 0 refills | Status: AC
Start: 2020-02-04 — End: 2020-02-04

## 2020-02-04 NOTE — Progress Notes (Signed)
Patient was unable to complete MRA due to Claustrophobia anxiety.  I have prescribed Ativan 1 mg x2 doses.  Patient advised to take 1mg  of ativan 1-2 hour before scheduled MRA appointment. May use the other 1mg  if still anxious after about 2 hours before going into MRA scanner  She's aware to have someone drive her to and from the  Appointment.

## 2020-02-04 NOTE — Telephone Encounter (Addendum)
Patient called back, returning VM left by RN yesterday.  - Called back and LVM on mobile and home numbers to call back x [phone]. Advised that we wanted to see if she might be willing to try to do MRA again after having taken anti-anxiety medication.

## 2020-02-04 NOTE — Telephone Encounter (Signed)
Pt returned call and is open to anti-anxiety med prior to MRA and confirmed pharmacy on file w/ message to NP to order.    Instructed will need to have driver to scan and will feel relaxed and less anxious for MRI due to claustrophobia.    Pt will call [facility] radiology Monday to reschedule due to Thanksgiving holiday and cancelled f/u video appt w/ Bade Monday 11/29.     She will call our office back when she is rescheduled for MRA.

## 2020-02-09 ENCOUNTER — Telehealth (INDEPENDENT_AMBULATORY_CARE_PROVIDER_SITE_OTHER): Payer: Medicare Other | Admitting: Specialist

## 2020-03-07 ENCOUNTER — Encounter (INDEPENDENT_AMBULATORY_CARE_PROVIDER_SITE_OTHER): Payer: Medicare Other | Admitting: Cardiovascular Disease

## 2020-03-07 DIAGNOSIS — Z95810 Presence of automatic (implantable) cardiac defibrillator: Secondary | ICD-10-CM

## 2020-03-11 ENCOUNTER — Encounter (INDEPENDENT_AMBULATORY_CARE_PROVIDER_SITE_OTHER): Payer: Self-pay

## 2020-03-11 NOTE — Progress Notes (Signed)
Patient sent remote transmit as scheduled on 12.30.2021. Battery life and Leads are stable. Additional information available by request.

## 2020-06-06 ENCOUNTER — Encounter (INDEPENDENT_AMBULATORY_CARE_PROVIDER_SITE_OTHER): Payer: Medicare Other | Admitting: Cardiovascular Disease

## 2020-06-06 DIAGNOSIS — Z95810 Presence of automatic (implantable) cardiac defibrillator: Secondary | ICD-10-CM

## 2020-06-09 ENCOUNTER — Other Ambulatory Visit (INDEPENDENT_AMBULATORY_CARE_PROVIDER_SITE_OTHER): Payer: Medicare Other | Admitting: Cardiovascular Disease

## 2020-06-15 NOTE — Progress Notes (Signed)
[facility] OFFICE  [address] [place], [place] [address]     Yvonne Kendall    Date of Visit:  02/27/2018  Date of Birth: 09/24/1945  Age: 75 yrs.   Medical Record Number: [id number]  __  CURRENT DIAGNOSES     1. Type 2 diabetes mellitus without  complications, E11.9  2. Essential (primary) hypertension, I10  3. Nonrheumatic mitral (valve) insufficiency, I34.0  4. Other cardiomyopathies, I42.8  5. Left bundle-branch block, unspecified, I44.7  6. CHF chronic systolic, I50.22   7. 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 Januvia 100 mg Tablet, 1 po qd  2.  Vitamin B-12 1,000 mcg Tablet, 1 po qd  3. Aspirin 81 mg Tablet, Chewable, 1 p.o. q.d.  4. Centrum Silver Tablet, 1 p.o. q.d.  5. atorvastatin 20 mg tablet, 1 po qd  6. glimepiride 2 mg tablet, 1 po qd  7. metformin 500 mg tablet,  2 po bid  8. Entresto 49 mg-51 mg tablet, 1 po bid  9. carvedilol 25 mg tablet, 1 po bid  __  CHIEF COMPLAINT/REASON FOR VISIT  Followup  of CHF chronic systolic and Followup of Other cardiomyopathies  __  HISTORY OF PRESENT ILLNESS  She is a delightful 75 year old female who returns  to the office today for follow-up. She is doing well from a cardiac standpoint and denies any chest pain, pressure, tightness, shortness of breath, dizziness, presyncope or syncope. She is having some left shoulder discomfort and plans to see Ortho.   __  PAST HISTORY     Past Medical Illnesses:  DM Type 2, hyperlipidemia, PE, fibromyalgia, Hypertension;  Past Cardiac Illnesses: LBBB, CHF, cardiomyopathy,  Palpitations, mitral valve regurgitation; Infectious Diseases: No previous history of significant infectious diseases.;  Surgical Procedures: chest xray; Trauma History: No previous history of significant trauma.;  Cardiology Procedures-Invasive: Cath (Right and Left) 5/08; Cardiology Procedures-Noninvasive: ECHO 5/08,  Thallium ST date?,  Echocardiogram September 2008, Echocardiogram May 2010, Event Monitor May 2010, Echocardiogram October 2012, Echo 07/31/12, Echocardiogram July 2017, Echocardiogram October 2017; Cardiac  Cath Results: 5/08- RV mean wedge press 8 w/o V wave, PA 25/15, RV 25/6, RA mean 6, CO 4.5, LV mod hypokin, EF 35-40%, normal coronary arteries; Left Ventricular  Ejection Fraction: LVEF of 40% documented via echocardiogram on 12/14/2015; Peripheral Vascular Procedures : carotid 07/30/12, Extremity Legs Duplex 02/24/11, Carotid NIVA December 2018  PHYSICAL EXAMINATION     Vital Signs:  Blood Pressure:  126/70 Sitting, Left arm, regular cuff  130/70 Sitting, Right arm,  regular cuff    Weight: 148.80 lbs.  Height: 72.00"   BMI: 20.18   Pulse: 84/min.        Constitutional: cooperative, alert, oriented, in no acute distress Skin: Warm and dry to touch, no apparent  skin lesions, or masses noted. Head: Normocephalic, normal hair pattern, no masses or tenderness  Eyes: conjunctivae and lids normal ENT: Ears, Nose and  throat reveal no gross abnormalities, No pallor or cyanosis Neck: carotid pulses are full and equal bilaterally,  no JVD, soft bruits bilaterally RL Chest: clear to auscultation bilaterally, no use of accessory muscles, normal respiratory effort  Cardiac: Regular rhythm, S1 normal, S2 normal, No S3 or S4, Apical impulse not displaced, 2/6 LSB HSM Abdomen : abdomen soft, non-tender Peripheral Pulses: 2+ radial pulses b/l  Extremities/Back: no edema present Psychiatric: normal  memory Neurological:  No gross motor or sensory deficits noted, affect appropriate, oriented to time,  person and place.   __    Medications added today by the physician:      IMPRESSIONS:   1. Non-ischemic cardiomyopathy, with LVEF  40% on most recent echo 12/14/2015   (up from 25-30% on 09/2015 echo). Currently on carvedilol and Entresto.   2. Chronic systolic CHF, stable NYHA class II symptoms. Euvolemic on exam.  3. Hypertension, controlled.  4.  Hypertriglyceridemia,  on statin. LDL 88 on May 2019 lipids.   5. Chronic left bundle branch block.   6. LHC 2008: normal coronary arteries.  7. Type 2 diabetes.  8. Renal insufficiency.  9. Bilateral carotid artery disease (50-69% by December 2018 study).     RECOMMENDATIONS:   1. Continue aspirin and statin for carotid disease.   2. Continue Entresto and carvedilol for cardiomyopathy.   3. Carotid duplex next week as scheduled.   4. Follow-up  in six months, or sooner for any concerns.     Jillyn Ledger, NP-C    cc: Arnoldo Lenis MD  ____________________________  Christianne Dolin   Return Visit with NP/PA 6 months

## 2020-06-15 NOTE — Progress Notes (Signed)
[facility] OFFICE  [address] [place], [place] [address]     Tara Perry    Date of Visit:  02/28/2017  Date of Birth: 09/19/1945  Age: 75 yrs.   Medical Record Number: [id number]  __  CURRENT DIAGNOSES     1. Type 2 diabetes mellitus without  complications, E11.9  2. Essential (primary) hypertension, I10  3. Nonrheumatic mitral (valve) insufficiency, I34.0  4. Other cardiomyopathies, I42.8  5. Left bundle-branch block, unspecified, I44.7  6. CHF chronic systolic, I50.22   7. 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 Aspirin 81 mg Tablet, Chewable, 1 p.o.  q.d.  2. atorvastatin 20 mg tablet, 1 po qd  3. carvedilol 25 mg tablet, 1 po bid  4. Centrum Silver Tablet, 1 p.o. q.d.  5. Entresto 49 mg-51 mg tablet, 1 po bid  6. glimepiride 2 mg tablet, 1 po qd  7. Januvia 100 mg Tablet,  1 po qd  8. metformin 500 mg tablet, 2 po bid  9. Research Study Drug -, 1 po q am DAPA HF Dapagliflozin 10mg  or placebo  10. Vitamin B-12 1,000 mcg Tablet, 1 po qd  __  CHIEF COMPLAINT/REASON  FOR VISIT  Followup of CHF chronic systolic  __  HISTORY OF PRESENT ILLNESS  Doing well. She  did have an episode in October where she had the acute onset of left-sided facial numbness associated with a headache. It did not go away, so she went to the emergency room. Head CT showed no acute findings and she was discharged. She has had no further  episodes since. She denies any shortness of breath, chest pain, paroxysmal nocturnal dyspnea, orthopnea or leg swelling.  __  PAST HISTORY      Past Medical Illnesses: DM Type 2, hyperlipidemia, PE, fibromyalgia, Hypertension;  Past Cardiac Illnesses : LBBB, CHF, cardiomyopathy, Palpitations, mitral valve regurgitation; Infectious Diseases: No previous history of significant infectious diseases.;  Surgical Procedures: chest xray; Trauma History: No previous history of significant trauma.;   Cardiology Procedures-Invasive: Cath (Right and Left) 5/08; Cardiology Procedures-Noninvasive: ECHO 5/08,  Thallium ST date?, Echocardiogram September 2008, Echocardiogram May 2010, Event Monitor May 2010, Echocardiogram October 2012, Echo 07/31/12, Echocardiogram July 2017, Echocardiogram October 2017; Cardiac  Cath Results: 5/08- RV mean wedge press 8 w/o V wave, PA 25/15, RV 25/6, RA mean 6, CO 4.5, LV mod hypokin, EF 35-40%, normal coronary arteries; Left Ventricular  Ejection Fraction: LVEF of 40% documented via echocardiogram on 12/14/2015; Peripheral Vascular Procedures : carotid 07/30/12, Extremity Legs Duplex 02/24/11  PHYSICAL EXAMINATION    Vital Signs :  Blood Pressure:  120/60 Sitting, Left arm, large cuff  110/60 Sitting, Right arm, large cuff     Weight: 146.00 lbs.  Height: 72.00"  BMI:  20   Pulse: 70/min. Apical Regular        Constitutional: Cooperative, alert and oriented,well developed, well nourished, in no acute distress. Skin:  Warm and dry to touch, no apparent skin lesions, or masses noted. Head: Normocephalic, normal hair pattern,  no masses or tenderness Eyes: conjunctivae and lids normal Neck : carotid pulses are full and equal bilaterally, no JVD, soft bruits bilaterally RL Chest: clear to auscultation  bilaterally, no use of accessory muscles, normal respiratory effort Cardiac: Regular rhythm, S1 normal, S2 normal, No S3 or S4, Apical impulse not displaced,  2/6 LSB HSM Abdomen: abdomen  soft, non-tender Peripheral Pulses : 2+ radial pulses b/l Extremities/Back: no edema present  Psychiatric: normal memory Neurological:  No gross motor or sensory deficits noted, affect appropriate, oriented to time, person and place.   __    Medications added today by the physician:       ECG:   ECG shows her old left bundle branch block.    IMPRESSIONS:   1. Non-ischemic cardiomyopathy,  with LVEF 40% on most recent echo 12/14/2015 (up from  25-30% on 09/2015 echo). Currently on carvedilol and Entresto.    2. Chronic systolic CHF, stable NYHA class II symptoms. Euvolemic on exam today.  3. Hypertension, controlled.  4.  Hypertriglyceridemia, on statin. 10/29/2013 TC 142, TG 134, HLD 54, LDL 61.  5. Chronic left bundle branch block.   6. LHC 2008: normal coronary arteries.  7. Type 2 diabetes.  8. Renal insufficiency  9. Currently enrolled in DAPA-HF  trial.  10. Bilateral carotid artery disease (50-69% by 2014 carotid duplex study)    RECOMMENDATIONS:   1. Repeat carotid duplex study to  reassess her carotid disease in light of her episode of facial  numbness back in October that may have been related to a TIA.  2. Continue Entresto and Coreg at the same doses.  3. Follow up in six months with lipids prior to that visit to  reassess her statin dose, particularly  in light of her prior carotid disease.    Timmothy Euler, MD, [facility]     Tid: [id number]     cc: Arnoldo Lenis MD    MG  ____________________________  TODAYS ORDERS  Lipid Panel 6 months  12 Lead ECG Today  Return Visit  15 MIN 6 months  Carotid Duplex - PVL First Available

## 2020-06-15 NOTE — Progress Notes (Signed)
[facility] OFFICE  [address] [place], [place] [address]     Yvonne Kendall    Date of Visit:  08/28/2017  Date of Birth: 1945/12/27  Age: 75 yrs.   Medical Record Number: [id number]  __  CURRENT DIAGNOSES     1. Type 2 diabetes mellitus without  complications, E11.9  2. Essential (primary) hypertension, I10  3. Nonrheumatic mitral (valve) insufficiency, I34.0  4. Other cardiomyopathies, I42.8  5. Left bundle-branch block, unspecified, I44.7  6. CHF chronic systolic, I50.22   7. 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 Aspirin 81 mg Tablet, Chewable, 1 p.o.  q.d.  2. atorvastatin 20 mg tablet, 1 po qd  3. carvedilol 25 mg tablet, 1 po bid  4. Centrum Silver Tablet, 1 p.o. q.d.  5. Entresto 49 mg-51 mg tablet, 1 po bid  6. glimepiride 2 mg tablet, 1 po qd  7. Januvia 100 mg Tablet,  1 po qd  8. metformin 500 mg tablet, 2 po bid  9. Research Study Drug -, 1 po q am DAPA HF Dapagliflozin 10mg  or placebo  10. Vitamin B-12 1,000 mcg Tablet, 1 po qd  __  CHIEF COMPLAINT/REASON  FOR VISIT  Followup of CHF chronic systolic  __  HISTORY OF PRESENT ILLNESS    Doing quite  well, denies paroxysmal nocturnal dyspnea orthopnea leg edema or shortness of breath.    __  PAST HISTORY      Past Medical Illnesses: DM Type 2, hyperlipidemia, PE, fibromyalgia, Hypertension;  Past Cardiac Illnesses : LBBB, CHF, cardiomyopathy, Palpitations, mitral valve regurgitation; Infectious Diseases: No previous history of significant infectious diseases.;  Surgical Procedures: chest xray; Trauma History: No previous history of significant trauma.;  Cardiology Procedures-Invasive: Cath (Right and Left) 5/08; Cardiology Procedures-Noninvasive: ECHO  5/08, Thallium ST date?, Echocardiogram September 2008, Echocardiogram May 2010, Event Monitor May 2010, Echocardiogram October 2012, Echo 07/31/12, Echocardiogram July 2017, Echocardiogram  October 2017;  Cardiac Cath Results: 5/08- RV mean wedge press 8 w/o V wave, PA 25/15, RV 25/6, RA mean 6, CO 4.5, LV mod hypokin, EF 35-40%, normal coronary arteries;  Left Ventricular Ejection Fraction: LVEF of 40% documented via echocardiogram on 12/14/2015; Peripheral Vascular Procedures : carotid 07/30/12, Extremity Legs Duplex 02/24/11, Carotid NIVA December 2018  PHYSICAL EXAMINATION     Vital Signs:  Blood Pressure:  106/64 Sitting, Right arm, large cuff    Weight:  144.20 lbs.  Height: 72.00"  BMI: 19    Pulse: 78/min. Apical Regular       Constitutional: Cooperative, alert and oriented,well developed,  well nourished, in no acute distress. Skin: Warm and dry to touch, no apparent skin lesions, or masses noted.  Head: Normocephalic, normal hair pattern, no masses or tenderness Eyes: conjunctivae and lids normal  Neck: carotid pulses are full and equal bilaterally, no JVD, soft bruits bilaterally RL Chest: clear  to auscultation bilaterally, no use of accessory muscles, normal respiratory effort Cardiac: Regular rhythm, S1 normal, S2 normal, No S3 or S4, Apical  impulse not displaced, 2/6 LSB HSM Abdomen: abdomen soft, non-tender Peripheral Pulses : 2+ radial pulses b/l Extremities/Back: no edema present Psychiatric : normal memory Neurological: No gross motor or sensory deficits noted, affect appropriate, oriented to time, person and place.   __     Medications added today by the physician:        IMPRESSIONS:  1. Non-ischemic cardiomyopathy, with LVEF 40% on most recent echo 12/14/2015 (up from  25-30% on 09/2015 echo). Currently  on carvedilol and Entresto.   2. Chronic systolic CHF, stable NYHA class II symptoms. Euvolemic on exam today.  3. Hypertension, controlled.  4. Hypertriglyceridemia, on statin.   5. Chronic left bundle branch block.   6. LHC 2008:  normal coronary arteries.  7. Type 2 diabetes.  8. Renal insufficiency  9. Currently enrolled in DAPA-HF trial.  10. Bilateral carotid artery disease  (50-69% by 2014 carotid duplex study)    RECOMMENDATIONS:   1. Doing quite  well, well compensated with regards to her heart failure. Continue current cardiac regimen.  2. She is currently enrolled in the DAPAHF trial through, cardiovascular outcomes trial of the sodium glucose co-transporter inhibitor in patients with cardiomyopathy,  through which she is randomized to either dapagliflozin or placebo. She should be completing the study in the next few months. Once she is off study drug, we will reassess her volume status and electrolytes a few weeks after transitioning out of the study  in case she needs her diuretics adjusted after coming off dapagliflozin or placebo (dapagliflozin has a mild diuretic effect).  3. Follow-up in 6 months time.    cc:   Arnoldo Lenis MD       ____________________________  Christianne Dolin  Return Visit with NP/PA 6 months  Return Visit 15 MIN 1 year        Timmothy Euler, MD,  [facility]

## 2020-06-15 NOTE — Progress Notes (Signed)
[facility] OFFICE   [address] [place], [place] [address]     TELEMEDICINE VISIT       Tara Perry, Tara Perry    Date of Visit:  10/11/2018  Date of Birth: [birth year]  Age: 75 yrs.   Medical Record Number: [id number]  __  CURRENT DIAGNOSES     1. Type 2 diabetes mellitus without  complications, E11.9  2. Essential (primary) hypertension, I10  3. Nonrheumatic mitral (valve) insufficiency, I34.0  4. Other cardiomyopathies, I42.8  5. Left bundle-branch block, unspecified, I44.7  6. Cardiac Arrest, Cause Unspecified,  I46.9  7. Ventricular Fibrillation, I49.01  8. CHF chronic systolic, I50.22  9. 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 Localized Edema, R60.0  __   ALLERGIES    Shellfish Derived, Intolerance-unknown  __   MEDICATIONS     1. Vitamin B-12 1,000 mcg Tablet, 1 po qd  2. Centrum Silver Tablet, 1 p.o. q.d.  3. metformin 500 mg tablet, 1 po bid  4. spironolactone 25 mg tablet, 1 po QD  5.  Tylenol 325 mg capsule, 2 po PRN pain  6. carvedilol 3.125 mg tablet, 1 po bid  7. Lovenox 60 mg/0.6 mL subcutaneous syringe, bid  8. pantoprazole 40 mg tablet,delayed release, 1 po bid  9. Entresto 24 mg-26 mg tablet, 1 po bid  10.  Jardiance 10 mg tablet, 1 po qd  __  CHIEF COMPLAINT/REASON FOR VISIT  Followup of CHF chronic systolic  __   HISTORY OF PRESENT ILLNESS  The visit today was conducted via telemedicine due to COVID-19 precautions. The patient was in their home and was informed and gave verbal consent to proceed.    [name] with history of chronic systolic heart failure with EF 20%, nonischemic cardiomyopathy, witnessed V. fib cardiac arrest, status post by V AICD August 26, 2018, hypertension, hyperlipidemia, chronic left bundle branch block, type 2 diabetes moderate  carotid artery disease is on [facility]ximity today for telehealth heart failure follow-up visit. Patient reports cardiac wise she is doing very well. Patient states that she is now  walking on her own about three quarters of a mile twice a day without any chest  pain, shortness of breath, palpitations or presyncope. Patient was denies any orthopnea, PND, weight gain, lower extremity edema. Patient is seeing her hematologist Dr. Zollie Pee and states that her hemoglobin has been dropping with the most recent value of  9. Patient states that she had repeat blood work done yesterday which I do not currently have results of.   __  PAST HISTORY      Past Medical Illnesses: DM Type 2, hyperlipidemia, PE, fibromyalgia, Hypertension;  Past Cardiac Illnesses : Cardiac Arrest june 2020; Infectious Diseases: No previous history of significant infectious diseases.;  Surgical Procedures: chest xray, ICD; Trauma History: No previous history of significant trauma.;  Cardiology Procedures-Invasive: Cath (Right and Left) 5/08, Defibrillator implant June 2020; Cardiology Procedures-Noninvasive : ECHO 5/08, Thallium ST date?, Echocardiogram September 2008, Echocardiogram May 2010, Event Monitor May 2010, Echocardiogram October 2012, Echo 07/31/12, Echocardiogram July 2017, Echocardiogram October 2017;  Cardiac Cath Results: 5/08- RV mean wedge press 8 w/o V wave, PA 25/15, RV 25/6, RA mean 6, CO 4.5, LV mod hypokin, EF 35-40%, normal coronary arteries;  Left Ventricular Ejection Fraction: LVEF of 40% documented via echocardiogram on 12/14/2015; Peripheral Vascular Procedures : carotid 07/30/12, Extremity Legs Duplex 02/24/11, Carotid NIVA December 2018  ___  FAMILY HISTORY  Father -- Heart Attack  MaternalGrandparent -- Heart Attack   Mother -- Heart disorder (MVP)    SOCIAL HISTORY     Alcohol Use: Rarely; Smoking: never smoked; Never smoker ([id number]);  Diet: Regular diet and Caffeine use-1-2 per day; Exercise: walking daily 21miles/d;   __   PHYSICAL EXAMINATION    Vital Signs:  Blood Pressure:   124/86 telehealth    Weight: 128.00 lbs.  Height:  72.00"  BMI: 17.36       Constitutional:  alert and oriented Skin: no  apparent skin lesions Head : normocephalic Eyes: conjunctivae and lids normal  ENT: No pallor or cyanosis Neck:  no JVD Chest: no use of accessory muscles Extremities/Back : No clubbing, cyanosis or edema Neurological: oriented to time, person and place   __     Medications added today by the physician:    IMPRESSIONS:   1. Chronic systolic heart failure, NYHA class II function, presumed dry wt 128lbs -  on guideline-directed medical therapy (Coreg, Entresto, spironolactone).   a. Echo August 19, 2018, showing severely depressed LV function, EF 20%, dyskinesis of the basal and mid anterior, anteroseptal and inferoseptal LV wall. Moderate hypokinesis  of the basal inferior wall. Normal RV function. Compared to study October 2017, EF has decreased from 40% to 21%. No significant valve disease.   2. Nonischemic cardiomyopathy - cardiac cath August 21, 2018, did not show any CAD.  3. H/o VFib cardiac  arrest witnessed, hospitalized August 16, 2018, through August 27, 2018, successfully resuscitated by EMS, intubated, underwent successful therapeutic hypothermia protocol and then extubated. Echo showed EF of 20%. LHC was negative for CAD.   4. S/p Bi-V  AICD placed on August 26, 2018, prior to discharge in 08/2018  5. h/o left upper extremity DVT requiring [facility] hospitalization between August 30, 2018, and September 08, 2018. The stay was complicated by hematemesis requiring EGD. Nonbleeding duodenal  ulcer was found and was treated with epi as well as cautery and clipping. - per pt however recent hgb dropping to 9. Repeat CBC done yesterday with heme. Pending result.   6. Hypertension - controlled.  7. Hypercholesterolemia - on statin therapy  with LDL 88 on Aug 09, 2018, lipids.  8. Chronic left bundle branch block.  9. Type 2 diabetes.  10. Renal insufficiency with most recent creatinine 1.2 on September 08, 2018.  11. Bilateral carotid artery disease - most recent carotid Doppler  March 08, 2018, showing mild progression of her  right carotid, 50% to 69% stenosis as well as left carotid 50% to 69% stenosis, normal antegrade flow in both vertebral arteries. Compared to 2018 study, mild progression. - on ASA and lipitor     RECOMMENDATIONS:  1. Continue current GDM T for heart failure. I have consider increasing Coreg for better heart rate control however will hold given decreasing hemoglobin per patient  2. Continue  to monitor daily blood pressure and weight and call with any signs of fluid retention  3. We will call her hematologist for a copy of her most recent blood work  4. Briefly discussed cardiac rehab however she would like to wait given the current  COVID-19 pandemic  5. Gradually increase cardiovascular activity  6. Follow-up in 1 month with Dr. Leanne Lovely per patient's request with echocardiogram and repeat lipid panel prior to that appointment  7. Will change pcp and add new heme to our  CC list.     Raven Furnas, FNP-BC    cc:  Tammi Sou MD  Tyrone Sage MD  ____________________________  TODAYS ORDERS  Echo,  Limited 3 weeks  Telehealth Visit 15 Min 1 month

## 2020-06-15 NOTE — Progress Notes (Signed)
[facility] OFFICE   [address] [place], [place] [address]     TELEMEDICINE VISIT       KANNA, DAFOE    Date of Visit:  08/30/2018  Date of Birth: 04-08-1945  Age: 75 yrs.   Medical Record Number: [id number]  __  CURRENT DIAGNOSES     1. Type 2 diabetes mellitus without  complications, E11.9  2. Essential (primary) hypertension, I10  3. Nonrheumatic mitral (valve) insufficiency, I34.0  4. Other cardiomyopathies, I42.8  5. Left bundle-branch block, unspecified, I44.7  6. CHF chronic systolic, I50.22   7. 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 Localized Edema, R60.0  9. 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 Januvia 100 mg Tablet, 1 po qd   2. Vitamin B-12 1,000 mcg Tablet, 1 po qd  3. Centrum Silver Tablet, 1 p.o. q.d.  4. atorvastatin 20 mg tablet, 1 po qd  5. Entresto 49 mg-51 mg tablet, 1 po bid  6. carvedilol 25 mg tablet, 1 po bid  7. metformin 500 mg tablet, 1  po bid  8. spironolactone 25 mg tablet, 1 po QD  9. Tylenol 325 mg capsule, 2 po PRN pain  __  CHIEF COMPLAINT/REASON FOR VISIT  Followup  of swollen hand s/p ICD implant 06/15  __  HISTORY OF PRESENT ILLNESS  The visit today was conducted via telemedicine due to COVID-19 precautions.  The patient was in their home and was informed and gave verbal consent to proceed.    Ms. Verley is a 75 year old with a complicated history. She had a biventricular defibrillator placed in the last couple weeks. She woke up this morning and her  left arm was swollen and uncomfortable. We saw the patient via telemedicine and could see her arm. It does look a bit tight the hand is quite swollen. Her son was able to palpate the pulse. She denies any shortness of breath  __   PAST HISTORY     Past Medical Illnesses: DM Type 2, hyperlipidemia, PE, fibromyalgia, Hypertension;   Past Cardiac Illnesses: Cardiac Arrest june 2020; Infectious Diseases : No previous history of  significant infectious diseases.; Surgical Procedures: chest xray, ICD; Trauma  History: No previous history of significant trauma.; Cardiology Procedures-Invasive: Cath (Right and Left)  5/08, Defibrillator implant June 2020; Cardiology Procedures-Noninvasive: ECHO 5/08, Thallium ST date?, Echocardiogram September 2008, Echocardiogram May  2010, Event Monitor May 2010, Echocardiogram October 2012, Echo 07/31/12, Echocardiogram July 2017, Echocardiogram October 2017; Cardiac Cath Results: 5/08-  RV mean wedge press 8 w/o V wave, PA 25/15, RV 25/6, RA mean 6, CO 4.5, LV mod hypokin, EF 35-40%, normal coronary arteries; Left Ventricular Ejection Fraction : LVEF of 40% documented via echocardiogram on 12/14/2015; Peripheral Vascular Procedures: carotid 07/30/12, Extremity Legs Duplex 02/24/11, Carotid NIVA  December 2018  ___  FAMILY HISTORY   Father -- Heart Attack  MaternalGrandparent -- Heart Attack   Mother -- Heart disorder (MVP)    __  CARDIAC RISK FACTORS      Tobacco Abuse: has never used tobacco; Family History of Heart Disease: positive;  Hyperlipidemia: positive; Hypertension: negative;   Diabetes Mellitus: positive; Prior History of Heart Disease: negative;  Obesity: positive; Sedentary Life Style:negative; Age :positive; Menopausal:biological menopause  __  SOCIAL HISTORY     Alcohol Use: Rarely; Smoking: never smoked; Never smoker ([id number]);  Diet: Regular diet and Caffeine use-1-2  per day; Exercise: walking daily 20miles/d;   __   PHYSICAL EXAMINATION    Vital Signs:  Blood Pressure:   120/61  120/61 per patient    BMI: 17.76  Temperature : 98.7   Pulse: 70/min. per partient        Constitutional: alert and oriented Head: normocephalic  ENT: No pallor or cyanosis Chest: no use of accessory  muscles Extremities/Back: LUE edematous from hand to elbow.   __    Medications added today by the physician:      IMPRESSIONS:   1. Left upper extremity edema: We will send her for a stat ultrasound to rule out a  DVT.  2. Status post ICD recently placed  3. Chronic systolic heart failure  4. Left bundle branch block    RECOMMENDATIONS:  The patient will go  to [facility] radiology for a stat upper extremity ultrasound to rule out a DVT.    Addendum: The patient went to [facility] radiology and had upper extremity Doppler done which revealed multiple DVTs in her upper extremity. These included the subclavian  vein, brachial vein, basilic vein and the cephalic vein. The patient will go to the emergency room now from [facility] radiology. This was discussed with Dr. Dimas Aguas.    This note was generated by the [facility] EMR system/Dragon speech recognition and  may contain errors or omissions not intended by the user. Grammatical errors, random word insertions, deletions, pronoun errors, and incomplete sentences are occasional consequences of this technology due to software limitations. Not all errors are caught  or corrected. If there are questions or concerns about the content of this note or information contained within the body of this dictation, they should be addressed directly with the author for clarification.    Arville Care, NP    cc: Arnoldo Lenis MD

## 2020-06-15 NOTE — Progress Notes (Signed)
[facility] OFFICE  [address] [place], [place] [address]     Tara Perry    Date of Visit:  10/10/2017  Date of Birth: 04-13-1945  Age: 75 yrs.   Medical Record Number: [id number]  __  CURRENT DIAGNOSES     1. Type 2 diabetes mellitus without  complications, E11.9  2. Essential (primary) hypertension, I10  3. Nonrheumatic mitral (valve) insufficiency, I34.0  4. Other cardiomyopathies, I42.8  5. Left bundle-branch block, unspecified, I44.7  6. CHF chronic systolic, I50.22   7. 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 Aspirin 81 mg Tablet, Chewable, 1 p.o.  q.d.  2. atorvastatin 20 mg tablet, 1 po qd  3. carvedilol 25 mg tablet, 1 po bid  4. Centrum Silver Tablet, 1 p.o. q.d.  5. Entresto 49 mg-51 mg tablet, 1 po bid  6. glimepiride 2 mg tablet, 1 po qd  7. Januvia 100 mg Tablet,  1 po qd  8. metformin 500 mg tablet, 2 po bid  9. Vitamin B-12 1,000 mcg Tablet, 1 po qd  __  CHIEF COMPLAINT/REASON FOR VISIT  Followup  of CHF chronic systolic  __  HISTORY OF PRESENT ILLNESS  Tara Perry is a delightful 75 year old female who returns to the office today for followup.  She completed the data HF study one month ago. She continues to be euvolemic with study drug discontinued. She denies any chest pain, pressure, tightness, shortness of breath, dizziness, presyncope or syncope. She has been having issues with cataracts  and will be meeting with a provider tomorrow to be discussing upcoming cataract surgery.  __  PAST HISTORY      Past Medical Illnesses: DM Type 2, hyperlipidemia, PE, fibromyalgia, Hypertension;  Past Cardiac Illnesses : LBBB, CHF, cardiomyopathy, Palpitations, mitral valve regurgitation; Infectious Diseases: No previous history of significant infectious diseases.;  Surgical Procedures: chest xray; Trauma History: No previous history of significant trauma.;  Cardiology Procedures-Invasive:  Cath (Right and Left) 5/08; Cardiology Procedures-Noninvasive: ECHO 5/08,  Thallium ST date?, Echocardiogram September 2008, Echocardiogram May 2010, Event Monitor May 2010, Echocardiogram October 2012, Echo 07/31/12, Echocardiogram July 2017, Echocardiogram October 2017; Cardiac  Cath Results: 5/08- RV mean wedge press 8 w/o V wave, PA 25/15, RV 25/6, RA mean 6, CO 4.5, LV mod hypokin, EF 35-40%, normal coronary arteries; Left Ventricular  Ejection Fraction: LVEF of 40% documented via echocardiogram on 12/14/2015; Peripheral Vascular Procedures : carotid 07/30/12, Extremity Legs Duplex 02/24/11, Carotid NIVA December 2018  PHYSICAL EXAMINATION     Vital Signs:  Blood Pressure:  146/78 Sitting, Right arm, regular cuff  144/78 Sitting, Left arm,  regular cuff    Weight: 143.00 lbs.  Height: 72.00"   BMI: 19.39   Pulse: 80/min.        Constitutional: cooperative, alert, oriented, in no acute distress Skin: Warm and dry to touch, no apparent  skin lesions, or masses noted. Head: Normocephalic, normal hair pattern, no masses or tenderness  Eyes: conjunctivae and lids normal ENT: Ears, Nose and  throat reveal no gross abnormalities, No pallor or cyanosis Neck: carotid pulses are full and equal bilaterally,  no JVD, soft bruits bilaterally RL Chest: clear to auscultation bilaterally, no use of accessory muscles, normal respiratory effort  Cardiac: Regular rhythm, S1 normal, S2 normal, No S3 or S4, Apical impulse not displaced, 2/6 LSB HSM Abdomen : abdomen soft, non-tender Peripheral  Pulses: 2+ radial pulses b/l  Extremities/Back: no edema present Psychiatric: normal  memory Neurological: No gross motor or sensory deficits noted, affect appropriate, oriented to time,  person and place.   __    Medications added today by the physician:      IMPRESSIONS:    1. Non-ischemic cardiomyopathy, with LVEF 40% on most recent echo 12/14/2015 (up from 25-  30% on 09/2015 echo). Currently on carvedilol and Entresto.   2. Chronic  systolic CHF, stable NYHA class II symptoms. Euvolemic on exam.  3.  Hypertension, controlled.  4. Hypertriglyceridemia, on statin.   5. Chronic left bundle branch block.   6. LHC 2008: normal coronary arteries.  7. Type 2 diabetes.  8. Renal insufficiency.  9. Bilateral carotid artery disease (50-69%  by December 2018 study).  10. Preoperative cardiac evaluation for cataract surgery, not yet scheduled.     RECOMMENDATIONS:   1. Continue aspirin  and statin for carotid disease.   2. Continue Entresto and carvedilol for cardiomyopathy.   3. The patients preoperative cardiac risk was reviewed with Dr. Nash Shearer. The patient is at a low   cardiac risk for a low risk surgery.   4. Follow-up  in December as scheduled, or sooner for any concerns.     Florence Canner. Steva Colder, NP-C     Tid: [id number]:[id number]    cc: Arnoldo Lenis MD

## 2020-06-15 NOTE — Progress Notes (Signed)
[facility] OFFICE  [address] [place], [place] [address]     Tara Perry    Date of Visit:  12/24/2015  Date of Birth: [birth year]  Age: 75 yrs.   Medical Record Number: [id number]  __  CURRENT DIAGNOSES     1. Type 2 diabetes mellitus without  complications, E11.9  2. Essential (primary) hypertension, I10  3. Nonrheumatic mitral (valve) insufficiency, I34.0  4. Other cardiomyopathies, I42.8  5. Left bundle-branch block, unspecified, I44.7  6. CHF chronic systolic, I50.22   __  ALLERGIES    Shellfish Derived, Intolerance-unknown   __  MEDICATIONS     1. Januvia 100 mg Tablet, 1 po qd  2. Vitamin B-12 1,000 mcg Tablet, 1 po qd  3. Aspirin 81 mg Tablet, Chewable, 1  p.o. q.d.  4. Centrum Silver Tablet, 1 p.o. q.d.  5. carvedilol 25 mg tablet, 1 po bid  6. atorvastatin 20 mg tablet, 1 po qd  7. glimepiride 2 mg tablet, 1 po qd  8. metformin 500 mg tablet, 2 po bid  9. sacubitril 24 mg-valsartan  26 mg tablet, 1 tablet PO b.i.d.  __  CHIEF COMPLAINT/REASON FOR VISIT  Followup of CHF chronic systolic  __   HISTORY OF PRESENT ILLNESS  Ms. Zalenski is doing much better. She was started on Entresto in July. She had a remarkable improvement in terms of her shortness of breath. She recently stated that she is able  to carry her heavy dog up a flight of stairs without any problems, which was markedly different for her. She denies any paroxysmal nocturnal dyspnea or orthopnea. Her recent echocardiogram on December 14, 2015, showed significant improvement in her left  ventricular function with an ejection fraction that is now up to 40%. Previously had been 25% to 30% by echocardiography on September 13, 2015. She has stable class I/class II symptoms. Blood pressure has been stable.  __   PAST HISTORY     Past Medical Illnesses: DM Type 2, hyperlipidemia, PE, fibromyalgia, Hypertension;   Past Cardiac Illnesses: LBBB, CHF, cardiomyopathy, Palpitations, mitral valve regurgitation; Infectious  Diseases: No previous history  of significant infectious diseases.; Surgical Procedures: chest xray;  Trauma History: No previous history of significant trauma.; Cardiology Procedures-Invasive: Cath (Right  and Left) 5/08; Cardiology Procedures-Noninvasive: ECHO 5/08, Thallium ST date?, Echocardiogram September 2008, Echocardiogram May 2010, Event Monitor  May 2010, Echocardiogram October 2012, Echo 07/31/12, Echocardiogram July 2017, Echocardiogram October 2017; Cardiac Cath Results: 5/08- RV mean wedge press  8 w/o V wave, PA 25/15, RV 25/6, RA mean 6, CO 4.5, LV mod hypokin, EF 35-40%, normal coronary arteries; Left Ventricular Ejection Fraction: LVEF of 40%  documented via echocardiogram on 12/14/2015; Peripheral Vascular Procedures: carotid 07/30/12, Extremity Legs Duplex 02/24/11   PHYSICAL EXAMINATION    Vital Signs:  Blood Pressure:   130/70 Sitting, Left arm, regular cuff  128/70 Sitting, Right arm, regular cuff    Weight: 147.40 lbs.   Height: 64"  BMI: 25   Pulse:  82/min. Apical Regular       Constitutional: Cooperative, alert and oriented,well developed,  well nourished, in no acute distress. Skin: Warm and dry to touch, no apparent skin lesions, or masses noted.  Head: Normocephalic, normal hair pattern, no masses or tenderness Eyes : conjunctivae and lids normal Neck: carotid pulses are full and equal bilaterally, no JVD, soft bruits  bilaterally RL Chest: clear to auscultation bilaterally, no use of accessory muscles, normal respiratory effort  Cardiac: Regular rhythm,  S1 normal, S2 normal, No S3 or S4, Apical impulse not displaced, 2/6 LSB HSM Abdomen : Abdomen soft, bowel sounds normoactive, no masses, no hepatosplenomegaly, non-tender, no bruits Peripheral Pulses:  2+ radial and DP pulses b/l Extremities/Back: no cyanosis present, no clubbing present, no edema present  Neurological: No gross motor or sensory deficits noted, affect appropriate, oriented to time, person and place.   __     Medications added today by the  physician:      IMPRESSIONS:   1. Nonischemic cardiomyopathy.   a. Prior symptomatic decompensation with worsening class II symptoms July  2017 associated with worsening left ventricular dilatation and ejection fraction  of 25% to 30% by September 13, 2015, echo leading to initiation of Entresto July 2017.   b. Significant improvement in symptoms in left ventricular function on Entresto,  with ejection fraction 40% by December 14, 2015, echo and improved exertional  tolerance.  2. Hypertension.  3. Hypertriglyceridemia.  4. Left bundle branch  block. Left heart catheterization in 2008 with normal coronary arteries.  5. Type 2 diabetes.    RECOMMENDATIONS:   1. She is doing much s  on Entresto. Her ejection fraction improved from 25% to 30% to 40%.  Her symptoms have improved. She is able to carry her heavy dog above up a flight of stairs.  She has stable class II symptoms, but they are much better now.  2. Continue  Entresto at low dose 24/26. While her blood pressure is better today, it has been  running around 100 systolic. If she continues to have much higher blood pressures down the  road, we could consider uptitrating Entresto in the future, but certainly  not now.  3. She is interested in enrolling in the DAPA- HF study. She has been on a stable heart failure  regimen now for over three months and so we will see if we can enroll her.  4. Follow up in our heart failure clinic in three months'  for continued readjustment of her heart  failure regimen. I will see her back in six months' time.    Timmothy Euler, MD, [facility]     Tid: [id number]:WG     cc: Arnoldo Lenis MD    eca   ____________________________  TODAYS ORDERS  Diet_mgmt_edu,_guidance_and_counseling TODAY  Return Visit  15 MIN 6 months  Consider DAPA HF Today  Heart Failure Clinic Visit with MM 3 months

## 2020-06-15 NOTE — Progress Notes (Signed)
[facility] OFFICE   [address] [place], [place] [address]     TELEMEDICINE VISIT       Tara Perry, Tara Perry    Date of Visit:  11/21/2018  Date of Birth: 1945/10/16  Age: 75 yrs.   Medical Record Number: [id number]  __  CURRENT DIAGNOSES     1. Essential (primary) hypertension,  I10  2. Nonrheumatic mitral (valve) insufficiency, I34.0  3. Other cardiomyopathies, I42.8  4. Left bundle-branch block, unspecified, I44.7  5. Cardiac Arrest, Cause Unspecified, I46.9  6. Ventricular Fibrillation, I49.01  7. CHF  chronic systolic, I50.22  8. 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 Type 2 diabetes mellitus without complications, E11.9  10. 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 Hypercholesterolemia unspecified, E78.00  12. Localized  Edema, R60.0  __  ALLERGIES    Shellfish Derived, Intolerance-unknown   __  MEDICATIONS     1. Vitamin B-12 1,000 mcg Tablet, 1 po qd  2. Centrum Silver Tablet, 1 p.o. q.d.  3. spironolactone 25 mg tablet, 1  po QD  4. Tylenol 325 mg capsule, 2 po PRN pain  5. Jardiance 10 mg tablet, 1 po qd  6. atorvastatin 40 mg tablet, take 2 tabs po qhs  7. Eliquis 5 mg tablet, 1 po bid  8. metformin ER 1,000 mg tablet,extended release 24hr, 1 po bid   9. Entresto 49 mg-51 mg tablet, 1 po bid  10. carvedilol 6.25 mg tablet, 1 po bid  __  CHIEF COMPLAINT/REASON FOR VISIT  Followup of CHF chronic  systolic  __  HISTORY OF PRESENT ILLNESS  The visit today was conducted via telemedicine due to COVID-19 precautions. The patient was in their  home and was informed and gave verbal consent to proceed. Since her last visit, she is doing better. She is walking 1 mile twice a day with minimal exertional dyspnea. She denies PND, orthopnea, or worsening leg edema. Was started on the SGLT2 inhibitor  Jardiance by her PCP. Recent echocardiogram 11/05/2018 showed improvement in her left ventricular function with ejection fraction that increased to 40%.    __  PAST HISTORY      Past Medical  Illnesses: DM Type 2, hyperlipidemia, PE, fibromyalgia, Hypertension;  Past Cardiac Illnesses : Cardiac Arrest june 2020; Infectious Diseases: No previous history of significant infectious diseases.;  Surgical Procedures: chest xray, ICD; Trauma History: No previous history of significant trauma.;  Cardiology Procedures-Invasive: Cath (Right and Left) 5/08, Defibrillator implant June 2020; Cardiology Procedures-Noninvasive : ECHO 5/08, Thallium ST date?, Echocardiogram September 2008, Echocardiogram May 2010, Event Monitor May 2010, Echocardiogram October 2012, Echo 07/31/12, Echocardiogram July 2017, Echocardiogram October 2017, Echocardiogram August 2020;  Cardiac Cath Results: 5/08- RV mean wedge press 8 w/o V wave, PA 25/15, RV 25/6, RA mean 6, CO 4.5, LV mod hypokin, EF 35-40%, normal coronary arteries;  Left Ventricular Ejection Fraction: LVEF of 40% documented via echocardiogram on 11/05/2018; Peripheral Vascular Procedures : carotid 07/30/12, Extremity Legs Duplex 02/24/11, Carotid NIVA December 2018  PHYSICAL EXAMINATION     Vital Signs:  Blood Pressure:  123/80    Weight:  126.20 lbs.  Height: 72.00"  BMI: 17.11    Pulse: 99/min.     Constitutional: cooperative, alert, in no acute distress   Skin: non-jaundiced, no pallor, no apparent skin lesion, or masses noted. Head: Normocephalic, atraumatic  Eyes: EOMS Intact, conjunctivae and lids normal. ENT: No pallor or cyanosis. Dentition good.  Neck: no JVD Chest: no use of accessory  muscles, normal respiratory effort, not tachypneic.  Abdomen: Non-distended Neurological: No gross motor or sensory deficits noted.  Psych: affect appropriate, oriented to time, person and place. Extremities: No pitting edema or cyanosis  of the bilateral lower extremities.     __    Medications added today by the physician:  carvedilol 6.25 mg tablet, 1 po bid, 180  Entresto 49 mg-51 mg tablet, 1 po bid, 180      IMPRESSIONS:   1. Chronic systolic heart  failure, NYHA class II  function, presumed dry wt 128lbs - on guideline-directed medical therapy (Coreg, Entresto, spironolactone).   a. Echo August 19, 2018, showing severely depressed LV function, EF 20%, dyskinesis of the basal and mid anterior, anteroseptal  and inferoseptal LV wall. Moderate hypokinesis of the basal inferior wall. Normal RV function. Compared to study October 2017, EF has decreased from 40% to 21%. No significant valve disease.   b. Echocardiogram 11/05/2018: Improvement in ejection fraction  to 40%.  2. Nonischemic cardiomyopathy - cardiac cath August 21, 2018, no CAD  3. H/o VFib cardiac arrest witnessed, hospitalized August 16, 2018, through August 27, 2018, successfully resuscitated by EMS, intubated, underwent successful therapeutic  hypothermia protocol and then extubated. Echo showed EF of 20%. LHC was negative for CAD.   4. S/p Bi-V AICD placed on August 26, 2018, prior to discharge in 08/2018  5. Prior left upper extremity DVT requiring [facility] hospitalization between  August 30, 2018, and September 08, 2018, with anticoagulation at that time complicated by hematemesis requiring EGD. Nonbleeding duodenal ulcer was found and was treated with epi as well as cautery and clipping. -   6. Hypertension - controlled.  7.  Hypercholesterolemia - on statin therapy with LDL 88 on Aug 09, 2018, lipids.  8. Chronic left bundle branch block.  9. Type 2 diabetes.  10. Renal insufficiency with most recent creatinine 1.2 on September 08, 2018.  11. Bilateral carotid  artery disease - most recent carotid Doppler March 08, 2018, showing mild progression of her right carotid, 50% to 69% stenosis as well as left carotid 50% to 69% stenosis, normal antegrade flow in both vertebral arteries. Compared to 2018 study, mild  progression. - on ASA and lipitor    RECOMMENDATIONS:  1. Increase carvedilol from 3.125mg  PO BID to 6.25mg  PO BID (heart rate in the ambulatory setting has been in the 80s90s)  2. Increase Entresto from 24/26 to 49/51 twice daily  as this  is a more effective dose based on prior trials.  3. Continue spironolactone  4. Agree with initiation of an SGLT2 inhibitor given her history of cardiomyopathy, continue Jardiance.  5. Follow-up in 3 months time in our heart failure clinic,  she appears euvolemic today on exam, no change to the rest of her cardiomyopathy regimen.      Timmothy Euler, MD, [facility]      cc: Tammi Sou MD    ____________________________  TODAYS ORDERS  Heart Failure Clinic  Visit 3 months  Return Visit 15 MIN 6 months

## 2020-06-15 NOTE — Progress Notes (Signed)
Tara Perry    Date of Visit:  03/31/2016  Date of Birth: 13-Apr-1945  Age: 75 yrs.   Medical Record Number: [id number]  __  CURRENT DIAGNOSES     1. Type 2 diabetes mellitus without  complications, E11.9  2. Essential (primary) hypertension, I10  3. Nonrheumatic mitral (valve) insufficiency, I34.0  4. Left bundle-branch block, unspecified, I44.7  5. CHF chronic systolic, I50.22  6. Other cardiomyopathies, I42.8   __  ALLERGIES    Shellfish Derived, Intolerance-unknown   __  MEDICATIONS     1. Januvia 100 mg Tablet, 1 po qd  2. Vitamin B-12 1,000 mcg Tablet, 1 po qd  3. Aspirin 81 mg Tablet, Chewable, 1  p.o. q.d.  4. Centrum Silver Tablet, 1 p.o. q.d.  5. carvedilol 25 mg tablet, 1 po bid  6. atorvastatin 20 mg tablet, 1 po qd  7. glimepiride 2 mg tablet, 1 po qd  8. metformin 500 mg tablet, 2 po bid  9. sacubitril 24 mg-valsartan  26 mg tablet, 1 tablet PO b.i.d.  10. Research Study Drug -, 1 po q am DAPA HF Dapagliflozin 10mg  or placebo  __  CHIEF COMPLAINT/REASON FOR VISIT   Followup of CHF chronic systolic and Followup of Other cardiomyopathies  __  HISTORY OF PRESENT ILLNESS  Tara Perry is a 75 year old female who  presents for followup. She has a history of a nonischemic cardiomyopathy and was last seen by Dr. Nash Perry on December 24, 2015. She is currently on Entresto with an improved ejection fraction from 25% to 30% to 40% by most recent echocardiogram in October  2017. She was doing well at her last office visit. No medication changes were made. She is participating in the DAPA-HF study. Her systolic blood pressure had been running low prior to her last office visit, with systolic readings in the low 100s, so  her Entresto was not titrated. She is not checking blood pressure at home. She feels well today. She denies chest pain, palpitations, dyspnea, orthopnea, PND, dizziness, presyncope or syncope. She does not exercise, but performs all of her ADLs and basic  housework. She continues to be  able to carry her heavy dog up a flight of stairs without any major issues.    I spoke with the research department who stated her systolic blood pressures had been in the 120s one month ago. She had repeat blood  work at that time, which showed an elevated creatinine of 1.7. This had increased from a previous creatinine of 1.45 in November 2017.  __  PAST HISTORY      Past Medical Illnesses: DM Type 2, hyperlipidemia, PE, fibromyalgia, Hypertension;  Past Cardiac Illnesses : LBBB, CHF, cardiomyopathy, Palpitations, mitral valve regurgitation; Infectious Diseases: No previous history of significant infectious diseases.;  Surgical Procedures: chest xray; Trauma History: No previous history of significant trauma.;  Cardiology Procedures-Invasive: Cath (Right and Left) 5/08; Cardiology Procedures-Noninvasive: ECHO 5/08,  Thallium ST date?, Echocardiogram September 2008, Echocardiogram May 2010, Event Monitor May 2010, Echocardiogram October 2012, Echo 07/31/12, Echocardiogram July 2017, Echocardiogram October 2017; Cardiac  Cath Results: 5/08- RV mean wedge press 8 w/o V wave, PA 25/15, RV 25/6, RA mean 6, CO 4.5, LV mod hypokin, EF 35-40%, normal coronary arteries; Left Ventricular  Ejection Fraction: LVEF of 40% documented via echocardiogram on 12/14/2015; Peripheral Vascular Procedures : carotid 07/30/12, Extremity Legs Duplex 02/24/11  ___  FAMILY HISTORY  Father --  Heart Attack  MaternalGrandparent -- Heart Attack  Mother --  Heart  disorder (MVP)    __  CARDIAC RISK FACTORS     Tobacco Abuse: has never used tobacco;  Family History of Heart Disease: positive; Hyperlipidemia: positive;  Hypertension: negative;  Diabetes Mellitus: positive;  Prior History of Heart Disease: negative; Obesity: positive;  Sedentary Life Style:negative; ZOX:[id number]; Menopausal :biological menopause  __  SOCIAL HISTORY     Alcohol Use: Rarely; Smoking: never smoked; Never smoker ([id number]);  Diet: Regular diet and Caffeine use-1-2 per  day; Exercise: walking daily 76miles/d;   __   PHYSICAL EXAMINATION    Vital Signs:  Blood Pressure:  148/82 Sitting, Left arm, regular  cuff  146/77 Sitting, Right arm, regular cuff  118/62 Retaken by NP    Weight: 148.60 lbs.   Height: 72"  BMI: 20   Pulse:  72/min.       Constitutional: Cooperative, alert and oriented,well developed, well nourished, in no acute distress.  Skin: Warm and dry to touch, no apparent skin lesions, or masses noted. Head: Normocephalic, normal  hair pattern, no masses or tenderness Eyes: conjunctivae and lids normal Neck : carotid pulses are full and equal bilaterally, no JVD, soft bruits bilaterally RL Chest: clear to auscultation bilaterally, no use of accessory muscles,  normal respiratory effort Cardiac: Regular rhythm, S1 normal, S2 normal, No S3 or S4, Apical impulse not displaced, 2/6 LSB HSM  Abdomen: abdomen soft, non-tender Peripheral Pulses: 2+ radial pulses b/l  Extremities/Back: no edema present Psychiatric: normal memory  Neurological: No gross motor or sensory deficits noted, affect appropriate, oriented to time, person and place.   __    Medications added today  by the physician:      IMPRESSIONS:   1. Non-ischemic cardiomyopathy, with LVEF 40% on most recent echo 12/14/2015 (up from 25-30%   on 09/2015 echo). Currently on carvedilol and Entresto.    2. Chronic systolic CHF, stable NYHA class II symptoms. Euvolemic on exam today.  3. Hypertension, controlled.  4. Hypertriglyceridemia, on statin. 10/29/2013 TC 142, TG 134, HLD 54, LDL 61.  5. Chronic left bundle branch block.   6. LHC  2008: normal coronary arteries.  7. Type 2 diabetes.  8. Renal insufficiency, with elevated creatinine of 1.7 in 02/2016.  9. Currently enrolled in DAPA-HF trial.    RECOMMENDATIONS:   1. Continue low-dose Entresto, but would aim  to increase her to the intermediate dose if her   creatinine level today is improved.  2. We will get a comprehensive metabolic panel today as part of the DAPA-HF  study. (I will contact   the patient after results of creatinine to determine  whether we can increase her Entresto).   3. Continue current cardiac medications as prescribed.  4. Followup office visit with Dr. Nash Perry on Aug 10, 2016, as scheduled, sooner as needed (the patient   will check if Dr. Nash Perry has an earlier  appointment in April, which will be six months from her last   office visit with him in October 2017.    Anthonymichael Munday L. Montana Bryngelson, ANP-BC, AGACNP-BC     Tid: [id number]:HQ    cc: Arnoldo Lenis MD    eca

## 2020-06-15 NOTE — Progress Notes (Signed)
[facility]   [address] [place], [place] [address]     TELEMEDICINE VISIT       Tara Perry, Tara Perry    Date of Visit:  09/11/2018  Date of Birth: 01/19/1946  Age: 75 yrs.   Medical Record Number: [id number]  __  CURRENT DIAGNOSES     1. Type 2 diabetes mellitus without  complications, E11.9  2. Essential (primary) hypertension, I10  3. Nonrheumatic mitral (valve) insufficiency, I34.0  4. Other cardiomyopathies, I42.8  5. Left bundle-branch block, unspecified, I44.7  6. Cardiac Arrest, Cause Unspecified,  I46.9  7. Ventricular Fibrillation, I49.01  8. CHF chronic systolic, I50.22  9. 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 Januvia 100 mg Tablet, 1 po qd   2. Vitamin B-12 1,000 mcg Tablet, 1 po qd  3. Centrum Silver Tablet, 1 p.o. q.d.  4. metformin 500 mg tablet, 1 po bid  5. spironolactone 25 mg tablet, 1 po QD  6. Tylenol 325 mg capsule, 2 po PRN pain  7. carvedilol 3.125 mg tablet,  1 po bid  8. Lovenox 60 mg/0.6 mL subcutaneous syringe, bid  9. pantoprazole 40 mg tablet,delayed release, 1 po bid  10. Entresto 24 mg-26 mg tablet, 1 po bid  __  CHIEF COMPLAINT/REASON  FOR VISIT  hospital fu  __  HISTORY OF PRESENT ILLNESS  The visit today was conducted via telemedicine  due to COVID-19 precautions. The patient was in their home and was informed and gave verbal consent to proceed. Tara Perry with history of chronic systolic heart failure, left bundle branch block, history of VFib cardiac arrest, status post bi-V AICD,  nonischemic cardiomyopathy, hypertension, hypertriglyceridemia, diabetes and venous insufficiency, is on FaceTime today for hospital followup telehealth visit. The patient was seen on August 30, 2018, by [facility] for hospital followup; however, patient complained  of left upper extremity edema, so she was ordered to undergo a stat ultrasound, and it came back  positive for subclavian, brachial, basilic and cephalic vein DVT. The patient was advised to go straight to [facility] for treatment. The patient  was admitted at [facility] on August 30, 2018, to September 08, 2018, for her left upper extremity DVT. The patient was started on Lovenox; however, her stay was complicated by hematemesis with RT due to hypotension. The patient underwent EGD with GI and  was found to have nonbleeding duodenal ulcer, which was treated with epi as well as cautery clipping. The patient's blood pressure eventually improved and she was restarted on her Coreg, Entresto and Aldactone at a lower dose on discharge. The patient  was also discharged on Lovenox therapeutic dose b.i.d. and she has a pending followup with hematology in a couple of weeks and hopes to transition that over to Eliquis. The patient reports since being discharged, she actually has been feeling very well.  The patient denies any chest pain, shortness of breath, palpitations or presyncope with ADL. The patient also denies any orthopnea, PND and lower extremity edema. Upon examination of her AICD site, there seems to be no signs of infection such as erythema,  the patient denies any warmth on touch, no drainage and no pain. The patient does report mild nausea that occurred last night as well as this morning with no other associated symptoms. The patient has a followup with her  PCP tomorrow. The patient denies  any fever, malaise, abdominal pain, diarrhea or hematemesis.    The patient was previously admitted at [facility] between August 16, 2018, through August 27, 2018, secondary to witnessed VFib cardiac arrest. This occurred at home in front  of her family. The patient was resuscitated by EMS, intubated and underwent therapeutic hypothermia protocol. The patient's echo showed EF of 20%. The patient had a left heart cath, which was negative for CAD. The patient's mental status improved after  rewarming with  successful extubation. Prior to discharge, the patient had a bi-V AICD placed on August 26, 2018.  __  PAST HISTORY      Past Medical Illnesses: DM Type 2, hyperlipidemia, PE, fibromyalgia, Hypertension;  Past Cardiac Illnesses : Cardiac Arrest june 2020; Infectious Diseases: No previous history of significant infectious diseases.;  Surgical Procedures: chest xray, ICD; Trauma History: No previous history of significant trauma.;  Cardiology Procedures-Invasive: Cath (Right and Left) 5/08, Defibrillator implant June 2020; Cardiology Procedures-Noninvasive : ECHO 5/08, Thallium ST date?, Echocardiogram September 2008, Echocardiogram May 2010, Event Monitor May 2010, Echocardiogram October 2012, Echo 07/31/12, Echocardiogram July 2017, Echocardiogram October 2017;  Cardiac Cath Results: 5/08- RV mean wedge press 8 w/o V wave, PA 25/15, RV 25/6, RA mean 6, CO 4.5, LV mod hypokin, EF 35-40%, normal coronary arteries;  Left Ventricular Ejection Fraction: LVEF of 40% documented via echocardiogram on 12/14/2015; Peripheral Vascular Procedures : carotid 07/30/12, Extremity Legs Duplex 02/24/11, Carotid NIVA December 2018  PHYSICAL EXAMINATION     Vital Signs:  Blood Pressure:  123/67 Taken by pt     Weight: 130.20 lbs.  Height: 72.00"  BMI:  17.66   Pulse: 79/min.       Constitutional:  alert and oriented Skin: no apparent skin lesions Head : normocephalic Eyes: conjunctivae and lids normal  ENT: No pallor or cyanosis Neck:  no JVD Chest: no use of accessory muscles Extremities/Back : No clubbing, cyanosis or edema Neurological: oriented to time, person and place   __     Medications added today by the physician:    IMPRESSIONS:   1. Left upper extremity DVT requiring [facility] [place] hospitalization between August 30, 2018, and September 08, 2018. The stay was complicated by hematemesis requiring EGD. Nonbleeding duodenal ulcer was found and was treated with epi as well as cautery and clipping.  2. VFib cardiac arrest witnessed,  hospitalized August 16, 2018, through August 27, 2018, successfully resuscitated by EMS, intubated, underwent successful therapeutic hypothermia protocol and then extubated. Echo showed EF of 20%. LHC was negative for CAD. Bi-V AICD placed on August 26, 2018, prior to discharge.  3. Chronic systolic  heart failure, NYHA class II function, unknown dry weight - on guideline-directed medical therapy (Coreg, Entresto, spironolactone). Echo August 19, 2018, showing severely depressed LV function, EF 20%, dyskinesis of the basal and mid anterior, anteroseptal  and inferoseptal LV wall. Moderate hypokinesis of the basal inferior wall. Normal RV function. Compared to study October 2017, EF has decreased from 40% to 21%. No significant valve disease.   4. Nonischemic cardiomyopathy - cardiac cath August 21, 2018, did not show any CAD.  5. Hypertension - controlled.  6. Hypercholesterolemia - on statin therapy with LDL 88 on Aug 09, 2018, lipids.  7. Chronic left bundle branch block.  8. Type 2 diabetes.  9. Renal insufficiency with most  recent creatinine 1.2 on September 08, 2018.  10. Bilateral carotid artery disease - most recent  carotid Doppler March 08, 2018, showing mild progression of her right carotid, 50% to 69% stenosis as well as left carotid 50% to 69% stenosis, normal antegrade  flow in both vertebral arteries. Compared to 2018 study, mild progression. Advised to repeat in one year.    RECOMMENDATIONS:   1. Continue current  GDMT for heart failure.  2. Continue to monitor daily blood pressure and weight and call with any signs of fluid retention, such as weight gain of more than two pounds in one day or five pounds in one week or worsening lower extremity edema or shortness  of breath.  3. Pending repeat CBC with PCP tomorrow to monitor anemia secondary to hematemesis.  4. Repeat BMP in two weeks to reassess renal function and electrolytes.  5. Follow up in three weeks for continued heart failure management.   6. Pending  hematology followup to transition Lovenox to Eliquis.  7. Pending AICD check October 23, 2018.    Kelcy Baeten Elmyra Ricks, FNP-BC     Tid: [id number]    cc: Arnoldo Lenis MD  ____________________________   Christianne Dolin  Basic Metabolic Panel 2 weeks  NP/PA Telehealth Visit 30 Min 3 weeks

## 2020-06-15 NOTE — Progress Notes (Signed)
[facility] OFFICE   [address] [place], [place] [address]           Tara Perry    Date of Visit:  03/03/2019  Date of Birth: 1946/03/13  Age: 75 yrs.   Medical Record Number: [id number]  __  CURRENT DIAGNOSES     1. Type 2 diabetes mellitus without  complications, E11.9  2. Hypercholesterolemia unspecified, E78.00  3. Essential (primary) hypertension, I10  4. Nonrheumatic mitral (valve) insufficiency, I34.0  5. Other cardiomyopathies, I42.8  6. Left bundle-branch block, unspecified,  I44.7  7. Cardiac Arrest, Cause Unspecified, I46.9  8. Ventricular Fibrillation, I49.01  9. CHF chronic systolic, I50.22  10. 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 Localized  Edema, R60.0  __  ALLERGIES    Shellfish Derived, Intolerance-unknown   __  MEDICATIONS     1. atorvastatin 40 mg tablet, take 2 tabs po qhs  2. carvedilol 12.5 mg tablet, 1 po bid  3. Centrum Silver Tablet,  1 p.o. q.d.  4. Eliquis 5 mg tablet, 1 po bid  5. Entresto 49 mg-51 mg tablet, 1 po bid  6. Jardiance 10 mg tablet, 1 po qd  7. metformin ER 1,000 mg tablet,extended release 24hr, 1 po bid  8. Tylenol 325 mg capsule, 2 po PRN pain   9. Vitamin B-12 1,000 mcg Tablet, 1 po qd  __  CHIEF COMPLAINT/REASON FOR VISIT  Followup of CHF chronic systolic, Followup of Essential (primary)  hypertension and Followup of Other cardiomyopathies  __  HISTORY OF PRESENT ILLNESS    Patient is a pleasant 75 year old female here for a three  month follow up in our heart failure clinic. She had a recent echocardiogram in August 2020, which showed an improved EF of 40%. At the last visit, Carvedilol was increased to 6.25mg  BID and Entresto to 49/51mg  BID for cardiomyopathy. Recent blood work  found on EPIC showed a creatinine of 1.6 and potassium level of 4.4.    Upon medication review, it was found that her spironolactone was discontinued by her PCP about 2 months ago for unclear reasons. She tells me she  had recent blood work done  with her PCP. Otherwise, from a cardiac perspective she has been doing well. She is walking 1 to 2 miles daily without any exertional symptoms or cardiac complaints. She tells me she is feeling great. Her blood pressures have been stable in the systolic  110s to 120s with heart rates in the 80s.  She denies any chest pain/discomfort, shortness of breath, dizziness, palpitations, orthopnea, PND, presyncope, syncope or edema.      __  PAST HISTORY     Past Medical Illnesses:  DM Type 2, hyperlipidemia, PE, fibromyalgia, Hypertension;  Past Cardiac Illnesses: Cardiac Arrest june  2020; Infectious Diseases: No previous history of significant infectious diseases.; Surgical Procedures : chest xray, ICD; Trauma History: No previous history of significant trauma.; Cardiology Procedures-Invasive : Cath (Right and Left) 5/08, Defibrillator implant June 2020; Cardiology Procedures-Noninvasive: ECHO 5/08, Thallium ST date?, Echocardiogram September  2008, Echocardiogram May 2010, Event Monitor May 2010, Echocardiogram October 2012, Echo 07/31/12, Echocardiogram July 2017, Echocardiogram October 2017, Echocardiogram August 2020; Cardiac Cath Results : 5/08- RV mean wedge press 8 w/o V wave, PA 25/15, RV 25/6, RA mean 6, CO 4.5, LV mod hypokin, EF 35-40%, normal coronary arteries; Left Ventricular Ejection Fraction : LVEF of 40% documented via echocardiogram on 11/05/2018; Peripheral Vascular  Procedures: carotid 07/30/12, Extremity Legs Duplex 02/24/11, Carotid NIVA  December 2018  ___  FAMILY HISTORY  Father --  Heart Attack  MaternalGrandparent -- Heart Attack  Mother --  Heart disorder (MVP)    __  CARDIAC RISK FACTORS     Tobacco Abuse: has never used tobacco;  Family History of Heart Disease: positive; Hyperlipidemia: positive;  Hypertension: negative;  Diabetes Mellitus: positive;  Prior History of Heart Disease: negative; Obesity: positive;  Sedentary Life Style:negative; NWG:[id number]; Menopausal  :biological menopause  __  SOCIAL HISTORY     Alcohol Use: Rarely; Smoking: never smoked; Never smoker ([id number]);  Diet: Regular diet and Caffeine use-1-2 per day; Exercise: walking daily 1miles/d;   __   PHYSICAL EXAMINATION    Vital Signs:  Blood Pressure:  124/70 Sitting, Left arm, regular  cuff  124/70 Sitting, Right arm, regular cuff    Weight: 123.00 lbs.  Height:  64"  BMI: 21.11   Pulse: 78/min.        Constitutional: cooperative, alert, oriented, in no acute distress Skin: warm and dry to touch, no  apparent skin lesions Head: normocephalic Eyes:  conjunctivae and lids normal ENT: wearing mask Neck : no JVD Chest: clear to auscultation bilaterally, no use of accessory muscles, normal respiratory effort  Cardiac: Regular rhythm, S1 normal, S2 normal, 2/6 LSB HSM Abdomen: abdomen normal, abdomen soft, bowel  sounds normoactive Peripheral Pulses: pulses full and equal in all extremities Extremities/Back : no edema present, no erythema Neurological: oriented to time, person and place, affect appropriate   __    Medications added today by the  physician:  carvedilol 12.5 mg tablet, 1 po bid, 180      IMPRESSIONS:   1. Chronic systolic heart failure, NYHA class II function, presumed dry wt 128lbs.   On guideline-directed medical therapy (Coreg and Entresto)   a. Echo  August 19, 2018, showing severely depressed LV function, EF 20%, dyskinesis   of the basal and mid anterior, anteroseptal and inferoseptal LV wall. Moderate hypokinesis   of the basal inferior wall. Normal RV function. Compared to study October 2017,  EF has   decreased from 40% to 21%. No significant valve disease.   b. Echocardiogram 11/05/2018: Improvement in ejection fraction to 40%.  2. Nonischemic cardiomyopathy - cardiac cath August 21, 2018, no CAD  3. H/o VFib cardiac arrest witnessed,  hospitalized August 16, 2018, through August 27, 2018, successfully   resuscitated by EMS, intubated, underwent successful therapeutic hypothermia protocol and then  extubated.   Echo showed EF of 20%. LHC was negative for CAD.   4. S/p Bi-V AICD  placed on August 26, 2018, prior to discharge in 08/2018  5. Prior left upper extremity DVT requiring [facility] [place] hospitalization between August 30, 2018, and September 08, 2018,   with anticoagulation at that time complicated by hematemesis requiring  EGD. Nonbleeding duodenal   ulcer was found and was treated with epi as well as cautery and clipping. -  6. Hypertension - controlled.  7. Hypercholesterolemia - on statin therapy with LDL 88 on Aug 09, 2018, lipids.  8. Chronic left bundle  branch block.  9. Type 2 diabetes.  10. Renal insufficiency with most recent creatinine 1.2 on September 08, 2018.  11. Bilateral carotid artery disease - most recent carotid Doppler March 08, 2018, showing mild progression   of her right  carotid, 50% to 69% stenosis as well as left carotid 50% to 69% stenosis, normal antegrade   flow in  both vertebral arteries. Compared to 2018 study, mild progression, on ASA and lipitor    RECOMMENDATIONS:  1. Increase carvedilol to 12.5mg   twice daily for cardiomyopathy.  2. Continue remainder of cardiac regimen without change. Of note, Spironolactone was discontinued   by her PCP for unclear reasons. Recent BMP showed creatinine of 1.6 and potassium level of 4.4.   Patient  tells me she had recent blood work done. Will request labs from PCP   3. Continue follow up with PCP for routine blood work. Will request recent blood work.   4. Encouraged upkeep in regular cardiovascular exercise as tolerated. She continues  to walk 1-35miles   daily without change in functional capacity.   5. Continue to monitor blood pressures/heart rate regularly   6. Repeat carotid dopplers as recommended. Patient to schedule this today.   7. Follow up in three months with  Dr. Nash Shearer, or sooner if needed.       Conception Oms, NP      cc: Tammi Sou MD    This note was generated by the [facility] EMR system/Dragon speech recognition and may contain  errors or omissions not intended by the user. Grammatical  errors, random word insertions, deletions, pronoun errors, and incomplete sentences are occasional consequences of this technology due to software limitations. Not all errors are caught or corrected. If there are questions or concerns about the content  of this note or information contained within the body of this dictation, they should be addressed directly with the author for clarification.

## 2020-06-15 NOTE — Progress Notes (Signed)
[facility] OFFICE  [address] [place], [place] [address]     Tara Perry    Date of Visit:  10/21/2015  Date of Birth: March 17, 1945  Age: 75 yrs.   Medical Record Number: [id number]  Referring Physician: Izola Price NP, Gisela Lea  __   CURRENT DIAGNOSES     1. Type 2 diabetes mellitus without complications, E11.9  2. Essential (primary) hypertension, I10  3. Nonrheumatic mitral (valve) insufficiency, I34.0  4. Other  cardiomyopathies, I42.8  5. Left bundle-branch block, unspecified, I44.7  6. CHF chronic systolic, I50.22  __  ALLERGIES     Shellfish Derived, Intolerance-unknown  __  MEDICATIONS     1. Januvia 100 mg Tablet, 1 po qd   2. Vitamin B-12 1,000 mcg Tablet, 1 po qd  3. Aspirin 81 mg Tablet, Chewable, 1 p.o. q.d.  4. Centrum Silver Tablet, 1 p.o. q.d.  5. carvedilol 25 mg tablet, 1 po bid  6. atorvastatin 20 mg tablet, 1 po qd  7. glimepiride 2 mg tablet,  1 po qd  8. metformin 500 mg tablet, 2 po bid  9. sacubitril 24 mg-valsartan 26 mg tablet, 1 tablet PO b.i.d.  __  CHIEF COMPLAINT/REASON FOR VISIT   Followup of Other cardiomyopathies  __  HISTORY OF PRESENT ILLNESS  Tara Perry is a 75 year old female who presents for followup. She was last  seen by me on October 06, 2015, for followup of nonischemic cardiomyopathy. She had previously been switched from lisinopril to low dose and Entresto and started on spironolactone. At her last office visit, I continued the [facility], but discontinued the  spironolactone due to an elevated creatinine of 1.6. Repeat blood test on spironolactone showed a creatinine level of 1.5 and a potassium of 5.1. She continues to do well on Entresto. She has been increasing her activity. Over the weekend, she walked  her dog four to five blocks, which included going up one pill. She did not have any complaints of chest pain or shortness of breath. She continues to walk up and down her stairs at home without any major issues. She also carries her grand kids, at times   throughout the house, without any symptoms. She denies interval palpitations, orthopnea, PND, dizziness, presyncope, or syncope. There is no lower extremity edema. She sleeps on one pillow, which is unchanged from her baseline. She still plans to schedule  a consult appointment with a nephrologist.  _____  PAST HISTORY     Past Medical Illnesses : DM Type 2, hyperlipidemia, PE, fibromyalgia, Hypertension;  Past Cardiac Illnesses: LBBB, CHF, cardiomyopathy,  Palpitations, mitral valve regurgitation; Infectious Diseases: No previous history of significant infectious diseases.;  Surgical Procedures: chest xray; Trauma History: No previous history of significant trauma.;  Cardiology Procedures-Invasive: Cath (Right and Left) 5/08; Cardiology Procedures-Noninvasive: ECHO 5/08,  Thallium ST date?, Echocardiogram September 2008, Echocardiogram May 2010, Event Monitor May 2010, Echocardiogram October 2012, Echo 07/31/12, Echocardiogram July 2017; Cardiac Cath Results : 5/08- RV mean wedge press 8 w/o V wave, PA 25/15, RV 25/6, RA mean 6, CO 4.5, LV mod hypokin, EF 35-40%, normal coronary arteries; Left Ventricular Ejection Fraction : LVEF of 25% documented via echocardiogram on 09/13/2015; Peripheral Vascular Procedures: carotid 07/30/12, Extremity Legs Duplex 02/24/11   ___  FAMILY HISTORY  Father -- Heart Attack   MaternalGrandparent -- Heart Attack  Mother -- Heart disorder (MVP)     __  CARDIAC RISK FACTORS     Tobacco Abuse: has never used tobacco;  Family  History of Heart Disease: positive; Hyperlipidemia: positive;  Hypertension: negative;  Diabetes Mellitus: positive;  Prior History of Heart Disease: negative; Obesity: positive;  Sedentary Life Style:negative; [id number]; Menopausal :biological menopause  __  SOCIAL HISTORY    Alcohol Use : Rarely; Smoking: never smoked; Never smoker ([id number]); Diet : Regular diet and Caffeine use-1-2 per day; Exercise: walking daily 67miles/d;   __  PHYSICAL  EXAMINATION    Vital  Signs:  Blood Pressure:   100/56 Sitting, Left arm, regular cuff  104/60 Sitting, Right arm, regular cuff    Weight: 145.00 lbs.   Height: 64"  BMI: 25   Pulse:  76/min.       Constitutional: Cooperative, alert and oriented,well developed, well nourished,  in no acute distress. Skin: Warm and dry to touch, no apparent skin lesions, or masses noted. Head : Normocephalic, normal hair pattern, no masses or tenderness Eyes: conjunctivae and lids normal  Neck: carotid pulses are full and equal bilaterally, no JVD, soft bruits bilaterally RL Chest : clear to auscultation bilaterally, no use of accessory muscles, normal respiratory effort Cardiac: Regular rhythm, S1 normal, S2 normal, No S3 or S4,  Apical impulse not displaced, 2/6 LSB HSM Abdomen: Abdomen soft, bowel sounds normoactive, no masses, no hepatosplenomegaly, non-tender, no bruits  Peripheral Pulses: 2+ radial and DP pulses b/l Extremities/Back : no cyanosis present, no clubbing present, no edema present Neurological: No gross motor or sensory  deficits noted, affect appropriate, oriented to time, person and place.   __    Medications added today by the physician:  sacubitril 24 mg-valsartan  26 mg tablet, 1 tablet PO b.i.d., 180      IMPRESSIONS:   1. Nonischemic cardiomyopathy, decompensating with adverse remodeling. Has had    good response with low-dose Entresto. Off spironolactone in setting of elevated creatinine/K+.   a. Currently with NYHA class I-II symptoms.  b. Echocardiogram on 09/13/2015 showed LVEF 25-30% and newly dilated   left ventricle (down from  LVEF 40-45% on 07/31/2012 echo).  c. 09/30/2015 creatinine 1.6 (up from 1.37 on 10/29/2013).  2. Hypertension, currently controlled.  3. Hypertriglyceridemia.  4. Left bundle branch block.  a. LHC in 2008 showed normal coronary arteries.   5. Type 2 diabetes, currently on MTFN. Managed by endocrinologist.   6. Elevated creatinine. Still needs to schedule consult with nephrologist.      RECOMMENDATIONS:    1. Continue current cardiac medications as prescribed.  2. Repeat echocardiogram in two months, prior to next office visit with Dr. Nash Shearer.  3. Consider AICD if LVEF remains less than or equal to 35%.  4. Discussed medication use/dosing/side  effects.  5. Followup office visit with Dr. Nash Shearer on December 24, 2015, and schedule.  6. Recommended schedule consult with nephrologist.  7. Routine followup with PCP.  8. Again discussed enrollment in DAPA-HF with patient who will discuss  this more with her   family and consider.    Tara Perry, ANP-BC, AGACNP-BC     Tid: [id number]:GT:[id number]    cc Arnoldo Lenis MD    SJ  ____________________________   TODAYS ORDERS  2D, color flow, doppler 2 months - prior to OV with TH

## 2020-06-15 NOTE — Progress Notes (Signed)
[facility] OFFICE  [address] [place], [place] [address]     Yvonne Kendall    Date of Visit:  03/18/2015  Date of Birth: [birth year]  Age: 75 yrs.   Medical Record Number: [id number]  __  CURRENT DIAGNOSES     1. Essential (primary) hypertension,  I10  2. Nonrheumatic mitral (valve) insufficiency, I34.0  3. Other cardiomyopathies, I42.8  4. Type 2 diabetes mellitus without complications, E11.9  __  ALLERGIES     Shellfish Derived, Intolerance-unknown  __  MEDICATIONS     1. glimepiride 2 mg Tablet, 1 po  qhs  2. Januvia 100 mg Tablet, 1 po qd  3. Vitamin B-12 1,000 mcg Tablet, 1 po qd  4. Aspirin 81 mg Tablet, Chewable, 1 p.o. q.d.  5. Centrum Silver Tablet, 1 p.o. q.d.  6. Glucophage 1,000 mg Tablet, 1 p.o. b.i.d.  7. lisinopril  20 mg tablet, 1 po qd  8. carvedilol 25 mg tablet, 1 po bid  9. atorvastatin 20 mg tablet, 1 po qd  __  CHIEF COMPLAINT/REASON FOR VISIT   Followup of Cardiomyopathy, nonischemic EF 45%  __  HISTORY OF PRESENT ILLNESS  Doing well. No shortness of breath or chest pain. Walking a few  days a week, but not getting regular daily exercise. Otherwise doing well.  __  PAST HISTORY      Past Medical Illnesses: DM Type 2, hyperlipidemia, PE, fibromyalgia, Hypertension;  Past Cardiac Illnesses : LBBB, CHF, cardiomyopathy, Palpitations, mitral valve regurgitation; Infectious Diseases: No previous history of significant infectious diseases.;  Surgical Procedures: chest xray; Trauma History: No previous history of significant trauma.;  Cardiology Procedures-Invasive: Cath (Right and Left) 5/08; Cardiology Procedures-Noninvasive: ECHO 5/08,  Thallium ST date?, Echocardiogram September 2008, Echocardiogram May 2010, Event Monitor May 2010, Echocardiogram October 2012, Echo 07/31/12; Cardiac Cath Results : 5/08- RV mean wedge press 8 w/o V wave, PA 25/15, RV 25/6, RA mean 6, CO 4.5, LV mod hypokin, EF 35-40%, normal coronary arteries; Left Ventricular Ejection Fraction : LVEF of 40%  documented via echocardiogram on 07/31/2012; Peripheral Vascular Procedures: carotid 07/30/12, Extremity Legs Duplex 02/24/11   PHYSICAL EXAMINATION    Vital Signs:  Blood Pressure:  124/60 Sitting, Left arm, regular  cuff  124/60 Sitting, Right arm, regular cuff    Weight: 145.00 lbs.  Height:  64"  BMI: 25   Pulse: 62/min. Apical        Constitutional: Cooperative, alert and oriented,well developed, well nourished, in no acute distress. Skin:  Warm and dry to touch, no apparent skin lesions, or masses noted. Head: Normocephalic, normal hair pattern, no masses or tenderness  Eyes: EOMS intact, conjunctivae and lids unremarkable ENT: Ears, Nose and throat reveal no gross abnormalities.  No pallor or cyanosis. Dentition good. Neck: carotid pulses are full and equal bilaterally, no JVD, soft bruits bilaterally RL  Chest: Normal symmetry, no tenderness to palpation, normal respiratory excursion, no intercostal retraction, no use of accessory muscles, normal diaphragmatic excursion, clear to auscultation and percussion.  Cardiac: Regular rhythm, S1 normal, S2 normal, No S3 or S4, Apical impulse not displaced, 2/6 LSB HSM Abdomen : Abdomen soft, bowel sounds normoactive, no masses, no hepatosplenomegaly, non-tender, no bruits Peripheral Pulses: pulses full and equal in all extremities  Extremities/Back: No deformities, clubbing, cyanosis, erythema or edema observed. There are no spinal abnormalities noted. Normal muscle strength and tone.  Neurological: No gross motor or sensory deficits noted, affect appropriate, oriented to time, person and place.   __  Medications added today  by the physician:  atorvastatin 20 mg tablet, 1 po qd, 90  carvedilol 25 mg tablet, 1 po bid, 180  lisinopril 20 mg tablet, 1 po qd, 90    IMPRESSION:  1. Nonischemic cardiomyopathy.  Ejection fraction 40-45% by echo Jul 31, 2012.   2. Left bundle-branch block with normal coronary arteries by cardiac catheterization 2008.   3. Type 2 diabetes.    4. Hypertriglyceridemia.   5. Hypertension.     RECOMMENDATIONS:    1. Very well compensated with regards to her nonischemic cardiomyopathy. She has no evidence  of active volume overload and no limitations in terms symptoms. Continue Coreg and lisinopril.  2. I encouraged her to increase her exercise to 30 minutes  daily.  3. Followup echo in six months to reassess her LVF as well as her valvular disease.    Timmothy Euler, MD     Tid: [id number]:    eca    ____________________________  TODAYS ORDERS  12 Lead ECG Today  Return Visit 15 MIN 6 months  2D, color flow, doppler 6 months

## 2020-06-15 NOTE — Progress Notes (Signed)
[facility] OFFICE  [address] [place], [place] [address]     Tara Perry    Date of Visit:  08/11/2016  Date of Birth: 1945-04-29  Age: 75 yrs.   Medical Record Number: [id number]  __  CURRENT DIAGNOSES     1. Type 2 diabetes mellitus without  complications, E11.9  2. Essential (primary) hypertension, I10  3. Nonrheumatic mitral (valve) insufficiency, I34.0  4. Other cardiomyopathies, I42.8  5. Left bundle-branch block, unspecified, I44.7  6. CHF chronic systolic, I50.22   __  ALLERGIES    Shellfish Derived, Intolerance-unknown   __  MEDICATIONS     1. Aspirin 81 mg Tablet, Chewable, 1 p.o. q.d.  2. atorvastatin 20 mg tablet, 1 po qd  3. carvedilol 25 mg tablet,  1 po bid  4. Centrum Silver Tablet, 1 p.o. q.d.  5. Entresto 49 mg-51 mg tablet, 1 po bid  6. glimepiride 2 mg tablet, 1 po qd  7. Januvia 100 mg Tablet, 1 po qd  8. metformin 500 mg tablet, 2 po bid  9. Research Study Drug -,  1 po q am DAPA HF Dapagliflozin 10mg  or placebo  10. Vitamin B-12 1,000 mcg Tablet, 1 po qd  __  CHIEF COMPLAINT/REASON FOR VISIT  Followup  of CHF chronic systolic  __  HISTORY OF PRESENT ILLNESS  Doing well, denies any shortness of breath or chest pain. No paroxysmal nocturnal dyspnea,  orthopnea or leg swelling. Blood pressure has been stable.  __  PAST HISTORY     Past Medical  Illnesses: DM Type 2, hyperlipidemia, PE, fibromyalgia, Hypertension;  Past Cardiac Illnesses : LBBB, CHF, cardiomyopathy, Palpitations, mitral valve regurgitation; Infectious Diseases: No previous history of significant infectious diseases.;  Surgical Procedures: chest xray; Trauma History: No previous history of significant trauma.;  Cardiology Procedures-Invasive: Cath (Right and Left) 5/08; Cardiology Procedures-Noninvasive: ECHO 5/08,  Thallium ST date?, Echocardiogram September 2008, Echocardiogram May 2010, Event Monitor May 2010, Echocardiogram October 2012, Echo 07/31/12, Echocardiogram July 2017, Echocardiogram October 2017;  Cardiac  Cath Results: 5/08- RV mean wedge press 8 w/o V wave, PA 25/15, RV 25/6, RA mean 6, CO 4.5, LV mod hypokin, EF 35-40%, normal coronary arteries; Left Ventricular  Ejection Fraction: LVEF of 40% documented via echocardiogram on 12/14/2015; Peripheral Vascular Procedures : carotid 07/30/12, Extremity Legs Duplex 02/24/11  PHYSICAL EXAMINATION    Vital Signs :  Blood Pressure:  128/64 Sitting, Left arm, regular cuff  126/64 Sitting, Right arm, regular cuff     Weight: 146.20 lbs.  Height: 72.00"  BMI:  20   Pulse: 86/min. Apical Regular        Constitutional: Cooperative, alert and oriented,well developed, well nourished, in no acute distress. Skin:  Warm and dry to touch, no apparent skin lesions, or masses noted. Head: Normocephalic, normal hair pattern,  no masses or tenderness Eyes: conjunctivae and lids normal Neck : carotid pulses are full and equal bilaterally, no JVD, soft bruits bilaterally RL Chest: clear to auscultation  bilaterally, no use of accessory muscles, normal respiratory effort Cardiac: Regular rhythm, S1 normal, S2 normal, No S3 or S4, Apical impulse not displaced,  2/6 LSB HSM Abdomen: abdomen soft, non-tender Peripheral Pulses : 2+ radial pulses b/l Extremities/Back: no edema present  Psychiatric: normal memory Neurological:  No gross motor or sensory deficits noted, affect appropriate, oriented to time, person and place.   __    Medications added today by the physician:     Entresto 49-51 mg tablet, 1  po bid, 180    IMPRESSIONS:   1. Non-ischemic cardiomyopathy, with LVEF 40% on most recent echo 12/14/2015 (up from  25-30%   on 09/2015 echo). Currently on carvedilol and Entresto.   2. Chronic systolic CHF, stable NYHA class II symptoms. Euvolemic on exam today.  3. Hypertension, controlled.  4. Hypertriglyceridemia, on statin. 10/29/2013 TC 142, TG  134, HLD 54, LDL 61.  5. Chronic left bundle branch block.   6. LHC 2008: normal coronary arteries.  7. Type 2 diabetes.  8. Renal  insufficiency  9. Currently enrolled in DAPA-HF trial.     RECOMMENDATIONS:   1. Now that her blood pressure has been stable for a while now on the low dose Entresto,   we will go ahead and increase her dose to 49/51.  2. Reviewed her renal function,  which has been stable and actually improving this year.  3. Follow up in six months' time.    Timmothy Euler, MD, [facility]     Tid: [id number]     cc: Arnoldo Lenis MD    AP  ____________________________  Christianne Dolin   Return Visit 15 MIN 6 months

## 2020-06-15 NOTE — Progress Notes (Signed)
[facility] OFFICE  [address] [place], [place] [address]     Tara Perry    Date of Visit:  09/22/2015  Date of Birth: 11-29-1945  Age: 75 yrs.   Medical Record Number: [id number]  __  CURRENT DIAGNOSES     1. Essential (primary) hypertension,  I10  2. Nonrheumatic mitral (valve) insufficiency, I34.0  3. Other cardiomyopathies, I42.8  4. Type 2 diabetes mellitus without complications, E11.9  __  ALLERGIES     Shellfish Derived, Intolerance-unknown  __  MEDICATIONS     1. Januvia 100 mg Tablet, 1 po qd   2. Vitamin B-12 1,000 mcg Tablet, 1 po qd  3. Aspirin 81 mg Tablet, Chewable, 1 p.o. q.d.  4. Centrum Silver Tablet, 1 p.o. q.d.  5. carvedilol 25 mg tablet, 1 po bid  6. atorvastatin 20 mg tablet, 1 po qd  7. glimepiride 2 mg tablet,  2 po bid  8. spironolactone 25 mg tablet, 1 po qd  9. sacubitril 24 mg-valsartan 26 mg tablet, 1 po bid  __  CHIEF COMPLAINT/REASON FOR VISIT   Followup of Other cardiomyopathies  __  HISTORY OF PRESENT ILLNESS  Tara Perry has not been doing as well since her last visit in January. She has  been getting perceptively more short of breath with exertion. Recently she has been getting winded just walking several steps up a flight of stairs. She has been avoiding carrying her Doberman dog as she gets too winded when she carries him. She is okay  on a flat surface, but anything more than that makes her short of breath. She denies any paroxysmal nocturnal dyspnea, orthopnea or worsening leg swelling. Echocardiogram on September 13, 2015, showed adverse remodeling. Her left ventricle has dilated to 6.1  cm (was previously normal in size in 2014). Her left ventricular ejection fraction decreased from 40% to 45% back in May 2014 to 25% to 30% on her most recent echo on September 13, 2015. Her sugars have been higher than usual as well.  __   PAST HISTORY     Past Medical Illnesses: DM Type 2, hyperlipidemia, PE, fibromyalgia, Hypertension;   Past Cardiac Illnesses: LBBB, CHF,  cardiomyopathy, Palpitations, mitral valve regurgitation; Infectious  Diseases: No previous history of significant infectious diseases.; Surgical Procedures: chest xray;  Trauma History: No previous history of significant trauma.; Cardiology Procedures-Invasive: Cath (Right  and Left) 5/08; Cardiology Procedures-Noninvasive: ECHO 5/08, Thallium ST date?, Echocardiogram September 2008, Echocardiogram May 2010, Event Monitor  May 2010, Echocardiogram October 2012, Echo 07/31/12, Echocardiogram July 2017; Cardiac Cath Results: 5/08- RV mean wedge press 8 w/o V wave, PA 25/15,  RV 25/6, RA mean 6, CO 4.5, LV mod hypokin, EF 35-40%, normal coronary arteries; Left Ventricular Ejection Fraction: LVEF of 25% documented via echocardiogram  on 09/13/2015; Peripheral Vascular Procedures: carotid 07/30/12, Extremity Legs Duplex 02/24/11  PHYSICAL  EXAMINATION    Vital Signs:  Blood Pressure:   140/80 Sitting, Left arm, regular cuff  138/80 Sitting, Right arm, regular cuff    Weight: 144.00 lbs.   Height: 64"  BMI: 25   Pulse:  76/min.       Constitutional: Cooperative, alert and oriented,well developed, well nourished,  in no acute distress. Skin: Warm and dry to touch, no apparent skin lesions, or masses noted. Head : Normocephalic, normal hair pattern, no masses or tenderness Eyes: EOMS intact, conjunctivae and lids  unremarkable ENT: Ears, Nose and throat reveal no gross abnormalities. No pallor or cyanosis. Dentition  good. Neck: carotid pulses are full and equal bilaterally, no JVD, soft bruits bilaterally RL  Chest: Normal symmetry, no tenderness to palpation, normal respiratory excursion, no intercostal retraction, no use of accessory muscles, normal diaphragmatic excursion, clear to auscultation and percussion.  Cardiac: Regular rhythm, S1 normal, S2 normal, No S3 or S4, Apical impulse not displaced, 2/6 LSB HSM Abdomen : Abdomen soft, bowel sounds normoactive, no masses, no hepatosplenomegaly, non-tender, no bruits  Peripheral Pulses:  pulses full and equal in all extremities Extremities/Back: No deformities, clubbing, cyanosis, erythema or edema observed. There are no spinal abnormalities  noted. Normal muscle strength and tone. Neurological: No gross motor or sensory deficits noted, affect  appropriate, oriented to time, person and place.   __    Medications added today by the physician:  spironolactone 25 mg tablet, 1 po qd, 90     IMPRESSIONS:   1. Nonischemic cardiomyopathy.  a. Left ventricular ejection fraction 40% to 45% by echo,   Jul 31, 2012.  b. Ejection fraction 25% to 30% with a newly dilated left   ventricle  at 6.1 cm by September 13, 2015, echo.   c. Currently with NYHA class II symptoms.  2. Left bundle branch block with normal coronary arteries by cardiac catheterization   in 2008.  3. Type 2 diabetes.  4. Hypertriglyceridemia.  5. Hypertension.     RECOMMENDATIONS:   1. She seems to be decompensating and having adverse remodeling with regards   to her nonischemic cardiomyopathy. Her left ventricular size is increased from   normal in 2014  to 6.1 cm on her recent echo, September 13, 2015. Her left ventricular   ejection fraction has decreased from 40% to 45% in May 2014 to 25% to 30%   on her July 2017 echo, and most concerning, her symptoms have worsened.   She used to have class  I symptoms and now gets winded walking just a few   steps up the stairs and can no longer carry her dog without getting winded.   In light of her worsening heart failure, I am going to change her lisinopril over   to Entresto 24/26 mg twice  a day. We will also start her on spironolactone   25 mg daily, also for worsening heart failure. We will bring her back in two   weeks' time to our heart failure clinic. At that time, depending on how she is   doing, her Entresto dose can  be increased further to 49/51 or kept at the same   dose depending on how she is doing and how she tolerates the medication.  2. Chemistry panel in two weeks to  reassess her renal function after the above   changes. She will have these labs  done before her heart failure visit.  3. I will otherwise see her back in three months' time to reassess her progress.  4. In addition to the above changes, she is an excellent candidate for the Dapa-HF   study investigating the use of dapagliflozin,  a sodium glucose transporter medication,   the cost of which has shown significant promise in improving cardiovascular   outcomes for patients with diabetes. She could potentially get the medication for free   and have multiple free visits  through the study and given her worsening heart failure,   being in the study would allow [place] to watch her more closely and potentially allow   access to a diabetic medication that could significantly improve her  cardiovascular   outcomes based  on prior data. We will get her screened for the study in about   six weeks, once her heart failure regimen has stabilized.    Timmothy Euler, MD, FACC     Tid: [id number]     cc: Arnoldo Lenis MD    jf   ____________________________  Christianne Dolin   Return Visit 15 MIN 3 months  Heart Failure Clinic Visit 2 weeks  Consider DAPA HF 6 weeks  Basic Metabolic Panel 2 weeks

## 2020-06-15 NOTE — Progress Notes (Signed)
[facility] OFFICE   [address] [place], [place] [address]           Tara Perry    Date of Visit:  05/20/2019  Date of Birth: [birth year]  Age: 75 yrs.   Medical Record Number: [id number]  __  CURRENT DIAGNOSES     1. Type 2 diabetes mellitus without  complications, E11.9  2. Hypercholesterolemia unspecified, E78.00  3. Essential (primary) hypertension, I10  4. Nonrheumatic mitral (valve) insufficiency, I34.0  5. Other cardiomyopathies, I42.8  6. Left bundle-branch block, unspecified,  I44.7  7. Cardiac Arrest, Cause Unspecified, I46.9  8. Ventricular Fibrillation, I49.01  9. CHF chronic systolic, I50.22  10. 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 Localized  Edema, R60.0  __  ALLERGIES    Shellfish Derived, Intolerance-unknown   __  MEDICATIONS     1. atorvastatin 40 mg tablet, take 2 tabs po qhs  2. carvedilol 12.5 mg tablet, 1 po bid  3. Centrum Silver Tablet,  1 p.o. q.d.  4. Eliquis 5 mg tablet, 1 po bid  5. Entresto 49 mg-51 mg tablet, 1 po bid  6. Jardiance 10 mg tablet, 1 po qd  7. metformin ER 1,000 mg tablet,extended release 24hr, 1 po bid  8. Tylenol 325 mg capsule, 2 po PRN pain   9. Vitamin B-12 1,000 mcg Tablet, 1 po qd  __  CHIEF COMPLAINT/REASON FOR VISIT  Followup of CHF chronic systolic  __   HISTORY OF PRESENT ILLNESS    Denies any paroxysmal nocturnal dyspnea, orthopnea, leg edema. Yesterday, she presented to the [facility] emergency room with 1 hour of left-sided facial numbness  without any associated headache, loss of vision, or other neurologic symptoms. Noncontrast head CT looked unremarkable and she was discharged with plans for close follow-up. She recently had a carotid duplex on 05/07/2019 that showed some progression in  her right-sided ICA disease to 70-99% stenosis. Left ICA remained 50 to 69%.    PAST HISTORY      Past Medical Illnesses: DM Type 2, hyperlipidemia, PE, fibromyalgia, Hypertension;  Past  Cardiac Illnesses : Cardiac Arrest june 2020; Infectious Diseases: No previous history of significant infectious diseases.;  Surgical Procedures: chest xray, ICD; Trauma History: No previous history of significant trauma.;  Cardiology Procedures-Invasive: Cath (Right and Left) 5/08, Defibrillator implant June 2020; Cardiology Procedures-Noninvasive : ECHO 5/08, Thallium ST date?, Echocardiogram September 2008, Echocardiogram May 2010, Event Monitor May 2010, Echocardiogram October 2012, Echo 07/31/12, Echocardiogram July 2017, Echocardiogram October 2017, Echocardiogram August 2020;  Cardiac Cath Results: 5/08- RV mean wedge press 8 w/o V wave, PA 25/15, RV 25/6, RA mean 6, CO 4.5, LV mod hypokin, EF 35-40%, normal coronary arteries;  Left Ventricular Ejection Fraction: LVEF of 40% documented via echocardiogram on 11/05/2018; Peripheral Vascular Procedures : carotid 07/30/12, Extremity Legs Duplex 02/24/11, Carotid NIVA December 2018  PHYSICAL EXAMINATION     Vital Signs:  Blood Pressure:  110/64 Sitting, Left arm, regular cuff  108/68 Sitting, Right arm, regular cuff     Weight: 129.00 lbs.  Height: 64.00"  BMI:  22.14   Pulse: 76/min. Apical       Constitutional:  cooperative, alert, oriented, in no acute distress Skin: warm and dry to touch, no apparent skin lesions  Head: normocephalic Eyes: conjunctivae and lids normal  ENT: wearing mask Neck: no JVD Chest : clear to auscultation bilaterally, no use of accessory muscles, normal respiratory effort Cardiac:  Regular rhythm, S1 normal, S2 normal, 2/6 LSB HSM  Abdomen: abdomen normal, abdomen soft, bowel sounds normoactive Peripheral Pulses: pulses full and  equal in all extremities Extremities/Back: no edema present, no erythema Neurological : oriented to time, person and place, affect appropriate   __    Medications added today by the physician:    IMPRESSIONS:   1. Chronic systolic heart failure, NYHA class II function, presumed dry wt 128lbs.   On  guideline-directed  medical therapy (Coreg and Entresto)   a. Echo August 19, 2018, showing severely depressed LV function, EF 20%, dyskinesis   of the basal and mid anterior, anteroseptal and inferoseptal LV wall. Moderate hypokinesis   of the basal inferior wall.  Normal RV function. Compared to study October 2017, EF has   decreased from 40% to 21%. No significant valve disease.   b. Echocardiogram 11/05/2018: Improvement in ejection fraction to 40%.  2. Nonischemic cardiomyopathy - cardiac cath August 21, 2018, no CAD  3. H/o VFib cardiac arrest witnessed, hospitalized August 16, 2018, through August 27, 2018, successfully   resuscitated by EMS, intubated, underwent successful therapeutic hypothermia protocol and then extubated.   Echo showed  EF of 20%. LHC was negative for CAD.   4. S/p Bi-V AICD placed on August 26, 2018, prior to discharge in 08/2018  5. Prior left upper extremity DVT requiring  [facility] hospitalization between August 30, 2018, and September 08, 2018,   with anticoagulation  at that time complicated by hematemesis requiring EGD. Nonbleeding duodenal   ulcer was found and was treated with epi as well as cautery and clipping. -  6. Hypertension - controlled.  7. Hypercholesterolemia - on statin therapy with LDL  88 on Aug 09, 2018, lipids.  8. Chronic left bundle branch block.  9. Type 2 diabetes.  10. Renal insufficiency with most recent creatinine 1.2 on September 08, 2018.  11. Bilateral carotid artery disease  a. Carotid duplex March 08, 2018: Bilateral 50 to 69% ICA disease  b. Carotid duplex 05/07/2019: 709 9% right ICA, 50 to 69% left ICA  c. ER visit 05/19/2019 for left facial numbness suspicious for symptomatic TIA    RECOMMENDATIONS:  1. We discussed the issue of  follow-up imaging of her carotid artery stenosis which becomes even more indicated now that she appears to have had a possible symptomatic TIA (was seen in the hospital yesterday for left facial numbness). She strongly prefers to avoid an MRA  as she is  severely claustrophobic. As such, we will try to get a CT of the carotid arteries. She has a reduced GFR/creatinine but her GFR is above 30 so hopefully that will be acceptable to have the CT with contrast physically given the lack of options in terms  of getting an MRA with her severe claustrophobia. Order placed for contrast CT of the carotid arteries.  2. Continue guideline directed heart failure therapy regimen without change.  3. If carotid CT confirms the presence of a greater than 80%  stenosis then I will refer her to Dr. Zollie Pee for carotid endarterectomy particular given the concern for possible symptomatic TIAs.  4. Follow-up in 6 months time    Timmothy Euler, MD, [facility]      cc: Tammi Sou MD    ____________________________   Christianne Dolin  Return Visit with NP/PA 6 months  Return Visit 15 MIN 1 year

## 2020-06-15 NOTE — Progress Notes (Signed)
[facility] OFFICE  [address] [place], [place] [address]     Tara Perry    Date of Visit:  10/06/2015  Date of Birth: 05/18/1945  Age: 75 yrs.   Medical Record Number: [id number]  __  CURRENT DIAGNOSES     1. Type 2 diabetes mellitus without  complications, E11.9  2. Essential (primary) hypertension, I10  3. Other cardiomyopathies, I42.8  4. Left bundle-branch block, unspecified, I44.7  5. CHF chronic systolic, I50.22  6. Nonrheumatic mitral (valve) insufficiency, I34.0   __  ALLERGIES    Shellfish Derived, Intolerance-unknown   __  MEDICATIONS     1. Januvia 100 mg Tablet, 1 po qd  2. Vitamin B-12 1,000 mcg Tablet, 1 po qd  3. Aspirin 81 mg Tablet, Chewable, 1  p.o. q.d.  4. Centrum Silver Tablet, 1 p.o. q.d.  5. carvedilol 25 mg tablet, 1 po bid  6. atorvastatin 20 mg tablet, 1 po qd  7. sacubitril 24 mg-valsartan 26 mg tablet, 1 po bid  8. glimepiride 2 mg tablet, 1 po qd  9. metformin  500 mg tablet, 2 po bid  __  CHIEF COMPLAINT/REASON FOR VISIT  Followup of CHF chronic systolic  __   HISTORY OF PRESENT ILLNESS  Tara Perry is a 75 year old female who presents for followup. She was last seen by Dr. Nash Shearer on September 22, 2015 for decompensating chronic systolic heart failure. An echocardiogram  on September 13, 2015 showed an LVEF of 25% to 30%, which was down from 40% to 45% on an echo in May 2014. Dr. Nash Shearer started the patient on Entresto 24/26 mg twice daily and spironolactone 25 mg daily. She presents today for titration of her medications. She  reports since starting these medications she has felt significantly better. She reports greater ease when walking up stairs and reduced dyspnea on exertion. She also no longer needs to nap during the day because she has good energy. She has not tried  any more strenuous activity, but overall feels her breathing is much improved. She denies interval chest pain, palpitations, orthopnea, PND, dizziness, presyncope or syncope. There is no lower extremity  edema. She sleeps on one pillow, which is unchanged  from her baseline.     Of note, her creatinine on September 30, 2015 was 1.6. This is up from 1.37 in 2015. There was no more recent creatinine level on file. She has had elevated creatinine in the past. Her endocrinologist had recently discontinued  her metformin because of her renal function, but then more recently restarted it when her blood sugars were elevated. He had recommended she see a nephrologist, which she is in the process of scheduling.  ____   PAST HISTORY     Past Medical Illnesses: DM Type 2, hyperlipidemia, PE, fibromyalgia, Hypertension;   Past Cardiac Illnesses: LBBB, CHF, cardiomyopathy, Palpitations, mitral valve regurgitation; Infectious  Diseases: No previous history of significant infectious diseases.; Surgical Procedures: chest xray;  Trauma History: No previous history of significant trauma.; Cardiology Procedures-Invasive: Cath (Right  and Left) 5/08; Cardiology Procedures-Noninvasive: ECHO 5/08, Thallium ST date?, Echocardiogram September 2008, Echocardiogram May 2010, Event Monitor  May 2010, Echocardiogram October 2012, Echo 07/31/12, Echocardiogram July 2017; Cardiac Cath Results: 5/08- RV mean wedge press 8 w/o V wave, PA 25/15,  RV 25/6, RA mean 6, CO 4.5, LV mod hypokin, EF 35-40%, normal coronary arteries; Left Ventricular Ejection Fraction: LVEF of 25% documented via echocardiogram  on 09/13/2015; Peripheral Vascular Procedures: carotid 07/30/12, Extremity Legs  Duplex 02/24/11   ___  FAMILY HISTORY  Father -- Heart Attack   MaternalGrandparent -- Heart Attack  Mother -- Heart disorder (MVP)     __  CARDIAC RISK FACTORS     Tobacco Abuse: has never used tobacco;  Family History of Heart Disease: positive; Hyperlipidemia: positive;  Hypertension: negative;  Diabetes Mellitus: positive;  Prior History of Heart Disease: negative; Obesity: positive;  Sedentary Life Style:negative; WNU:UVOZDGUY; Menopausal :biological menopause  __  SOCIAL  HISTORY    Alcohol Use : Rarely; Smoking: never smoked; Never smoker ([id number]); Diet : Regular diet and Caffeine use-1-2 per day; Exercise: walking daily 83miles/d;   __  PHYSICAL  EXAMINATION    Vital Signs:  Blood Pressure:   108/70 Sitting, Left arm, regular cuff  110/70 Sitting, Right arm, regular cuff    Weight: 142.40 lbs.   Height: 64"  BMI: 24   Pulse:  82/min.       Constitutional: Cooperative, alert and oriented,well developed, well nourished,  in no acute distress. Skin: Warm and dry to touch, no apparent skin lesions, or masses noted. Head : Normocephalic, normal hair pattern, no masses or tenderness Eyes: conjunctivae and lids normal  Neck: carotid pulses are full and equal bilaterally, no JVD, soft bruits bilaterally RL Chest : clear to auscultation bilaterally, no use of accessory muscles, normal respiratory effort Cardiac: Regular rhythm, S1 normal, S2 normal, No S3 or S4,  Apical impulse not displaced, 2/6 LSB HSM Abdomen: Abdomen soft, bowel sounds normoactive, no masses, no hepatosplenomegaly, non-tender, no bruits  Peripheral Pulses: 2+ radial and DP pulses b/l Extremities/Back : No deformities, clubbing, cyanosis, erythema or edema observed. There are no spinal abnormalities noted. Normal muscle strength and tone. Neurological : No gross motor or sensory deficits noted, affect appropriate, oriented to time, person and place.   __    Medications added today by the physician:       IMPRESSIONS:   1. Nonischemic cardiomyopathy - decompensating with adverse remodeling.   Switched from lisinopril to [facility] 24/26mg  b.i.d. 2 wks ago and started   spironolactone 25mg  daily.  Symptoms greatly improved following medication   changes.  a. LVEF 25% to 30% with a newly dilated left ventricle at 6.1 cm   by September 13, 2015 echo (down from LVEF 40-45% by echo Jul 31, 2012).  a. Currently with NYHA class II symptoms.   b. 09/30/2015 creatinine 1.6 (up from 1.37 on 10/29/2013).  2. Left bundle branch block with  normal coronary arteries by cardiac catheterization in 2008.  3. Type 2 diabetes. Recently restarted MTFN and plans to schedule OV with nephrologist    per pts endocrinologist.   4. Hypertension.  5. Hypertriglyceridemia.    RECOMMENDATIONS:  1. Discontinue spironolactone in setting of elevated  creatinine.  2. Continue low-dose Entresto (gave pt additional samples).  3. Continue all other cardiac medications as prescribed.  4. BMP in 1 wk off spironolactone.  5. Discussed medication use/dosing/side effects.  6. Continue monitoring  wgt at home and call with any changes.  7. Schedule f/u OV here in 2 wks to further titrate medications.  8. Schedule OV with nephrologist in setting of elevated creatinine.  9. Routine f/u with PCP.  10. Provided written instructions  to pt.  11. Consideration for enrollment in DAPA-HR trial after maximize cardiac medications.    Kayson Tasker L. Izola Price, ANP-BC, AGACNP-BC     Tid: [id number]:PI:    cc: Arnoldo Lenis MD    SJ  ____________________________  TODAYS ORDERS  ZO:[id number] Education ICD-10: I34.0 MedlinePlus Connect results for ICD-10 I34.0  Basic Metabolic Panel 1 week  Return Visit with NP/PA 2 weeks

## 2020-06-15 NOTE — Progress Notes (Signed)
[facility] OFFICE  [address] [place], [place] [address]     Tara Perry    Date of Visit:  12/21/2017  Date of Birth: 01-20-1946  Age: 75 yrs.   Medical Record Number: [id number]  Referring Physician: Cristino Martes MD, Shon Hale  __   CURRENT DIAGNOSES     1. Type 2 diabetes mellitus without complications, E11.9  2. Essential (primary) hypertension, I10  3. Nonrheumatic mitral (valve) insufficiency, I34.0  4. Other  cardiomyopathies, I42.8  5. Left bundle-branch block, unspecified, I44.7  6. CHF chronic systolic, I50.22  7. 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 Aspirin 81 mg Tablet, Chewable,  1 p.o. q.d.  2. atorvastatin 20 mg tablet, 1 po qd  3. carvedilol 25 mg tablet, 1 po bid  4. Centrum Silver Tablet, 1 p.o. q.d.  5. Entresto 49 mg-51 mg tablet, 1 po bid  6. glimepiride 2 mg tablet, 1 po qd  7. Januvia 100 mg Tablet,  1 po qd  8. metformin 500 mg tablet, 2 po bid  9. Vitamin B-12 1,000 mcg Tablet, 1 po qd  __  CHIEF COMPLAINT/REASON FOR VISIT  Followup  of Left bundle-branch block, unspecified  __  HISTORY OF PRESENT ILLNESS  Doing well, denies shortness of breath, paroxysmal nocturnal dyspnea,  orthopnea, or leg edema. Has an upcoming eye surgery for which she needed preoperative cardiac risk assessment.    __  PAST HISTORY      Past Medical Illnesses: DM Type 2, hyperlipidemia, PE, fibromyalgia, Hypertension;  Past Cardiac Illnesses : LBBB, CHF, cardiomyopathy, Palpitations, mitral valve regurgitation; Infectious Diseases: No previous history of significant infectious diseases.;  Surgical Procedures: chest xray; Trauma History: No previous history of significant trauma.;  Cardiology Procedures-Invasive: Cath (Right and Left) 5/08; Cardiology Procedures-Noninvasive: ECHO 5/08,  Thallium ST date?, Echocardiogram September 2008, Echocardiogram May 2010, Event Monitor May 2010, Echocardiogram  October 2012, Echo 07/31/12, Echocardiogram July 2017, Echocardiogram October 2017; Cardiac  Cath Results: 5/08- RV mean wedge press 8 w/o V wave, PA 25/15, RV 25/6, RA mean 6, CO 4.5, LV mod hypokin, EF 35-40%, normal coronary arteries; Left Ventricular  Ejection Fraction: LVEF of 40% documented via echocardiogram on 12/14/2015; Peripheral Vascular Procedures : carotid 07/30/12, Extremity Legs Duplex 02/24/11, Carotid NIVA December 2018  PHYSICAL EXAMINATION     Vital Signs:  Blood Pressure:  118/70 Sitting, Left arm, regular cuff  114/72 Sitting, Right arm, regular cuff     Weight: 144.00 lbs.  Height: 72.00"  BMI:  19.53   Pulse: 74/min.       Constitutional:  cooperative, alert, oriented, in no acute distress Skin: Warm and dry to touch, no apparent skin lesions, or masses noted.  Head: Normocephalic, normal hair pattern, no masses or tenderness Eyes: conjunctivae and lids normal  ENT: Ears, Nose and throat reveal no gross abnormalities, No pallor or cyanosis Neck: carotid pulses  are full and equal bilaterally, no JVD, soft bruits bilaterally RL Chest: clear to auscultation bilaterally, no use of accessory muscles, normal respiratory  effort Cardiac: Regular rhythm, S1 normal, S2 normal, No S3 or S4, Apical impulse not displaced, 2/6 LSB HSM  Abdomen: abdomen soft, non-tender Peripheral Pulses: 2+ radial pulses b/l  Extremities/Back: no edema present Psychiatric: normal memory  Neurological: No gross motor or sensory deficits noted, affect appropriate, oriented to time, person and place.   __  Medications added today by the physician:  ECG:  Normal sinus  rhythm, left bundle branch block (old)    IMPRESSIONS:   1. Non-ischemic cardiomyopathy, with LVEF 40% on most recent echo 12/14/2015 (up from 25-  30% on 09/2015 echo). Currently on carvedilol and Entresto.   2. Chronic systolic CHF,  stable NYHA class II symptoms. Euvolemic on exam.  3. Hypertension, controlled.  4. Hypertriglyceridemia, on statin.   5.  Chronic left bundle branch block.   6. LHC 2008: normal coronary arteries.  7. Type 2 diabetes.  8. Renal  insufficiency.  9. Bilateral carotid artery disease (50-69% by December 2018 study).    RECOMMENDATIONS:   1. Continue aspirin and statin for carotid disease.   2. Continue Entresto and carvedilol for cardiomyopathy.   3. Taking  account her asymptomatic cardiac status, will optimized chronic systolic CHF, and the low inherent risk of her eye surgery, her overall cardiac risk is low without the need for any preoperative cardiac testing or medication adjustments.  4. Follow-up  in December as scheduled, or sooner for any concerns.     Timmothy Euler, MD, [facility]      cc: [facility] BETH MCATEER  Arnoldo Lenis MD    ____________________________  TODAYS ORDERS  12 Lead ECG Today  Return Visit 15  MIN 1 year

## 2020-06-23 ENCOUNTER — Other Ambulatory Visit: Payer: Self-pay

## 2020-06-23 ENCOUNTER — Encounter: Payer: Self-pay | Admitting: Hematology

## 2020-06-23 ENCOUNTER — Telehealth: Payer: Self-pay | Admitting: Hematology

## 2020-06-23 DIAGNOSIS — D508 Other iron deficiency anemias: Secondary | ICD-10-CM

## 2020-06-23 DIAGNOSIS — I82622 Acute embolism and thrombosis of deep veins of left upper extremity: Secondary | ICD-10-CM

## 2020-06-23 NOTE — Telephone Encounter (Signed)
Patient called to schedule 6 month f/u with dr Zollie Pee, she is scheduled with NP Vernona Rieger due to sooner availability. Please advice if blood work will be needed at this time.

## 2020-06-23 NOTE — Telephone Encounter (Signed)
I ordered labs for her, can you set her up a lab appointment.   Thank you.

## 2020-06-24 ENCOUNTER — Other Ambulatory Visit (FREE_STANDING_LABORATORY_FACILITY): Payer: Medicare Other

## 2020-06-24 ENCOUNTER — Ambulatory Visit: Payer: Medicare Other | Admitting: Family Nurse Practitioner

## 2020-06-24 DIAGNOSIS — I82622 Acute embolism and thrombosis of deep veins of left upper extremity: Secondary | ICD-10-CM

## 2020-06-24 DIAGNOSIS — D508 Other iron deficiency anemias: Secondary | ICD-10-CM

## 2020-06-24 LAB — FERRITIN: Ferritin: 53.6 ng/mL (ref 4.60–204.00)

## 2020-06-24 LAB — CBC AND DIFFERENTIAL
Absolute NRBC: 0 10*3/uL (ref 0.00–0.00)
Basophils Absolute Automated: 0.07 10*3/uL (ref 0.00–0.08)
Basophils Automated: 0.7 %
Eosinophils Absolute Automated: 0.31 10*3/uL (ref 0.00–0.44)
Eosinophils Automated: 3.1 %
Hematocrit: 38.1 % (ref 34.7–43.7)
Hgb: 12.6 g/dL (ref 11.4–14.8)
Immature Granulocytes Absolute: 0.03 10*3/uL (ref 0.00–0.07)
Immature Granulocytes: 0.3 %
Lymphocytes Absolute Automated: 2.43 10*3/uL (ref 0.42–3.22)
Lymphocytes Automated: 24.1 %
MCH: 29.9 pg (ref 25.1–33.5)
MCHC: 33.1 g/dL (ref 31.5–35.8)
MCV: 90.3 fL (ref 78.0–96.0)
MPV: 10 fL (ref 8.9–12.5)
Monocytes Absolute Automated: 0.99 10*3/uL — ABNORMAL HIGH (ref 0.21–0.85)
Monocytes: 9.8 %
Neutrophils Absolute: 6.25 10*3/uL (ref 1.10–6.33)
Neutrophils: 62 %
Nucleated RBC: 0 /100 WBC (ref 0.0–0.0)
Platelets: 239 10*3/uL (ref 142–346)
RBC: 4.22 10*6/uL (ref 3.90–5.10)
RDW: 12 % (ref 11–15)
WBC: 10.08 10*3/uL — ABNORMAL HIGH (ref 3.10–9.50)

## 2020-06-24 LAB — IRON PROFILE
Iron Saturation: 37 % (ref 15–50)
Iron: 115 ug/dL (ref 40–145)
TIBC: 307 ug/dL (ref 265–497)
UIBC: 192 ug/dL (ref 126–382)

## 2020-06-24 LAB — HEMOLYSIS INDEX: Hemolysis Index: 12 (ref 0–24)

## 2020-06-24 LAB — IHS D-DIMER: D-Dimer: 0.24 ug/mL FEU (ref 0.00–0.60)

## 2020-06-24 NOTE — Telephone Encounter (Signed)
Patient has been scheduled for lab apt , orders linked , pt aware.

## 2020-06-25 ENCOUNTER — Encounter: Payer: Self-pay | Admitting: Family Nurse Practitioner

## 2020-06-25 ENCOUNTER — Ambulatory Visit: Payer: Medicare Other | Attending: Family Nurse Practitioner | Admitting: Family Nurse Practitioner

## 2020-06-25 ENCOUNTER — Other Ambulatory Visit: Payer: Self-pay

## 2020-06-25 VITALS — BP 118/73 | HR 75 | Temp 97.6°F | Resp 16 | Ht 64.0 in | Wt 140.0 lb

## 2020-06-25 DIAGNOSIS — D508 Other iron deficiency anemias: Secondary | ICD-10-CM | POA: Insufficient documentation

## 2020-06-25 NOTE — Progress Notes (Signed)
o9    OFFICE VISIT - [facility]        Regarding: rpv LUE provoked DVT , RUE DVT     HPI/HEMATOLOGY HISTORY: Ms. Bureau was seen today via video visit in hematology follow up.  She is a 75 y.o. female with history of DM, NICM, CKD, UE VTE events.    Patient was admitted 6/5-6/16/20 for VF cardiac arrest- underwent hypothermic protocol. s/p BiV ICD placement 08/26/18. She presented to [facility] 08/30/18 with LUE swelling. She was found to have acute subclavian DVT. She was started on Lovenox therapeutic. Her course was complicated by hematemesis on 09/01/18. EGD showed large duodenal ulcer with visible vessels s/p cauterization, clipping. Also noted to have RUE extensive R basilic occlusive thrombus and phlebitis 09/06/18. She resumed anticoagulation and was discharged on Lovenox 1mg /kg BID after stability of hemoglobin on a/c.     Switched to Eliquis 5mg  BID from Lovenox in September.     COVID vaccinated      INTERVAL HISTORY:     Seen by Dr. Zollie Pee in Vascular on 10/18 for progressing stenosis of R internal carotid artery with plans for repeat MRA although this was not completed due to claustrophobia.     Continues Eliquis 2.5mg  BID, ASA and statin. Taking Vitron-C PO every other day, tolerating well. Fatigued but denies palpitations, denies PICA.     Will see PCP soon to discuss elevated glucose, has been monitoring sugars at home (high 100s).    MEDICATIONS/ALLERGIES:     Outpatient Medications Marked as Taking for the 06/25/20 encounter (Office Visit) with Lajuana Carry, FNP   Medication Sig Dispense Refill    apixaban (Eliquis) 2.5 MG Take 1 tablet (2.5 mg total) by mouth every 12 (twelve) hours 180 tablet 3    aspirin EC 81 MG EC tablet Take 81 mg by mouth daily      atorvastatin (LIPITOR) 40 MG tablet Take 2 tablets (80 mg total) by mouth nightly 180 tablet 3    carvedilol (Coreg) 12.5 MG tablet Take 1 tablet (12.5 mg total) by mouth 2 (two) times daily with meals 180 tablet 3    glimepiride  (AMARYL) 2 MG tablet Take 2 mg by mouth daily      Iron-Vitamin C (VITRON-C PO) Take by mouth daily         Jardiance 25 MG tablet 25 mg every morning         metFORMIN (GLUCOPHAGE) 500 MG tablet Take 1 tablet (500 mg total) by mouth 2 (two) times daily Morning and night (Patient taking differently: Take 500 mg by mouth 2 (two) times daily with meals)      Multiple Vitamins-Minerals (CENTRUM PO) Take by mouth daily      sacubitril-valsartan (Entresto) 49-51 MG Tab per tablet Take 1 tablet by mouth 2 (two) times daily 180 tablet 3    vitamin B-12 (CYANOCOBALAMIN) 100 MCG tablet Take 1,000 mcg by mouth daily       Allergies   Allergen Reactions    Shellfish-Derived Products Anaphylaxis     PAST HISTORY:    Past Medical History:   Diagnosis Date    Anemia June 2020    Atrophic vaginitis 08/30/2018    Bilateral cataracts     I have implants    Cardiac arrest 08/17/2018    VF arrest. She received 3 shocks, 1 round of epi with successful ROSC. 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/2018    per patient, he only sees peripheral view    Diabetes mellitus 1992    Encounter for blood transfusion     Gout spondylitis     Gout synovitis     Hypertension     LBBB (left bundle branch block)     LBBB chronic    NICM (nonischemic cardiomyopathy) 08/2018    NICM, EF 20%    Respiratory failure 08/2018    Hypoxic/anoxic respiratory failure, extubated 08/20/2018    Type 2 diabetes mellitus, controlled     Viral cardiomyopathy 2008        Past Surgical History:   Procedure Laterality Date    COLONOSCOPY  2002    normal    EGD N/A 09/01/2018    Procedure: EGD;  Surgeon: Lilia Pro, MD;  Location: [facility];  Service: Gastroenterology;  Laterality: N/A;    EXPLORATORY LAPAROTOMY      EYE SURGERY      ICD IMPLANT BIV N/A 08/26/2018    Procedure: ICD IMPLANT BIV;  Surgeon: Len Childs, MD;   Location: [facility];  Service: Cardiovascular;  Laterality: N/A;  medtronic    INSERT / REPLACE / REMOVE PACEMAKER      Difibrillator August 26, 2018    LHC W/ CORONARY ANGIOS AND LV Left 08/21/2018    Procedure: LHC W/ CORONARY ANGIOS AND LV;  Surgeon: Burna Forts, MD;  Location: [facility];  Service: Cardiovascular;  Laterality: Left;    SMALL INTESTINE SURGERY  November 2014       Family History   Problem Relation Age of Onset    Cancer Father 67        bladder    Diabetes Father     Heart attack Father      Social History:   5 grandkids (15mo-19y/o)  Non smoker   Walks routinely     Patient Active Problem List   Diagnosis    Peritonitis    Type II diabetes mellitus without mention of complication    Gout synovitis    Type 2 diabetes mellitus without complications    Viral cardiomyopathy    CHF (congestive heart failure)    Cardiac arrest    Atrophic vaginitis    Chronic vulvitis    Arm DVT (deep venous thromboembolism), acute, left    Iron deficiency anemia    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   All other systems reviewed and negative except as above.   Gen/Constitutional: No fevers, no chills, no unintentional weight loss, + fatigue.  Eyes: No visual changes   ENT: no hearing loss, no nasal discharge, no epistaxis, no sore throat   Cdv: no chest pain, no palpitations  Resp: no cough, no shortness of breath, no wheezing  GI: no abdominal pain, no heartburn, no nausea/vomiting, no diarrhea, no constipation, no hematochezia, no melena   GU: no hematuria, no urinary frequency  Musculoskeletal: no joint pain, no joint swelling, no restricted motion, no peripheral edema, no varicosities   Skin: no rashes, no sores, no  blisters  Neuro: no numbness, no tingling, no  burning sensation  Psych: no nervousness, no anxiety, no depression  Endo: no heat/cold intolerance, no excessive thirst  Hem/Lymph: no bleeding (epistaxis, mucosal bleeding, hematemesis, hemoptysis, hematochezia, hematuria, melena), no easy bruising, no lymphadenopathy     PHYSICAL EXAMINATION  Vital Signs: BP 118/73 (BP Site: Right arm, Patient Position: Sitting, Cuff Size: Medium)    Pulse 75    Temp 97.6 F (36.4 C)    Resp 16    Ht 1.626 m (5\' 4" )    Wt 63.5 kg (140 lb)    SpO2 99%    BMI 24.03 kg/m        General Appearance:  Alert, cooperative, no distress, appears stated age   Head: Normocephalic, without obvious abnormality, atraumatic   Eyes:  Sclera anicteric, conjunctiva without pallor   Lungs:    Abdomen:   Lung sounds CTA b/l     Non-distended   Musculoskeletal: No gait abnormalities    Skin: Normal skin color, texture, no bleeding or ecchymoses, no rashes or lesions   Extremities: No UE or LE edema   Neurologic:  Mental Status: Alert and oriented x3, face symmetric, tongue midline.  Normal mood and affect, cooperative     LABS/RADIOLOGY:     No visits with results within 1 Day(s) from this visit.   Latest known visit with results is:   Appointment on 06/24/2020   Component Date Value Ref Range Status    WBC 06/24/2020 10.08 (A) 3.10 - 9.50 x10 3/uL Final    Hgb 06/24/2020 12.6  11.4 - 14.8 g/dL Final    Hematocrit 09/81/1914 38.1  34.7 - 43.7 % Final    Platelets 06/24/2020 239  142 - 346 x10 3/uL Final    RBC 06/24/2020 4.22  3.90 - 5.10 x10 6/uL Final    MCV 06/24/2020 90.3  78.0 - 96.0 fL Final    MCH 06/24/2020 29.9  25.1 - 33.5 pg Final    MCHC 06/24/2020 33.1  31.5 - 35.8 g/dL Final    RDW 78/29/5621 12  11 - 15 % Final    MPV 06/24/2020 10.0  8.9 - 12.5 fL Final    Neutrophils 06/24/2020 62.0  None % Final    Lymphocytes Automated 06/24/2020 24.1  None % Final    Monocytes 06/24/2020 9.8  None % Final    Eosinophils Automated  06/24/2020 3.1  None % Final    Basophils Automated 06/24/2020 0.7  None % Final    Immature Granulocytes 06/24/2020 0.3  None % Final    Nucleated RBC 06/24/2020 0.0  0.0 - 0.0 /100 WBC Final    Neutrophils Absolute 06/24/2020 6.25  1.10 - 6.33 x10 3/uL Final    Lymphocytes Absolute Automated 06/24/2020 2.43  0.42 - 3.22 x10 3/uL Final    Monocytes Absolute Automated 06/24/2020 0.99 (A) 0.21 - 0.85 x10 3/uL Final    Eosinophils Absolute Automated 06/24/2020 0.31  0.00 - 0.44 x10 3/uL Final    Basophils Absolute Automated 06/24/2020 0.07  0.00 - 0.08 x10 3/uL Final    Immature Granulocytes Absolute 06/24/2020 0.03  0.00 - 0.07 x10 3/uL Final    Absolute NRBC 06/24/2020 0.00  0.00 - 0.00 x10 3/uL Final    Iron 06/24/2020 115  40 - 145 ug/dL Final    UIBC 30/86/5784 192  126 - 382 ug/dL Final    TIBC 69/62/9528 307  265 - 497 ug/dL Final    Iron Saturation 06/24/2020 37  15 - 50 % Final  Ferritin 06/24/2020 53.60  4.60 - 204.00 ng/mL Final    D-Dimer 06/24/2020 0.24  0.00 - 0.60 ug/mL FEU Final    Hemolysis Index 06/24/2020 12  0 - 24 Final     LUE Doppler U/S 09/01/18: IMPRESSION:   Extensive occlusive and nonocclusive left upper extremity DVT in the  region of left pacer wire, as detailed. Per discussion with clinical  team at the time of dictation, there are aware of this finding.    RUE Doppler U/S 09/07/18: IMPRESSION:    Extensive right basilic occlusive thrombus in associated  thrombophlebitis. Continuous flow right cephalic vein. Other findings as  Above.    US Carotid dopp b/l 12/15/2019   Conclusion:  1. Right carotid system demonstrates severe plaque with 70-99% stenosis of  the internal carotid artery by Doppler criteria.    2. Left carotid system demonstrates severe plaque with 50-69% stenosis of  the internal carotid artery by Doppler criteria.    3. Normal antegrade flow in both vertebral arteries.    4. Compared to February 2021, there is similarly severe plaque R>L with  maringal  increase in PSV (from 236cm/s) on R but similar estimated  stenoses.  Findings  RCCA:   Patent. No significant plaque visible. No color flow turbulence.  RICA:   Patent. Severe irregular homogeneous plaque visible in the carotid          bulb and proximal internal carotid arteries. There is partial          acoustic shadowing which may limit Doppler sampling. Doppler          velocities are moderately elevated. Estimated stenosis is 70-99%.  RECA:   Patent. No significant plaque visible. No color flow turbulence.  RVERT:  Patent with normal antegrade flow.  LCCA:   Patent. Minimal smooth heterogeneous plaque visible. No color flow          turbulence.  LICA:   Patent. Severe irregular heterogeneous plaque visible in the          carotid bulb and proximal internal carotid artery obscured in          significant acoustic shadowing. Doppler velocities are mildly          elevated but may be underestimated due to shadowing limiting          sampling. Estimated stenosis is 50-69%.  LECA:   Patent. Minimal smooth homogeneous plaque visible. No color flow          turbulence.  LVERT:  Patent with normal antegrade flow    IMPRESSION/RECOMMENDATIONS: Ms. Gwinn is a 75 y.o. female with   No diagnosis found.  1. Provoked DVT: LUE provoked dvt from pacer wires and acute illness. RUE DVT provoked in setting of line. She has completed 3 months therapeutic anticoagulation. Reviewed newest ultrasound reports from 01/10/19- no evidence of dvt but some minor chronic changes bilaterally. I am inclined to use prophylactic dosage anticoagulation going forward as opposed to stopping a/c completed as the ICD lead wire may very well be at risk /nidus for further recurrent clot formation-- though alternately, the lead would likely be endothelialized and perhaps pose less of a risk than prior. Patient is in agreement with continuation of low dose anticoagulation.   -D Dimer nrm, last Cr 1.6 in October (stable, patient's baseline)  -Cont  Eliquis 2.5mg  BID    2. Iron defn anemia: history of IDA in setting of GI bleed.   - No anemia, Iron and ferritin nrm  -  Discontinue oral iron at this time  - Recheck iron studies and CBC in 1-2 mo to assess levels    Pt instructed to send non urgent messages via MyChart and provided with [facility] clinic phone number. RTC 6 months with Dr. Zollie Pee.     Signed by:     Lajuana Carry, FNP-BC  HEMATOLOGY

## 2020-07-30 ENCOUNTER — Encounter (INDEPENDENT_AMBULATORY_CARE_PROVIDER_SITE_OTHER): Payer: Self-pay | Admitting: Cardiovascular Disease

## 2020-07-30 ENCOUNTER — Ambulatory Visit (INDEPENDENT_AMBULATORY_CARE_PROVIDER_SITE_OTHER): Payer: Medicare Other | Admitting: Cardiovascular Disease

## 2020-07-30 VITALS — BP 110/70 | HR 77 | Wt 134.0 lb

## 2020-07-30 DIAGNOSIS — B3324 Viral cardiomyopathy: Secondary | ICD-10-CM

## 2020-07-30 NOTE — Progress Notes (Signed)
[facility] OFF[facility] PROGRESS NOTE    HRT [facility] OFFICE       HEART [facility] OFFICE -CARDIOLOGY  [address]  [place] [address]  Dept: [phone]  Dept Fax: [phone]       Patient Name: Tara Perry    Date of Visit:  Jul 30, 2020  Date of Birth: [birth year]  AGE: 75 y.o.  Medical Record #: [id number]  Requesting Physician: Tammi Sou, MD      CHIEF COMPLAINT: Cardiomyopathy      HISTORY OF PRESENT ILLNESS:    She is a pleasant 75 y.o. female who presents today for follow-up today for her known nonischemic cardiomyopathy.  Since her last visit on 01/28/2020, she has been doing fine from a cardiac standpoint, denies any paroxysmal nocturnal dyspnea, orthopnea, or leg edema.  Tolerating her cardiac medications well.  Unfortunately she has not been able to get her confirmatory MR angiogram for her carotid disease because she has severe claustrophobia.  She is still working on getting that done with the assistance of a benzodiazepine.    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 She has a past surgical history that includes Colonoscopy (2002); Exploratory laparotomy; LHC w/ Coronary Angios and LV (Left, 08/21/2018); ICD Implant BiV (N/A, 08/26/2018); EGD (N/A, 09/01/2018); Eye surgery; Small intestine surgery (November 2014); and Insert / replace / remove pacemaker.    ALLERGIES:   Allergies   Allergen Reactions   . Shellfish-Derived  Products Anaphylaxis       MEDICATIONS:     Current Outpatient Medications:   .  apixaban (Eliquis) 2.5 MG, Take 1 tablet (2.5 mg total) by mouth every 12 (twelve) hours, Disp: 180 tablet, Rfl: 3  .  aspirin EC 81 MG EC tablet, Take 81 mg by mouth daily, Disp: , Rfl:   .  atorvastatin (LIPITOR) 40 MG tablet, Take 2 tablets (80 mg total) by mouth nightly, Disp: 180 tablet, Rfl: 3  .  carvedilol (Coreg) 12.5 MG tablet, Take 1 tablet (12.5 mg total) by mouth 2 (two) times daily with meals, Disp: 180 tablet, Rfl: 3  .  glimepiride (AMARYL) 2 MG tablet, Take 2 mg by mouth daily, Disp: , Rfl:   .  Jardiance 25 MG tablet, 25 mg every morning  , Disp: , Rfl:   .  metFORMIN (GLUCOPHAGE) 500 MG tablet, Take 1 tablet (500 mg total) by mouth 2 (two) times daily Morning and night (Patient taking differently: Take 500 mg by mouth 2 (two) times daily with meals), Disp: , Rfl:   .  Multiple Vitamins-Minerals (CENTRUM PO), Take by mouth daily, Disp: , Rfl:   .  sacubitril-valsartan (Entresto) 49-51 MG Tab per tablet, Take 1 tablet by mouth 2 (two) times daily, Disp: 180 tablet, Rfl: 3  .  vitamin B-12 (CYANOCOBALAMIN) 100 MCG tablet, Take 1,000 mcg by mouth daily, Disp: , Rfl:      FAMILY HISTORY: family history includes Cancer (age of onset: 82) in her father; Diabetes in her father; Heart attack in her father.    SOCIAL HISTORY: She reports that she has never smoked. She has  never used smokeless tobacco. She reports previous alcohol use. She reports that she does not use drugs.    PHYSICAL EXAMINATION    Visit Vitals  BP 110/70 (BP Site: Left arm, Patient Position: Sitting)   Pulse 77   Wt 60.8 kg (134 lb)   BMI 23.00 kg/m       General Appearance:  A well-appearing female in no acute distress.    Skin: Warm and dry to touch, no apparent skin lesions, or masses noted.  Head: Normocephalic, normal hair pattern, no masses or tenderness   Eyes: EOMS Intact, PERRL, conjunctivae and lids unremarkable.  ENT: Ears, Nose and throat  reveal no gross abnormalities.  No pallor or cyanosis.  Dentition good.   Neck: JVP normal, no carotid bruit, thyroid not enlarged   Chest: Clear to auscultation bilaterally with good air movement and respiratory effort and no wheezes, rales, or rhonchi   Cardiovascular: Regular rhythm, S1 normal, S2 normal, No S3 or S4, Apical impulse not displaced. No murmurs. No gallops or rubs detected   Abdomen: Soft, nontender, nondistended, with normoactive bowel sounds. No organomegaly.  No pulsatile masses, or bruits.   Extremities: Warm without edema. No clubbing, or cyanosis. All peripheral pulses are full and equal.   Neuro: Alert and oriented x3. No gross motor or sensory deficits noted, affect appropriate.        ECG: Not performed today    LABS:   No results found for: CBC  Lab Results   Component Value Date    AST 18 12/24/2019    ALT 14 12/24/2019     No results found for: LIPID  Lab Results   Component Value Date    HGBA1C 6.5 (H) 08/24/2018    BNP 139 (H) 08/16/2018    TSH 1.06 09/13/2012           IMPRESSION:   Ms. Earll is a 75 y.o. female with the following problems:    . Chronic systolic heart failure, NYHA class II, presumed dry weight 128 pounds  a. Echocardiogram 08/19/2018: EF 20%, dyskinesis of the basal/mid anterior, anteroseptal, and inferoseptal LV, moderate hypokinesis of the basal inferior wall, normal RV function, down from 40% on October 2017 echo  b. Echocardiogram 11/05/2018: EF 40%  . Nonischemic cardiomyopathy, s/p cardiac catheterization 08/21/2018 with no CAD  a. Prior intolerance to spironolactone/MRAs  . Prior ventricular fibrillation arrest, witnessed, hospitalized 08/16/2018 through 08/27/2018, successfully resuscitated by EMS, intubated, underwent successful therapeutic hypothermia protocol then extubated, s/p biventricular AICD placed 08/26/2018  . Prior left upper extremity DVT requiring hospitalization 08/30/2018 through 09/08/2018 with anticoagulation at that time complicated by hematemesis  requiring endoscopy showing nonbleeding duodenal ulcer treated with epinephrine and cautery/clipping  . Hypertension  . Hypercholesterolemia, on statin therapy  . Chronic left bundle branch block  . Type 2 diabetes  . Chronic kidney disease  . Bilateral carotid artery disease  a. Carotid duplex study 03/08/2018: Bilateral 50 to 69% ICA disease  b. Carotid duplex 05/07/2019: 70-99% right ICA, 50 to 69% left ICA  c. Possible symptomatic TIA 05/19/2019 (left facial numbness leading to ER visit)  d. MRA 06/16/2019: 75% right ICA, 40% left ICA  e. Carotid duplex 12/15/2019: 70-99% right ICA, 50 to 69% left ICA      RECOMMENDATIONS:    . Doing well.  She is due to obtain an MR angiogram of her carotid arteries to further assess her carotid disease prior to visiting with vascular surgery (Dr. Marcell Anger) to assess whether  to proceed with carotid endarterectomy.  . Continue current guideline directed heart failure therapy for her nonischemic cardiomyopathy.  No MRAs since she had a prior significant intolerance and does not want to resume it.  We will continue Entresto, carvedilol, Jardiance, atorvastatin, aspirin, and Eliquis.  . Follow-up in 6 months time                                                     Orders Placed This Encounter   Procedures   . ECG 12 lead   . APP Office Visit ([facility])       No orders of the defined types were placed in this encounter.      SIGNED:    Donnie Mesa, MD          This note was generated by the Dragon speech recognition and may contain errors or omissions not intended by the user. Grammatical errors, random word insertions, deletions, pronoun errors, and incomplete sentences are occasional consequences of this technology due to software limitations. Not all errors are caught or corrected. If there are questions or concerns about the content of this note or information contained within the body of this dictation, they should be addressed directly with the author for clarification.

## 2020-08-03 ENCOUNTER — Other Ambulatory Visit (FREE_STANDING_LABORATORY_FACILITY): Payer: Medicare Other

## 2020-08-03 DIAGNOSIS — D508 Other iron deficiency anemias: Secondary | ICD-10-CM

## 2020-08-03 LAB — COMPREHENSIVE METABOLIC PANEL
ALT: 12 U/L (ref 0–55)
AST (SGOT): 16 U/L (ref 5–34)
Albumin/Globulin Ratio: 1.3 (ref 0.9–2.2)
Albumin: 3.7 g/dL (ref 3.5–5.0)
Alkaline Phosphatase: 95 U/L (ref 37–117)
Anion Gap: 8 (ref 5.0–15.0)
BUN: 22 mg/dL — ABNORMAL HIGH (ref 7.0–19.0)
Bilirubin, Total: 0.4 mg/dL (ref 0.2–1.2)
CO2: 21 mEq/L (ref 21–29)
Calcium: 9.2 mg/dL (ref 7.9–10.2)
Chloride: 107 mEq/L (ref 100–111)
Creatinine: 1.5 mg/dL (ref 0.4–1.5)
Globulin: 2.8 g/dL (ref 2.0–3.6)
Glucose: 322 mg/dL — ABNORMAL HIGH (ref 70–100)
Potassium: 4.2 mEq/L (ref 3.5–5.1)
Protein, Total: 6.5 g/dL (ref 6.0–8.3)
Sodium: 136 mEq/L (ref 136–145)

## 2020-08-03 LAB — CBC AND DIFFERENTIAL
Absolute NRBC: 0 10*3/uL (ref 0.00–0.00)
Basophils Absolute Automated: 0.05 10*3/uL (ref 0.00–0.08)
Basophils Automated: 0.6 %
Eosinophils Absolute Automated: 0.34 10*3/uL (ref 0.00–0.44)
Eosinophils Automated: 4.1 %
Hematocrit: 32.7 % — ABNORMAL LOW (ref 34.7–43.7)
Hgb: 11.1 g/dL — ABNORMAL LOW (ref 11.4–14.8)
Immature Granulocytes Absolute: 0.02 10*3/uL (ref 0.00–0.07)
Immature Granulocytes: 0.2 %
Lymphocytes Absolute Automated: 1.71 10*3/uL (ref 0.42–3.22)
Lymphocytes Automated: 20.6 %
MCH: 30.3 pg (ref 25.1–33.5)
MCHC: 33.9 g/dL (ref 31.5–35.8)
MCV: 89.3 fL (ref 78.0–96.0)
MPV: 10.1 fL (ref 8.9–12.5)
Monocytes Absolute Automated: 0.61 10*3/uL (ref 0.21–0.85)
Monocytes: 7.3 %
Neutrophils Absolute: 5.57 10*3/uL (ref 1.10–6.33)
Neutrophils: 67.2 %
Nucleated RBC: 0 /100 WBC (ref 0.0–0.0)
Platelets: 228 10*3/uL (ref 142–346)
RBC: 3.66 10*6/uL — ABNORMAL LOW (ref 3.90–5.10)
RDW: 12 % (ref 11–15)
WBC: 8.3 10*3/uL (ref 3.10–9.50)

## 2020-08-03 LAB — IRON PROFILE
Iron Saturation: 36 % (ref 15–50)
Iron: 96 ug/dL (ref 40–145)
TIBC: 267 ug/dL (ref 265–497)
UIBC: 171 ug/dL (ref 126–382)

## 2020-08-03 LAB — HEMOLYSIS INDEX
Hemolysis Index: 4 (ref 0–24)
Hemolysis Index: 6 (ref 0–24)

## 2020-08-03 LAB — GFR: EGFR: 33.8

## 2020-08-03 LAB — FERRITIN: Ferritin: 101.3 ng/mL (ref 4.60–204.00)

## 2020-08-04 ENCOUNTER — Telehealth: Payer: Self-pay | Admitting: Family Nurse Practitioner

## 2020-08-04 NOTE — Telephone Encounter (Signed)
LMTCB to coordinate f/u appt in October, currently placed on wait list.

## 2020-08-04 NOTE — Telephone Encounter (Signed)
LVM with patient to discuss recent lab results. She has recurrent mild anemia (hgb 11.1), likely 2/2 CKD (Cr 1.5, GFR 33.8). Previously with iron deficiency anemia however now resolved,  Recent iron studies WNL. Glucose elevated, on metformin. She can remain off oral iron for now.     Front desk: please call patient to schedule f/u with Dr. Zollie Pee in October.

## 2020-08-10 ENCOUNTER — Emergency Department
Admission: EM | Admit: 2020-08-10 | Discharge: 2020-08-10 | Disposition: A | Discharge status: Home / Self Care | Payer: Medicare Other | Attending: Emergency Medicine | Admitting: Emergency Medicine

## 2020-08-10 DIAGNOSIS — H9202 Otalgia, left ear: Secondary | ICD-10-CM | POA: Insufficient documentation

## 2020-08-10 NOTE — Discharge Instructions (Signed)
Dear [name]:    Thank you for choosing the [facility] Emergency Department, the premier emergency department in the [place] area.  I hope your visit today was EXCELLENT. You will receive a survey via text message that will give you the opportunity to provide feedback to your team about your visit. Please do not hesitate to reach out with any questions!    Specific instructions for your visit today:      IF YOU DO NOT CONTINUE TO IMPROVE OR YOUR CONDITION WORSENS, PLEASE CONTACT YOUR DOCTOR OR RETURN IMMEDIATELY TO THE EMERGENCY DEPARTMENT.    Sincerely,  Tara Perry, Tara Patten, MD  Attending Emergency Physician  [facility] Emergency Department      OBTAINING A PRIMARY CARE APPOINTMENT    Primary care physicians (PCPs, also known as primary care doctors) are either internists or family medicine doctors. Both types of PCPs focus on health promotion, disease prevention, patient education and counseling, and treatment of acute and chronic medical conditions.    If you need a primary care doctor, please call the below number and ask who is receiving new patients.     [facility]  Telephone:  [phone]  [email or web address]    DOCTOR REFERRALS  Call [phone] (available 24 hours a day, 7 days a week) if you need any further referrals and we can help you find a primary care doctor or specialist.  Also, available online at:  [email or web address]    YOUR CONTACT INFORMATION  Before leaving please check with registration to make sure we have an up-to-date contact number.  You can call registration at [phone] to update your information.  For questions about your hospital bill, please call [phone].  For questions about your Emergency Dept Physician bill please call [phone].      FREE HEALTH SERVICES  If you need help with health or social services, please call 2-1-1 for a free referral to resources in your area.  2-1-1 is a free service connecting people with  information on health insurance, free clinics, pregnancy, mental health, dental care, food assistance, housing, and substance abuse counseling.  Also, available online at:  http://www.[place].org    ORTHOPEDIC INJURY   Please know that significant injuries can exist even when an initial x-ray is read as normal or negative.  This can occur because some fractures (broken bones) are not initially visible on x-rays.  For this reason, close outpatient follow-up with your primary care doctor or bone specialist (orthopedist) is required.    MEDICATIONS AND FOLLOWUP  Please be aware that some prescription medications can cause drowsiness.  Use caution when driving or operating machinery.    The examination and treatment you have received in our Emergency Department is provided on an emergency basis, and is not intended to be a substitute for your primary care physician.  It is important that your doctor checks you again and that you report any new or remaining problems at that time.      ASSISTANCE WITH INSURANCE    Affordable Care Act  [facility])  Call to start or finish an application, compare plans, enroll or ask a question.  [phone]  TTY: [phone]  Web:  Healthcare.gov    Help Enrolling in [facility]  [employer] [place]  336-065-0247 (TOLL-FREE)  315-030-0398 (TTY)  Web:  Http://www.coverva.org    Local Help Enrolling in the [facility]  [place] [facility]  [phone] (MAIN)  Email:  health-help@nvfs .org  Web:  BlackjackMyths.is  Address:  [address], [address] [place],  [address]    SEDATING MEDICATIONS  Sedating medications include strong pain medications (e.g. narcotics), muscle relaxers, benzodiazepines (used for anxiety and as muscle relaxers), Benadryl/diphenhydramine and other antihistamines for allergic reactions/itching, and other medications.  If you are unsure if you have received a sedating medication, please ask your physician or nurse.  If you received a sedating  medication: DO NOT drive a car. DO NOT operate machinery. DO NOT perform jobs where you need to be alert.  DO NOT drink alcoholic beverages while taking this medicine.     If you get dizzy, sit or lie down at the first signs. Be careful going up and down stairs.  Be extra careful to prevent falls.     Never give this medicine to others.     Keep this medicine out of reach of children.     Do not take or save old medicines. Throw them away when outdated.     Keep all medicines in a cool, dry place. DO NOT keep them in your bathroom medicine cabinet or in a cabinet above the stove.    MEDICATION REFILLS  Please be aware that we cannot refill any prescriptions through the ER. If you need further treatment from what is provided at your ER visit, please follow up with your primary care doctor or your pain management specialist.    FREESTANDING EMERGENCY DEPARTMENTS OF [facility]  Did you know [facility] has two freestanding ERs located just a few miles away?  [facility] [place] and [facility] [place] have short wait times, easy free parking directly in front of the building and top patient satisfaction scores - and the same Board Certified Emergency Medicine doctors as  [facility].

## 2020-08-10 NOTE — ED Provider Notes (Signed)
[facility] EMERGENCY DEPARTMENT APP H&P         CLINICAL SUMMARY          Diagnosis:    .     Final diagnoses:   Posterior auricular pain of left ear         MDM Notes:      Tara Perry is a 75 y.o. female presents with intermittent pain and burning sensation just behind her left ear on and off the last month or so. Patient denies associated ear pain. Denies associated dizziness. Reports the pain felt more intense today which prompted her visit to the ED.  Physical exam as below  discussed possible etiologies for pain, lower suspicion for shingles, mastitis, otitis media based on exam. Patient not experiencing dizziness or other neurologic symptoms, lower concern for dissection. Recommended close follow-up with cardiology regarding her symptoms. She has follow up scheduled with vascular surgeon for MR angiogram of her carotid arteries  Will also provide ENT referral  Advised patient to return with new or worsening symptoms  Plan for discharge      Discussed with attending MD Irena Cords      Counseling: I have spoken with the patient and discussed today's findings, in addition to providing specific details for the plan of care. Questions are answered and the patient agrees with the plan. Patient advised to return to ED with new or worsening symptoms.    This patient was seen and evaluated during the SARS-CoV-2 pandemic.         Disposition:       ED Disposition     ED Disposition   Discharge    Condition   --    Date/Time   Tue Aug 10, 2020  6:13 PM    Comment   REATHER STELLER discharge to home/self care.    Condition at disposition: Stable               Discharge         Discharge Prescriptions     None                      CLINICAL INFORMATION        HPI:      Chief Complaint: Otalgia  .    Tara Perry is a 75 y.o. female with h/o cardiac arrest, DM, CKD, HTN, LBBB, carotid stenosis who presents with left posterior ear pain on and off for 2-3 years  Reports she will get a twinge of pain behind her left  ear that it is brief with associated burning sensation. She initially thought it was just irritation from her glasses sitting on that part of the ear. Today the pain felt a little bit worse which prompted her visit to the ED. Reports she previously came to the ED for her symptoms around 2 years ago and had CT which was unremarkable. Denies any ear pain. Denies dizziness. Denies associated weakness, neck pain, tinnitus or visual changes. Denies any fevers or chills. Denies any rash      History obtained from: Patient      ROS:      Pertinent positive and negative review of systems elements noted in the HPI.  All other systems reviewed and negative.        Physical Exam:      Pulse 97  BP 177/72  Resp 20  SpO2 98 %  Temp 97.7 F (36.5 C)    Physical  Exam    CONSTITUTIONAL: Well-appearing; well-nourished; in no apparent distress.  HEAD: Normocephalic; atraumatic.  EYES: Pupils are round and reactive, extra-ocular muscles are intact. Sclera are anicteric.  ENT: Normal nose; no rhinorrhea. Patient is speaking clearly, not stridorous or muffled and airway is  Intact.  Left ear: No pain with manipulation of the pinna. No tragal tenderness. External ear canal unremarkable. TM visualized without bulging or erythema. Mastoid without swelling, erythema. Minimal tenderness noted on exam just posterior to the ear. No associated rash.  NECK: Supple; normal range of motion.   RESPIRATORY: Normal chest excursion with respiration; normal respiratory effort.  CARDIOVASCULAR: Regular rate, normal peripheral perfusion.  GI: Non-distended.  BACK: No evidence of trauma or deformity. Normal ROM.  PELVIS: No evidence of trauma or deformity.  EXT: Normal ROM in all four extremities; non-tender to palpation; distal pulses are normal.  SKIN: Normal for age and race; warm; dry; good turgor; no apparent rash.  NEURO: A & O x 4; moves all extremities well, no focal motor deficits.  PSYCHOLOGICAL: The patients mood and manner are  appropriate. Grooming and personal  hygiene are appropriate. No apparent thoughts of harm to self or others.              PAST HISTORY        Primary Care Provider: Tammi Sou, MD        PMH/PSH:    .     Past Medical History:   Diagnosis Date   . Anemia June 2020   . 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 Bilateral cataracts     I have implants   . 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 She received 3 shocks, 1 round of epi with successful ROSC. 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   . 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 CKD (chronic kidney disease), stage III    . Congestive heart failure    . 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 Diabetes mellitus 1992   . Encounter for blood transfusion    . Gout spondylitis    . Gout synovitis    . Hypertension    . LBBB (left bundle branch block)     LBBB chronic   . NICM (nonischemic cardiomyopathy) 08/2018    NICM, EF 20%   . Respiratory failure 08/2018    Hypoxic/anoxic respiratory failure, extubated 08/20/2018   . Type 2 diabetes mellitus, controlled    . Viral cardiomyopathy 2008       She has a past surgical history that includes Colonoscopy (2002); Exploratory laparotomy; LHC w/ Coronary Angios and LV (Left, 08/21/2018); ICD Implant BiV (N/A, 08/26/2018); EGD (N/A, 09/01/2018); Eye surgery; Small intestine surgery (November 2014); and Insert / replace / remove pacemaker.      Social/Family History:      She reports that she has never smoked. She has never used smokeless tobacco. She reports previous alcohol use. She reports that she does not use drugs.    Family History   Problem Relation Age of Onset   . Cancer Father 42        bladder   . Diabetes Father    . Heart attack Father          Listed Medications on Arrival:    .     Discharge Medication List as of 08/10/2020  6:27 PM      CONTINUE these medications which have NOT CHANGED  Details   apixaban (Eliquis) 2.5 MG Take 1 tablet (2.5 mg total) by mouth every 12 (twelve) hours, Starting Wed  01/28/2020, E-Rx      aspirin EC 81 MG EC tablet Take 81 mg by mouth daily, Historical Med      atorvastatin (LIPITOR) 40 MG tablet Take 2 tablets (80 mg total) by mouth nightly, Starting Wed 01/28/2020, E-Rx      carvedilol (Coreg) 12.5 MG tablet Take 1 tablet (12.5 mg total) by mouth 2 (two) times daily with meals, Starting Wed 01/28/2020, E-Rx      glimepiride (AMARYL) 2 MG tablet Take 2 mg by mouth daily, Starting Tue 03/04/2019, Historical Med      Jardiance 25 MG tablet 25 mg every morning   , Starting Fri 11/22/2018, Historical Med      metFORMIN (GLUCOPHAGE) 500 MG tablet Take 1 tablet (500 mg total) by mouth 2 (two) times daily Morning and night, Starting Wed 08/28/2018, No Print      Multiple Vitamins-Minerals (CENTRUM PO) Take by mouth daily, Historical Med      sacubitril-valsartan (Entresto) 49-51 MG Tab per tablet Take 1 tablet by mouth 2 (two) times daily, Starting Wed 01/28/2020, E-Rx      vitamin B-12 (CYANOCOBALAMIN) 100 MCG tablet Take 1,000 mcg by mouth daily, Historical Med            Allergies: She is allergic to shellfish-derived products.            VISIT INFORMATION        Clinical Course in the ED:               Medications Given in the ED:    .     ED Medication Orders (From admission, onward)    None            Procedures:      Procedures                    RESULTS        Lab Results:      Results     ** No results found for the last 24 hours. **              Radiology Results:      No orders to display               Scribe Attestation:      No scribe involved in the care of this patient            Ronnald Nian, [place]  08/10/20 1900

## 2020-08-11 NOTE — Telephone Encounter (Signed)
The patient has been scheduled for 12/22/2020 at 10:30 with Dr. Zollie Pee. Patient is aware of time and location of the appointment.

## 2020-08-11 NOTE — ED Provider Notes (Signed)
Date Time: 08/11/20 12:16 AM   Patient Name: Tara Perry   Attending Physician: Almira Bar, M.D.    Attending Note:   The patient was seen and examined by the physician's assistant or nurse practitioner, and the plan of care was discussed with me. I agree with the plan as it was presented to me.   I have seen and evaluated this patient.  Selected historical findings: ULAH OLMO 75 y.o. female with PMH vfib cardiac arrest s/p AICD, DM, CKD, HTN, carotid stenosis who presents with left posterior ear pain.   Pt admit to hx of intermittent dull burning nonradiating posterior L ear pain for 2-3 years  Reports she will get a twinge of pain behind her left ear that it is brief with associated burning sensation. She initially thought it was just irritation from her glasses sitting on that part of the ear. Today the pain felt a little bit worse which prompted her visit to the ED. However pain improved in ED.   Reports she previously came to the ED for her symptoms around 2 years ago and had CT which was unremarkable.   Denies any inner or focal ear pain or drainage. Ear or posterior ear trauma or lesions/wounds/rash/redness. Denies dizziness, HA, neck pain or injury, focal arm numbness or weakness, tinnitus or visual changes. Denies any fevers or chills.     Compliant with eliquis and has known carotid stenosis and follow up with vascular surgery and MR angio coming up.     ROS: Positive and negative ROS elements as per HPI.  All other systems reviewed and negative.    PMH/PSH/FH: Reviewed in chart and verified by me.     Physical examination:    Pulse 97  BP 177/72  Resp 20  SpO2 98 %  Temp 97.7 F (36.5 C)  Well appearing, NAD, well developed, normocephalic, atraumatic, EOMI, PERRL, no midline spinal ttp, FROM of neck.   Selected findings:  External left ear normal in appearance, no focal ear ttp or edema. No pain with manipulation of the pinna. Normal ear canal. Mild ttp to posterior right ear over bony  region. No overlying erythema, edema, lesions or bony deformity. No mastoid ttp or edema.    MDM: Tara Perry 75 y.o. female with PMH vfib cardiac arrest s/p AICD, DM, CKD, HTN, carotid stenosis who presents with left posterior ear pain.   VSS. Exam as above. Pt well appearing and benign ear exam. Mild ttp in L posterior ear region, no mastoid ttp. No sign of infection, shingles or edema.   Pt denies dizziness, tinnitus, neck pain or HA, syncope or near syncope. Unlikely carotid dissection or new occlusion.   Unclear cause of pain, possibly neuropathic pain given description of pain as burning and it has been ongoing.   Pain controlled in ED.   Stable for [place] with plan for close PMD, cardiology follow up, and pt has planned MR angio of carotids planned neck week with vascular surgery follow up.   Pt given referral to ENT for further eval.   Pt and son given strict return precautions.   Patient agrees with plan, follow up and understands return precautions.    O2 sat-           saturation: 98 %; Oxygen use: room air; Interpretation: Normal      Clinical Course:     ____________________________________________________________________    No scribe was used for this chart  ____________________________________________________________________   Note: parts of  this note were generated by the Epic EMR system/ Dragon speech recognition and may contain inherent errors or omissions not intended by the user. Grammatical errors, random word insertions, deletions, pronoun errors and incomplete sentences are occasional consequences of this technology due to software limitations. Not all errors are caught or corrected. If there are questions or concerns about the content of this note or information contained within the body of this dictation they should be addressed directly with the author for clarification.     Irena Cords, Alycia Patten, MD  08/11/20 [place]

## 2020-09-05 DIAGNOSIS — Z95810 Presence of automatic (implantable) cardiac defibrillator: Secondary | ICD-10-CM

## 2020-09-07 ENCOUNTER — Other Ambulatory Visit (INDEPENDENT_AMBULATORY_CARE_PROVIDER_SITE_OTHER): Payer: Medicare Other | Admitting: Cardiovascular Disease

## 2020-09-07 LAB — REMOTE CARDIAC DEVICE MONITORING
AF Burden Percentage: 0
ICD Shock Recent Count: 0
RV Pacing Percentage: 1.34

## 2020-12-02 ENCOUNTER — Encounter (INDEPENDENT_AMBULATORY_CARE_PROVIDER_SITE_OTHER): Payer: Self-pay | Admitting: Physician Assistant

## 2020-12-02 DIAGNOSIS — Z95810 Presence of automatic (implantable) cardiac defibrillator: Secondary | ICD-10-CM | POA: Insufficient documentation

## 2020-12-02 NOTE — Progress Notes (Signed)
Cardiovascular Implantable Electronic Devices (CIED) MRI Order Documentation    Date of Order: December 02, 2020  Patient Name: Tara Perry, Tara Perry  Date of Birth: 31-Mar-1945  Medical Record #:  [id number]    Account#:  [id number]    CIED Information  Manufacturer: Medtronic Model: Huel Coventry CRT-D MRI   Indication for CIED:  NICM, Secondary prevention for VF arrest  Date of last interrogation: 28 Jun 22   Location of CIED: Left Pectoral    Full system MRI conditional: yes  Battery life at EOL/ERI:  no  Leads implanted >6 weeks (all companies, except [employer] who do not need 6 week wait):  yes  Evidence of fractured/compromised leads: no  Pacing thresholds <2.5V @ <0.18ms: yes     Pacing Mode for MRI: DOO   Pacing Rate for MRI: 75    MRI timeout (if applicable): n/a      Signed by:   Karis Juba PhD, DrPH, DMSc, PA-C   Cardiac Electrophysiology   [facility]

## 2020-12-05 DIAGNOSIS — Z95810 Presence of automatic (implantable) cardiac defibrillator: Secondary | ICD-10-CM

## 2020-12-07 ENCOUNTER — Other Ambulatory Visit (INDEPENDENT_AMBULATORY_CARE_PROVIDER_SITE_OTHER): Payer: Medicare Other | Admitting: Cardiovascular Disease

## 2020-12-07 LAB — REMOTE CARDIAC DEVICE MONITORING
AF Burden Percentage: 0
ICD Shock Recent Count: 0
RV Pacing Percentage: 0.89

## 2020-12-13 ENCOUNTER — Ambulatory Visit
Admission: RE | Admit: 2020-12-13 | Discharge: 2020-12-13 | Disposition: A | Discharge status: Home / Self Care | Payer: Medicare Other | Source: Ambulatory Visit | Attending: Specialist | Admitting: Specialist

## 2020-12-13 DIAGNOSIS — I6523 Occlusion and stenosis of bilateral carotid arteries: Secondary | ICD-10-CM | POA: Insufficient documentation

## 2020-12-13 MED ORDER — SODIUM CHLORIDE (PF) 0.9 % IJ SOLN
3.0000 mg/kg | Freq: Once | INTRAVENOUS | Status: AC
Start: 2020-12-13 — End: 2020-12-13
  Administered 2020-12-13: 189 mg via INTRAVENOUS
  Filled 2020-12-13: qty 6.3

## 2020-12-15 ENCOUNTER — Telehealth (INDEPENDENT_AMBULATORY_CARE_PROVIDER_SITE_OTHER): Payer: Self-pay | Admitting: Specialist

## 2020-12-15 ENCOUNTER — Encounter (INDEPENDENT_AMBULATORY_CARE_PROVIDER_SITE_OTHER): Payer: Self-pay

## 2020-12-15 NOTE — Progress Notes (Signed)
THIS FORM IS FOR INTERNAL USE FOR VASCULAR SURGERY AND IS NOT A PROGRESS NOTE      Room:         Appt Date/Time: 12/20/2020 @     75 y.o. female   PCP: Tammi Sou, MD  Referring Physician:   N/A       Appointment Type: Follow-Up  Imaging: Outside Imaging-MRA Neck 12/13/2020 @ [facility] scanned in epic     Chief Complaint/HPI: F/U to progressing stenosis of the right internal carotid artery-Review MRA results.  Date of Last Visit:  12/29/2019    Allergies:   Allergies   Allergen Reactions    Shellfish-Derived Products Anaphylaxis         Selected Medications:  Aspirin, Atorvastatin, Eliquis, Metformin, and Glimepiride,Jardiance     Focused Prior Medical History: HTN, Diabetes, Kidney Disease, DVT, and CHF             Smoker           Former Smoker     X     Never Smoker      Vital Signs this Visit Pulses  HT  BP R BP L Temp   Rad Ulnar Brach Fem Pop DP PT        R          WT  Pulse R Pulse L SpO2       L          Imaging results:        Plan of Care:

## 2020-12-15 NOTE — Telephone Encounter (Signed)
Spoke with patient. Dr. Zollie Pee would like to see her as soon as possible. MRI in epic. She says she will give [place] a call back. Holding time on 10/10 at 12:45 at [facility].    [place]  Cardiac connect

## 2020-12-16 NOTE — Progress Notes (Signed)
Vascular Surgery      Chief Complaint   Patient presents with    Follow-up     F/U progressing Right internal carotid artery stenosis, Review MRA results        History of Present Illness     Tara Perry is a 75 y.o. female who returns the office for follow-up evaluation.  This appointment is to review a MRA to evaluate progressive right internal carotid artery disease.     She reports known asymptomatic carotid stenosis that has been monitored for the last few years until  worsening on carotid duplex which prompted additional imaging with MRA on 06/17/2019 that revealed a 75% right internal carotid stenosis, however there was significant motion artifact.  She has known CKD stage III with recent GFR of 31 and therefore CTA was not recommended.      She was followed closely with carotid duplex. At her prior office visit in October 2021, duplex demonstrated increased peak systolic velocity in the right internal carotid artery to 254 cm/s.  This is increased from 236 cm/s in the duplex performed in 05/07/2019. Additionally the ICA to CCA ratio has increased to 2.9, from 2.5 in 04/2019    In the interval, she reports MRA completion was delayed due to her severe claustrophobia. She also relates a history of left ear pain that is intermittent in nature and relieved with warm compress and Tylenol. She was evaluated in the ED in May 2022 because pain extended from the left posterior ear to the left cheek. She has a history of tinnitus of the left ear since 2021. She denies any amarousis fugax, unilateral weakness, slurred speech, or facial droop. No imaging performed during her May 2022 ED visit. She has not been evaluated by ENT.    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  She has history of DVT in the left upper extremity after its placement and has been on Eliquis therapy.    Other past medical history includes diabetes mellitus, gout and cardiomyopathy.    MRA  performed on 12/14/2020 demonstrated extensive atherosclerotic plaque right carotid bifurcation resulting in approximately 80-90% stenosis of the proximal right internal carotid artery. Moderate atherosclerotic plaque at the left carotid bifurcation resulting in approximately 60-70% stenosis.     Past Medical History     Past Medical History:   Diagnosis Date    Anemia June 2020    Atrophic vaginitis 08/30/2018    Bilateral cataracts     I have implants    Cardiac arrest 08/17/2018    VF arrest. She received 3 shocks, 1 round of epi with successful ROSC. 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    Type 2 diabetes mellitus, controlled     Viral cardiomyopathy 2008       Past  Surgical History:   Procedure Laterality Date    COLONOSCOPY  2002    normal    EGD N/A 09/01/2018    Procedure: EGD;  Surgeon: Lilia Pro, MD;  Location: [facility];  Service: Gastroenterology;  Laterality: N/A;    EXPLORATORY LAPAROTOMY      EYE SURGERY      ICD IMPLANT BIV N/A 08/26/2018    Procedure: ICD IMPLANT BIV;  Surgeon: Len Childs, MD;  Location: [facility];  Service: Cardiovascular;  Laterality: N/A;  medtronic    INSERT / REPLACE / REMOVE PACEMAKER      Difibrillator August 26, 2018    LHC W/ CORONARY ANGIOS AND LV Left 08/21/2018    Procedure: LHC W/ CORONARY ANGIOS AND LV;  Surgeon: Burna Forts, MD;  Location: [facility];  Service: Cardiovascular;  Laterality: Left;    SMALL INTESTINE SURGERY  November 2014       Allergies     Allergies    Allergen Reactions    Shellfish-Derived Products Anaphylaxis       Medications       Current Outpatient Medications:     apixaban (Eliquis) 2.5 MG, Take 1 tablet (2.5 mg total) by mouth every 12 (twelve) hours, Disp: 180 tablet, Rfl: 3    aspirin EC 81 MG EC tablet, Take 81 mg by mouth daily, Disp: , Rfl:     atorvastatin (LIPITOR) 40 MG tablet, Take 2 tablets (80 mg total) by mouth nightly, Disp: 180 tablet, Rfl: 3    carvedilol (Coreg) 12.5 MG tablet, Take 1 tablet (12.5 mg total) by mouth 2 (two) times daily with meals, Disp: 180 tablet, Rfl: 3    glimepiride (AMARYL) 2 MG tablet, Take 2 mg by mouth daily, Disp: , Rfl:     Jardiance 25 MG tablet, 25 mg every morning  , Disp: , Rfl:     metFORMIN (GLUCOPHAGE) 500 MG tablet, Take 1 tablet (500 mg total) by mouth 2 (two) times daily Morning and night (Patient taking differently: Take 500 mg by mouth 2 (two) times daily with meals), Disp: , Rfl:     Multiple Vitamins-Minerals (CENTRUM PO), Take by mouth daily, Disp: , Rfl:     sacubitril-valsartan (Entresto) 49-51 MG Tab per tablet, Take 1 tablet by mouth 2 (two) times daily, Disp: 180 tablet, Rfl: 3    vitamin B-12 (CYANOCOBALAMIN) 100 MCG tablet, Take 1,000 mcg by mouth daily, Disp: , Rfl:     Family History   Problem Relation Age of Onset    Cancer Father 68        bladder    Diabetes Father     Heart attack Father        Social History     Socioeconomic History    Marital status: Widowed   Tobacco Use    Smoking status: Never    Smokeless tobacco: Never   Vaping Use    Vaping Use: Never used   Substance and Sexual Activity    Alcohol use: Not Currently     Alcohol/week: 0.0 standard drinks    Drug use: No    Sexual activity: Not Currently       Review of Systems   Constitutional: Denies weight changes, fever, chills, weakness or night sweats.   Neurologic: see HPI  HEENT:  Denies visual changes, hearing loss, difficulty swallowing, snoring or hoarseness.  Respiratory: Denies cough, wheezing, SOB or  hemoptysis.  Cardiovascular: See HPI. Denies chest pain, cardiac murmur, irregular heartbeat,  dyspnea, lower extremity swelling, lower extremity claudication.    Gastrointestinal:Denies abdominal pain, nausea, vomiting and diarrhea  Genitourinary: Denies for dysuria or hematuria.   Hematologic/Lymphatic:  Denies lymphadenopathy, bleeding disorders, easy bruising or anemia.   Endocrine:  See HPI. Denies thyroid disease, polydipsia, polyuria, heat or cold intolerance.    Musculoskeletal:  Cervical neck pain. Denies joint pain, back pain, muscle pain or muscle weakness.   Skin: Denies skin lesions, rashes, hives or hair loss on legs.    Psychiatric:  Denies depression, anxiety or panic attacks.    All other systems were reviewed and are negative except what is stated in the HPI    Physical Exam     Vitals:    12/20/20 1242 12/20/20 1248   BP: 128/71 120/74   BP Site: Right arm Left arm   Patient Position: Sitting Sitting   Cuff Size: Medium Medium   Pulse: 69 68   Temp: 97.7 F (36.5 C)    TempSrc: Temporal    SpO2: 98%    Weight: 64 kg (141 lb)    Height: 1.626 m (5\' 4" )        Body mass index is 24.2 kg/m.    General:  Patient appears their stated age, well-nourished.  Alert and in no apparent distress.  HEENT:  Normpcephalic.  EOMI.  Tenderness behind the left ear, no swelling noted on examination.   Neck:  No JVD.  Bilateral carotid bruits.   Lungs: Respiratory effort unlabored, chest expansion symmetric.  Cardiac: S1, S2. RRR, no murmurs, rubs or gallops.  Abd: Soft, nondistended, nontender.    Extremities:Full ROM, symmetric, warm, no ischemic changes, ulceration,  gangrene or edema.   Pulses: 2+ Bilateral radial and posterior tibial artery pulses  Skin: Warm, no varicosities.    Neuro: Good insight and judgment, oriented to person, place, and time , gross motor and sensory intact. Stable gait.       Imaging   Please see HPI.    Assessment and Plan   75 y/o female with a history of bilateral carotid artery  stenosis, now with progression of the R ICA 80-99%. Patient relates a history of left posterior ear pain that has been intermittent in nature. Pain is relieved with tylenol and warm compress. She sought medical attention at the ER in May 2022, no imaging completed at that time. But she relates pain did extend into her cheek at that time but no physical  findings concerning of CVA or carotid dissection. Patient was advised to follow up with her PCP. She has not been evaluated by an ENT provider. She denies any current symptoms of lateralizing neurologic ischemia or any history of TIA, CVA or amaurosis fugax.    - MRA demonstrated 80-99% stenosis of the R ICA, L ICA 60-70% stenosis  - Patient currently asymptomatic, and discussed with her pain behind the ear is less likely related to carotid stenosis  - Recommend that patient undergoes right carotid endarterectomy for treatment of high grade carotid artery stenosis with EEG and neuromonitoring  - Patient will obtain cardiac clearance with Dr. Nash Shearer  - The indications, benefits and risks have been explained to the patient in the presence of the nurse/resident/family.  Alternative therapies including non-operative, conservative therapies were discussed with the patient.  Risks including but not limited to bleeding, infection, nerve damage, damage to adjacent structures, need for adjunctive procedures, lack of signs and symptoms resolution, inability to complete procedure, retention of foreign bodies, fire/skin burn, heart attack, multisystem  organ failure, stroke and death were explained. No guarantees to surgical outcome were given or implied. The patient demonstrated understanding, and acceptance of the risks, and consented to proceed forth with the procedure.  All of the patient's questions were answered to the patient's satisfaction.     - Our office will contact patient to schedule  - She will continue aspirin and statin, hold Eliquis (hx of UE DVT) 2 days prior  to surgery    I hope this plan of care meets with your approval. Thank you for the opportunity to care for your patient.     Sincerely,     Henri Medal, MS, AGACNP-BC  [facility]  [address]  [place], [place] [address]  T [phone] ext [phone] F [phone]    Case discussed with Dr. Zollie Pee who agrees with plan

## 2020-12-17 ENCOUNTER — Other Ambulatory Visit (INDEPENDENT_AMBULATORY_CARE_PROVIDER_SITE_OTHER): Payer: Self-pay | Admitting: Cardiovascular Disease

## 2020-12-17 ENCOUNTER — Ambulatory Visit (INDEPENDENT_AMBULATORY_CARE_PROVIDER_SITE_OTHER): Payer: Medicare Other

## 2020-12-17 DIAGNOSIS — Z4502 Encounter for adjustment and management of automatic implantable cardiac defibrillator: Secondary | ICD-10-CM

## 2020-12-17 LAB — IN OFFICE CARDIAC DEVICE MONITORING: AF Burden Percentage: 0

## 2020-12-17 NOTE — Progress Notes (Signed)
Patient was seen in clinic today for in office device check.  Device information as follows.  For more detailed device information please contact [facility] staff at [phone].      Medtronic BiV ICD [id number], implanted on 08/26/2018, battery at 7.4 years.     AP: 0.4%  VP: 98.5%  Effective pacing: 98.5%  AF burden: 0%    Normal device function. No new events since last check. NO changes were made. Known hx, of AF, on Eliquis.     Continue routine remote monitoring. RTC as scheduled.     Thank you,   Grant Fontana   [facility]

## 2020-12-20 ENCOUNTER — Encounter (INDEPENDENT_AMBULATORY_CARE_PROVIDER_SITE_OTHER): Payer: Self-pay | Admitting: Specialist

## 2020-12-20 ENCOUNTER — Ambulatory Visit (INDEPENDENT_AMBULATORY_CARE_PROVIDER_SITE_OTHER): Payer: Medicare Other | Admitting: Acute Care

## 2020-12-20 ENCOUNTER — Telehealth (INDEPENDENT_AMBULATORY_CARE_PROVIDER_SITE_OTHER): Payer: Self-pay | Admitting: Acute Care

## 2020-12-20 VITALS — BP 120/74 | HR 68 | Temp 97.7°F | Ht 64.0 in | Wt 141.0 lb

## 2020-12-20 DIAGNOSIS — Z01818 Encounter for other preprocedural examination: Secondary | ICD-10-CM

## 2020-12-20 DIAGNOSIS — I6523 Occlusion and stenosis of bilateral carotid arteries: Secondary | ICD-10-CM

## 2020-12-20 NOTE — Telephone Encounter (Signed)
 [facility] - Scheduling Form    Surgeon: Marcell Anger, MD  Procedure:  R CEA   DX: Asymptomatic, right internal carotid artery stenosis   Date: Next available   Anesthesia: General   Location: [facility]  Admission: Inpatient - same day admit  Clearances required: Yes, Dr. Nash Shearer   Pre-op testing:   CBC and BMP        Rx entered into Epic.  Blood thinners:   apixaban/Eliquis   Hold 2 days prior to procedure and Aspirin   Continue    Allergies:  Allergies   Allergen Reactions    Shellfish-Derived Products Anaphylaxis     Special instructions:   - Patient will need neuromonitoring and EEG

## 2020-12-22 ENCOUNTER — Ambulatory Visit: Payer: Medicare Other | Attending: Hematology | Admitting: Hematology

## 2020-12-22 ENCOUNTER — Encounter: Payer: Self-pay | Admitting: Hematology

## 2020-12-22 ENCOUNTER — Other Ambulatory Visit: Payer: Medicare Other

## 2020-12-22 VITALS — BP 113/72 | HR 72 | Temp 97.6°F | Resp 16 | Ht 64.0 in | Wt 140.8 lb

## 2020-12-22 DIAGNOSIS — E538 Deficiency of other specified B group vitamins: Secondary | ICD-10-CM | POA: Insufficient documentation

## 2020-12-22 DIAGNOSIS — D508 Other iron deficiency anemias: Secondary | ICD-10-CM | POA: Insufficient documentation

## 2020-12-22 DIAGNOSIS — I82622 Acute embolism and thrombosis of deep veins of left upper extremity: Secondary | ICD-10-CM

## 2020-12-22 NOTE — Progress Notes (Signed)
OFFICE VISIT - [facility]      Total time spent prior to the visit reviewing records and prior labs:  5 minutes  Total time spent face to face with patient:  15 minutes.  Total time spent after face to face for coordination of care and documentation:  5 minutes   Total time spent on encounter: 25 minutes  Regarding: rpv LUE provoked DVT , RUE DVT     HPI/HEMATOLOGY HISTORY: Tara Perry is a 74 y.o. female with history of DM, NICM, CKD, UE VTE events.    Patient was admitted 6/5-6/16/20 for VF cardiac arrest- underwent hypothermic protocol. s/p BiV ICD placement 08/26/18. She presented to [facility] 08/30/18 with LUE swelling. She was found to have acute subclavian DVT. She was started on Lovenox therapeutic. Her course was complicated by hematemesis on 09/01/18. EGD showed large duodenal ulcer with visible vessels s/p cauterization, clipping. Also noted to have RUE extensive R basilic occlusive thrombus and phlebitis 09/06/18. She resumed anticoagulation and was discharged on Lovenox 1mg /kg BID after stability of hemoglobin on a/c.     Switched to Eliquis 5mg  BID from Lovenox in September.     COVID vaccinated    INTERVAL HISTORY:     80-90% stenosis of proximal R ICA. Surgical correction to be undertaken. L side will be monitored.     Continues Eliquis 2.5mg  BID, ASA and statin. Taking B12 daily.    No bleeding noted.    MEDICATIONS/ALLERGIES:     Outpatient Medications Marked as Taking for the 12/22/20 encounter (Appointment) with Russ Halo, MD   Medication Sig Dispense Refill    apixaban (Eliquis) 2.5 MG Take 1 tablet (2.5 mg total) by mouth every 12 (twelve) hours 180 tablet 3    aspirin EC 81 MG EC tablet Take 81 mg by mouth daily      atorvastatin (LIPITOR) 40 MG tablet Take 2 tablets (80 mg total) by mouth nightly 180 tablet 3    carvedilol (Coreg) 12.5 MG tablet Take 1 tablet (12.5 mg total) by mouth 2 (two) times daily with meals 180 tablet 3    glimepiride (AMARYL) 2 MG tablet Take 2 mg by  mouth daily      Jardiance 25 MG tablet 25 mg every morning         metFORMIN (GLUCOPHAGE) 500 MG tablet Take 1 tablet (500 mg total) by mouth 2 (two) times daily Morning and night (Patient taking differently: Take 500 mg by mouth 2 (two) times daily with meals)      Multiple Vitamins-Minerals (CENTRUM PO) Take by mouth daily      sacubitril-valsartan (Entresto) 49-51 MG Tab per tablet Take 1 tablet by mouth 2 (two) times daily 180 tablet 3    vitamin B-12 (CYANOCOBALAMIN) 100 MCG tablet Take 1,000 mcg by mouth daily       Allergies   Allergen Reactions    Shellfish-Derived Products Anaphylaxis     PAST HISTORY:    Past Medical History:   Diagnosis Date    Anemia June 2020    Atrophic vaginitis 08/30/2018    Bilateral cataracts     I have implants    Cardiac arrest 08/17/2018    VF arrest. She received 3 shocks, 1 round of epi with successful ROSC. 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/2018  per patient, he only sees peripheral view    Diabetes mellitus 1992    Encounter for blood transfusion     Gout spondylitis     Gout synovitis     Hypertension     LBBB (left bundle branch block)     LBBB chronic    NICM (nonischemic cardiomyopathy) 08/2018    NICM, EF 20%    Respiratory failure 08/2018    Hypoxic/anoxic respiratory failure, extubated 08/20/2018    Type 2 diabetes mellitus, controlled     Viral cardiomyopathy 2008      Past Surgical History:   Procedure Laterality Date    COLONOSCOPY  2002    normal    EGD N/A 09/01/2018    Procedure: EGD;  Surgeon: Lilia Pro, MD;  Location: [facility];  Service: Gastroenterology;  Laterality: N/A;    EXPLORATORY LAPAROTOMY      EYE SURGERY      ICD IMPLANT BIV N/A 08/26/2018    Procedure: ICD IMPLANT BIV;  Surgeon: Len Childs, MD;  Location: [facility];  Service: Cardiovascular;  Laterality: N/A;  medtronic    INSERT / REPLACE / REMOVE PACEMAKER       Difibrillator August 26, 2018    LHC W/ CORONARY ANGIOS AND LV Left 08/21/2018    Procedure: LHC W/ CORONARY ANGIOS AND LV;  Surgeon: Burna Forts, MD;  Location: [facility];  Service: Cardiovascular;  Laterality: Left;    SMALL INTESTINE SURGERY  November 2014     Family History   Problem Relation Age of Onset    Cancer Father 90        bladder    Diabetes Father     Heart attack Father      Social History:   5 grandkids (3mo-19y/o)  Non smoker   Walks routinely     Patient Active Problem List   Diagnosis    Peritonitis    Type II diabetes mellitus without mention of complication    Gout synovitis    Type 2 diabetes mellitus without complications    Viral cardiomyopathy    CHF (congestive heart failure)    Cardiac arrest    Atrophic vaginitis    Chronic vulvitis    Arm DVT (deep venous thromboembolism), acute, left    Iron deficiency anemia    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   All other systems reviewed and negative except as above.   Gen/Constitutional: No fevers, no chills, no unintentional weight loss, + fatigue.  Eyes: No visual changes   ENT: no hearing loss, no nasal discharge, no epistaxis, no sore throat   Cdv: no chest pain, no palpitations  Resp: no cough, no shortness of breath, no wheezing  GI: no abdominal pain, no heartburn, no nausea/vomiting, no diarrhea, no constipation, no hematochezia, no melena   GU: no hematuria, no urinary frequency  Musculoskeletal: no joint pain, no joint swelling, no restricted motion, no peripheral edema, no varicosities   Skin: no rashes, no sores, no blisters  Neuro: no numbness, no tingling, no burning sensation  Psych: no nervousness, no anxiety, no depression  Endo: no heat/cold  intolerance, no excessive thirst  Hem/Lymph: no bleeding (epistaxis, mucosal  bleeding, hematemesis, hemoptysis, hematochezia, hematuria, melena), no easy bruising, no lymphadenopathy     PHYSICAL EXAMINATION  Vital Signs: BP 113/72 (BP Site: Right arm, Patient Position: Sitting, Cuff Size: Medium)    Pulse 72    Temp 97.6 F (36.4 C) (Oral)    Resp 16    Ht 1.626 m (5\' 4" )    Wt 63.9 kg (140 lb 12.8 oz)    SpO2 98%    BMI 24.17 kg/m        General Appearance:  Alert, cooperative, no distress, appears stated age   Head: Normocephalic, without obvious abnormality, atraumatic   Eyes:  Sclera anicteric, conjunctiva without pallor   Lungs:    Abdomen:   Lung sounds CTA b/l     Non-distended   Musculoskeletal: No gait abnormalities    Skin: Normal skin color, texture, no bleeding or ecchymoses, no rashes or lesions   Extremities: No UE or LE edema   Neurologic:  Mental Status: Alert and oriented x3, face symmetric, tongue midline.  Normal mood and affect, cooperative     LABS/RADIOLOGY:     No visits with results within 1 Day(s) from this visit.   Latest known visit with results is:   Orders Only on 12/17/2020   Component Date Value Ref Range Status    Device Type 12/17/2020 CRT-D   Final    AF Burden Percentage 12/17/2020 0.00   Final     LUE Doppler U/S 09/01/18: IMPRESSION:   Extensive occlusive and nonocclusive left upper extremity DVT in the  region of left pacer wire, as detailed. Per discussion with clinical  team at the time of dictation, there are aware of this finding.    RUE Doppler U/S 09/07/18: IMPRESSION:    Extensive right basilic occlusive thrombus in associated  thrombophlebitis. Continuous flow right cephalic vein. Other findings as  Above.      IMPRESSION/RECOMMENDATIONS: Tara Perry is a 75 y.o. female with   1. Arm DVT (deep venous thromboembolism), acute, left    2. B12 deficiency    3. Other iron deficiency anemia      1. Provoked DVT: LUE provoked dvt from pacer wires and acute illness. RUE DVT  provoked in setting of line. She has completed 3 months therapeutic anticoagulation. Reviewed ultrasound reports from 01/10/19- no evidence of dvt but some minor chronic changes bilaterally. I am inclined to use prophylactic dosage anticoagulation going forward as opposed to stopping a/c completed as the ICD lead wire may very well be at risk /nidus for further recurrent clot formation-- though alternately, the lead would likely be endothelialized and perhaps pose less of a risk than prior. Patient is in agreement with continuation of low dose anticoagulation.   -D Dimer nrm, last Cr elevated but stable  -Cont Eliquis 2.5mg  BID  -Hold Eliquis 72 hours prior to surgery and resume next day    2. Iron defn anemia: history of IDA in setting of GI bleed.   - check iron studies    3. B12 deficiency: check B12 levels. Cont oral B12 in interim.    Pt instructed to send non urgent messages via MyChart and provided with [facility] clinic phone number. RTC 6 months.    Thank you for involving me in this patient's care.      Russ Halo, MD  Benign Hematology  [facility]  [address]  [place], [place] [address]  Phone: [phone]  Fax: [phone]

## 2020-12-23 ENCOUNTER — Other Ambulatory Visit (INDEPENDENT_AMBULATORY_CARE_PROVIDER_SITE_OTHER): Payer: Self-pay

## 2020-12-23 DIAGNOSIS — I6521 Occlusion and stenosis of right carotid artery: Secondary | ICD-10-CM

## 2020-12-23 NOTE — Telephone Encounter (Addendum)
Patient scheduled for:02/22/21    Surgeon: Marcell Anger, MD  Procedure: Right CEA with Neuromonitoring  Arrive at: 6:00 am  Anesthesia: Choice   Location: [facility]  Special instructions: NPO   Knows to have Labs: Yes, at [facility]  Need medication instructions:yes    Surgery Packet: My-Chart    Post op: 03/21/20 11:00 am    CPT Code:35301  ICD Code:I65.21 Right Carotid Stenosis  Admit Status:Same day Admit

## 2020-12-24 NOTE — Telephone Encounter (Signed)
Below medication instructions sent to patient with surgery scheduling packet via MyChart:    Pre-Day of Surgery Medication Instructions:  - apixaban (Eliquis): Please hold this medication 48 hours before surgery. The last day it should be taken is 02/19/2021. Please consult with the managing provider and request clearance to hold it as instructed here.     - Jardiance: Please hold this medication 72 hours prior to surgery. The last day it should be taken is 02/18/2021.    Day of Surgery Medication Instructions:  - You must have nothing to eat or drink from midnight onwards the morning of surgery.   - Do not take the following medications the morning of surgery:   - Vitamins or supplements   - Diabetes Medications: glimepiride (Amaryl), metformin (Glucophage)    - You can take the following medications on your normal schedule. Anything taken in the evening can be taken as normal the night before your procedure. Anything taken in the morning can be taken the morning of your surgery with small sips of water only.   - aspirin   - atorvastatin (Lipitor)   - carvedilol (Coreg)   - sacubitril-valsartan (Entresto)        Pre-Op Testing:  - CBC, BMP: Orders in Epic.   - Cardiology clearance, EKG: Dr. Nash Shearer  - Eliquis hold: Letter faxed to Dr. Fredda Hammed office at F: [phone].

## 2020-12-24 NOTE — Addendum Note (Signed)
Addended by: Warren Lacy on: 12/24/2020 09:06 AM     Modules accepted: Orders

## 2021-01-21 ENCOUNTER — Other Ambulatory Visit (FREE_STANDING_LABORATORY_FACILITY): Payer: Medicare Other

## 2021-01-21 DIAGNOSIS — I6523 Occlusion and stenosis of bilateral carotid arteries: Secondary | ICD-10-CM

## 2021-01-21 DIAGNOSIS — Z01818 Encounter for other preprocedural examination: Secondary | ICD-10-CM

## 2021-01-21 LAB — CBC AND DIFFERENTIAL
Absolute NRBC: 0 10*3/uL (ref 0.00–0.00)
Basophils Absolute Automated: 0.06 10*3/uL (ref 0.00–0.08)
Basophils Automated: 0.8 %
Eosinophils Absolute Automated: 0.29 10*3/uL (ref 0.00–0.44)
Eosinophils Automated: 4 %
Hematocrit: 37.2 % (ref 34.7–43.7)
Hgb: 12.3 g/dL (ref 11.4–14.8)
Immature Granulocytes Absolute: 0.02 10*3/uL (ref 0.00–0.07)
Immature Granulocytes: 0.3 %
Lymphocytes Absolute Automated: 2.01 10*3/uL (ref 0.42–3.22)
Lymphocytes Automated: 27.6 %
MCH: 30 pg (ref 25.1–33.5)
MCHC: 33.1 g/dL (ref 31.5–35.8)
MCV: 90.7 fL (ref 78.0–96.0)
MPV: 9.8 fL (ref 8.9–12.5)
Monocytes Absolute Automated: 0.8 10*3/uL (ref 0.21–0.85)
Monocytes: 11 %
Neutrophils Absolute: 4.11 10*3/uL (ref 1.10–6.33)
Neutrophils: 56.3 %
Nucleated RBC: 0 /100 WBC (ref 0.0–0.0)
Platelets: 223 10*3/uL (ref 142–346)
RBC: 4.1 10*6/uL (ref 3.90–5.10)
RDW: 13 % (ref 11–15)
WBC: 7.29 10*3/uL (ref 3.10–9.50)

## 2021-01-21 LAB — BASIC METABOLIC PANEL
Anion Gap: 8 (ref 5.0–15.0)
BUN: 30 mg/dL — ABNORMAL HIGH (ref 7.0–21.0)
CO2: 20 mEq/L (ref 17–29)
Calcium: 9.2 mg/dL (ref 7.9–10.2)
Chloride: 111 mEq/L (ref 99–111)
Creatinine: 1.6 mg/dL — ABNORMAL HIGH (ref 0.4–1.0)
Glucose: 226 mg/dL — ABNORMAL HIGH (ref 70–100)
Potassium: 4.5 mEq/L (ref 3.5–5.3)
Sodium: 139 mEq/L (ref 135–145)

## 2021-01-21 LAB — HEMOLYSIS INDEX: Hemolysis Index: 9 Index (ref 0–24)

## 2021-01-21 LAB — GFR: EGFR: 31.3

## 2021-01-27 ENCOUNTER — Encounter (INDEPENDENT_AMBULATORY_CARE_PROVIDER_SITE_OTHER): Payer: Self-pay | Admitting: Physician Assistant

## 2021-01-27 ENCOUNTER — Ambulatory Visit (INDEPENDENT_AMBULATORY_CARE_PROVIDER_SITE_OTHER): Payer: Medicare Other | Admitting: Physician Assistant

## 2021-01-27 ENCOUNTER — Other Ambulatory Visit: Payer: Self-pay

## 2021-01-27 VITALS — BP 122/80 | HR 62 | Wt 135.2 lb

## 2021-01-27 DIAGNOSIS — Z01810 Encounter for preprocedural cardiovascular examination: Secondary | ICD-10-CM

## 2021-01-27 DIAGNOSIS — I428 Other cardiomyopathies: Secondary | ICD-10-CM

## 2021-01-27 MED ORDER — CARVEDILOL 12.5 MG PO TABS
12.5000 mg | ORAL_TABLET | Freq: Two times a day (BID) | ORAL | 3 refills | Status: DC
Start: 2021-01-27 — End: 2021-10-14

## 2021-01-27 MED ORDER — APIXABAN 2.5 MG PO TABS
2.5000 mg | ORAL_TABLET | Freq: Two times a day (BID) | ORAL | 3 refills | Status: DC
Start: 2021-01-27 — End: 2021-10-14

## 2021-01-27 NOTE — Progress Notes (Signed)
[facility] CARDIOLOGY OFFICE PROGRESS NOTE    HRT [facility] OFFICE      [facility] OFFICE -CARDIOLOGY  [address]  [place] [address]  Dept: [phone]  Dept Fax: [phone]       Patient Name: Tara Perry    Date of Visit:  January 27, 2021  Date of Birth: 06/28/1945  AGE: 75 y.o.  Medical Record #: [id number]  Requesting Physician: Tammi Sou, MD      CHIEF COMPLAINT: Pre-op Exam      HISTORY OF PRESENT ILLNESS:    She is a pleasant 75 y.o. female who presents today for preoperative cardiac risk stratification ahead of carotid endarterectomy right planned 02/22/2021 with Dr. Zollie Pee.  She denies chest pain, dyspnea, ICD shocks, palpitations.  Active around her home without limitations.  Knits in her spare time. She relays that her surgery team has told her she can continue aspirin in the periprocedural time frame. History of a prior TIA.       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 She has a past surgical history that includes Colonoscopy (2002); Exploratory laparotomy; LHC w/ Coronary Angios and LV (Left, 08/21/2018); ICD Implant BiV (N/A, 08/26/2018); EGD (N/A, 09/01/2018); Eye surgery; Small intestine surgery (November 2014); and Insert / replace / remove pacemaker.    ALLERGIES:   Allergies   Allergen Reactions    Shellfish-Derived Products Anaphylaxis       MEDICATIONS:   Current Outpatient Medications:     apixaban (Eliquis) 2.5 MG,  Take 1 tablet (2.5 mg) by mouth every 12 (twelve) hours, Disp: 180 tablet, Rfl: 3    aspirin EC 81 MG EC tablet, Take 81 mg by mouth daily, Disp: , Rfl:     atorvastatin (LIPITOR) 40 MG tablet, Take 2 tablets (80 mg total) by mouth nightly, Disp: 180 tablet, Rfl: 3    glimepiride (AMARYL) 2 MG tablet, Take 2 mg by mouth daily, Disp: , Rfl:     Jardiance 25 MG tablet, 25 mg every morning  , Disp: , Rfl:     metFORMIN (GLUCOPHAGE) 500 MG tablet, Take 1 tablet (500 mg total) by mouth 2 (two) times daily Morning and night (Patient taking differently: Take 500 mg by mouth 2 (two) times daily with meals), Disp: , Rfl:     Multiple Vitamins-Minerals (CENTRUM PO), Take by mouth daily, Disp: , Rfl:     sacubitril-valsartan (Entresto) 49-51 MG Tab per tablet, Take 1 tablet by mouth 2 (two) times daily, Disp: 180 tablet, Rfl: 3    vitamin B-12 (CYANOCOBALAMIN) 100 MCG tablet, Take 1,000 mcg by mouth daily, Disp: , Rfl:     carvedilol (Coreg) 12.5 MG tablet, Take 1 tablet (12.5 mg) by mouth 2 (two) times daily with meals, Disp: 180 tablet, Rfl: 3     FAMILY HISTORY: family history includes Cancer (age of onset: 30) in her father; Diabetes in her father; Heart attack in her father.    SOCIAL HISTORY: She reports that she has never smoked. She has never used smokeless tobacco. She reports that she does not currently use alcohol. She reports that  she does not use drugs.    PHYSICAL EXAMINATION    Visit Vitals  BP 122/80 (BP Site: Right arm, Patient Position: Sitting, Cuff Size: Medium)   Pulse 62   Wt 61.3 kg (135 lb 3.2 oz)   BMI 23.21 kg/m       General Appearance:  A well-appearing female in no acute distress.    Skin: Warm and dry to touch   Head: Normocephalic   Eyes: EOMS Intact   ZOX:WRUEAV  Neck: JVP normal, no carotid bruit    Chest: Clear to auscultation bilaterally   Cardiovascular: Regular rhythm no murm   Abdomen: no guarding  Extremities: Warm without edema.   Neuro: Alert and oriented x3.        ECG: sinus,  V-paced      LABS:   Lab Results   Component Value Date    WBC 7.29 01/21/2021    HGB 12.3 01/21/2021    HCT 37.2 01/21/2021    PLT 223 01/21/2021     Lab Results   Component Value Date    GLU 226 (H) 01/21/2021    BUN 30.0 (H) 01/21/2021    CREAT 1.6 (H) 01/21/2021    NA 139 01/21/2021    K 4.5 01/21/2021    CL 111 01/21/2021    CO2 20 01/21/2021    AST 16 08/03/2020    ALT 12 08/03/2020     Lab Results   Component Value Date    MG 1.7 09/08/2018    TSH 1.06 09/13/2012    HGBA1C 6.5 (H) 08/24/2018    BNP 139 (H) 08/16/2018     Lab Results   Component Value Date    CHOL 161 10/23/2018    TRIG 223 (H) 10/23/2018    HDL 42 10/23/2018    LDL 74 10/23/2018          IMPRESSION:   Tara Perry is a 75 y.o. female with the following problems:     Preoperative cardiac risk stratification ahead of R CEA 02/22/2021 with Dr. Zollie Pee  Chronic systolic heart failure, NYHA class II, euvolemic and well compensated  Echocardiogram 08/19/2018: EF 20%, dyskinesis of the basal/mid anterior, anteroseptal, and inferoseptal LV, moderate hypokinesis of the basal inferior wall, normal RV function, down from 40% on October 2017 echo  Echocardiogram 11/05/2018: EF 40%  Nonischemic cardiomyopathy, s/p cardiac catheterization 08/21/2018 with no CAD  Prior intolerance to spironolactone/MRAs  Prior ventricular fibrillation arrest, witnessed, hospitalized 08/16/2018 through 08/27/2018, successfully resuscitated by EMS, intubated, underwent successful therapeutic hypothermia protocol then extubated, s/p biventricular AICD placed 08/26/2018  Prior left upper extremity DVT requiring hospitalization 08/30/2018 through 09/08/2018 with anticoagulation at that time complicated by hematemesis requiring endoscopy showing nonbleeding duodenal ulcer treated with epinephrine and cautery/clipping  Hypertension, controlled   Hypercholesterolemia, on statin therapy  Chronic left bundle branch block  Type 2 diabetes  Chronic kidney disease  Bilateral carotid artery  disease  Carotid duplex study 03/08/2018: Bilateral 50 to 69% ICA disease  Carotid duplex 05/07/2019: 70-99% right ICA, 50 to 69% left ICA  Possible symptomatic TIA 05/19/2019 (left facial numbness leading to ER visit)  MRA 06/16/2019: 75% right ICA, 40% left ICA  Carotid duplex 12/15/2019: 70-99% right ICA, 50 to 69% left ICA        RECOMMENDATIONS:     Reviewed clinical scenario with Dr. Nash Shearer. She is well optimized from cardiac standpoint and is acceptable cardiac risk to undergo planned CEA without the need for preoperative cardiac testing. Eliquis holding  instructions are deferred to her hematologist.   F/u primary cardiologist 6 mo                                                      Orders Placed This Encounter   Procedures    ECG 12 lead (Normal)    Office Visit (HRT [facility])       Orders Placed This Encounter   Medications    carvedilol (Coreg) 12.5 MG tablet     Sig: Take 1 tablet (12.5 mg) by mouth 2 (two) times daily with meals     Dispense:  180 tablet     Refill:  3       SIGNED:    Lewanda Rife, PA          This note was generated by the Dragon speech recognition and may contain errors or omissions not intended by the user. Grammatical errors, random word insertions, deletions, pronoun errors, and incomplete sentences are occasional consequences of this technology due to software limitations. Not all errors are caught or corrected. If there are questions or concerns about the content of this note or information contained within the body of this dictation, they should be addressed directly with the author for clarification.

## 2021-02-16 ENCOUNTER — Ambulatory Visit (FREE_STANDING_LABORATORY_FACILITY): Payer: Medicare Other | Admitting: Family

## 2021-02-16 ENCOUNTER — Encounter (INDEPENDENT_AMBULATORY_CARE_PROVIDER_SITE_OTHER): Payer: Self-pay | Admitting: Internal Medicine

## 2021-02-16 ENCOUNTER — Encounter (INDEPENDENT_AMBULATORY_CARE_PROVIDER_SITE_OTHER): Payer: Self-pay | Admitting: Family

## 2021-02-16 VITALS — BP 128/74 | HR 85 | Ht 64.0 in | Wt 137.0 lb

## 2021-02-16 DIAGNOSIS — I428 Other cardiomyopathies: Secondary | ICD-10-CM

## 2021-02-16 DIAGNOSIS — I509 Heart failure, unspecified: Secondary | ICD-10-CM

## 2021-02-16 DIAGNOSIS — E1122 Type 2 diabetes mellitus with diabetic chronic kidney disease: Secondary | ICD-10-CM

## 2021-02-16 DIAGNOSIS — B3324 Viral cardiomyopathy: Secondary | ICD-10-CM

## 2021-02-16 DIAGNOSIS — D509 Iron deficiency anemia, unspecified: Secondary | ICD-10-CM

## 2021-02-16 DIAGNOSIS — E538 Deficiency of other specified B group vitamins: Secondary | ICD-10-CM

## 2021-02-16 DIAGNOSIS — E119 Type 2 diabetes mellitus without complications: Secondary | ICD-10-CM

## 2021-02-16 DIAGNOSIS — Z9289 Personal history of other medical treatment: Secondary | ICD-10-CM | POA: Insufficient documentation

## 2021-02-16 DIAGNOSIS — Z8674 Personal history of sudden cardiac arrest: Secondary | ICD-10-CM

## 2021-02-16 DIAGNOSIS — I340 Nonrheumatic mitral (valve) insufficiency: Secondary | ICD-10-CM | POA: Insufficient documentation

## 2021-02-16 DIAGNOSIS — D508 Other iron deficiency anemias: Secondary | ICD-10-CM

## 2021-02-16 DIAGNOSIS — Z8679 Personal history of other diseases of the circulatory system: Secondary | ICD-10-CM

## 2021-02-16 DIAGNOSIS — Z86718 Personal history of other venous thrombosis and embolism: Secondary | ICD-10-CM

## 2021-02-16 DIAGNOSIS — Z8673 Personal history of transient ischemic attack (TIA), and cerebral infarction without residual deficits: Secondary | ICD-10-CM

## 2021-02-16 DIAGNOSIS — Z01818 Encounter for other preprocedural examination: Secondary | ICD-10-CM

## 2021-02-16 DIAGNOSIS — N1832 Chronic kidney disease, stage 3b: Secondary | ICD-10-CM

## 2021-02-16 LAB — CBC
Absolute NRBC: 0 10*3/uL (ref 0.00–0.00)
Hematocrit: 38.1 % (ref 34.7–43.7)
Hgb: 12.6 g/dL (ref 11.4–14.8)
MCH: 31.1 pg (ref 25.1–33.5)
MCHC: 33.1 g/dL (ref 31.5–35.8)
MCV: 94.1 fL (ref 78.0–96.0)
MPV: 10.9 fL (ref 8.9–12.5)
Nucleated RBC: 0 /100 WBC (ref 0.0–0.0)
Platelets: 223 10*3/uL (ref 142–346)
RBC: 4.05 10*6/uL (ref 3.90–5.10)
RDW: 12 % (ref 11–15)
WBC: 7.79 10*3/uL (ref 3.10–9.50)

## 2021-02-16 LAB — FERRITIN: Ferritin: 78.8 ng/mL (ref 4.60–204.00)

## 2021-02-16 LAB — IRON PROFILE
Iron Saturation: 32 % (ref 15–50)
Iron: 92 ug/dL (ref 40–145)
TIBC: 292 ug/dL (ref 265–497)
UIBC: 200 ug/dL (ref 126–382)

## 2021-02-16 LAB — GFR: EGFR: 31.3

## 2021-02-16 LAB — BASIC METABOLIC PANEL
Anion Gap: 12 (ref 5.0–15.0)
BUN: 30 mg/dL — ABNORMAL HIGH (ref 7.0–21.0)
CO2: 21 mEq/L (ref 17–29)
Calcium: 9.8 mg/dL (ref 7.9–10.2)
Chloride: 107 mEq/L (ref 99–111)
Creatinine: 1.6 mg/dL — ABNORMAL HIGH (ref 0.4–1.0)
Glucose: 223 mg/dL — ABNORMAL HIGH (ref 70–100)
Potassium: 5 mEq/L (ref 3.5–5.3)
Sodium: 140 mEq/L (ref 135–145)

## 2021-02-16 LAB — HEMOGLOBIN A1C
Average Estimated Glucose: 203 mg/dL
Hemoglobin A1C: 8.7 % — ABNORMAL HIGH (ref 4.6–5.9)

## 2021-02-16 LAB — TYPE AND SCREEN
AB Screen Gel: NEGATIVE
ABO Rh: B POS

## 2021-02-16 LAB — HEMOLYSIS INDEX
Hemolysis Index: 9 Index (ref 0–24)
Hemolysis Index: 6 Index (ref 0–24)

## 2021-02-16 LAB — VITAMIN B12: Vitamin B-12: 1226 pg/mL — ABNORMAL HIGH (ref 211–911)

## 2021-02-16 NOTE — PEC In-Person Visit (H&P) (Addendum)
Pre-Anesthesia Evaluation     Pre-op Interview visit requested by: Benita Gutter, MD  Reason for pre-op interview visit: Patient anticipating RIGHT CAROTID ARTERY ENDARTERECTOMY WITH NEUROMONITORING procedure.    History of Present Illness/Summary:  Patient presents to the [facility] clinic for a pre-operative evaluation.    Assessment/Plan:    1.  Encounter for Pre-operative Evaluation [id number]  Patient is elevated risk for cardiovascular peri-operative complications 2/2 cerebrovascular disease, non-ischemic cardiomyopathy with IACD/Pacemaker, Type II Diabetes Mellitus, CKD, IDA, HFrEF, however not prohibitive to proceed with time sensitive surgery.     2.  Surgical Diagnosis:  Carotid stenosis, right [I65.21]  -Eliquis to be held 48 hours prior to surgery per surgeon  -Continue on ASA    MR Angiogram Neck (12/13/2020)   Extensive atherosclerotic plaque at the right carotid bifurcation  resulting in 80-90% stenosis of the proximal right internal carotid artery  based on NASCET criteria.  3.  Moderate atherosclerotic plaque at the left carotid bifurcation  resulting in approximately 60-70% stenosis based on NASCET criteria.    3.  History of cardiac arrest 08/16/2018 (s/p BiV ICD)  Device interrogation on 01/01/21:  Medtronic BiV ICD [id number], implanted on 08/26/2018, battery at 7.4 years.   AP: 0.4%  VP: 98.5%  Effective pacing: 98.5%  AF burden: 0%    -s/p viral cardiomyopathy  -CIED form below      4.  HFrEF  -No dyspnea  -Euvolemic  -No reported orthopnea  -Functional METS >5  -Continues on sacubitril/valsartan  -Carvedilol  -Statin  -EF 40% from 2020    TTE 11/05/2018  -Systolic function is mildly reduced  -EF 40%  -Mild mitral valve regurgitation  -Mild global left ventricular hypokinesis along with abnormal septal motion from a   conduction abnormality    5.  History of TIA (12/29/2016 & 05/19/2019)  -2021 ED visit for left facial numbness  -2018 ED visit for HA and facial numbness x 2 weeks  -No residuals    Head CT WO  (05/19/2019)  -Mild age-related cerebral volume loss  -Minimal chronic small vessel ischemic changes in the periventricular white matter  -No acute intracranial hemorrhage or mass effect  -Mildly extensive intracranial vascular calcifications    6.  Type II Diabetes Mellitus  -HgbA1C 8.7 (02/16/2021)  -Currently treated with SGLT inhibitor, sulfonylurea and biguanide  -Has seen endocrinology in the past.  We had a discussion re controlling diabetes and elevated HgbA1C and patient is agreeable to be referred to [facility] Endocrinology.      7.  CKD Stage 3B (GFR 31.3 on 02/16/21)  -Creat 1.6 (02/16/21)    8.  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 Zollie Pee (02/16/21)  Ferritin 78.80  Iron 92  UIBC 200  TIBC 292  Iron Sat 32  B12 1226      9.  History of Left Arm DVT (09/01/2018 provoked s/p pacemaker placement)    US Venous Bilat Upper Extremity (07/11/19)  No evidence of deep or superficial venous thrombosis, left or right upper  extremity.  Some degree of dampening of  the respiratory phasicity of the left subclavian vein as well as question  of web like changes within the left innominate vein are suggestive of  underlying stenosis likely due to prior DVT and underlying pacemaker  leads..      Findings are compatible with chronic changes involving the left  innominate/central subclavian vein likely a  result of underlying venous  stenosis can be seen in the setting of postthrombotic syndrome with  underlying implanted pacemaker wires.    10.  Hypertension (treated)  -Stable on medications  -128/74 today in the office    Patient voiced understanding of all instructions.  All questions and concerns addressed at this time. This assessment will be conveyed to the surgery and anesthesiology teams & the patient will be evaluated the morning of surgery.    Medication List  As of 02/16/2021 11:03 AM  Apixaban 2.5 mg Oral Every 12 hours (Stop 2 days before  surgery)    Aspirin 81 mg Oral Daily (Take as prescribed)    Atorvastatin Calcium 80 mg Oral At bedtime (Take as prescribed)    Carvedilol 12.5 mg Oral 2 times daily with meals (Take as prescribed)    Cyanocobalamin 1,000 mcg Oral Daily,  (Hold day of surgery)    Empagliflozin 25 mg Every morning (Stop 3 days before surgery)    Glimepiride 2 mg Oral Daily (Hold day of surgery)    metFORMIN HCl 500 mg Oral 2 times daily, Morning and night   Patient taking differently:  Take 500 mg by mouth 2 (two) times daily with meals (Hold day of surgery)    Multiple Vitamins-Minerals Oral Daily (Hold day of surgery)    Sacubitril-Valsartan 49-51 MG 1 tablet Oral 2 times daily (Hold day of surgery)      Problem List:  Medical Problems       Hospital Problem List  Date Reviewed: 02/16/2021   None        Non-Hospital Problem List  Date Reviewed: 02/16/2021            ICD-10-CM Priority Class Noted    Gout synovitis M10.9   02/08/2011    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   09/13/2012    Overview Addendum 02/16/2021 12:33 PM by Rometta Emery, FNP     With cardiac arrest  AICD/Pacemaker         CHF (congestive heart failure) I50.9   09/13/2012    Overview Signed 02/16/2021 12:34 PM by Rometta Emery, FNP     TTE 11/05/2018  -Systolic function is mildly reduced  -EF 40%  -Mild mitral valve regurgitation  -Mild global left ventricular hypkenesis along with abnormal septal motion from a conduction abnormaltiy         Atrophic vaginitis N95.2   08/30/2018    Iron deficiency anemia D50.9   10/30/2018    Asymptomatic carotid artery stenosis without infarction, bilateral I65.23   06/30/2019    CIED MRI Order Document 22 Sep 22 valid until 22 Sep 23 (Chronic) Z95.810   12/02/2020    Overview Addendum 12/02/2020  4:18 PM by Theo Dills, PA         Cardiovascular Implantable Electronic Devices (CIED) MRI Order Documentation    Date of Order: December 02, 2020  Patient Name: Tara Perry, Tara Perry  Date of Birth: 1945-10-09  Medical Record #:  [id number]     Account#:  [id number]    CIED Information  Manufacturer: Medtronic Model: Huel Coventry CRT-D MRI   Indication for CIED: NICM, Secondary prevention for VF arrest  Date of last interrogation: 28 Jun 22   Location of CIED: Left Pectoral    Full system MRI conditional: yes  Battery life at EOL/ERI:  no  Leads implanted >6 weeks (all companies, except Abbott/St Jude who do not need 6 week wait):  yes  Evidence of fractured/compromised leads: no  Pacing thresholds <2.5V @ <0.68ms: yes     Pacing Mode for MRI: DOO   Pacing Rate for MRI: 75    MRI timeout (if applicable): n/a      Signed by:   Karis Juba PhD, DrPH, DMSc, PA-C   Cardiac Electrophysiology   [facility]         Type 2 diabetes mellitus with diabetic chronic kidney disease E11.22   02/16/2021    Mitral valve regurgitation I34.0   02/16/2021    Overview Signed 02/16/2021 12:25 PM by Rometta Emery, FNP     TTE 11/05/2018  -Systolic function is mildly reduced  -EF 40%  -Mild mitral valve regurgitation  -Mild global left ventricular hypkenesis along with abnormal septal motion from a conduction abnormaltiy         History of left bundle branch block (LBBB) Z86.79   02/16/2021    Overview Signed 02/16/2021 12:42 PM by Rometta Emery, FNP     chronic         History of blood transfusion Z92.89   02/16/2021    Overview Signed 02/16/2021 12:43 PM by Rometta Emery, FNP     Unsure of dates, but remembers had a transfusion during childbirth         Nonischemic cardiomyopathy I42.8   02/16/2021    History of TIA (transient ischemic attack) Z86.73   02/16/2021    Overview Signed 02/16/2021  1:08 PM by Rometta Emery, FNP     12/29/2016 & 05/19/2019  Presented to ED with left facial numbness    Head CT WO (05/19/2019)  -Mild age-related cerebral volume loss  -Minimal chronic small vessel ischemic changes in the periventricular white matter  -No acute intracranial hemorrhage or mass effect  -Mildly extensive intracranial vascular calcifications         History of cardiac arrest  Z86.74   02/16/2021    Overview Signed 02/16/2021  1:28 PM by Rometta Emery, FNP     S/p viral cardiomyopathy (08/16/2018)  AICD/Pacemaker         History of DVT (deep vein thrombosis) Z86.718   02/16/2021    Overview Signed 02/16/2021  1:28 PM by Rometta Emery, FNP     Left Upper Extremity  Provoked S/P Pacemaker placement   (08/2018)             Medical History   Diagnosis Date   . Anemia 08/2018   . 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 Bilateral cataracts     I have implants   . 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 She received 3 shocks, 1 round of epi with successful ROSC. 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   . 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 CKD (chronic kidney disease), stage III    . Congestive heart failure    . 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 Diabetes mellitus 1992   . Encounter for blood transfusion     unsure of dates   . Gout spondylitis    . Gout synovitis    . Hypertension    . LBBB (left bundle branch block)     LBBB chronic   . NICM (nonischemic cardiomyopathy) 08/2018    NICM, EF 20%   . Renal insufficiency    . Respiratory failure 08/2018    Hypoxic/anoxic respiratory failure, extubated 08/20/2018   . Type 2 diabetes mellitus, controlled    . Viral cardiomyopathy  2008     Past Surgical History:   Procedure Laterality Date   . COLONOSCOPY  2002    normal   . EGD N/A 09/01/2018    Procedure: EGD;  Surgeon: Lilia Pro, MD;  Location: [facility];  Service: Gastroenterology;  Laterality: N/A;   . EXPLORATORY LAPAROTOMY     . EYE SURGERY     . ICD IMPLANT BIV N/A 08/26/2018    Procedure: ICD IMPLANT BIV;  Surgeon: Len Childs, MD;  Location: [facility];  Service: Cardiovascular;  Laterality: N/A;  medtronic   . INSERT / REPLACE / REMOVE PACEMAKER      Difibrillator August 26, 2018   . LHC W/ CORONARY ANGIOS AND LV Left 08/21/2018    Procedure: LHC W/ CORONARY ANGIOS AND LV;  Surgeon: Burna Forts, MD;  Location: [facility];  Service: Cardiovascular;   Laterality: Left;   . SMALL INTESTINE SURGERY  01/2013       Allergies   Allergen Reactions   . Shellfish-Derived Products Anaphylaxis     Social History     Occupational History   . Not on file   Tobacco Use   . Smoking status: Never   . Smokeless tobacco: Never   Vaping Use   . Vaping Use: Never used   Substance and Sexual Activity   . Alcohol use: Not Currently     Alcohol/week: 1.0 standard drink     Types: 1 Glasses of wine per week     Comment: rarely   . Drug use: No   . Sexual activity: Not Currently       Menstrual History:   LMP / Status  Postmenopausal     No LMP recorded. Patient is postmenopausal.    Tubal Ligation?  No valid surgical or medical questions entered.       Exam Scores:   SDB score  Risk Category: No Risk    PONV score  Nausea Risk: SEVERE RISK    MST score  MST Score: 0    Allergy score  Risk Category: Low Risk    Frailty score  CFS Score: 3    MICA  MICA %: 0.94    RCRI score  RCRI Score: 4    DASI  DASI Score: 18.95  METs Level: 5.07       Visit Vitals  BP 128/74   Pulse 85   Ht 1.626 m (5\' 4" )   Wt 62.1 kg (137 lb)   SpO2 98%   BMI 23.52 kg/m       Review of Systems   Constitutional: Negative.    HENT: Negative.     Eyes: Negative.    Respiratory: Negative.     Cardiovascular: Negative.    Gastrointestinal: Negative.    Genitourinary: Negative.    Musculoskeletal:         Intermittent joint pains, non-specific   Skin: Negative.    Neurological: Negative.    Endo/Heme/Allergies: Negative.    Psychiatric/Behavioral: Negative.       Physical Exam:  Mallampati: III  TM distance: > 3 FB (> 6 cm)  Mouth opening: 3 FB (4-6 cm)  Neck extension: full    Dental comments: Several crowns    Normal neurological exam  Mental status: alert and oriented x3    Normal cardiovascular exam  Heart rhythm: regular  no murmur:    (-) a murmur and peripheral edema    Normal pulmonary exam  Breath sounds clear to auscultation bilaterally    (-)  external extremity abnormalities    Abdomen soft/NT/ND     Cardiac  Testing:    CXR (05/19/2019)  -Pacer/ICD in place  -Lungs clear with normal pulmonary vascularity  -No acute cardiopulmonary process    EKG (01/27/2021)  -Electronic ventricular pacemaker  -Pacemaker ECG    Left Heart Cath (08/22/2018)  -No coronary artery disease.  -Nonischemic cardiomyopathy.  -Severe left ventricular systolic dysfunction.  -Status post ventricular fibrillation cardiac arrest (reason for  admission).    Recent Labs   CBC (last 180 days) 01/21/21  1119 02/16/21  1130   WBC 7.29 7.79   RBC 4.10 4.05   Hgb 12.3 12.6   Hematocrit 37.2 38.1   MCV 90.7 94.1   MCH 30.0 31.1   MCHC 33.1 33.1   RDW 13 12   Platelets 223 223   MPV 9.8 10.9   Nucleated RBC 0.0 0.0   Absolute NRBC 0.00 0.00     Recent Labs   BMP (last 180 days) 01/21/21  1119 02/16/21  1130   Glucose 226* 223*   BUN 30.0* 30.0*   Creatinine 1.6* 1.6*   Sodium 139 140   Potassium 4.5 5.0   Chloride 111 107   CO2 20 21   Calcium 9.2 9.8   Anion Gap 8.0 12.0   EGFR 31.3 31.3         Recent Labs   Other (last 180 days) 02/16/21  0601 02/16/21  1130   Hemoglobin A1C  --  8.7*   Iron 92  --    Ferritin 78.80  --    Iron Saturation 32  --    Vitamin B-12 1,226*  --      Recent Labs   Type & Screen (last 34 days) 02/16/21  1140   ABO Rh B POS   AB Screen Gel NEG         Anesthesia Plan:  ASA 3   Anesthetic Options Discussed: General  Potential anesthesia/Perioperative problems: Increased Risk of Cardiac Event          A discussion with regarding next steps to prepare for the procedure and the planned anesthesia care took place during today's visit.  I explained that the patient will meet with their anesthesiology providers on the DOS.  Discussed with Patient              Ileene Musa, NP

## 2021-02-16 NOTE — Pre-Procedure Instructions (Signed)
Important Instructions Before Your Procedure        Your case is currently scheduled for 02/22/2021 at 0900 with Bade, Maseer A, MD.      The date and/or time of your surgery may change.  Your surgeon's office will notify you, up until the business day before surgery, if there is any change to your surgery date or time.  Please don't hesitate to call your surgeon's office directly with any questions.        IMPORTANT You must visit the Preparing for Your Procedure guide link below for additional instructions including fasting guidelines and directions before your procedure. If you received fasting or skin preparation instructions from your surgeon or pre-procedural provider, please follow those specific instructions. The instructions here are general instructions that do not pertain to all patients.  [email or web address]    If the link doesn't open, please copy and paste in your browser    QUESTIONS?  If you have any questions about your pre-procedural evaluation, please call the clinic location where your appointment was performed:     [facility]: [phone]  [facility]: [phone]  [facility]: [phone]  [place]: [phone]  [place]: [phone]

## 2021-02-16 NOTE — Telephone Encounter (Signed)
Pre-Op Testing:  - CBC, BMP: pending PEC 12/7    - EKG: 11/17 sinus, V paced  - Cardiology clearance, EKG: 01/27/21 in Epic- Dr. Nash Shearer

## 2021-02-17 ENCOUNTER — Encounter: Payer: Self-pay | Admitting: Hematology

## 2021-02-17 NOTE — Telephone Encounter (Signed)
Pre-Op Testing:  - CBC, BMP: 12/7 BG 223- known DM. Ok to proceed     - EKG: 11/17 sinus, V paced  - Cardiology clearance, EKG: 01/27/21 in Epic- Dr. Nash Shearer

## 2021-02-18 ENCOUNTER — Encounter (INDEPENDENT_AMBULATORY_CARE_PROVIDER_SITE_OTHER): Payer: Self-pay

## 2021-02-18 NOTE — Progress Notes (Signed)
CIED form request was received from [facility] regarding her upcoming procedure of right carotid endarterectomy on the right side of the neck. Completed CIED form along with most recent device interrogation report was faxed to [phone]. Confirmation received. Confirmation and report is scanned in epic and attached with this encounter.

## 2021-02-22 ENCOUNTER — Inpatient Hospital Stay
Admission: RE | Admit: 2021-02-22 | Discharge: 2021-02-23 | DRG: 038 | Disposition: A | Discharge status: Home / Self Care | Payer: Medicare Other | Source: Ambulatory Visit | Attending: Specialist | Admitting: Specialist

## 2021-02-22 ENCOUNTER — Inpatient Hospital Stay: Payer: Medicare Other | Admitting: Student in an Organized Health Care Education/Training Program

## 2021-02-22 ENCOUNTER — Encounter
Admission: RE | Disposition: A | Discharge status: Home / Self Care | Payer: Self-pay | Source: Ambulatory Visit | Attending: Specialist

## 2021-02-22 ENCOUNTER — Ambulatory Visit: Payer: Self-pay

## 2021-02-22 DIAGNOSIS — N183 Chronic kidney disease, stage 3 unspecified: Secondary | ICD-10-CM | POA: Diagnosis present

## 2021-02-22 DIAGNOSIS — I6523 Occlusion and stenosis of bilateral carotid arteries: Secondary | ICD-10-CM | POA: Insufficient documentation

## 2021-02-22 DIAGNOSIS — E1122 Type 2 diabetes mellitus with diabetic chronic kidney disease: Secondary | ICD-10-CM | POA: Diagnosis present

## 2021-02-22 DIAGNOSIS — Z95810 Presence of automatic (implantable) cardiac defibrillator: Secondary | ICD-10-CM

## 2021-02-22 DIAGNOSIS — Z86718 Personal history of other venous thrombosis and embolism: Secondary | ICD-10-CM

## 2021-02-22 DIAGNOSIS — I5032 Chronic diastolic (congestive) heart failure: Secondary | ICD-10-CM | POA: Diagnosis present

## 2021-02-22 DIAGNOSIS — Z7901 Long term (current) use of anticoagulants: Secondary | ICD-10-CM

## 2021-02-22 DIAGNOSIS — Z8673 Personal history of transient ischemic attack (TIA), and cerebral infarction without residual deficits: Secondary | ICD-10-CM

## 2021-02-22 DIAGNOSIS — I130 Hypertensive heart and chronic kidney disease with heart failure and stage 1 through stage 4 chronic kidney disease, or unspecified chronic kidney disease: Secondary | ICD-10-CM | POA: Diagnosis present

## 2021-02-22 DIAGNOSIS — I6521 Occlusion and stenosis of right carotid artery: Principal | ICD-10-CM | POA: Diagnosis present

## 2021-02-22 DIAGNOSIS — Z8711 Personal history of peptic ulcer disease: Secondary | ICD-10-CM

## 2021-02-22 DIAGNOSIS — I340 Nonrheumatic mitral (valve) insufficiency: Secondary | ICD-10-CM | POA: Diagnosis present

## 2021-02-22 DIAGNOSIS — F40240 Claustrophobia: Secondary | ICD-10-CM | POA: Diagnosis present

## 2021-02-22 DIAGNOSIS — Z8674 Personal history of sudden cardiac arrest: Secondary | ICD-10-CM

## 2021-02-22 DIAGNOSIS — Z7982 Long term (current) use of aspirin: Secondary | ICD-10-CM

## 2021-02-22 DIAGNOSIS — I428 Other cardiomyopathies: Secondary | ICD-10-CM | POA: Diagnosis present

## 2021-02-22 DIAGNOSIS — Z7984 Long term (current) use of oral hypoglycemic drugs: Secondary | ICD-10-CM

## 2021-02-22 DIAGNOSIS — M109 Gout, unspecified: Secondary | ICD-10-CM | POA: Diagnosis present

## 2021-02-22 HISTORY — PX: ENDARTERECTOMY, CAROTID ARTERY: SHX3831

## 2021-02-22 LAB — GLUCOSE WHOLE BLOOD - POCT
Whole Blood Glucose POCT: 213 mg/dL — ABNORMAL HIGH (ref 70–100)
Whole Blood Glucose POCT: 157 mg/dL — ABNORMAL HIGH (ref 70–100)
Whole Blood Glucose POCT: 119 mg/dL — ABNORMAL HIGH (ref 70–100)

## 2021-02-22 SURGERY — ENDARTERECTOMY, CAROTID ARTERY
Anesthesia: Anesthesia General | Site: Neck | Laterality: Right | Wound class: Clean

## 2021-02-22 MED ORDER — SUGAMMADEX SODIUM 200 MG/2ML IV SOLN
INTRAVENOUS | Status: AC
Start: 2021-02-22 — End: 2021-02-22
  Filled 2021-02-22: qty 2

## 2021-02-22 MED ORDER — OXYCODONE HCL 5 MG PO TABS
5.0000 mg | ORAL_TABLET | Freq: Once | ORAL | Status: AC | PRN
Start: 2021-02-22 — End: 2021-02-22

## 2021-02-22 MED ORDER — CEFAZOLIN SODIUM 2 G IJ SOLR
INTRAMUSCULAR | Status: AC
Start: 2021-02-22 — End: ?
  Filled 2021-02-22: qty 2000

## 2021-02-22 MED ORDER — SACUBITRIL-VALSARTAN 49-51 MG PO TABS
1.0000 | ORAL_TABLET | Freq: Two times a day (BID) | ORAL | Status: DC
Start: 2021-02-22 — End: 2021-02-23
  Administered 2021-02-22 – 2021-02-23 (×2): 1 via ORAL
  Filled 2021-02-22 (×2): qty 1

## 2021-02-22 MED ORDER — CARVEDILOL 6.25 MG PO TABS
12.5000 mg | ORAL_TABLET | Freq: Two times a day (BID) | ORAL | Status: DC
Start: 2021-02-22 — End: 2021-02-23
  Administered 2021-02-22 – 2021-02-23 (×2): 12.5 mg via ORAL
  Filled 2021-02-22 (×2): qty 2

## 2021-02-22 MED ORDER — PROPOFOL 10 MG/ML IV EMUL (WRAP)
INTRAVENOUS | Status: AC
Start: 2021-02-22 — End: ?
  Filled 2021-02-22: qty 40

## 2021-02-22 MED ORDER — ACETAMINOPHEN 325 MG PO TABS
650.0000 mg | ORAL_TABLET | ORAL | Status: DC | PRN
Start: 2021-02-22 — End: 2021-02-23
  Administered 2021-02-23: 650 mg via ORAL
  Filled 2021-02-22: qty 2

## 2021-02-22 MED ORDER — ROCURONIUM BROMIDE 10 MG/ML IV SOLN (WRAP)
INTRAVENOUS | Status: DC | PRN
Start: 2021-02-22 — End: 2021-02-22
  Administered 2021-02-22: 20 mg via INTRAVENOUS
  Administered 2021-02-22: 30 mg via INTRAVENOUS
  Administered 2021-02-22: 20 mg via INTRAVENOUS

## 2021-02-22 MED ORDER — LIDOCAINE HCL (PF) 1 % IJ SOLN
INTRAMUSCULAR | Status: AC
Start: 2021-02-22 — End: 2021-02-22
  Filled 2021-02-22: qty 30

## 2021-02-22 MED ORDER — ASPIRIN 81 MG PO TBEC
81.0000 mg | DELAYED_RELEASE_TABLET | Freq: Every day | ORAL | Status: DC
Start: 2021-02-23 — End: 2021-02-23
  Administered 2021-02-23: 81 mg via ORAL
  Filled 2021-02-22: qty 1

## 2021-02-22 MED ORDER — HEPARIN SODIUM (PORCINE) 1000 UNIT/ML IJ SOLN
INTRAMUSCULAR | Status: DC | PRN
Start: 2021-02-22 — End: 2021-02-22
  Administered 2021-02-22: 5000 [IU] via INTRAVENOUS

## 2021-02-22 MED ORDER — HEPARIN SODIUM (PORCINE) 5000 UNIT/ML IJ SOLN
INTRAMUSCULAR | Status: DC | PRN
Start: 2021-02-22 — End: 2021-02-22
  Administered 2021-02-22: 5000 [IU]

## 2021-02-22 MED ORDER — PROPOFOL INFUSION 10 MG/ML
INTRAVENOUS | Status: DC | PRN
Start: 2021-02-22 — End: 2021-02-22

## 2021-02-22 MED ORDER — FENTANYL CITRATE (PF) 50 MCG/ML IJ SOLN (WRAP)
INTRAMUSCULAR | Status: DC | PRN
Start: 2021-02-22 — End: 2021-02-22
  Administered 2021-02-22: 25 ug via INTRAVENOUS
  Administered 2021-02-22: 50 ug via INTRAVENOUS
  Administered 2021-02-22: 25 ug via INTRAVENOUS

## 2021-02-22 MED ORDER — OXYCODONE-ACETAMINOPHEN 5-325 MG PO TABS
2.0000 | ORAL_TABLET | ORAL | Status: DC | PRN
Start: 2021-02-22 — End: 2021-02-23

## 2021-02-22 MED ORDER — DEXTROSE 5% IV BOLUS
250.0000 mL | INTRAVENOUS | Status: DC | PRN
Start: 2021-02-22 — End: 2021-02-23

## 2021-02-22 MED ORDER — HYDROMORPHONE HCL 0.5 MG/0.5 ML IJ SOLN
0.2000 mg | INTRAMUSCULAR | Status: DC | PRN
Start: 2021-02-22 — End: 2021-02-22

## 2021-02-22 MED ORDER — CEFAZOLIN SODIUM 1 G IJ SOLR
2.0000 g | Freq: Once | INTRAMUSCULAR | Status: AC
Start: 2021-02-22 — End: 2021-02-22
  Administered 2021-02-22: 2 g via INTRAVENOUS

## 2021-02-22 MED ORDER — SODIUM CHLORIDE 0.9 % IV SOLN
INTRAVENOUS | Status: DC | PRN
Start: 2021-02-22 — End: 2021-02-22
  Administered 2021-02-22: 30 ug/min via INTRAVENOUS

## 2021-02-22 MED ORDER — OXYCODONE-ACETAMINOPHEN 5-325 MG PO TABS
1.0000 | ORAL_TABLET | ORAL | Status: DC | PRN
Start: 2021-02-22 — End: 2021-02-23

## 2021-02-22 MED ORDER — PROPOFOL 10 MG/ML IV EMUL (WRAP)
INTRAVENOUS | Status: DC | PRN
Start: 2021-02-22 — End: 2021-02-22
  Administered 2021-02-22: 20 mg via INTRAVENOUS
  Administered 2021-02-22: 30 mg via INTRAVENOUS
  Administered 2021-02-22: 50 mg via INTRAVENOUS
  Administered 2021-02-22: 30 mg via INTRAVENOUS
  Administered 2021-02-22: 50 mg via INTRAVENOUS
  Administered 2021-02-22: 20 mg via INTRAVENOUS

## 2021-02-22 MED ORDER — ROCURONIUM BROMIDE 50 MG/5ML IV SOLN
INTRAVENOUS | Status: AC
Start: 2021-02-22 — End: ?
  Filled 2021-02-22: qty 5

## 2021-02-22 MED ORDER — DEXTROSE 50 % IV SOLN
25.0000 g | INTRAVENOUS | Status: DC | PRN
Start: 2021-02-22 — End: 2021-02-23

## 2021-02-22 MED ORDER — VITAMIN B-12 500 MCG PO TABS
1000.0000 ug | ORAL_TABLET | Freq: Every day | ORAL | Status: DC
Start: 2021-02-23 — End: 2021-02-23
  Administered 2021-02-23: 1000 ug via ORAL
  Filled 2021-02-22: qty 2

## 2021-02-22 MED ORDER — HEPARIN SODIUM (PORCINE) 5000 UNIT/ML IJ SOLN
INTRAMUSCULAR | Status: AC
Start: 2021-02-22 — End: 2021-02-22
  Filled 2021-02-22: qty 1

## 2021-02-22 MED ORDER — GELATIN ABSORBABLE 100 EX MISC
CUTANEOUS | Status: DC | PRN
Start: 2021-02-22 — End: 2021-02-22
  Administered 2021-02-22: 1 via TOPICAL

## 2021-02-22 MED ORDER — ONDANSETRON HCL 4 MG/2ML IJ SOLN
4.0000 mg | Freq: Once | INTRAMUSCULAR | Status: DC | PRN
Start: 2021-02-22 — End: 2021-02-22

## 2021-02-22 MED ORDER — OXYCODONE HCL 5 MG PO TABS
ORAL_TABLET | ORAL | Status: AC
Start: 2021-02-22 — End: 2021-02-22
  Administered 2021-02-22: 5 mg via ORAL
  Filled 2021-02-22: qty 1

## 2021-02-22 MED ORDER — METOCLOPRAMIDE HCL 5 MG/ML IJ SOLN
10.0000 mg | Freq: Once | INTRAMUSCULAR | Status: DC | PRN
Start: 2021-02-22 — End: 2021-02-22

## 2021-02-22 MED ORDER — ONDANSETRON HCL 4 MG/2ML IJ SOLN
4.0000 mg | Freq: Three times a day (TID) | INTRAMUSCULAR | Status: DC | PRN
Start: 2021-02-22 — End: 2021-02-23

## 2021-02-22 MED ORDER — GLUCAGON 1 MG IJ SOLR (WRAP)
1.0000 mg | INTRAMUSCULAR | Status: DC | PRN
Start: 2021-02-22 — End: 2021-02-23

## 2021-02-22 MED ORDER — INSULIN LISPRO 100 UNIT/ML SOLN (WRAP)
1.0000 [IU] | Freq: Every evening | Status: DC
Start: 2021-02-22 — End: 2021-02-23

## 2021-02-22 MED ORDER — FENTANYL CITRATE (PF) 50 MCG/ML IJ SOLN (WRAP)
25.0000 ug | INTRAMUSCULAR | Status: DC | PRN
Start: 2021-02-22 — End: 2021-02-22

## 2021-02-22 MED ORDER — THROMBIN 5000 UNITS EX SOLR (WRAP)
CUTANEOUS | Status: DC | PRN
Start: 2021-02-22 — End: 2021-02-22
  Administered 2021-02-22: 10000 [IU] via TOPICAL

## 2021-02-22 MED ORDER — THROMBIN 5000 UNITS EX SOLR (WRAP)
CUTANEOUS | Status: AC
Start: 2021-02-22 — End: 2021-02-22
  Filled 2021-02-22: qty 10000

## 2021-02-22 MED ORDER — SODIUM CHLORIDE 0.9% BAG (IRRIGATION USE)
INTRAVENOUS | Status: DC | PRN
Start: 2021-02-22 — End: 2021-02-22
  Administered 2021-02-22: 1000 mL

## 2021-02-22 MED ORDER — FENTANYL CITRATE (PF) 50 MCG/ML IJ SOLN (WRAP)
INTRAMUSCULAR | Status: AC
Start: 2021-02-22 — End: ?
  Filled 2021-02-22: qty 2

## 2021-02-22 MED ORDER — DEXTROSE 10 % IV BOLUS
25.0000 g | INTRAVENOUS | Status: DC | PRN
Start: 2021-02-22 — End: 2021-02-23

## 2021-02-22 MED ORDER — LIDOCAINE HCL (PF) 2 % IJ SOLN
INTRAMUSCULAR | Status: AC
Start: 2021-02-22 — End: ?
  Filled 2021-02-22: qty 5

## 2021-02-22 MED ORDER — BUPIVACAINE HCL (PF) 0.25 % IJ SOLN
INTRAMUSCULAR | Status: AC
Start: 2021-02-22 — End: 2021-02-22
  Filled 2021-02-22: qty 40

## 2021-02-22 MED ORDER — INSULIN LISPRO 100 UNIT/ML SOLN (WRAP)
1.0000 [IU] | Freq: Three times a day (TID) | Status: DC
Start: 2021-02-23 — End: 2021-02-23
  Administered 2021-02-23: 3 [IU] via SUBCUTANEOUS
  Filled 2021-02-22: qty 9

## 2021-02-22 MED ORDER — ONDANSETRON HCL 4 MG/2ML IJ SOLN
INTRAMUSCULAR | Status: AC
Start: 2021-02-22 — End: ?
  Filled 2021-02-22: qty 2

## 2021-02-22 MED ORDER — VITAMINS/MINERALS PO TABS
1.0000 | ORAL_TABLET | Freq: Every day | ORAL | Status: DC
Start: 2021-02-23 — End: 2021-02-23
  Administered 2021-02-23: 1 via ORAL
  Filled 2021-02-22: qty 1

## 2021-02-22 MED ORDER — ATORVASTATIN CALCIUM 80 MG PO TABS
80.0000 mg | ORAL_TABLET | Freq: Every evening | ORAL | Status: DC
Start: 2021-02-22 — End: 2021-02-23
  Administered 2021-02-22: 80 mg via ORAL
  Filled 2021-02-22: qty 1

## 2021-02-22 MED ORDER — SODIUM CHLORIDE 0.9 % IV SOLN
INTRAVENOUS | Status: AC | PRN
Start: 2021-02-22 — End: 2021-02-22
  Administered 2021-02-22: 500 mL

## 2021-02-22 MED ORDER — ACETAMINOPHEN 10 MG/ML IV SOLN
INTRAVENOUS | Status: DC | PRN
Start: 2021-02-22 — End: 2021-02-22
  Administered 2021-02-22: 1000 mg via INTRAVENOUS

## 2021-02-22 SURGICAL SUPPLY — 108 items
ADHESIVE LIQUID WATERPROOF VIAL PREP NONSTAIN MASTISOL STYRAX GUM (Skin Closure) ×1 IMPLANT
ADHESIVE LQ STYRAX GUM MASTIC ALC MTHY (Skin Closure) ×2 IMPLANT
APPLICATOR CHLORAPREP 26 ML 70% ISOPROPYL ALCOHOL 2% CHLORHEXIDINE (Applicator) ×1 IMPLANT
APPLICATOR PRP 70% ISPRP 2% CHG 26ML (Applicator) ×2 IMPLANT
BULB DRAINAGE LIGHTWEIGHT LOW LEVEL (Drain) ×1 IMPLANT
BULB DRAINAGE LIGHTWEIGHT LOW LEVEL SUCTION RELIAVAC SILICONE 100 CC (Drain) ×1 IMPLANT
BULB DRN SIL 100CC LF STRL LTWT LO LVL (Drain) ×1
COVER FLEXIBLE LIGHT HANDLE PLASTIC GREEN (Procedure Accessories) ×2 IMPLANT
COVER FLEXIBLE MEDLINE LIGHT HANDLE (Procedure Accessories) ×2 IMPLANT
COVER LGHT HNDL PLS LF STRL FLXB DISP (Procedure Accessories) ×2
DISSECTOR LAPAROSCOPIC L.56 IN X W.25 IN (Instrument) ×1 IMPLANT
DISSECTOR LAPAROSCOPIC L.56 IN X W.25 IN C5 HOLDER SPONGE XRAY (Instrument) ×1 IMPLANT
DISSECTOR LAPSCP CTTN GZE FM KTNR (Instrument) ×1
DRAIN INCS SIL FULL FLUT RND BARD 10FR (Drain) ×1
DRAIN OD10 FR 4 FREE FLOW CHANNEL (Drain) ×1 IMPLANT
DRAIN OD10 FR 4 FREE FLOW CHANNEL RADIOPAQUE BARD L1/8 IN INCISION (Drain) ×1 IMPLANT
DRAPE INST 20X16IN LF STRL MAG DISP GRN (Drape) ×1
DRAPE INSTRUMENT L20 IN X W16 IN MAGNETIC GREEN (Drape) ×1 IMPLANT
DRAPE INSTRUMENT MAGNETIC L20 IN X W16 (Drape) ×1 IMPLANT
DRAPE SRG IOBN 23X17IN STRL 2 INCS FLM (Drape) ×1
DRAPE SRG SM STRDRP 25X22IN LF STRL ADH (Drape) ×1
DRAPE SRG SMS PRXM 26X15IN LF STRL UTL (Drape) ×4 IMPLANT
DRAPE SRG SMS T PRXM 121X102X77IN LF (Drape) ×1
DRAPE SRG TBRN CNVRT 122X100X77IN LF (Drape) ×1
DRAPE SURGICAL 2 INCISE FILM (Drape) ×1 IMPLANT
DRAPE SURGICAL ADHESIVE APERTURE FLUID (Drape) ×1 IMPLANT
DRAPE SURGICAL ADHESIVE APERTURE FLUID CONTROL L25 IN X W22 IN (Drape) ×1 IMPLANT
DRAPE SURGICAL DIAMOND FENESTRATE HOOK (Drape) ×1 IMPLANT
DRAPE SURGICAL REINFORCE FENESTRATE CORD (Drape) ×1 IMPLANT
DRAPE SURGICAL REINFORCE FENESTRATE CORD HOLDER TAB PAD L122 IN X W100 (Drape) ×1 IMPLANT
DRAPE SURGICAL UTILITY TAPE ADHESIVE (Drape) ×2
DRAPE SURGICAL UTILITY TAPE ADHESIVE STRIP L26 IN X W15 IN MEDLINE SMS (Drape) ×2 IMPLANT
DRAPE THYROID SURGICAL DIAMOND FENESTRATE HOOK LOOP L121 IN X W102 IN (Drape) ×1 IMPLANT
DRESSING TRANSPARENT L2 3/4 IN X W2 3/8 (Dressing) ×1 IMPLANT
DRESSING TRANSPARENT L2 3/4 IN X W2 3/8 IN POLYURETHANE ADHESIVE (Dressing) ×1 IMPLANT
DRESSING TRANSPARENT L4 3/4 IN X W4 IN (Dressing) ×2 IMPLANT
DRESSING TRANSPARENT L4 3/4 IN X W4 IN POLYURETHANE ADHESIVE (Dressing) ×2 IMPLANT
DRESSING TRNS PU STD TGDRM 2.75INX2 3/8 (Dressing) ×1
DRESSING TRNS PU STD TGDRM 4.75X4IN LF (Dressing) ×2
ELECTRODE ADULT PATIENT RETURN L9 FT REM POLYHESIVE ACRYLIC FOAM (Procedure Accessories) ×1 IMPLANT
ELECTRODE ELECTROSURGICAL BLADE L70 MM (Endoscopic Supplies) ×1 IMPLANT
ELECTRODE ELECTROSURGICAL BLADE L70 MM NEPTUNE E-SEP COATED (Endoscopic Supplies) ×1 IMPLANT
ELECTRODE ESURG BLDE NPTN E-SEP 70MM LF (Endoscopic Supplies) ×1
ELECTRODE PATIENT RETURN L9 FT VALLEYLAB (Procedure Accessories) ×1 IMPLANT
ELECTRODE PT RTN RM PHSV ACRL FM C30- LB (Procedure Accessories) ×1
GLOVE SRG PLISPRN 8 BGL PI MIC LF STRL (Glove) ×2
GLOVE SURGICAL 8 BIOGEL PI MICRO POWDER (Glove) ×2 IMPLANT
GLOVE SURGICAL 8 BIOGEL PI MICRO POWDER FREE BEAD CUFF MICRO ROUGHENED (Glove) ×2 IMPLANT
GOWN SRG POLY XL ASTND LF STRL LVL 4 (Gown) ×1
GOWN STANDARD LEVEL 4 PREMIUM MEDIUM 665100 (Gown) ×2 IMPLANT
GOWN STNDRD XXLG (W/ PAPR TWL) (Gown) ×4 IMPLANT
GOWN SURGICAL XL POLY BLUE LEVEL 4 (Gown) ×1 IMPLANT
GOWN SURGICAL XL POLY BLUE LEVEL 4 IMPERVIOUS REINFORCE SET IN SLEEVE (Gown) ×1 IMPLANT
INSERT CLAMP L33 MM ATRAUMATIC OCCLUSION (Clips) IMPLANT
INSERT CLAMP L33 MM ATRAUMATIC OCCLUSION SOFT TRACTION FOGARTY SMALL (Clips) IMPLANT
INSERT CLP SM FGRTY 33MM STRL ATRM OCL (Clips)
KIT  BOLT MONITOR VENTRICULAR (Kits) ×2 IMPLANT
KIT VASCULAR MAJOR ~~LOC~~ (Pack) ×1 IMPLANT
LOOP VESSEL MAXI ELLIPTICAL L406 MM X W1 (Procedure Accessories) ×1 IMPLANT
LOOP VESSEL MAXI ELLIPTICAL L406 MM X W1 MM RED 2 POUCH RADIOPAQUE (Procedure Accessories) ×1 IMPLANT
LOOP VSL SIL MAXI ELIP STERION 406X1MM (Procedure Accessories) ×1
NEEDLE HPO SS PP THNWL BD 19GA 1.5IN LF (Needles) ×4 IMPLANT
PATCH VASCULAR L8 CM X W.8 CM THK.45 MM TAPER BIOLOGIC XENOSURE BOVINE (Patch) ×1 IMPLANT
PATCH XENOSURE 0.8X8CM (Patch) ×2 IMPLANT
POUCH 11X7IN 2 ADHSV STRIP 2 COMPARTMENT STERI-DRAPE INSTRUMENT PLSTC (Drape) ×1 IMPLANT
POUCH INST PLS STRDRP 11X7IN STRL 2 ADH (Drape) ×1
POUCH L11 IN X W7 IN 2 ADHESIVE STRIP 2 (Drape) ×1 IMPLANT
SET SRGBSN LF STRL MAJ DISP (Kits) ×1
SET SURGICAL BASIN MAJOR (Kits) ×1 IMPLANT
SET SURGICAL BASIN MAJOR MEDLINE INDUSTRIES, INC. (Kits) ×1 IMPLANT
SOLUTION IRR 0.9% NACL 1000ML LF STRL (Irrigation Solutions) ×1
SOLUTION IRRIGATION 0.9% SODIUM CHLORIDE (Irrigation Solutions) ×1 IMPLANT
SOLUTION IRRIGATION 0.9% SODIUM CHLORIDE 1000 ML PLASTIC POUR BOTTLE (Irrigation Solutions) ×1 IMPLANT
SPEAR EYE ABSORBENT TRIANGLE HEAD (Sponge) ×1 IMPLANT
SPEAR EYE ABSORBENT TRIANGLE HEAD MALLEABLE HANDLE WECK-CEL CELLULOSE (Sponge) ×1 IMPLANT
SPEAR EYE CLU PP WKCL LF STRL ABS 3ANG (Sponge) ×1
SPONGE GAUZE L4 IN X W4 IN 12 PLY CURITY (Sponge) ×1 IMPLANT
SPONGE GZE CTTN CRTY 4X4IN LF STRL 12 (Sponge) ×1
STRIP SKIN CLOSURE L4 IN X W1/2 IN (Dressing) ×1 IMPLANT
STRIP SKIN CLOSURE L4 IN X W1/2 IN REINFORCE STERI-STRIP POLYESTER (Dressing) ×1 IMPLANT
STRIP SKNCLS PLSTR STRSTRP 4X.5IN LF (Dressing) ×1
SUTURE ABS 3-0 RB1 MNCRL 27IN MFL UD (Suture)
SUTURE ABS 3-0 SH VCL 27IN BRD COAT UD (Suture) ×2
SUTURE ABS 4-0 PS2 MNCRL MTPS 18IN MFL (Suture) ×1
SUTURE COATED VICRYL 3-0 SH L27 IN BRAID (Suture) ×2 IMPLANT
SUTURE MONOCRYL 3-0 RB-1 L27 IN (Suture) IMPLANT
SUTURE MONOCRYL 3-0 RB-1 L27 IN MONOFILAMENT UNDYED ABSORBABLE (Suture) IMPLANT
SUTURE MONOCRYL 4-0 PS-2 L18 IN (Suture) ×1 IMPLANT
SUTURE MONOCRYL 4-0 PS-2 L18 IN MONOFILAMENT UNDYED ABSORBABLE (Suture) ×1 IMPLANT
SUTURE NABSB 6-0 RB-2 PRLN 30IN 2 ARM (Suture)
SUTURE NABSB 7-0 BV-1 PRLN 24IN 2 ARM (Suture)
SUTURE NABSB SLK 2-0 SH PRMHND 30IN BRD (Suture) ×1
SUTURE PROLENE BLUE 6-0 RB-2 L30 IN 2 (Suture) IMPLANT
SUTURE PROLENE BLUE 6-0 RB-2 L30 IN 2 ARM MONOFILAMENT NONABSORBABLE (Suture) IMPLANT
SUTURE PROLENE BLUE 7-0 BV-1 L24 IN 2 (Suture) IMPLANT
SUTURE PROLENE BLUE 7-0 BV-1 L24 IN 2 ARM MONOFILAMENT NONABSORBABLE (Suture) IMPLANT
SUTURE SILK PERMA HAND BLACK 2-0 SH L30 (Suture) ×1 IMPLANT
SUTURE SILK PERMA HAND BLACK 2-0 SH L30 IN BRAID NONABSORBABLE (Suture) ×1 IMPLANT
SYRINGE 10 ML GRADUATE NONPYROGENIC DEHP (Syringes, Needles) ×2 IMPLANT
SYRINGE 10 ML GRADUATE NONPYROGENIC DEHP FREE PVC FREE LOK MEDICAL (Syringes, Needles) ×2 IMPLANT
SYRINGE 30 ML CONCENTRIC TIP GRADUATE (Syringes, Needles) ×1 IMPLANT
SYRINGE 30 ML CONCENTRIC TIP GRADUATE NONPYROGENIC DEHP FREE LOK (Syringes, Needles) ×1 IMPLANT
SYRINGE MED 10ML LL LF STRL GRAD N-PYRG (Syringes, Needles) ×2
SYRINGE MED 30ML LL LF STRL CONC TIP (Syringes, Needles) ×1
TRAY MAJOR VASCULAR ~~LOC~~ (Pack) ×1
TRAY STERILE US GEL 2OZ (Procedure Accessories) ×2 IMPLANT
TRAY STERILE US GEL 2OZ LHCL0030 (Procedure Accessories) ×1 IMPLANT
TRAY SURGICAL VASCULAR MAJOR ~~LOC~~ (Pack) ×1 IMPLANT

## 2021-02-22 NOTE — Transfer of Care (Cosign Needed)
Anesthesia Transfer of Care Note    [name]    Procedures performed: Procedure(s):  RIGHT CAROTID ARTERY ENDARTERECTOMY WITH NEUROMONITORING    Anesthesia type: General ETT    Patient location:Phase I PACU    Last vitals:     BP: 152/86  Pulse: 70  Resp: 22  Temp: 97.8 F  SpO2: 99%    Post pain: Patient not complaining of pain, continue current therapy      Mental Status:awake    Respiratory Function: tolerating face mask    Cardiovascular: stable    Nausea/Vomiting: patient not complaining of nausea or vomiting    Hydration Status: adequate    Post assessment: no apparent anesthetic complications    Signed by: Bonney Leitz, MD  02/22/21 3:02 PM

## 2021-02-22 NOTE — Brief Op Note (Signed)
BRIEF OP NOTE    [facility]   VASCULAR & ENDOVASCULAR SURGERY    Date Time: 02/22/21 12:59 PM    Patient Name:   Tara Perry    Date of Operation:   02/22/2021    Providers Performing:   Surgeon(s) and Role:     * Bade, Maseer A, MD - Primary     * [name], MD - Fellow - Assisting    Operative Procedure:   Procedure(s):  RIGHT CAROTID ARTERY ENDARTERECTOMY WITH NEUROMONITORING    Preoperative Diagnosis:   Pre-Op Diagnosis Codes:     * Carotid stenosis, right [I65.21]    Postoperative Diagnosis:   * No post-op diagnosis entered *    Anesthesia:   Choice    Implants:     Implant Name Type Inv. Item Serial No. Manufacturer Lot No. LRB No. Used Action   PATCH XENOSURE 0.8X8CM - [id number] Patch PATCH XENOSURE 0.8X8CM  [employer] [id number] Right 1 Implanted       Drains:   Drain #1: [employer] Round 10 Fr    Specimens:   Right carotid bifurcation plaque    Findings:   Plaque noted in right carotid bifurcation. Patient spontaneously moving all extremities after extubation.    Wound Class:   Clean    Complications:   None    Signed by: [name], MD  [facility]    Maseer Lutricia Horsfall, MD, FACS, RPVI  Vascular Surgery & Endovascular Intervention  [facility]  Staff Surgeon, [facility] [place]  [address], [place], [place] [address]  [address], [place], [place] [address]  Tel: [phone] / Fax: [phone]  [email or web address]    This note was generated by the Epic EMR system/ Dragon speech recognition and may contain inherent errors or omissions not intended by the user. Grammatical errors, random word insertions, deletions, pronoun errors and incomplete sentences are occasional consequences of this technology due to software limitations. Not all errors are caught or corrected. If there are questions or concerns about the content of this note or information contained within the body of this dictation they should be addressed  directly with the author for clarification.

## 2021-02-22 NOTE — H&P (Signed)
History and Physical currently available, in Epic or on paper, has been reviewed, and there are no major changes. Pt. seen and examined by me prior to procedure.     Patient neuro intact, tongue midline, no facial droop noted on pre-op exam.    Tara Nottingham, MD    Tara Ammon Lutricia Horsfall, MD, FACS, RPVI  Vascular [facility], Endovascular Intervention & Vascular Medicine    [name] / [name]  Director - M2S Vascular Quality Initiative & Vascular Laboratory  Assistant Professor of [facility] [employer] [facility].   Tel: [phone] / Fax: [phone]  Cell:  [phone]  [email or web address] Tara Perry             Chief Complaint   Patient presents with    Follow-up       F/U progressing Right internal carotid artery stenosis, Review MRA results          History of Present Illness      Tara Perry is a 75 y.o. female who returns the office for follow-up evaluation.  This appointment is to review a MRA to evaluate progressive right internal carotid artery disease.      She reports known asymptomatic carotid stenosis that has been monitored for the last few years until  worsening on carotid duplex which prompted additional imaging with MRA on 06/17/2019 that revealed a 75% right internal carotid stenosis, however there was significant motion artifact.  She has known CKD stage III with recent GFR of 31 and therefore CTA was not recommended.       She was followed closely with carotid duplex. At her prior office visit in October 2021, duplex demonstrated increased peak systolic velocity in the right internal carotid artery to 254 cm/s.  This is increased from 236 cm/s in the duplex performed in 05/07/2019. Additionally the ICA to CCA ratio has increased to 2.9, from 2.5 in 04/2019     In the interval, she reports MRA completion was delayed due to her severe claustrophobia. She also relates a history of left ear pain that is intermittent in nature and relieved with warm  compress and Tylenol. She was evaluated in the ED in May 2022 because pain extended from the left posterior ear to the left cheek. She has a history of tinnitus of the left ear since 2021. She denies any amarousis fugax, unilateral weakness, slurred speech, or facial droop. No imaging performed during her May 2022 ED visit. She has not been evaluated by ENT.     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  She has history of DVT in the left upper extremity after its placement and has been on Eliquis therapy.     Other past medical history includes diabetes mellitus, gout and cardiomyopathy.     MRA performed on 12/14/2020 demonstrated extensive atherosclerotic plaque right carotid bifurcation resulting in approximately 80-90% stenosis of the proximal right internal carotid artery. Moderate atherosclerotic plaque at the left carotid bifurcation resulting in approximately 60-70% stenosis.      Past Medical History      Past Medical History        Past Medical History:   Diagnosis Date    Anemia June 2020    Atrophic vaginitis 08/30/2018    Bilateral cataracts       I have implants    Cardiac arrest 08/17/2018     VF  arrest. She received 3 shocks, 1 round of epi with successful ROSC. 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    CKD (chronic kidney disease), stage III      Congestive heart failure      Detached retina, right 08/24/2018     per patient, he only sees peripheral view    Diabetes mellitus 1992    Encounter for blood transfusion      Gout spondylitis      Gout synovitis      Hypertension      LBBB (left bundle branch block)       LBBB chronic    NICM (nonischemic cardiomyopathy) 08/2018     NICM, EF 20%    Respiratory failure 08/2018     Hypoxic/anoxic respiratory failure, extubated 08/20/2018    Type 2 diabetes mellitus, controlled      Viral cardiomyopathy 2008            Past Surgical History         Past Surgical History:   Procedure Laterality Date     COLONOSCOPY   2002     normal    EGD N/A 09/01/2018     Procedure: EGD;  Surgeon: Lilia Pro, MD;  Location: [facility];  Service: Gastroenterology;  Laterality: N/A;    EXPLORATORY LAPAROTOMY        EYE [place]        ICD IMPLANT BIV N/A 08/26/2018     Procedure: ICD IMPLANT BIV;  Surgeon: Len Childs, MD;  Location: [facility];  Service: Cardiovascular;  Laterality: N/A;  medtronic    INSERT / REPLACE / REMOVE PACEMAKER         Difibrillator August 26, 2018    LHC W/ CORONARY ANGIOS AND LV Left 08/21/2018     Procedure: LHC W/ CORONARY ANGIOS AND LV;  Surgeon: Burna Forts, MD;  Location: [facility];  Service: Cardiovascular;  Laterality: Left;    SMALL INTESTINE [place]   November 2014            Allergies           Allergies   Allergen Reactions    Shellfish-Derived Products Anaphylaxis         Medications         Current Outpatient Medications:     apixaban (Eliquis) 2.5 MG, Take 1 tablet (2.5 mg total) by mouth every 12 (twelve) hours, Disp: 180 tablet, Rfl: 3    aspirin EC 81 MG EC tablet, Take 81 mg by mouth daily, Disp: , Rfl:     atorvastatin (LIPITOR) 40 MG tablet, Take 2 tablets (80 mg total) by mouth nightly, Disp: 180 tablet, Rfl: 3    carvedilol (Coreg) 12.5 MG tablet, Take 1 tablet (12.5 mg total) by mouth 2 (two) times daily with meals, Disp: 180 tablet, Rfl: 3    glimepiride (AMARYL) 2 MG tablet, Take 2 mg by mouth daily, Disp: , Rfl:     Jardiance 25 MG tablet, 25 mg every morning  , Disp: , Rfl:     metFORMIN (GLUCOPHAGE) 500 MG tablet, Take 1 tablet (500 mg total) by mouth 2 (two) times daily Morning and night (Patient taking differently: Take 500 mg by mouth 2 (two) times daily with meals), Disp: , Rfl:     Multiple Vitamins-Minerals (CENTRUM PO), Take by mouth daily, Disp: , Rfl:     sacubitril-valsartan (Entresto) 49-51 MG Tab per tablet, Take 1 tablet by mouth 2 (  two) times daily, Disp: 180 tablet, Rfl: 3    vitamin B-12 (CYANOCOBALAMIN) 100 MCG tablet, Take 1,000 mcg by mouth  daily, Disp: , Rfl:      Family History         Family History   Problem Relation Age of Onset    Cancer Father 29         bladder    Diabetes Father      Heart attack Father              Social History            Socioeconomic History    Marital status: Widowed   Tobacco Use    Smoking status: Never    Smokeless tobacco: Never   Vaping Use    Vaping Use: Never used   Substance and Sexual Activity    Alcohol use: Not Currently       Alcohol/week: 0.0 standard drinks    Drug use: No    Sexual activity: Not Currently         Review of Systems   Constitutional: Denies weight changes, fever, chills, weakness or night sweats.   Neurologic: see HPI  HEENT:  Denies visual changes, hearing loss, difficulty swallowing, snoring or hoarseness.  Respiratory: Denies cough, wheezing, SOB or hemoptysis.  Cardiovascular: See HPI. Denies chest pain, cardiac murmur, irregular heartbeat, dyspnea, lower extremity swelling, lower extremity claudication.    Gastrointestinal:Denies abdominal pain, nausea, vomiting and diarrhea  Genitourinary: Denies for dysuria or hematuria.   Hematologic/Lymphatic:  Denies lymphadenopathy, bleeding disorders, easy bruising or anemia.   Endocrine:  See HPI. Denies thyroid disease, polydipsia, polyuria, heat or cold intolerance.    Musculoskeletal:  Cervical neck pain. Denies joint pain, back pain, muscle pain or muscle weakness.   Skin: Denies skin lesions, rashes, hives or hair loss on legs.    Psychiatric:  Denies depression, anxiety or panic attacks.    All other systems were reviewed and are negative except what is stated in the HPI     Physical Exam      Vitals        Vitals:     12/20/20 1242 12/20/20 1248   BP: 128/71 120/74   BP Site: Right arm Left arm   Patient Position: Sitting Sitting   Cuff Size: Medium Medium   Pulse: 69 68   Temp: 97.7 F (36.5 C)     TempSrc: Temporal     SpO2: 98%     Weight: 64 kg (141 lb)     Height: 1.626 m (5\' 4" )              Body mass index is 24.2 kg/m.      General:  Patient appears their stated age, well-nourished.  Alert and in no apparent distress.  HEENT:  Normpcephalic.  EOMI.  Tenderness behind the left ear, no swelling noted on examination.   Neck:  No JVD.  Bilateral carotid bruits.   Lungs: Respiratory effort unlabored, chest expansion symmetric.  Cardiac: S1, S2. RRR, no murmurs, rubs or gallops.  Abd: Soft, nondistended, nontender.    Extremities:Full ROM, symmetric, warm, no ischemic changes, ulceration,  gangrene or edema.   Pulses: 2+ Bilateral radial and posterior tibial artery pulses  Skin: Warm, no varicosities.    Neuro: Good insight and judgment, oriented to person, place, and time , gross motor and sensory intact. Stable gait.       Imaging   Please see HPI.  Assessment and Plan   75 y/o female with a history of bilateral carotid artery stenosis, now with progression of the R ICA 80-99%. Patient relates a history of left posterior ear pain that has been intermittent in nature. Pain is relieved with tylenol and warm compress. She sought medical attention at the ER in May 2022, no imaging completed at that time. But she relates pain did extend into her cheek at that time but no physical  findings concerning of CVA or carotid dissection. Patient was advised to follow up with her PCP. She has not been evaluated by an ENT provider. She denies any current symptoms of lateralizing neurologic ischemia or any history of TIA, CVA or amaurosis fugax.     - MRA demonstrated 80-99% stenosis of the R ICA, L ICA 60-70% stenosis  - Patient currently asymptomatic, and discussed with her pain behind the ear is less likely related to carotid stenosis  - Recommend that patient undergoes right carotid endarterectomy for treatment of high grade carotid artery stenosis with EEG and neuromonitoring  - Patient will obtain cardiac clearance with Dr. Nash Shearer  - The indications, benefits and risks have been explained to the patient in the presence of the  nurse/resident/family.  Alternative therapies including non-operative, conservative therapies were discussed with the patient.  Risks including but not limited to bleeding, infection, nerve damage, damage to adjacent structures, need for adjunctive procedures, lack of signs and symptoms resolution, inability to complete procedure, retention of foreign bodies, fire/skin burn, heart attack, multisystem organ failure, stroke and death were explained. No guarantees to surgical outcome were given or implied. The patient demonstrated understanding, and acceptance of the risks, and consented to proceed forth with the procedure.  All of the patient's questions were answered to the patient's satisfaction.     - Our office will contact patient to schedule  - She will continue aspirin and statin, hold Eliquis (hx of UE DVT) 2 days prior to [place]     I hope this plan of care meets with your approval. Thank you for the opportunity to care for your patient.      Sincerely,      Henri Medal, MS, AGACNP-BC  [facility]  [address], [address]  [place], [place] [address]  T [phone] ext [phone] F [phone]     Case discussed with Dr. Zollie Pee who agrees with plan     Concur.  Plan R CEA. The indications, benefits and risks have been explained to the patient in the presence of the nurse/resident/family.  Alternative therapies including non-operative, conservative therapies were discussed with the patient.  Risks including but not limited to bleeding, infection, nerve damage, damage to adjacent structures, need for adjunctive procedures, lack of signs and symptoms resolution, inability to complete procedure, retention of foreign bodies, fire/skin burn, heart attack, multisystem organ failure, stroke and death were explained. No guarantees to surgical outcome were given or implied. The patient demonstrated understanding, and acceptance of the risks, and consented to proceed forth with the procedure.  All of  the patient's questions were answered to the patient's satisfaction.      Yaniel Limbaugh Lutricia Horsfall, MD, FACS, RPVI  Vascular [facility] & Endovascular Intervention  [facility] / [facility]  Staff Surgeon, [facility] & [facility] [place]  [address], [address], [place], [place] [address]  [address] [place], [place] [address]  Tel: [phone] / Fax: [phone]  [email or web address]

## 2021-02-22 NOTE — Anesthesia Postprocedure Evaluation (Signed)
Anesthesia Post Evaluation    [name]    Procedure(s):  RIGHT CAROTID ARTERY ENDARTERECTOMY WITH NEUROMONITORING    Anesthesia type: general    Last Vitals:   Vitals Value Taken Time   BP 152/65 02/22/21 1410   Temp 36.2 C (97.2 F) 02/22/21 1250   Pulse 68 02/22/21 1410   Resp 22 02/22/21 1410   SpO2 98 % 02/22/21 1410                 Anesthesia Post Evaluation:     Patient Evaluated: bedside  Patient Participation: complete - patient participated  Level of Consciousness: awake and alert  Pain Score: 3  Pain Management: adequate  Multimodal analgesia pain management approach    Airway Patency: patent    Anesthetic complications: No      PONV Status: none    Cardiovascular status: acceptable  Respiratory status: acceptable  Hydration status: acceptable        Signed by: Jenne Campus, MD, 02/22/2021 2:46 PM

## 2021-02-22 NOTE — Progress Notes (Signed)
Team 2: VASCULAR SURGERY POST OP   [phone]      Date Time: 02/22/21 4:05 PM  Patient Name: Tara Perry  Attending Vascular Physician: Benita Gutter, MD   Hospital Day: 1    POD # 0  Date of Surgery: 02/22/21   Procedure: Procedure(s) (LRB):  RIGHT CAROTID ARTERY ENDARTERECTOMY WITH NEUROMONITORING (Right)   Surgeon: Benita Gutter, MD     Subjective:      Afebrile, SBO 130s-150s   JP drain in place with min SS output  States had mild HA when first arrived to PACU, now resolved after pain medication  Has tried some sips with meds without any nausea or vomiting  Denies any CP, SOB, weakness, numbness, HA, vision changes, dizziness, dysphagia    Assessment:   RUBIE FICCO is a 75 y.o.  female  with a history of bilateral carotid artery stenosis, now with progression of the R ICA 80-99% who is POD 0 s/p R Carotid Endarterectomy.     Plan:     SBP goal 100-160   Advance diet   JP drain management; monitor and record output. Plan to remove tomorrow pending output.   Eliquis, ASA 81 mg POD1   Continue statin   Q4 NV checks   Bedrest overnight; plan to be OOB/ambulate in the morning.   Pain control as needed.     Labs:       Hematology   No results for input(s): WBC, HGB, HCT, PLT in the last 72 hours.     Coagulation   No results for input(s): PT, INR, PTT in the last 72 hours.    Chemistry   No results for input(s): NA, K, CL, CO2, BUN, CREATININE, CREAT, GLU, CA, MG, PHOS in the last 72 hours.    Invalid input(s): CR     Liver Function Tests   No results for input(s): AST, ALT, ALKPHOS, PROT, ALB, BILITOTAL, BILIDIRECT, LIP, AMY, PREALB in the last 72 hours.        Medications:     Current Facility-Administered Medications   Medication Dose Route Frequency    bupivacaine (PF)        heparin (porcine)        heparin (porcine)        lidocaine (PF)        sugammadex sodium        thrombin           fentaNYL (PF), HYDROmorphone, metoclopramide, ondansetron     Input/Output:     Patient Lines/Drains/Airways Status        Active Lines, Drains and Airways       Name Placement date Placement time Site Days    Peripheral IV 02/22/21 20 G Left Wrist 02/22/21  0759  Wrist  less than 1    Peripheral IV 02/22/21 18 G Standard Right Hand 02/22/21  1045  Hand  less than 1    Peripheral Arterial Line 02/22/21 Left Radial 02/22/21  1013  Radial  less than 1                      Physical Exam:   Current Vitals:   Vitals:    02/22/21 1600   BP: 136/60   Pulse: 65   Resp: 12   Temp:    SpO2: 96%       Gen: NAD, resting comfortably in PACU  Neuro: A+O x 3, speech clear and congruent, mild facial droop s/p  block (improving),  tongue midline, EOMI, PERRL   Cardio: RRR   Pulm: unlabored breathing, on RA  Abd: soft, ND, NTTP   Ext:   Warm, motor and sensory intact all throughout  R neck incision covered with dressing and tegaderm, no strike through, no surrounding erythema, no swelling, soft, no e/o hematoma  JP in place with min SS output    Pulse:  Palpable DP/PT bilaterally

## 2021-02-22 NOTE — Plan of Care (Signed)
Admission Assessment: CTUS     Admitted from: PACU    Orientation: A&Ox4  VSS/other: RA  Rhythm on tele: V paced, SR, HR 70s  Ambulation: SB assist   Lines/Drips: 18G RH, 20G L wrist  GI/GU: Contx2, LBM: 12/12  Code Status: Full  Fall Score: High from sedation procedure      Verified patient ID/arm bands. Ensured appropriate safety precautions in place including: assigned fall score interventions, review of known allergies, special needs, personal items within reach, and call bell within reach.      Critical Labs/Images/Procedures:  - 12/13 R CEA       Comments:  - DTV by 0130. Voided @ 2100  - JP drain on R neck site, CDI  *See doc flow for complete assessment and vitals*     Plan:   - Vitals Q4H  - Neuro Q4H  - NVC Q4H   - Pain management  - SBP goal 100-160  - HOB 45 overnight, OOB for bathroom as tolerated   - Monitor JP drain & R neck site      Skin Assessment  Cosign Note: Bernice      Skin WNL EXCEPT for:  LUE: PIV  RUE: PIV  TORSO: L upper chest ICD & ICD scar  BUTTOCKS/INGUINAL/PERINEAL: blanchable redness on buttocks  OTHER: scattered abrasions & bruises

## 2021-02-22 NOTE — Op Note (Signed)
VASCULAR SURGERY OPERATIVE NOTE  [facility]      DATE OF SURGERY:  02/22/21    PREOPERATIVE DIAGNOSIS:  Asymptomatic Right Carotid stenosis    POSTOPERATIVE DIAGNOSIS: Same    SURGERY:  Right Carotid endarterectomy and bovine pericardial patch angioplasty with neuromonitoring    SURGEON:  Maseer Lutricia Horsfall, MD, FACS, RPVI    ASSISTANT SURGEON:  Shyteria Lewis E. Eloise Harman, MD (Fellow)    ANESTHESIA:  GETA    SPECIMENS:  Right Carotid bifurcation plaque    DRAINS: 10 [place] round JP    FINDINGS: focal right carotid bifurcation plaque    ESTIMATE BLOOD LOSS:  50 mL    URINE OUTPUT: No Foley    COMPLICATIONS: None    CONDITION: Stable        INDICATIONS FOR PROCEDURE: The patient is a 75 y.o.female with a right carotid stenosis of 80-99%.  This was confirmed by duplex scan imaging and MRA.  The patient needed to undergo carotid endarterectomy for primary stroke prevention.  The indications, benefits and risks have been explained to the patient in the presence of the nurse/resident/family.  Alternative therapies including non-operative, conservative therapies were discussed with the patient.  Risks including but not limited to bleeding, infection, nerve damage, damage to adjacent structures, need for adjunctive procedures, lack of signs and symptoms resolution, inability to complete procedure, retention of foreign bodies, fire/skin burn, heart attack, multisystem organ failure, stroke and death were explained. No guarantees to surgical outcome were given or implied. The patient demonstrated understanding, and acceptance of the risks, and consented to proceed forth with the procedure.  All of the patient's questions were answered to the patient's satisfaction.      DESCRIPTION OF PROCEDURE: After informed consent was obtained, the patient was taken to the operating room placed in supine position.  General anesthesia via the tracheal tube was established.  Intravenous antibiotics were administered.  A radial arterial line was  placed by anesthesia.  A transverse towel was placed behind the patient's shoulders a foam donut behind the head.  It was turned 45 degrees to the left of the midline.  An intraoperative duplex scan was performed to identify and mark the patient's carotid bifurcation on the skin surface.  The right neck was prepped and draped in the standard sterile fashion.  The appropriate timeout was instituted.  An incision was made with a #10 blade through the skin and dermis over the course of approximately 5 cm centered over the carotid bifurcation and extended equally cephalad and caudal, anterior to the sternocleidomastoid muscle over the carotid pulse.  Subcutaneous dissection was conducted Conservator, museum/gallery.  The distal muscle was divided.  The common facial vein was doubly ligated with 2-0 silk ties and transected.  The carotid sheath was sharply incised and the anterior border the jugular vein was dissected.  The common carotid artery, superior thyroid artery, external carotid artery, and distal normalized internal carotid artery were dissected and circumferentially vessel loop controlled.  Care was taken to preserve the jugular vein, vagus nerve, and hypoglossal nerve.  The patient was heparinized 5000 units via intravenous bolus.  After circulation time of 3 minutes.  Vesseloops were used to cinch down the internal carotid, external carotid, superior thyroid, and common carotid arteries in that order.  Continuous EEG and SSEP monitoring was performed throughout the case and no abnomalities were noted.  An 11 blade longitudinal arteriotomy was created on the anterior surface of the common carotid artery and extended with the Potts scissors  across the bulky calcific plaque at the carotid bifurcation to the distal normalize internal carotid artery.  Freer elevator endarterectomy was performed the common carotid artery, eversion endarterectomy the external carotid and superior thyroid arteries, and the distal internal  carotid artery was feathered nicely.  No intimal flaps were noted.  Mills forceps were used to remove any loose medial circular fibers.  The lumen is irrigated with heparinized normal saline flush and touched up with a peanut dissector.  A 0.8 x 8 cm LeMaitre bovine pericardial patch angioplasty was performed in the standard running anastomotic fashion under 2.5 excellent magnification utilizing 6-0 Prolene suture.  Before the final stitches were placed de-airing maneuvers were performed with sequential unclamping and re-clamping of the common carotid, superior thyroid, external carotid, and internal carotid arteries, in that order.  The carotid lumen was irrigated with heparinized normal saline flush through the incompetent suture line.  Final stitches were placed and the suture line was completed.  Next, outflow external carotid and superior thyroid Vesseloops were released, common carotid inflow Vesseloops were released, and after 30 seconds outflow internal carotid artery Vesseloops were released and brain perfusion was restored.  Continuous-wave Doppler insonation indicated low resistance flow in the internal carotid artery with no step ups in frequency shifts noted, and continuous forward flow in diastole present.  Gelfoam thrombin is applied with anastomotic wound edges and then mobilized.  The suture line and wound were hemostatic.  An 11 blade incision was created in the right neck lateral and inferior to our primary incision and a 10 round fluted JP drain is placed into the wound and secured to the skin with a 3-0 nylon stitch.  Running 3-0 Vicryl sutures used to reapproximate the platysma muscle.  Running 4-0 Monocryl suture in a subcuticular fashion was used to reapproximate the skin.  Mastisol, half-inch Steri-Strips, 4 x 4's, and Tegaderms were applied to the wound.  The sponge, needle, instrument count were reported to be correct.  The patient emerged from anesthesia with no neurologic deficits, and  was transported back to the recovery room in a stable hemodynamic and neurologic state, with no immediate complications noted.  Please note that Dr. Zollie Pee, as primary operative surgeon, was present for the case in its entirety, including its critical portions.

## 2021-02-22 NOTE — Plan of Care (Signed)
4 eyes in 4 hours pressure injury assessment note:      Completed with: Bernice  Unit & Time admitted: PACU @ 2000             Bony Prominences: Check appropriate box; if wound is present enter wound assessment in LDA     Occiput:                 [x] WNL  []  Wound present  Face:                     [x] WNL  []  Wound present  Ears:                      [x] WNL  []  Wound present  Spine:                    [x] WNL  []  Wound present  Shoulders:             [x] WNL  []  Wound present  Elbows:                  [x] WNL  []  Wound present  Sacrum/coccyx:     [x] WNL  []  Wound present  Ischial Tuberosity:  [x] WNL  []  Wound present  Trochanter/Hip:      [x] WNL  []  Wound present  Knees:                   [x] WNL  []  Wound present  Ankles:                   [x] WNL  []  Wound present  Heels:                    [x] WNL  []  Wound present  Other pressure areas:  []  Wound location       Device related: []  Device name:         LDA completed if wound present: yes/no  Consult WOCN if necessary    Other skin related issues, ie tears, rash, etc, document in Integumentary flowsheet

## 2021-02-22 NOTE — Progress Notes (Signed)
Upon transfer to unit by covering RN, patient reported bladder fullness. Attempted to void with external female catheter. Unable, per report. Bladder scan for 725cc per report @ 1835. I called MD, resident Rito Ehrlich, upon receipt of handover report. MD states via phone, place foley cath; if less than 600cc output, perform as straight cath; if > 600cc leave as indwelling foley catheter. I, with RN Francesca Jewett at bedside, attempted cath 1x. Edema noted, patient reported tenderness, not successful. Return call to MD. MD request additional attempt to void. Patient voided on bedpad @ 1900, while calling daughter; 1x occurrence. Bladder scan for 458cc. Notified Dr. Manson Passey at bedside. Patient oob to bathroom, void 400cc yellow urine. Return call to MD to update. Ok to transfer to floor at this time per PACU parameters and per confirmation regarding GU status with MD. Transferred with transport and RN @ 2000. Neurovasc and dressing checks at bedside with receiving RN on CTUS.     02/22/21 1835 02/22/21 1900 02/22/21 1910   Output (mL)   Urine  --   --   --    Bladder Scan Volume (mL)   (725cc per verbal report on handoff; MD called)  --  458 mL  (MD Susy Manor at bedside)   Unmeasured Output   Urine Occurrence  --  1  (to pad, while on phone to update daughter on delay to room)  --    Stool Occurrence  --   --  0   Urine   Urinary Incontinence  --  No  --       02/22/21 1930   Output (mL)   Urine 400 mL   Bladder Scan Volume (mL)  --    Unmeasured Output   Urine Occurrence  --    Stool Occurrence  --    Urine   Urinary Incontinence  --

## 2021-02-22 NOTE — Anesthesia Preprocedure Evaluation (Signed)
Anesthesia Evaluation    AIRWAY    Mallampati: II    TM distance: >3 FB  Neck ROM: limited  Mouth Opening:full   CARDIOVASCULAR    regular and normal       DENTAL    no notable dental hx     PULMONARY    clear to auscultation     OTHER FINDINGS                  Relevant Problems   CARDIO   (+) Asymptomatic carotid artery stenosis without infarction, bilateral   (+) CHF (congestive heart failure)   (+) Mitral valve regurgitation      ENDO   (+) Type 2 diabetes mellitus with diabetic chronic kidney disease      OTHER   (+) Gout synovitis     Confirms R CEA (BL carotid stenosis)  Hx TIA  Hx VF arrest s/p BiV ICD  Diastolic CHF (NICM)  DM2  CKD  LUE DVT  Hx duodenal ulcer -- pt denies melena    Reviewed last cardiology visit (pt reports visit with [facility] over past two months and was told tx stable)                Anesthesia Plan    ASA 4     general                                 informed consent obtained                   Signed by: Jenne Campus, MD 02/22/21 9:23 AM

## 2021-02-23 LAB — BASIC METABOLIC PANEL
Anion Gap: 11 (ref 5.0–15.0)
BUN: 24 mg/dL — ABNORMAL HIGH (ref 7.0–21.0)
CO2: 21 mEq/L (ref 17–29)
Calcium: 8.9 mg/dL (ref 7.9–10.2)
Chloride: 106 mEq/L (ref 99–111)
Creatinine: 1.2 mg/dL — ABNORMAL HIGH (ref 0.4–1.0)
Glucose: 127 mg/dL — ABNORMAL HIGH (ref 70–100)
Potassium: 4.3 mEq/L (ref 3.5–5.3)
Sodium: 138 mEq/L (ref 135–145)

## 2021-02-23 LAB — GLUCOSE WHOLE BLOOD - POCT
Whole Blood Glucose POCT: 120 mg/dL — ABNORMAL HIGH (ref 70–100)
Whole Blood Glucose POCT: 292 mg/dL — ABNORMAL HIGH (ref 70–100)

## 2021-02-23 LAB — CBC
Absolute NRBC: 0 10*3/uL (ref 0.00–0.00)
Hematocrit: 34.8 % (ref 34.7–43.7)
Hgb: 11.5 g/dL (ref 11.4–14.8)
MCH: 30.8 pg (ref 25.1–33.5)
MCHC: 33 g/dL (ref 31.5–35.8)
MCV: 93.3 fL (ref 78.0–96.0)
MPV: 10.2 fL (ref 8.9–12.5)
Nucleated RBC: 0 /100 WBC (ref 0.0–0.0)
Platelets: 227 10*3/uL (ref 142–346)
RBC: 3.73 10*6/uL — ABNORMAL LOW (ref 3.90–5.10)
RDW: 12 % (ref 11–15)
WBC: 10.66 10*3/uL — ABNORMAL HIGH (ref 3.10–9.50)

## 2021-02-23 LAB — MAGNESIUM: Magnesium: 2 mg/dL (ref 1.6–2.6)

## 2021-02-23 LAB — PHOSPHORUS: Phosphorus: 3.4 mg/dL (ref 2.3–4.7)

## 2021-02-23 LAB — GFR: EGFR: 43.6

## 2021-02-23 MED ORDER — OXYCODONE HCL 5 MG PO TABS
5.0000 mg | ORAL_TABLET | ORAL | 0 refills | Status: AC | PRN
Start: 2021-02-23 — End: 2021-03-02

## 2021-02-23 MED ORDER — ACETAMINOPHEN 325 MG PO TABS
650.0000 mg | ORAL_TABLET | ORAL | Status: DC | PRN
Start: 2021-02-23 — End: 2021-03-21

## 2021-02-23 MED ORDER — APIXABAN 2.5 MG PO TABS
2.5000 mg | ORAL_TABLET | Freq: Two times a day (BID) | ORAL | Status: DC
Start: 2021-02-23 — End: 2021-02-23
  Administered 2021-02-23: 2.5 mg via ORAL
  Filled 2021-02-23: qty 1

## 2021-02-23 NOTE — Discharge Instr - AVS First Page (Addendum)
Reason for your Hospital Admission:  Carotid stenosis       Instructions for after your discharge:  Vascular Surgery  CAROTID ENDARTERECTOMY   POST-OPERATIVE INSTRUCTIONS  [facility]  [address]  [facility]  [place], [place] [address]  Office: [phone]    Activity:  No exercise, sex, lifting anything greater than 20 pounds, vigorous activity or driving, for 2 weeks after surgery.   Some fatigue is expected for the first several weeks to a month after your procedure. Fatigue is the most common complaint after surgery.   Gradually increase your level of activity back to normal depending on how you feel.   You are encouraged to walk as much as you feel up to it, walking is a great way to increase your endurance. You can expect to walk 2-3 times a day after surgery. Remember to allow for rest periods.     Bathing/Showering:  You may shower 48 hours after your surgery. Do not scrub the incision site, but simply allow soapy water to run over it, rinse, and pat dry with a CLEAN towel. Do not allow the incision site to stay wet.    Do not submerse yourself in water (tubs/pools/hot tubs) until incisions are fully healed.  Shaving is permitted when the dressing is removed. Care should be taken in any areas under the chin that are numb to avoid injury    Care of the Surgical Incision:   You may experience some bruising along the lower neck and chest region after your surgery; this will improve in 1-2 weeks  If you have a bandage, you may remove it 2 days after surgery  Check your incisional site/sites daily for signs of infection  Staples, sutures and/or glue was used to closed your incisions, these will be removed at the time of your post-operative visit  There may be steri-strips (Band-Aid type strips) along your incision to help support the incisional site for a few days. These usually begin to peel away after 5-7 days  Avoid creams, powders, or lotions on your incision  immediately after surgery unless otherwise directed by your surgeon     Diet:  There are no special restrictions on your diet post-operatively  A "Heart Healthy" diet is recommended for all vascular patients  Poor appetite is expected for several weeks and small, frequent meals may be preferred    Smoking:  AVOID SMOKING  Smoking in any form should be avoided because it constricts blood vessels and increases the risk of blood clotting    Medications:  Take all your medications as prescribed UNLESS instructed otherwise at discharge.  For mild pain, you make take regular or extra strength Tylenol every 4-6 hours as needed. Do not exceed 3000 mg per day. Do not take with pain medications that have Tylenol as an ingredient.   You will be given a new prescription for pain medication as well, which should be taken every 4-6 hours for severe pain only if necessary   The discomfort around the incisions is the greatest during the first 2-3 days and then decreases daily  Nausea and constipation can occur as a result of taking pain medications and anesthesia. Taking pain medications with a meal or snack may help present nausea, while drinking plenty of liquids and eating high fiber foods (fruits, vegetables and grains) can help prevent constipation. Metamucil or Milk of Magnesia may also be used to treat constipation.   Keep your blood pressure well   controlled.  See your primary care provider and/or cardiologist if your blood pressure remains uncontrolled.     It is NORMAL after surgery to have:  Numbness of the neck, chin, and jaw on the side where surgery was done.  Mild lip droop on the surgery side.  Bruising of the neck and chest that changes color/location while healing.  Mild headache.  Mild problems swallowing or chewing on the side of surgery or hoarse voice.  You may find it easier to eat thicker foods and liquids immediately after surgery if you are having swallowing difficulty.  If you have questions about your  symptoms after surgery, call our office to discuss with a nurse.    Call [phone] if     Increased pain, swelling, or redness at the incision site.  Symptoms of infection such as drainage, an odor, warmth, or redness at the incision site, or a fever of over 101 degrees.  Your neck begins to swell or there is a pulsating mass under the incision.   Drainage from the incision    Call 911 and go to the nearest emergency room if you notice:   Significant bleeding from the incision (saturating multiple bandages, blood pulsating, or blood flow that does not stop when pressure is applied).  Sudden or severe chest pain or shortness of breath.  Any stroke-like symptoms including new or sudden: difficulty speaking, confusion, slurred speech, NEW facial droop, or weakness of one arm or leg.  Sudden changes in vision or loss of vision in JUST ONE EYE, even if it is temporary.    Follow up:  Carotid Endarterectomy: The surgery scheduler will set up a post-operative appointment in our office for 2 weeks after surgery for an incisional site check, if your surgeon is Dr. Babrowicz, Dr. Neville, Dr. Quan, Dr. Sam, Dr. Comerota and Dr. Endicott. The surgery scheduler will set up a post-operative appointment in our office for 3-4 weeks after surgery for an incisional site check with an ultrasound, if your surgeon is Dr. Hashemi, Dr. Mukherjee and Dr. Bade.       For any questions, call the [facility] office at [phone]

## 2021-02-23 NOTE — Progress Notes (Signed)
Shift Note:      Orientation: AOx 4  Rhythm on tele: V Paced, 1st Degree AV Block  Oxygen: RA   Ambulation: SBA    Pain: mild R neck pain - relieved w/ PRN Tylenol   Lines/Drips:  18G RH, 20G L Wrist  GI/GU: Cont x2, LBM: 12/12  Fall Score: HIGH d/t recent procedure     Critical Labs/Imaging/Procedures:   - 12/13 R CEA    Comments:   - JP Drain removed around 9 am. Pt tolerated well. R neck site covered w/ guaze and tegaderm, C/D/I.    - see doc flow for complete assessment and vitals.       Plan:   - Vitals Q4H   - Neuro Q4H  - NVC Q4H   - Pain Management   - SBP goal 100-160  - Monitor R neck site       Braden Score: 21

## 2021-02-23 NOTE — Progress Notes (Signed)
02/23/21 1200   Discharge Disposition   Patient preference/choice provided? N/A   Physical Discharge Disposition Home   Mode of Transportation Car   Patient/Family/POA notified of transfer plan Patient informed only;Yes   Patient agreeable to discharge plan/expected d/c date? Yes   Family/POA agreeable to discharge plan/expected d/c date? Yes   Bedside nurse notified of transport plan? Yes   CM Interventions   Follow up appointment scheduled? Yes   Is this a Medicare focused or COVID patient? Yes   Is this appointment within 48-72 hours? No   Referral made for home health RN visit? Does not meet home bound criteria   Multidisciplinary rounds/family meeting before d/c? Yes   Medicare Checklist   Is this a Medicare patient? Yes   Patient received 1st IMM Letter? Yes   3 midnight inpatient qualifying stay (SNF only) No   If LOS 3 days or greater, did patient received 2nd IMM Letter? n/a   Patient will discharge home with her daughter. There are no post acute care needs identified by CM.    Lenor Derrick, Adelfa Koh, Case manager  [facility]

## 2021-02-23 NOTE — Progress Notes (Signed)
Initial Case Management Assessment and Discharge Planning  [facility]   Patient Name: Tara Perry, Tara Perry   Date of Birth 1945-06-28   Attending Physician: Benita Gutter, MD   Primary Care Physician: Tammi Sou, MD   Length of Stay 1   Reason for Consult / Chief Complaint Planned R CEA        Situation   Admission DX:   1. Carotid stenosis, right        A/O Status: X 3    LACE Score: 6    Patient admitted from: direct  Admission Status: inpatient    Health Care Agent: Child  Name: Festus Aloe  Phone number: [phone]       Background     Advanced directive:   Unknown    Code Status:   Full Code     Residence: Multi-story home    PCP: Tammi Sou, MD  Patient Contact:   [phone] (home)     [phone] (mobile)     Emergency contact:   Extended Emergency Contact Information  Primary Emergency Contact: [name],[name]  Address: [address]           [place], [place] [address] [place]  Home Phone: [phone]  Mobile Phone: [phone]  Relation: Daughter  Secondary Emergency Contact: [facility]  Address: [address]           [place], [place] [address] [place]  Home Phone: [phone]  Mobile Phone: [phone]  Relation: Son      ADL/IADL's: Independent  Previous Level of function: 7 Independent     DME: None    Pharmacy:     [id number] - [place] - [address]  [address]  [facility] [address]  Phone: [phone] Fax: [phone]    [facility] - [place] - [address]  [address]  [place] [address]  Phone: [phone] Fax: [phone]      Prescription Coverage: Yes    Home Health: The patient is not currently receiving home health services.    Previous SNF/AR: N/A    COVID Vaccine Status: 3 vaccines    Date First IMM given: See chart  UAI on file?: No  Transport for discharge? Mode of transportation: Sales executive - Family/Friend to drive  patient  Agreeable to Home with family post-discharge:  Yes     Assessment     BARRIERS TO DISCHARGE: VC q 4 hours, remove JP drain.     Recommendation   D/C Plan A: Home with family    D/C Plan B: Home with home health      Interview with patient who has 1 step to enter her home and a flight of stairs to her bedroom. Her son Madelaine Bhat lives with her and her sister is visiting for Christmas. Lots of family support. The patient state she is working on an advance directive with her attorney. In the meantime any decision she is not able to  make are divided among her 3 children. Her daughter will bring her home from the hospital, no discharge needs identified. D/C for 02/23/2021.    Lenor Derrick, Adelfa Koh, Case manager  [facility]

## 2021-02-23 NOTE — UM Notes (Signed)
02/22/21 6295  Admit to Inpatient  Once        Diagnosis: Carotid Stenosis, Right    Level of Care: Step Down/ [facility], [facility], [facility] ONLY    Patient Class: Inpatient            UNIT: Cardiac Telemetry    A 75 y.o.presents today as a surgical admit for R CEA for  high grade R ICA stenosis (80-99%).   She has history of VF cardiac arrest in June 2020 with ICD placement in left upper chest and history of DVT in the left upper extremity after its  placement and has been on Eliquis therapy. Other past medical history includes diabetes mellitus, gout and cardiomyopathy. MRA performed on 12/14/2020 demonstrated extensive  atherosclerotic plaque right carotid bifurcation resulting in approximately 80-90% stenosis of the proximal right internal carotid artery. Moderate atherosclerotic plaque at the left carotid bifurcation resulting in approximately 60-70% stenosis.     VS--BP 128/71  P 69  T 97.7  R 17  SpO2 99% (RA)  BMI 24.2 kg/m    HEENT:  Normpcephalic.  EOMI.  Tenderness behind the left ear, no swelling noted on examination.   Neck:  No JVD.  Bilateral carotid bruits.     ABN LABS--GLU 127    VASCULAR SURGERY OPERATIVE NOTE  DATE OF SURGERY:  02/22/21  PREOPERATIVE DIAGNOSIS:  Asymptomatic Right Carotid stenosis  POSTOPERATIVE DIAGNOSIS: Same  SURGERY:  Right Carotid endarterectomy and bovine pericardial patch angioplasty with neuro monitoring  SURGEON:  Maseer Lutricia Horsfall, MD, FACS, RPVI  ASSISTANT SURGEON:  Jeanette E. Eloise Harman, MD (Fellow)  ANESTHESIA:  GETA  SPECIMENS:  Right Carotid bifurcation plaque  DRAINS: 10 [place] round JP  FINDINGS: focal right carotid bifurcation plaque  ESTIMATE BLOOD LOSS:  50 mL  URINE OUTPUT: No Foley  COMPLICATIONS: None  CONDITION: Stable     PLAN POST-OP:  SBP goal 100-160   Advance diet   JP drain management; monitor and record output. Plan to remove tomorrow pending output.   Eliquis, ASA 81 mg POD1   Continue statin   Q4 NV checks   Bedrest overnight; plan to be OOB/ambulate in  the morning.   Pain control as needed.     UTILIZATION REVIEW CONTACT: Name: Kathlene Cote RNC BSN  Clinical Case Manager - Utilization Review   [facility]  [address]  [place], [address] [place], [place] [address]  NPI: [id number]   Tax ID: [id number]   Phone: [phone]  Fax: [phone]     NOTES TO REVIEWER:    This clinical review is based on/compiled from documentation provided by the treatment team within the patient's medical record.

## 2021-02-23 NOTE — Discharge Summary -  Nursing (Signed)
Discharge Note:     Discharge location: Home        Discharge instructions, follow up, and medications reviewed and explained to patient with family at bedside - questions answered stated understanding.     Prescriptions: Sent to pharmacy    IV and tele removed. All belongings with patient. VSS.   Pt transported off of unit via WC at 2:00 PM.

## 2021-02-23 NOTE — Final Progress Note (DC Note for stay less than 48 (Signed)
Team 2: FINAL PROGRESS NOTE  Vascular Surgery   [id number]      Patient Name: Tara Perry  Attending Vascular Physician: Benita Gutter, MD   Team Contact Info:  Spectra: [phone]    Procedures:   02/22/2021: R CEA     Interval events:     Voided overnight after retaining in PACU (straight cath x 1)   JP drain 20 cc  Tolerating water without any issues   Denies any HA, SOB, CP, dysphagia, dizziness, weakness, or numbness  Labs: N/A  VS: SBP 100-168, Afebrile  Assessment:     TRUST CRAGO is a 75 y.o.  female admitted on 02/22/2021 s/p  R CEA for high grade R ICA stenosis (80-99%). 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 *      Plan:   No further vascular surgery intervention this admission; continue follow up for the L ICA stenosis in outpatient setting.   Resume ASA 81 mg, Eliquis today. Continue statin   OOB; encourage ambulation   Cardiac diet   Remove JP drain this morning.   Q4 NV checks   Continue home antihypertensives   Plan for discharge today pending above; follow up with Dr. Zollie Pee in the outpatient vascular surgery office as scheduled (date in AVS)     Review of Symptoms:     A complete review of systems was performed and was negative except for the pertinent positives documented in the HPI.     Labs:     Recent Labs   Lab 02/16/21  1130   WBC 7.79   RBC 4.05   Hgb 12.6   Hematocrit 38.1   Glucose 223*   BUN 30.0*   Creatinine 1.6*   Calcium 9.8   Sodium 140   Potassium 5.0   Chloride 107   CO2 21       Vitals:    02/23/21 0331   BP: 115/69   Pulse: 83   Resp: 18   Temp: 97.7 F (36.5 C)   SpO2: 95%       PHYSICAL EXAM:   Gen: NAD, resting in bed comfortably  Neuro: CNII-XII grossly intact.   Cardio: RRR.     Pulm: unlabored breathing, on RA  Abd: soft, ND, NTTP  Ext:  Warm, motor and sensory intact all throughout  Incisions/Wounds: R neck incision covered with dressing and tegaderm, no strike through, no surrounding erythema, no swelling, soft, no e/o hematoma  JP in place with min SS output    Pulse:   Palpable DP/PT bilaterally         Medication List        ASK your doctor about these medications      apixaban 2.5 MG  Commonly known as: Eliquis  Take 1 tablet (2.5 mg) by mouth every 12 (twelve) hours     aspirin EC 81 MG EC tablet     atorvastatin 40 MG tablet  Commonly known as: LIPITOR  Take 2 tablets (80 mg total) by mouth nightly     carvedilol 12.5 MG tablet  Commonly known as: Coreg  Take 1 tablet (12.5 mg) by mouth 2 (two) times daily with meals     CENTRUM PO     Entresto 49-51 MG  Tabs per tablet  Generic drug: sacubitril-valsartan  Take 1 tablet by mouth 2 (two) times daily     glimepiride 2 MG tablet  Commonly known as: AMARYL     Jardiance 25 MG tablet  Generic drug: empagliflozin     metFORMIN 500 MG tablet  Commonly known as: GLUCOPHAGE  Take 1 tablet (500 mg total) by mouth 2 (two) times daily Morning and night     vitamin B-12 100 MCG tablet  Commonly known as: CYANOCOBALAMIN

## 2021-02-23 NOTE — Discharge Instructions (Signed)
When every minute counts, make sure you can recognize the above signs of stroke.

## 2021-02-24 ENCOUNTER — Encounter: Payer: Self-pay | Admitting: Specialist

## 2021-02-24 LAB — LAB USE ONLY - HISTORICAL SURGICAL PATHOLOGY

## 2021-03-04 ENCOUNTER — Encounter (INDEPENDENT_AMBULATORY_CARE_PROVIDER_SITE_OTHER): Payer: Self-pay

## 2021-03-04 NOTE — Progress Notes (Signed)
Appt Date/Time: 03/21/20 @  11:45 am     75 y.o. female   PCP: Tammi Sou, MD  Referring Physician:         Appointment Type: Post-Op  Imaging: [place] Today Carotid US    Chief Complaint/HPI: Status post Right carotid endarterectomy 02/22/21    Date of Last Visit:  12/29/19    Allergies:   Allergies   Allergen Reactions    Shellfish-Derived Products Anaphylaxis         Selected Medications:  Aspirin, Atorvastatin, Eliquis, and Metformin    Focused Prior Medical History: Diabetes and Kidney Disease             Smoker           Former Smoker    x      Never Smoker      Vital Signs this Visit Pulses  HT  BP R BP L Temp   Rad Ulnar Brach Fem Pop DP PT        R          WT  Pulse R Pulse L SpO2       L          Imaging results:        Plan of Care:

## 2021-03-06 DIAGNOSIS — Z95810 Presence of automatic (implantable) cardiac defibrillator: Secondary | ICD-10-CM

## 2021-03-08 ENCOUNTER — Other Ambulatory Visit (INDEPENDENT_AMBULATORY_CARE_PROVIDER_SITE_OTHER): Payer: Medicare Other | Admitting: Cardiovascular Disease

## 2021-03-08 LAB — REMOTE CARDIAC DEVICE MONITORING
AF Burden Percentage: 0
ICD Shock Recent Count: 0
RV Pacing Percentage: 1.1

## 2021-03-10 NOTE — Progress Notes (Unsigned)
[facility]    Chief Complaint   Patient presents with    Post-op     Status post Right carotid endarterectomy 02/22/21     History of Present Illness   Tara Perry is a 75 y.o. female with a history of asymptomatic carotid stenosis of 80-99% stenosis of the R ICA and 60-70% stenosis of the L ICA. She presents s/p R CEA 02/22/2021 by Dr. Zollie Pee. She presents with her son, Minerva Areola.    She reports she is doing well-post-operatively; she had no issues with pain or concerns for infection in the post-operative period. She is driving again and is getting back to her normal level of activity. Patient denies any recent symptoms of lateralizing neurologic ischemia including stroke, TIA, monocular visual changes, or amaurosis fugax.     She denies any other vascular concerns; she denies claudication, rest pain, or tissue loss.    She was followed closely with carotid duplex. At her office visit in October 2021, duplex demonstrated increased peak systolic velocity in the right internal carotid artery to 254 cm/s.  This is increased from 236 cm/s in the duplex performed in 05/07/2019. Additionally the ICA to CCA ratio has increased to 2.9, from 2.5 in 04/2019.  She has known CKD stage III with recent GFR of 31 and therefore CTA was not recommended.  MRA was ordered but completion was delayed due to her severe claustrophobia. MRA performed on 12/14/2020 demonstrated extensive atherosclerotic plaque right carotid bifurcation resulting in approximately 80-90% stenosis of the proximal right internal carotid artery. Moderate atherosclerotic plaque at the left carotid bifurcation resulting in approximately 60-70% stenosis.     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  Other past medical history includes diabetes mellitus, gout and cardiomyopathy. She has history of DVT in the left upper extremity after its placement and has been on Eliquis therapy, managed by  hematology.    She is compliant with Aspirin, and Atorvastatin.    Past Medical History     Past Medical History:   Diagnosis Date    Anemia 08/2018    Atrophic vaginitis 08/30/2018    Bilateral cataracts     I have implants    Cardiac arrest 08/17/2018    VF arrest. She received 3 shocks, 1 round of epi with successful ROSC. 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/2018    per patient, he only sees peripheral view    Diabetes mellitus 1992    Encounter for blood transfusion     unsure of dates    Gout spondylitis     Gout synovitis     Hypertension     LBBB (left bundle branch block)     LBBB chronic    NICM (nonischemic cardiomyopathy) 08/2018    NICM, EF 20%    Renal insufficiency     Respiratory failure 08/2018    Hypoxic/anoxic respiratory failure, extubated 08/20/2018    Type 2 diabetes mellitus, controlled     Viral cardiomyopathy 2008     Past Surgical History:   Procedure Laterality Date    COLONOSCOPY  2002    normal    EGD N/A 09/01/2018    Procedure: EGD;  Surgeon: Lilia Pro, MD;  Location: [facility];  Service: Gastroenterology;  Laterality: N/A;  ENDARTERECTOMY, CAROTID ARTERY Right 02/22/2021    Procedure: RIGHT CAROTID ARTERY ENDARTERECTOMY WITH NEUROMONITORING;  Surgeon: Benita Gutter, MD;  Location: [facility];  Service: Cardiovascular;  Laterality: Right;    EXPLORATORY LAPAROTOMY      EYE SURGERY      ICD IMPLANT BIV N/A 08/26/2018    Procedure: ICD IMPLANT BIV;  Surgeon: Len Childs, MD;  Location: [facility];  Service: Cardiovascular;  Laterality: N/A;  medtronic    INSERT / REPLACE / REMOVE PACEMAKER      Difibrillator August 26, 2018    LHC W/ CORONARY ANGIOS AND LV Left 08/21/2018    Procedure: LHC W/ CORONARY ANGIOS AND LV;  Surgeon: Burna Forts, MD;  Location: [facility];  Service: Cardiovascular;  Laterality: Left;    SMALL INTESTINE SURGERY   01/2013     Allergies     Allergies   Allergen Reactions    Shellfish-Derived Products Anaphylaxis     Medications       Current Outpatient Medications:     apixaban (Eliquis) 2.5 MG, Take 1 tablet (2.5 mg) by mouth every 12 (twelve) hours, Disp: 180 tablet, Rfl: 3    aspirin EC 81 MG EC tablet, Take 81 mg by mouth daily, Disp: , Rfl:     atorvastatin (LIPITOR) 40 MG tablet, Take 2 tablets (80 mg) by mouth nightly, Disp: 180 tablet, Rfl: 1    carvedilol (Coreg) 12.5 MG tablet, Take 1 tablet (12.5 mg) by mouth 2 (two) times daily with meals, Disp: 180 tablet, Rfl: 3    glimepiride (AMARYL) 2 MG tablet, Take 2 mg by mouth daily, Disp: , Rfl:     Jardiance 25 MG tablet, 25 mg every morning  , Disp: , Rfl:     metFORMIN (GLUCOPHAGE) 500 MG tablet, Take 1 tablet (500 mg total) by mouth 2 (two) times daily Morning and night (Patient taking differently: Take 500 mg by mouth 2 (two) times daily with meals), Disp: , Rfl:     Multiple Vitamins-Minerals (CENTRUM PO), Take by mouth daily, Disp: , Rfl:     sacubitril-valsartan (Entresto) 49-51 MG Tab per tablet, Take 1 tablet by mouth 2 (two) times daily, Disp: 180 tablet, Rfl: 3    vitamin B-12 (CYANOCOBALAMIN) 100 MCG tablet, Take 1,000 mcg by mouth daily, Disp: , Rfl:     Family History   Problem Relation Age of Onset    Cancer Father 4        bladder    Diabetes Father     Heart attack Father      Social History     Socioeconomic History    Marital status: Widowed   Tobacco Use    Smoking status: Never    Smokeless tobacco: Never   Vaping Use    Vaping Use: Never used   Substance and Sexual Activity    Alcohol use: Not Currently     Alcohol/week: 1.0 standard drink     Types: 1 Glasses of wine per week     Comment: rarely    Drug use: No    Sexual activity: Not Currently     Physical Exam     Vitals:    03/21/21 1145 03/21/21 1146   BP: 118/63 114/66   BP Site: Right arm Left arm   Patient Position: Sitting Sitting   Cuff Size: Medium Medium   Pulse: 84 84   Temp: 97.9 F  (36.6 C)    TempSrc: Temporal  SpO2: 96%    Weight: 60.5 kg (133 lb 6.4 oz)      Body mass index is 22.9 kg/m.    Physical Exam  Vitals reviewed.   Constitutional:       General: She is not in acute distress.     Appearance: Normal appearance.   HENT:      Head: Normocephalic and atraumatic.   Cardiovascular:      Rate and Rhythm: Normal rate and regular rhythm.      Pulses:           Carotid pulses are 2+ on the right side and 2+ on the left side.       Radial pulses are 2+ on the right side and 2+ on the left side.        Dorsalis pedis pulses are 2+ on the right side and 2+ on the left side.        Posterior tibial pulses are 2+ on the right side and 2+ on the left side.      Heart sounds: Normal heart sounds.   Pulmonary:      Effort: Pulmonary effort is normal.      Breath sounds: Normal breath sounds.   Skin:     General: Skin is warm and dry.      Comments: L neck incision 95% healed; no evidence of infection or dehiscence. 2 steri-strips present to proximal aspect of incision.   Neurological:      Mental Status: She is alert and oriented to person, place, and time.      Comments: Gross motor and sensory intact.   Psychiatric:         Attention and Perception: Attention normal.         Mood and Affect: Mood and affect normal.         Speech: Speech normal.         Behavior: Behavior normal. Behavior is cooperative.         Thought Content: Thought content normal.      Imaging   03/21/2021 Carotid Duplex performed in our vascular lab:  1. The extracranial carotid arteries, proximal vertebral and proximal subclavian arteries were evaluated with high resolution imaging, color Doppler, and spectral Doppler techniques. Findings are as follows:   2. Widely patent right carotid endarterectomy.   3. There is some intimal thickening throughout the left carotid system. The peak systolic velocities and the small amount of heterogeneous plaque formation at the left carotid bulb, which extends to the origin of the  internal carotid artery. This appears to be creating a 50-69% stenosis.   4. The external carotid arteries are widely patent.   5. Antegrade flow was in the vertebral arteries bilaterally.   6. The subclavian arteries are widely patent.     Results reviewed with patient in the office.  Assessment and Plan   Tara Perry is a 75 y.o. female with a history of asymptomatic carotid stenosis of 80-99% stenosis of the R ICA and 50-69% stenosis of the L ICA. She presents s/p R CEA 02/22/2021 by Dr. Zollie Pee. She presents with her son, Minerva Areola.    - She has recovered well from surgery, and remains asymptomatic. Carotid duplex performed in our vascular lab today demonstrates widely patent R ICA and stable L ICA stenosis of 50-69%    - Remainder of steri-strips removed from incision without issue; patient advised she has no restrictions from a surgical standpoint and may resume all of her  normal activities.    - Continue Aspirin, and statin for medical management of arterial disease. Call 911 and proceed directly to the closest emergency department for any s/s of lateralizing neurologic ischemia, stroke, TIA, or amaurosis fugax.     - Follow-up in 22-months with carotid duplex in our vascular lab.       We appreciate the opportunity to be involved in your patient's care. Please do not hesitate to contact the office with any questions or concerns.     Sincerely,     Maseer Lutricia Horsfall, MD, FACS, RPVI  Vascular Surgery & Endovascular Intervention  [facility]  Staff Surgeon, [facility] [place]  [address], [address], [place], [place] [address]  [address], [place], [place] [address]  Tel: [phone] / Fax: [phone]  [email or web address]  Vascular Nurse Practitioner  [facility]  [address]  [place], [place] [address]  T  [phone] F [phone]

## 2021-03-17 ENCOUNTER — Other Ambulatory Visit (INDEPENDENT_AMBULATORY_CARE_PROVIDER_SITE_OTHER): Payer: Self-pay

## 2021-03-17 DIAGNOSIS — I6521 Occlusion and stenosis of right carotid artery: Secondary | ICD-10-CM

## 2021-03-18 ENCOUNTER — Other Ambulatory Visit (INDEPENDENT_AMBULATORY_CARE_PROVIDER_SITE_OTHER): Payer: Self-pay

## 2021-03-18 ENCOUNTER — Other Ambulatory Visit (INDEPENDENT_AMBULATORY_CARE_PROVIDER_SITE_OTHER): Payer: Self-pay | Admitting: Cardiovascular Disease

## 2021-03-18 DIAGNOSIS — E7800 Pure hypercholesterolemia, unspecified: Secondary | ICD-10-CM

## 2021-03-18 MED ORDER — ATORVASTATIN CALCIUM 40 MG PO TABS
80.0000 mg | ORAL_TABLET | Freq: Every evening | ORAL | 1 refills | Status: DC
Start: 2021-03-18 — End: 2021-08-05

## 2021-03-21 ENCOUNTER — Encounter (INDEPENDENT_AMBULATORY_CARE_PROVIDER_SITE_OTHER): Payer: Self-pay | Admitting: Specialist

## 2021-03-21 ENCOUNTER — Ambulatory Visit (INDEPENDENT_AMBULATORY_CARE_PROVIDER_SITE_OTHER): Payer: Medicare Other | Admitting: Specialist

## 2021-03-21 ENCOUNTER — Ambulatory Visit (INDEPENDENT_AMBULATORY_CARE_PROVIDER_SITE_OTHER): Payer: Medicare Other

## 2021-03-21 VITALS — BP 114/66 | HR 84 | Temp 97.9°F | Wt 133.4 lb

## 2021-03-21 DIAGNOSIS — I6523 Occlusion and stenosis of bilateral carotid arteries: Secondary | ICD-10-CM

## 2021-03-21 DIAGNOSIS — Z79899 Other long term (current) drug therapy: Secondary | ICD-10-CM

## 2021-03-21 DIAGNOSIS — I6521 Occlusion and stenosis of right carotid artery: Secondary | ICD-10-CM

## 2021-03-21 DIAGNOSIS — Z7982 Long term (current) use of aspirin: Secondary | ICD-10-CM

## 2021-03-21 DIAGNOSIS — Z9889 Other specified postprocedural states: Secondary | ICD-10-CM

## 2021-06-05 DIAGNOSIS — Z95810 Presence of automatic (implantable) cardiac defibrillator: Secondary | ICD-10-CM

## 2021-06-07 ENCOUNTER — Other Ambulatory Visit (INDEPENDENT_AMBULATORY_CARE_PROVIDER_SITE_OTHER): Payer: Medicare Other | Admitting: Cardiovascular Disease

## 2021-06-07 LAB — REMOTE CARDIAC DEVICE MONITORING
AF Burden Percentage: 0
ICD Shock Recent Count: 0
RV Pacing Percentage: 1.06

## 2021-06-23 IMAGING — CT CT MAXILLOFACIAL W/O CM
3 series · 16 of 47 positions shown, 19 images · non-contrast
Comparison: None.

CLINICAL DATA: Status post fall.

EXAM:
CT HEAD WITHOUT CONTRAST
CT MAXILLOFACIAL WITHOUT CONTRAST
TECHNIQUE: Multidetector CT imaging of the head and maxillofacial structures
were performed using the standard protocol without intravenous
contrast. Multiplanar CT image reconstructions of the maxillofacial
structures were also generated.

[Series 3: facialbone 2.0 st · axial · 0.34mm/px · z∈[+1165,+1315]mm · 10 of 89 slices shown, 13 images]
[im 7/89  brain]
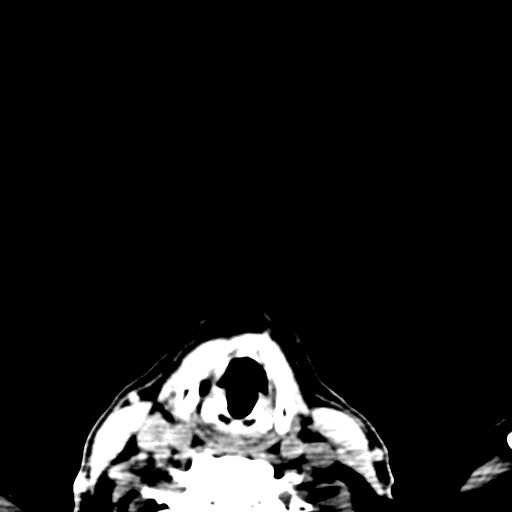
[im 7/89  bone]
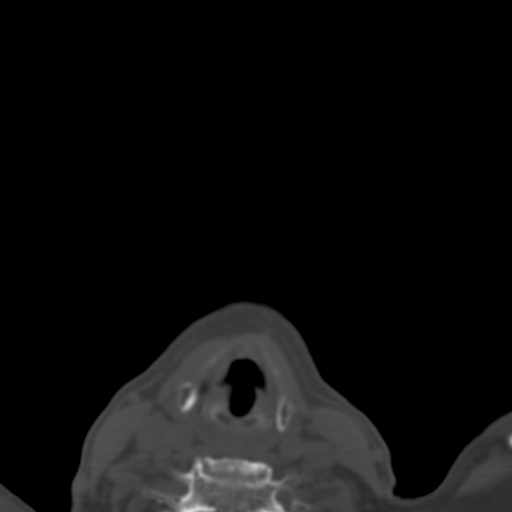
[im 16/89  bone]
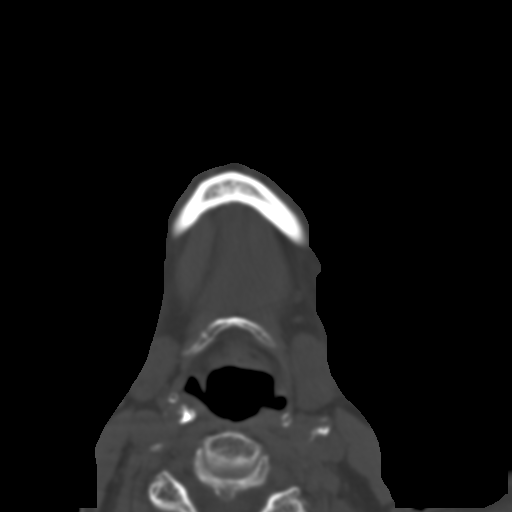
[im 25/89  bone]
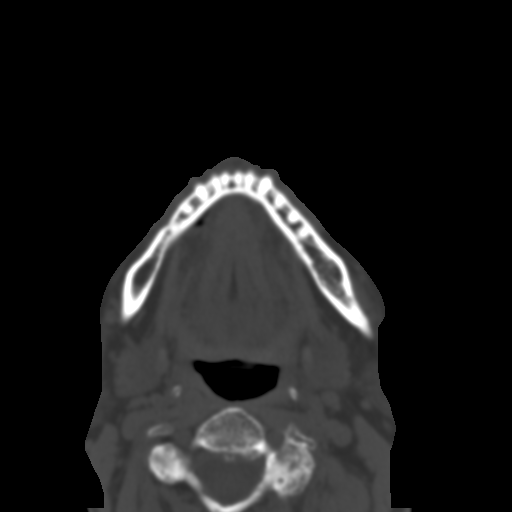
[im 31/89  bone]
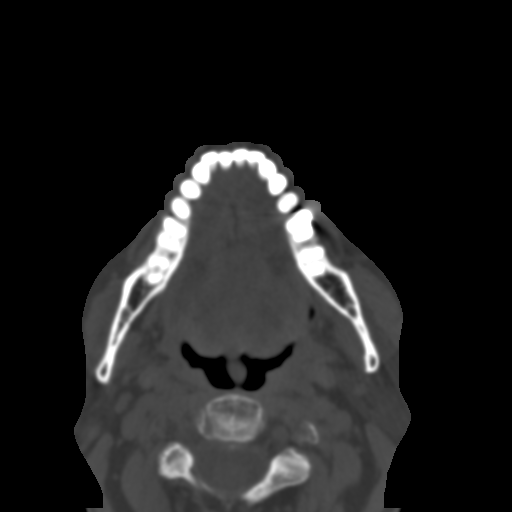
[im 40/89  brain]
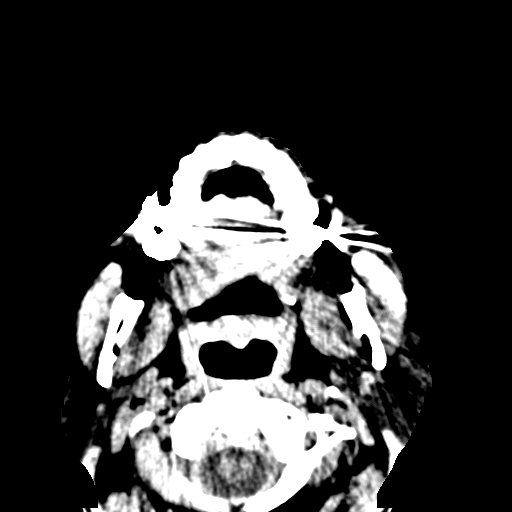
[im 40/89  bone]
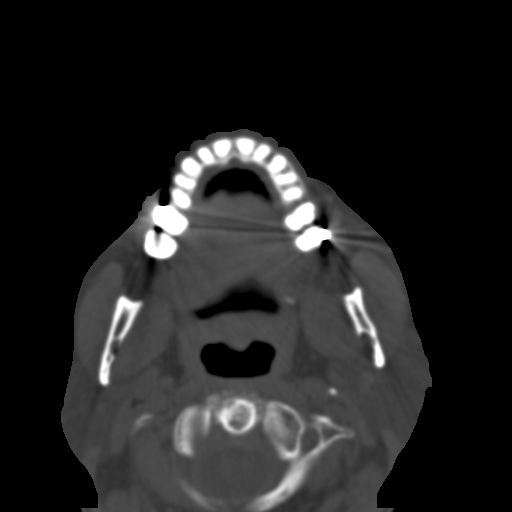
[im 49/89  bone]
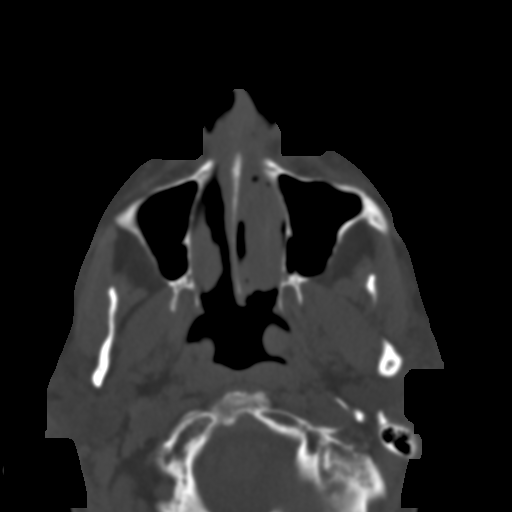
[im 58/89  bone]
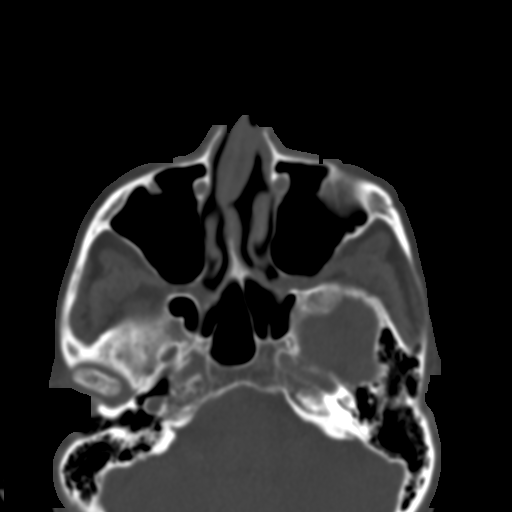
[im 67/89  bone]
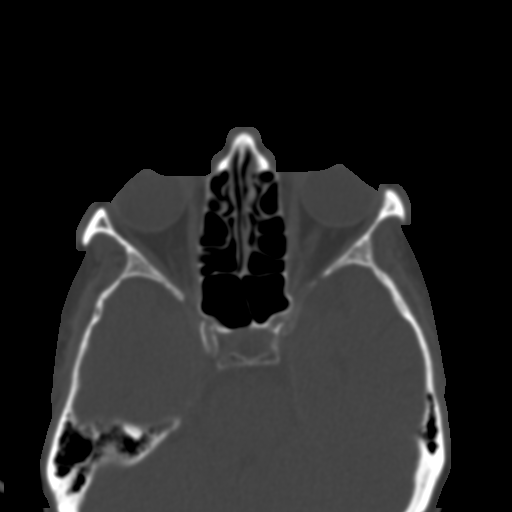
[im 73/89  brain]
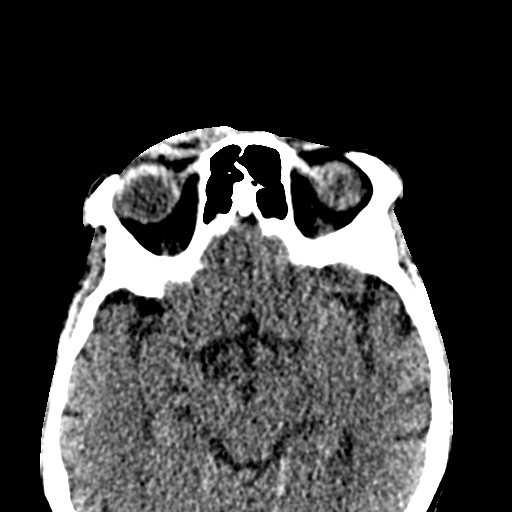
[im 73/89  bone]
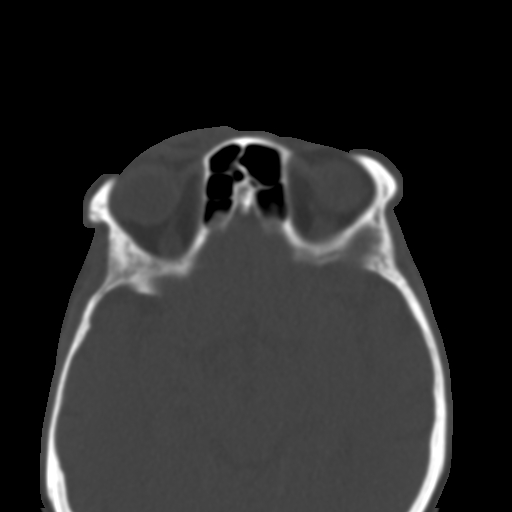
[im 82/89  bone]
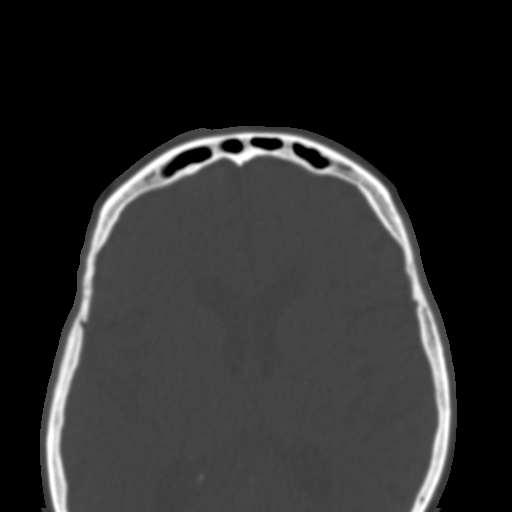

[Series 7: facialbone 2.0 cor st · coronal · 0.34mm/px · 3 of 73 slices shown]
[im 25/73  bone]
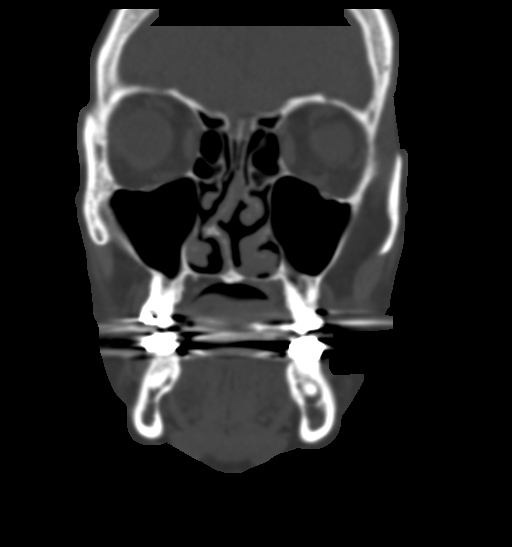
[im 33/73  bone]
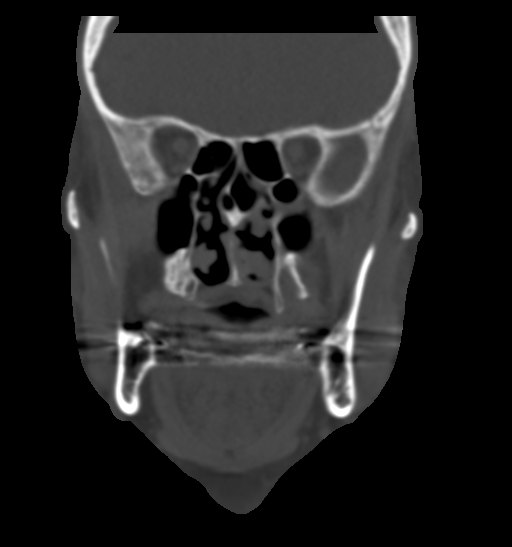
[im 41/73  bone]
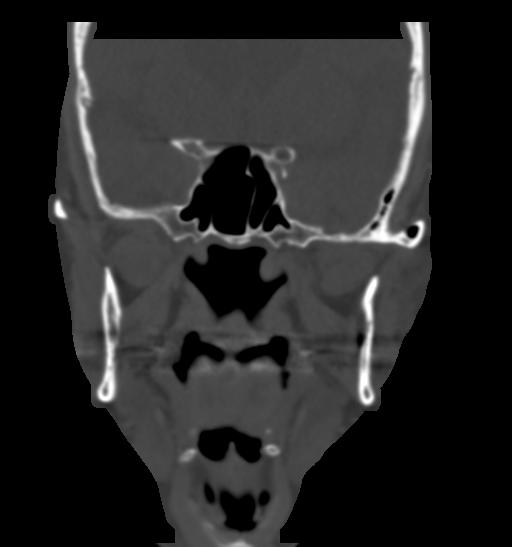

[Series 8: facialbone 2.0 sag st · sagittal · 0.36mm/px · 3 of 73 slices shown]
[im 25/73  bone]
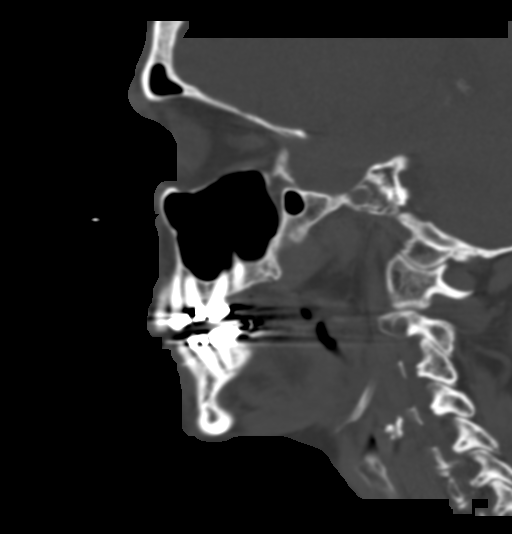
[im 37/73  bone]
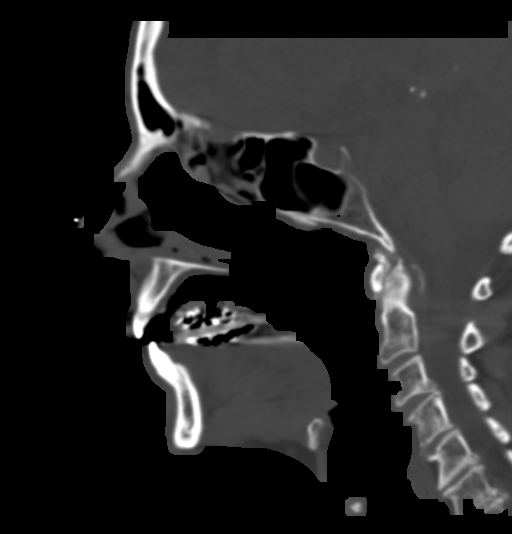
[im 49/73  bone]
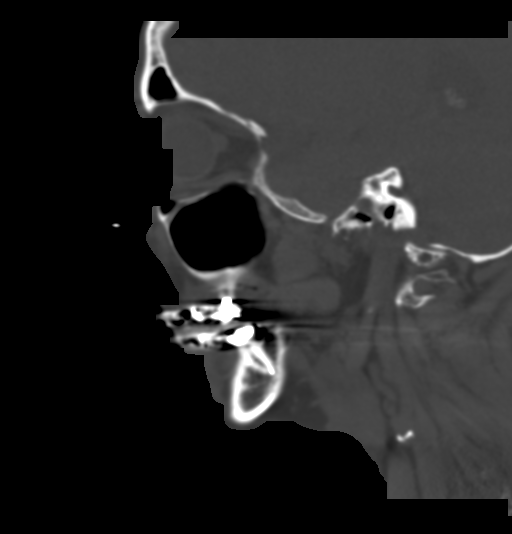

[16 of 47 positions shown; findings below may reference images not displayed]

FINDINGS: CT HEAD FINDINGS

Brain: There is mild cerebral atrophy with widening of the
extra-axial spaces and ventricular dilatation.
There are areas of decreased attenuation within the white matter
tracts of the supratentorial brain, consistent with microvascular
disease changes.

Vascular: No hyperdense vessel or unexpected calcification.

Skull: Normal. Negative for fracture or focal lesion.

Other: There is mild to moderate severity right supraorbital and
right frontal scalp soft tissue swelling.

CT MAXILLOFACIAL FINDINGS

Osseous: No fracture or mandibular dislocation. No destructive
process.

Orbits: Negative. No traumatic or inflammatory finding.

Sinuses: Clear.

Soft tissues: There is mild to moderate severity right supraorbital
and right frontal scalp soft tissue swelling. An associated right
frontal scalp hematoma is noted.
IMPRESSION: 1. Mild to moderate severity right supraorbital and right frontal
scalp soft tissue swelling with associated right frontal scalp
hematoma.
2. No evidence of acute fracture or acute intracranial process.

## 2021-06-23 IMAGING — DX DG KNEE 1-2V*R*
1 series · 2 of 2 positions shown · non-contrast
Comparison: None.

CLINICAL DATA: Pain after fall

EXAM:
RIGHT KNEE - 1-2 VIEW

[Series 1: knee · 0.14mm/px · 2 of 2 slices shown]
[im 1/2]
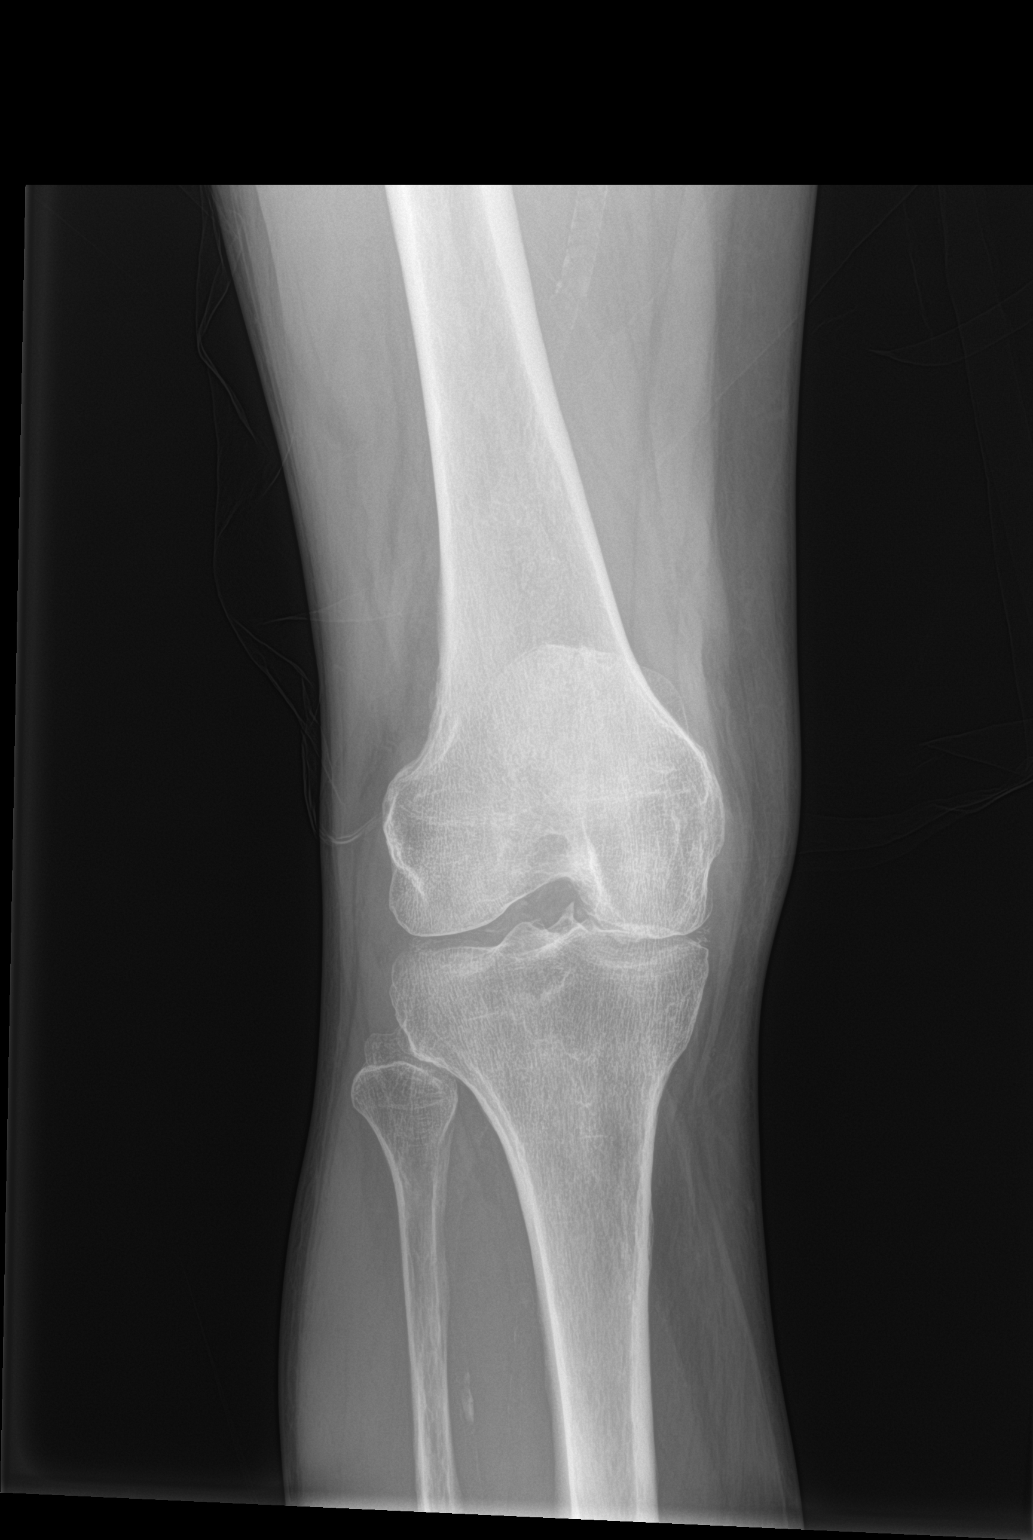
[im 2/2]
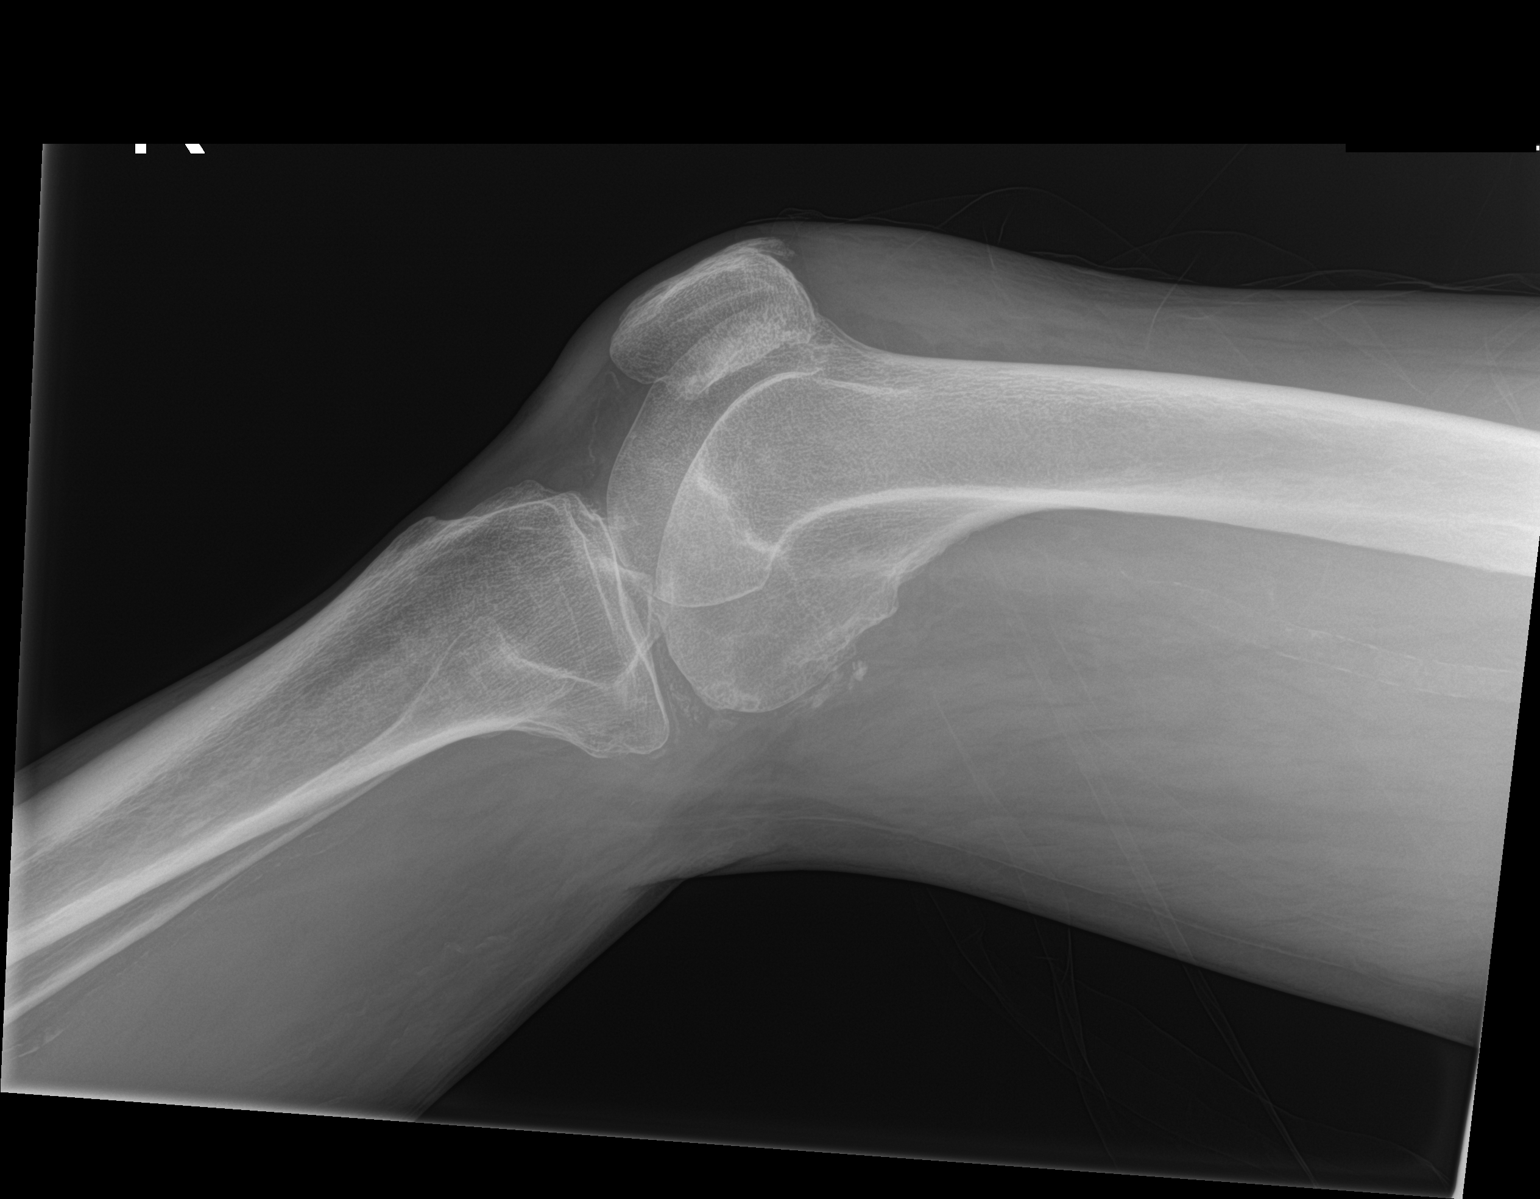

[2 of 2 positions shown; findings below may reference images not displayed]

FINDINGS: No evidence of fracture, dislocation, or joint effusion. No evidence
of arthropathy or other focal bone abnormality. Soft tissues are
unremarkable.
IMPRESSION: Negative.

## 2021-07-28 ENCOUNTER — Ambulatory Visit (INDEPENDENT_AMBULATORY_CARE_PROVIDER_SITE_OTHER): Payer: Medicare Other | Admitting: Cardiovascular Disease

## 2021-08-04 ENCOUNTER — Other Ambulatory Visit (INDEPENDENT_AMBULATORY_CARE_PROVIDER_SITE_OTHER): Payer: Self-pay | Admitting: Cardiovascular Disease

## 2021-08-04 DIAGNOSIS — E7800 Pure hypercholesterolemia, unspecified: Secondary | ICD-10-CM

## 2021-09-04 DIAGNOSIS — Z95810 Presence of automatic (implantable) cardiac defibrillator: Secondary | ICD-10-CM

## 2021-09-04 DIAGNOSIS — I469 Cardiac arrest, cause unspecified: Secondary | ICD-10-CM

## 2021-09-09 NOTE — Progress Notes (Signed)
[facility]    Chief Complaint   Patient presents with    Follow-up     6 month F/u post R CEA 02/22/2021 by Dr. Zollie Pee      History of Present Illness   Tara Perry is a 76 y.o. female with a history of asymptomatic carotid stenosis and R CEA 12/13/202 by Dr. Zollie Pee. She presents in 59-month follow-up with carotid duplex performed in our vascular lab today.    Patient denies any recent symptoms of lateralizing neurologic ischemia including stroke, TIA, monocular visual changes, or amaurosis fugax.     She denies any other vascular concerns; she denies claudication, rest pain, or tissue loss.     She was followed closely with carotid duplex. At her office visit in October 2021, duplex demonstrated increased peak systolic velocity in the right internal carotid artery to 254 cm/s.  This is increased from 236 cm/s in the duplex performed in 05/07/2019. Additionally the ICA to CCA ratio has increased to 2.9, from 2.5 in 04/2019.  She has known CKD stage III with recent GFR of 31 and therefore CTA was not recommended.  MRA was ordered but completion was delayed due to her severe claustrophobia. MRA performed on 12/14/2020 demonstrated extensive atherosclerotic plaque right carotid bifurcation resulting in approximately 80-90% stenosis of the proximal right internal carotid artery. Moderate atherosclerotic plaque at the left carotid bifurcation resulting in approximately 60-70% stenosis.     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  Other past medical history includes diabetes mellitus, gout and cardiomyopathy. She has history of DVT in the left upper extremity after its placement and has been on Eliquis therapy, managed by hematology.    She is compliant with Aspirin 81mg , Atorvastatin 40mg , and Eliquis 2.5mg  BID.  Past Medical History     Past Medical History:   Diagnosis Date    Anemia 08/2018    Atrophic vaginitis 08/30/2018    Bilateral cataracts     I have  implants    Cardiac arrest 08/17/2018    VF arrest. She received 3 shocks, 1 round of epi with successful ROSC. 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/2018    per patient, he only sees peripheral view    Diabetes mellitus 1992    Encounter for blood transfusion     unsure of dates    Gout spondylitis     Gout synovitis     Hypertension     LBBB (left bundle branch block)     LBBB chronic    NICM (nonischemic cardiomyopathy) 08/2018    NICM, EF 20%    Renal insufficiency     Respiratory failure 08/2018    Hypoxic/anoxic respiratory failure, extubated 08/20/2018    Type 2 diabetes mellitus, controlled     Viral cardiomyopathy 2008     Past Surgical History:   Procedure Laterality Date    COLONOSCOPY  2002    normal    EGD N/A 09/01/2018    Procedure: EGD;  Surgeon: Lilia Pro, MD;  Location: [facility];  Service: Gastroenterology;  Laterality: N/A;    ENDARTERECTOMY, CAROTID ARTERY Right 02/22/2021    Procedure: RIGHT CAROTID ARTERY ENDARTERECTOMY WITH NEUROMONITORING;  Surgeon: Benita Gutter, MD;  Location: [facility];  Service: Cardiovascular;  Laterality: Right;  EXPLORATORY LAPAROTOMY      EYE SURGERY      ICD IMPLANT BIV N/A 08/26/2018    Procedure: ICD IMPLANT BIV;  Surgeon: Len Childs, MD;  Location: [facility];  Service: Cardiovascular;  Laterality: N/A;  medtronic    INSERT / REPLACE / REMOVE PACEMAKER      Difibrillator August 26, 2018    LHC W/ CORONARY ANGIOS AND LV Left 08/21/2018    Procedure: LHC W/ CORONARY ANGIOS AND LV;  Surgeon: Burna Forts, MD;  Location: [facility];  Service: Cardiovascular;  Laterality: Left;    SMALL INTESTINE SURGERY  01/2013     Allergies     Allergies   Allergen Reactions    Shellfish-Derived Products Anaphylaxis     Medications       Current Outpatient Medications:     apixaban (Eliquis) 2.5 MG, Take 1 tablet (2.5 mg) by mouth  every 12 (twelve) hours, Disp: 180 tablet, Rfl: 3    aspirin EC 81 MG EC tablet, Take 1 tablet (81 mg) by mouth daily, Disp: , Rfl:     atorvastatin (LIPITOR) 40 MG tablet, Take 2 tablets (80 mg) by mouth nightly, Disp: 90 tablet, Rfl: 1    carvedilol (Coreg) 12.5 MG tablet, Take 1 tablet (12.5 mg) by mouth 2 (two) times daily with meals, Disp: 180 tablet, Rfl: 3    glimepiride (AMARYL) 2 MG tablet, Take 1 tablet (2 mg) by mouth daily, Disp: , Rfl:     Jardiance 25 MG tablet, 1 tablet (25 mg) every morning, Disp: , Rfl:     metFORMIN (GLUCOPHAGE) 500 MG tablet, Take 1 tablet (500 mg total) by mouth 2 (two) times daily Morning and night (Patient taking differently: Take 1 tablet (500 mg) by mouth 2 (two) times daily with meals), Disp: , Rfl:     Multiple Vitamins-Minerals (CENTRUM PO), Take by mouth daily, Disp: , Rfl:     sacubitril-valsartan (Entresto) 49-51 MG Tab per tablet, Take 1 tablet by mouth 2 (two) times daily, Disp: 180 tablet, Rfl: 3    vitamin B-12 (CYANOCOBALAMIN) 100 MCG tablet, Take 10 tablets (1,000 mcg) by mouth daily, Disp: , Rfl:     Family History   Problem Relation Age of Onset    Cancer Father 28        bladder    Diabetes Father     Heart attack Father      Social History     Socioeconomic History    Marital status: Widowed   Tobacco Use    Smoking status: Never    Smokeless tobacco: Never   Vaping Use    Vaping Use: Never used   Substance and Sexual Activity    Alcohol use: Not Currently     Alcohol/week: 1.0 standard drink of alcohol     Types: 1 Glasses of wine per week     Comment: rarely    Drug use: No    Sexual activity: Not Currently     Physical Exam     Vitals:    09/19/21 0954 09/19/21 1039   BP: 112/63 115/71   BP Site: Right arm Left arm   Patient Position: Sitting Sitting   Cuff Size: Medium Medium   Pulse: 75 75   Temp: 98.2 F (36.8 C)    TempSrc: Temporal    SpO2: 95%    Weight: 61.4 kg (135 lb 6.4 oz)    Height: 1.626 m (5\' 4" )  Body mass index is 23.24  kg/m.    Physical Exam  Vitals reviewed.   Constitutional:       General: She is not in acute distress.     Appearance: Normal appearance.   HENT:      Head: Normocephalic and atraumatic.   Cardiovascular:      Rate and Rhythm: Normal rate and regular rhythm.      Pulses:           Carotid pulses are 2+ on the right side with bruit and 2+ on the left side with bruit.       Radial pulses are 2+ on the right side and 2+ on the left side.        Dorsalis pedis pulses are 1+ on the right side and 1+ on the left side.        Posterior tibial pulses are 1+ on the right side and 1+ on the left side.      Heart sounds: Normal heart sounds.   Pulmonary:      Effort: Pulmonary effort is normal.      Breath sounds: Normal breath sounds.   Skin:     General: Skin is warm and dry.      Comments: L neck incision well-healed   Neurological:      General: No focal deficit present.      Mental Status: She is alert and oriented to person, place, and time.   Psychiatric:         Attention and Perception: Attention normal.         Mood and Affect: Mood and affect normal.         Speech: Speech normal.         Behavior: Behavior normal. Behavior is cooperative.         Thought Content: Thought content normal.        Imaging   09/19/2021 Carotid Duplext performed in our vascular lab:   1. The endarterectomized right carotid bifurcation appear patent with elevated velocities. There is evidence of minimal residual disease in the right internal carotid artery. Hx of CEA.   2. Luminal stenosis of the left internal carotid artery is estimated to be in the 1-49% range.   3. Antegrade blood flow is present in both vertebral arteries.   4. Patent bilateral subclavian arteries.     03/21/2021 Carotid Duplex performed in our vascular lab:  1. The extracranial carotid arteries, proximal vertebral and proximal subclavian arteries were evaluated with high resolution imaging, color Doppler, and spectral Doppler techniques. Findings are as follows:    2. Widely patent right carotid endarterectomy.   3. There is some intimal thickening throughout the left carotid system. The peak systolic velocities and the small amount of heterogeneous plaque formation at the left carotid bulb, which extends to the origin of the internal carotid artery. This appears to be creating a 50-69% stenosis.   4. The external carotid arteries are widely patent.   5. Antegrade flow was in the vertebral arteries bilaterally.   6. The subclavian arteries are widely patent.     Results reviewed with patient in the office.  Assessment and Plan   Tara Perry is a 75 y.o. female with a history of asymptomatic carotid stenosis and R CEA 12/13/202 by Dr. Zollie Pee. She remains asymptomatic. Carotid duplex performed today demonstrates residual R ICA disease with PSV of 169 cm/sec and EDV of 49 cm/sec resulting in a 50-69% stenosis of the R  ICA based on velocities, with L ICA stenosis of 1-49%. 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    - Continue Aspirin and Atorvastatin for medical management of arterial disease.  Continue Eliquis as prescribed by hematology.    - Call 911 and proceed directly to the closest emergency department for any s/s of lateralizing neurologic ischemia, stroke, TIA, or amaurosis fugax.     - Recommend follow-up in 27-months with carotid duplex.       We appreciate the opportunity to be involved in your patient's care. Please do not hesitate to contact the office with any questions or concerns.     Sincerely,  Angelines McCaffrey-Lazo, FNP-C  Vascular Nurse Practitioner  [facility]  [address]  [place], [place] [address]  T  [phone] F [phone]     Incident to service performed with physician present in the office in accordance with this patient's established plan of care.  Maseer Bade, MD was present at the end of the visit for plan  confirmation.

## 2021-09-14 ENCOUNTER — Other Ambulatory Visit (INDEPENDENT_AMBULATORY_CARE_PROVIDER_SITE_OTHER): Payer: Medicare Other | Admitting: Cardiovascular Disease

## 2021-09-14 LAB — REMOTE CARDIAC DEVICE MONITORING
AF Burden Percentage: 0
ICD Shock Recent Count: 0
RV Pacing Percentage: 1.09

## 2021-09-15 ENCOUNTER — Encounter (INDEPENDENT_AMBULATORY_CARE_PROVIDER_SITE_OTHER): Payer: Self-pay

## 2021-09-15 NOTE — Progress Notes (Signed)
Appt Date/Time: 09/19/21 @  10:30 am    76 y.o. female   PCP: Tammi Sou, MD  Referring Physician:         Appointment Type: Follow-Up  Imaging: [place] Today 10:00 carotid    Chief Complaint/HPI: 6 month F/u post R CEA 02/22/2021 by Dr. Zollie Pee   -Hx DVT in the left upper extremity     Date of Last Visit:  03/21/21  50-months with carotid duplex     Allergies:   Allergies   Allergen Reactions    Shellfish-Derived Products Anaphylaxis         Selected Medications:  Aspirin, Atorvastatin, Eliquis, and Metformin    Focused Prior Medical History: Diabetes and Kidney Disease             Smoker           Former Smoker     x     Never Smoker      Vital Signs this Visit Pulses  HT  BP R BP L Temp   Rad Ulnar Brach Fem Pop DP PT        R          WT  Pulse R Pulse L SpO2       L          Imaging results:        Plan of Care:

## 2021-09-19 ENCOUNTER — Ambulatory Visit (INDEPENDENT_AMBULATORY_CARE_PROVIDER_SITE_OTHER): Payer: Medicare Other | Admitting: Specialist

## 2021-09-19 ENCOUNTER — Ambulatory Visit (INDEPENDENT_AMBULATORY_CARE_PROVIDER_SITE_OTHER): Payer: Medicare Other

## 2021-09-19 ENCOUNTER — Encounter (INDEPENDENT_AMBULATORY_CARE_PROVIDER_SITE_OTHER): Payer: Self-pay | Admitting: Specialist

## 2021-09-19 VITALS — BP 115/71 | HR 75 | Temp 98.2°F | Ht 64.0 in | Wt 135.4 lb

## 2021-09-19 DIAGNOSIS — I6523 Occlusion and stenosis of bilateral carotid arteries: Secondary | ICD-10-CM

## 2021-10-05 ENCOUNTER — Telehealth (INDEPENDENT_AMBULATORY_CARE_PROVIDER_SITE_OTHER): Payer: Self-pay

## 2021-10-05 ENCOUNTER — Other Ambulatory Visit (INDEPENDENT_AMBULATORY_CARE_PROVIDER_SITE_OTHER): Payer: Self-pay | Admitting: Cardiovascular Disease

## 2021-10-05 DIAGNOSIS — E7800 Pure hypercholesterolemia, unspecified: Secondary | ICD-10-CM

## 2021-10-05 NOTE — Telephone Encounter (Signed)
Patient called in stating she set off a metal detector twice yesterday and did not realize until later that it was likely caused by her ICD.     Patient does have her device card but did not even realize that could have been the cause of the metal detector triggering.     Patient phoned in to see if interrogation/device check is necessary. Advised patient no need for any interrogation/device check and to present her card next time. Patient verbalized understanding of all.

## 2021-10-14 ENCOUNTER — Encounter (INDEPENDENT_AMBULATORY_CARE_PROVIDER_SITE_OTHER): Payer: Self-pay | Admitting: Cardiovascular Disease

## 2021-10-14 ENCOUNTER — Ambulatory Visit (INDEPENDENT_AMBULATORY_CARE_PROVIDER_SITE_OTHER): Payer: Medicare Other | Admitting: Cardiovascular Disease

## 2021-10-14 VITALS — BP 102/66 | HR 80 | Wt 134.0 lb

## 2021-10-14 DIAGNOSIS — I5042 Chronic combined systolic (congestive) and diastolic (congestive) heart failure: Secondary | ICD-10-CM

## 2021-10-14 DIAGNOSIS — E7800 Pure hypercholesterolemia, unspecified: Secondary | ICD-10-CM

## 2021-10-14 DIAGNOSIS — Z01810 Encounter for preprocedural cardiovascular examination: Secondary | ICD-10-CM

## 2021-10-14 MED ORDER — ENTRESTO 49-51 MG PO TABS
1.0000 | ORAL_TABLET | Freq: Two times a day (BID) | ORAL | 3 refills | Status: DC
Start: 2021-10-14 — End: 2022-12-11

## 2021-10-14 MED ORDER — APIXABAN 2.5 MG PO TABS
2.5000 mg | ORAL_TABLET | Freq: Two times a day (BID) | ORAL | 3 refills | Status: DC
Start: 2021-10-14 — End: 2022-10-09

## 2021-10-14 MED ORDER — CARVEDILOL 12.5 MG PO TABS
12.5000 mg | ORAL_TABLET | Freq: Two times a day (BID) | ORAL | 3 refills | Status: DC
Start: 2021-10-14 — End: 2022-11-07

## 2021-10-14 MED ORDER — ATORVASTATIN CALCIUM 40 MG PO TABS
80.0000 mg | ORAL_TABLET | Freq: Every evening | ORAL | 3 refills | Status: DC
Start: 2021-10-14 — End: 2022-10-02

## 2021-10-14 NOTE — Progress Notes (Signed)
[facility] PROGRESS NOTE    [facility] OFFICE      [facility] OFFICE -CARDIOLOGY  [address]  [place] [address]  Dept: [phone]  Dept Fax: [phone]       Patient Name: Tara Perry    Date of Visit:  October 14, 2021  Date of Birth: 06-03-1945  AGE: 76 y.o.  Medical Record #: [id number]  Requesting Physician: Tammi Sou, MD      CHIEF COMPLAINT: Cardiomyopathy      HISTORY OF PRESENT ILLNESS:    She is a pleasant 76 y.o. female who presents today for management of chronic systolic CHF, prior VF arrest, and carotid disease.  Since her last cardiology office visit in November 2022, she underwent a right carotid endarterectomy on 02/22/2021 without complications.  Carotid duplex study subsequently on 03/21/2021 showed a 50 to 69% left carotid stenosis, widely patent right carotid endarterectomy.  Subsequent follow-up carotid on 09/19/2021 also showed a patent endarterectomy.  Last hemoglobin A1c was 8.7 on 02/16/2021.  Denies any new symptoms of shortness of breath, chest pain, or paroxysmal nocturnal dyspnea/orthopnea.      PAST MEDICAL HISTORY: She has a past medical history of Anemia (08/2018), Atrophic vaginitis (08/30/2018), Bilateral cataracts, Cardiac arrest (08/17/2018), Chronic vulvitis (08/30/2018), CKD (chronic kidney disease), stage III, Congestive heart failure, Detached retina, right (08/24/2018), Diabetes mellitus (1992), Encounter for blood transfusion, Gout spondylitis, Gout synovitis, Hypertension, LBBB (left bundle branch block), NICM (nonischemic cardiomyopathy) (08/2018), Renal insufficiency, Respiratory failure (08/2018), Type 2 diabetes mellitus, controlled, and Viral cardiomyopathy (2008). She has a past surgical history that includes Colonoscopy (2002); Exploratory laparotomy; LHC w/ Coronary Angios and LV (Left, 08/21/2018); ICD Implant BiV (N/A, 08/26/2018); EGD (N/A, 09/01/2018); Eye surgery; Small intestine surgery  (01/2013); Insert / replace / remove pacemaker; and ENDARTERECTOMY, CAROTID ARTERY (Right, 02/22/2021).    ALLERGIES:   Allergies   Allergen Reactions    Shellfish-Derived Products Anaphylaxis       MEDICATIONS:   Current Outpatient Medications:     aspirin EC 81 MG EC tablet, Take 1 tablet (81 mg) by mouth daily, Disp: , Rfl:     glimepiride (AMARYL) 2 MG tablet, Take 1 tablet (2 mg) by mouth daily, Disp: , Rfl:     Jardiance 25 MG tablet, 1 tablet (25 mg) every morning, Disp: , Rfl:     metFORMIN (GLUCOPHAGE) 500 MG tablet, Take 1 tablet (500 mg total) by mouth 2 (two) times daily Morning and night (Patient taking differently: Take 1 tablet (500 mg) by mouth 2 (two) times daily with meals), Disp: , Rfl:     Multiple Vitamins-Minerals (CENTRUM PO), Take by mouth daily, Disp: , Rfl:     vitamin B-12 (CYANOCOBALAMIN) 100 MCG tablet, Take 1 tablet (100 mcg) by mouth daily, Disp: , Rfl:     apixaban (Eliquis) 2.5 MG, Take 1 tablet (2.5 mg) by mouth every 12 (twelve) hours, Disp: 180 tablet, Rfl: 3    atorvastatin (LIPITOR) 40 MG tablet, Take 2 tablets (80 mg) by mouth nightly, Disp: 180 tablet, Rfl: 3    carvedilol (Coreg) 12.5 MG tablet, Take 1 tablet (12.5 mg) by mouth 2 (two) times daily with meals, Disp: 180 tablet, Rfl: 3    sacubitril-valsartan (Entresto) 49-51 MG Tab per tablet, Take 1 tablet by mouth 2 (two) times daily, Disp: 180 tablet, Rfl: 3     FAMILY HISTORY: family history includes Cancer (age of onset: 65) in her father; Diabetes in her  father; Heart attack in her father.    SOCIAL HISTORY: She reports that she has never smoked. She has never used smokeless tobacco. She reports that she does not currently use alcohol after a past usage of about 1.0 standard drink of alcohol per week. She reports that she does not use drugs.    PHYSICAL EXAMINATION    Visit Vitals  BP 102/66 (BP Site: Left arm, Patient Position: Sitting, Cuff Size: Medium)   Pulse 80   Wt 60.8 kg (134 lb)   BMI 23.00 kg/m       General  Appearance:  A well-appearing female in no acute distress.    Skin: Warm and dry to touch   Head: Normocephalic   Eyes: EOMS Intact   OZH:[id number]  Neck: JVP normal, no carotid bruit    Chest: Clear to auscultation bilaterally   Cardiovascular: Regular rhythm no murm   Abdomen: no guarding  Extremities: Warm without edema.   Neuro: Alert and oriented x3.        ECG: Not done today      LABS:   Lab Results   Component Value Date    WBC 10.66 (H) 02/23/2021    HGB 11.5 02/23/2021    HCT 34.8 02/23/2021    PLT 227 02/23/2021     Lab Results   Component Value Date    GLU 127 (H) 02/23/2021    BUN 24.0 (H) 02/23/2021    CREAT 1.2 (H) 02/23/2021    NA 138 02/23/2021    K 4.3 02/23/2021    CL 106 02/23/2021    CO2 21 02/23/2021    AST 16 08/03/2020    ALT 12 08/03/2020     Lab Results   Component Value Date    MG 2.0 02/23/2021    TSH 1.06 09/13/2012    HGBA1C 8.7 (H) 02/16/2021    BNP 139 (H) 08/16/2018     Lab Results   Component Value Date    CHOL 161 10/23/2018    TRIG 223 (H) 10/23/2018    HDL 42 10/23/2018    LDL 74 10/23/2018          IMPRESSION:   Tara Perry is a 77 y.o. female with the following problems:     Chronic systolic heart failure, NYHA class II, euvolemic and well compensated  Echocardiogram 08/19/2018: EF 20%, dyskinesis of the basal/mid anterior, anteroseptal, and inferoseptal LV, moderate hypokinesis of the basal inferior wall, normal RV function, down from 40% on October 2017 echo  Echocardiogram 11/05/2018: EF 40%  Nonischemic cardiomyopathy, s/p cardiac catheterization 08/21/2018 with no CAD  Prior intolerance to spironolactone/MRAs  Prior ventricular fibrillation arrest, witnessed, hospitalized 08/16/2018 through 08/27/2018, successfully resuscitated by EMS, intubated, underwent successful therapeutic hypothermia protocol then extubated, s/p biventricular AICD placed 08/26/2018  Prior left upper extremity DVT requiring hospitalization 08/30/2018 through 09/08/2018 with anticoagulation at that time complicated  by hematemesis requiring endoscopy showing nonbleeding duodenal ulcer treated with epinephrine and cautery/clipping  Hypertension, controlled   Hypercholesterolemia, on statin therapy  Chronic left bundle branch block  Type 2 diabetes  Chronic kidney disease  Bilateral carotid artery disease  Carotid duplex study 03/08/2018: Bilateral 50 to 69% ICA disease  Carotid duplex 05/07/2019: 70-99% right ICA, 50 to 69% left ICA  Possible symptomatic TIA 05/19/2019 (left facial numbness leading to ER visit)  MRA 06/16/2019: 75% right ICA, 40% left ICA  Carotid duplex 12/15/2019: 70-99% right ICA, 50 to 69% left ICA  RECOMMENDATIONS:     Doing well from the standpoint of her prior history of heart failure, euvolemic on exam with no evidence of volume overload  She is due to get lipids done through her primary, we will look out for those as she needs aggressive lipid reduction with her carotid disease, with a goal LDL of well under 70 if not in the 50s  Refilled her cardiac medications  Follow-up in 6 months time                                                     Orders Placed This Encounter   Procedures    APP Office Visit ([facility] [place])       Orders Placed This Encounter   Medications    atorvastatin (LIPITOR) 40 MG tablet     Sig: Take 2 tablets (80 mg) by mouth nightly     Dispense:  180 tablet     Refill:  3    apixaban (Eliquis) 2.5 MG     Sig: Take 1 tablet (2.5 mg) by mouth every 12 (twelve) hours     Dispense:  180 tablet     Refill:  3    carvedilol (Coreg) 12.5 MG tablet     Sig: Take 1 tablet (12.5 mg) by mouth 2 (two) times daily with meals     Dispense:  180 tablet     Refill:  3    sacubitril-valsartan (Entresto) 49-51 MG Tab per tablet     Sig: Take 1 tablet by mouth 2 (two) times daily     Dispense:  180 tablet     Refill:  3       SIGNED:    Donnie Mesa, MD          This note was generated by the Dragon speech recognition and may contain errors or omissions not intended by the user. Grammatical errors, random  word insertions, deletions, pronoun errors, and incomplete sentences are occasional consequences of this technology due to software limitations. Not all errors are caught or corrected. If there are questions or concerns about the content of this note or information contained within the body of this dictation, they should be addressed directly with the author for clarification.

## 2021-12-02 ENCOUNTER — Other Ambulatory Visit (INDEPENDENT_AMBULATORY_CARE_PROVIDER_SITE_OTHER): Payer: Self-pay | Admitting: Cardiovascular Disease

## 2021-12-02 ENCOUNTER — Ambulatory Visit (INDEPENDENT_AMBULATORY_CARE_PROVIDER_SITE_OTHER): Payer: Medicare Other

## 2021-12-02 DIAGNOSIS — Z4502 Encounter for adjustment and management of automatic implantable cardiac defibrillator: Secondary | ICD-10-CM

## 2021-12-02 LAB — IN OFFICE CARDIAC DEVICE MONITORING: AF Burden Percentage: 0.1

## 2021-12-02 NOTE — Progress Notes (Signed)
Patient was seen in office for a device check.      Evaluation of Medtronic CRT-D device showed normal battery, lead, and device performance. NSVT episodes were noted after last [facility] transmission with no RX.     All ICD therapy pathways were changed to B>AX per Medtronic advisory.      Patient's device is remotely monitored and she is scheduled to return to office for annual device check, and/or as needed.

## 2021-12-04 DIAGNOSIS — I469 Cardiac arrest, cause unspecified: Secondary | ICD-10-CM

## 2021-12-04 DIAGNOSIS — Z95810 Presence of automatic (implantable) cardiac defibrillator: Secondary | ICD-10-CM

## 2021-12-05 ENCOUNTER — Other Ambulatory Visit (INDEPENDENT_AMBULATORY_CARE_PROVIDER_SITE_OTHER): Payer: Medicare Other | Admitting: Cardiovascular Disease

## 2021-12-05 LAB — REMOTE CARDIAC DEVICE MONITORING
AF Burden Percentage: 0
ICD Shock Recent Count: 0
RV Pacing Percentage: 1.4

## 2022-01-02 ENCOUNTER — Other Ambulatory Visit: Payer: Self-pay

## 2022-01-02 ENCOUNTER — Other Ambulatory Visit: Payer: Medicare Other

## 2022-01-02 DIAGNOSIS — D508 Other iron deficiency anemias: Secondary | ICD-10-CM

## 2022-01-02 DIAGNOSIS — E538 Deficiency of other specified B group vitamins: Secondary | ICD-10-CM

## 2022-01-04 ENCOUNTER — Ambulatory Visit: Payer: Medicare Other | Admitting: Family Nurse Practitioner

## 2022-01-11 ENCOUNTER — Other Ambulatory Visit (FREE_STANDING_LABORATORY_FACILITY): Payer: Medicare Other

## 2022-01-11 ENCOUNTER — Encounter: Payer: Self-pay | Admitting: Family Nurse Practitioner

## 2022-01-11 ENCOUNTER — Ambulatory Visit: Payer: Medicare Other | Attending: Family Nurse Practitioner | Admitting: Family Nurse Practitioner

## 2022-01-11 VITALS — BP 130/76 | HR 74 | Temp 97.0°F | Wt 135.3 lb

## 2022-01-11 DIAGNOSIS — I82622 Acute embolism and thrombosis of deep veins of left upper extremity: Secondary | ICD-10-CM

## 2022-01-11 DIAGNOSIS — Z86718 Personal history of other venous thrombosis and embolism: Secondary | ICD-10-CM | POA: Insufficient documentation

## 2022-01-11 DIAGNOSIS — E538 Deficiency of other specified B group vitamins: Secondary | ICD-10-CM

## 2022-01-11 DIAGNOSIS — D508 Other iron deficiency anemias: Secondary | ICD-10-CM

## 2022-01-11 DIAGNOSIS — I6521 Occlusion and stenosis of right carotid artery: Secondary | ICD-10-CM | POA: Insufficient documentation

## 2022-01-11 LAB — CBC AND DIFFERENTIAL
Absolute NRBC: 0 10*3/uL (ref 0.00–0.00)
Basophils Absolute Automated: 0.06 10*3/uL (ref 0.00–0.08)
Basophils Automated: 0.7 %
Eosinophils Absolute Automated: 0.22 10*3/uL (ref 0.00–0.44)
Eosinophils Automated: 2.5 %
Hematocrit: 39.8 % (ref 34.7–43.7)
Hgb: 13.1 g/dL (ref 11.4–14.8)
Immature Granulocytes Absolute: 0.02 10*3/uL (ref 0.00–0.07)
Immature Granulocytes: 0.2 %
Instrument Absolute Neutrophil Count: 6.02 10*3/uL (ref 1.10–6.33)
Lymphocytes Absolute Automated: 1.69 10*3/uL (ref 0.42–3.22)
Lymphocytes Automated: 18.9 %
MCH: 30 pg (ref 25.1–33.5)
MCHC: 32.9 g/dL (ref 31.5–35.8)
MCV: 91.3 fL (ref 78.0–96.0)
MPV: 9.8 fL (ref 8.9–12.5)
Monocytes Absolute Automated: 0.91 10*3/uL — ABNORMAL HIGH (ref 0.21–0.85)
Monocytes: 10.2 %
Neutrophils Absolute: 6.02 10*3/uL (ref 1.10–6.33)
Neutrophils: 67.5 %
Nucleated RBC: 0 /100 WBC (ref 0.0–0.0)
Platelets: 224 10*3/uL (ref 142–346)
RBC: 4.36 10*6/uL (ref 3.90–5.10)
RDW: 12 % (ref 11–15)
WBC: 8.92 10*3/uL (ref 3.10–9.50)

## 2022-01-11 LAB — COMPREHENSIVE METABOLIC PANEL
ALT: 16 U/L (ref 0–55)
AST (SGOT): 21 U/L (ref 5–41)
Albumin/Globulin Ratio: 1.3 (ref 0.9–2.2)
Albumin: 4.1 g/dL (ref 3.5–5.0)
Alkaline Phosphatase: 88 U/L (ref 37–117)
Anion Gap: 9 (ref 5.0–15.0)
BUN: 31 mg/dL — ABNORMAL HIGH (ref 7.0–21.0)
Bilirubin, Total: 0.5 mg/dL (ref 0.2–1.2)
CO2: 22 mEq/L (ref 17–29)
Calcium: 10.1 mg/dL (ref 7.9–10.2)
Chloride: 108 mEq/L (ref 99–111)
Creatinine: 1.5 mg/dL — ABNORMAL HIGH (ref 0.4–1.0)
Globulin: 3.2 g/dL (ref 2.0–3.6)
Glucose: 223 mg/dL — ABNORMAL HIGH (ref 70–100)
Potassium: 4.6 mEq/L (ref 3.5–5.3)
Protein, Total: 7.3 g/dL (ref 6.0–8.3)
Sodium: 139 mEq/L (ref 135–145)
eGFR: 35.6 mL/min/{1.73_m2} — AB (ref >=60)

## 2022-01-11 LAB — FERRITIN: Ferritin: 61.9 ng/mL (ref 4.60–204.00)

## 2022-01-11 LAB — HEMOLYSIS INDEX
Hemolysis Index: 29 Index — ABNORMAL HIGH (ref 0–24)
Hemolysis Index: 32 Index — ABNORMAL HIGH (ref 0–24)

## 2022-01-11 LAB — VITAMIN B12: Vitamin B-12: 1036 pg/mL — ABNORMAL HIGH (ref 211–911)

## 2022-01-11 NOTE — Progress Notes (Signed)
OFFICE VISIT - [facility]      Total time spent prior to the visit reviewing records and prior labs:  n/a  Total time spent face to face with patient: n/a  Total time spent after face to face for coordination of care and documentation:  n/a  Total time spent on encounter: n/a  Regarding: rpv LUE provoked DVT , RUE DVT     HPI/HEMATOLOGY HISTORY: Ms. Gonder is a 76 y.o. female with history of DM, NICM, CKD, UE VTE events.    Patient was admitted 6/5-6/16/20 for VF cardiac arrest- underwent hypothermic protocol. s/p BiV ICD placement 08/26/18. She presented to [facility] 08/30/18 with LUE swelling. She was found to have acute subclavian DVT. She was started on Lovenox therapeutic. Her course was complicated by hematemesis on 09/01/18. EGD showed large duodenal ulcer with visible vessels s/p cauterization, clipping. Also noted to have RUE extensive R basilic occlusive thrombus and phlebitis 09/06/18. She resumed anticoagulation and was discharged on Lovenox 1mg /kg BID after stability of hemoglobin on a/c.     Switched to Eliquis 5mg  BID from Lovenox in September.     COVID vaccinated    INTERVAL HISTORY:     12/13: Dr. Zollie Pee - s/p R CEA for high grade R ICA stenosis (80-99%), discharged on ASA 81mg  and Eliquis, statin.     Continues Eliquis 2.5mg  BID, ASA and statin. Taking B12 daily.    No bleeding noted. No UE swelling, chest pain or SOB.     MEDICATIONS/ALLERGIES:     Outpatient Medications Marked as Taking for the 01/11/22 encounter (Office Visit) with Lajuana Carry, FNP   Medication Sig Dispense Refill    apixaban (Eliquis) 2.5 MG Take 1 tablet (2.5 mg) by mouth every 12 (twelve) hours 180 tablet 3    aspirin EC 81 MG EC tablet Take 1 tablet (81 mg) by mouth daily      atorvastatin (LIPITOR) 40 MG tablet Take 2 tablets (80 mg) by mouth nightly 180 tablet 3    carvedilol (Coreg) 12.5 MG tablet Take 1 tablet (12.5 mg) by mouth 2 (two) times daily with meals 180 tablet 3    glimepiride (AMARYL) 2 MG tablet  Take 1 tablet (2 mg) by mouth daily      Jardiance 25 MG tablet 1 tablet (25 mg) every morning      metFORMIN (GLUCOPHAGE) 500 MG tablet Take 1 tablet (500 mg total) by mouth 2 (two) times daily Morning and night (Patient taking differently: Take 1 tablet (500 mg) by mouth 2 (two) times daily with meals)      Multiple Vitamins-Minerals (CENTRUM PO) Take by mouth daily      sacubitril-valsartan (Entresto) 49-51 MG Tab per tablet Take 1 tablet by mouth 2 (two) times daily 180 tablet 3    vitamin B-12 (CYANOCOBALAMIN) 100 MCG tablet Take 1 tablet (100 mcg) by mouth daily       Allergies   Allergen Reactions    Shellfish-Derived Products Anaphylaxis     PAST HISTORY:    Past Medical History:   Diagnosis Date    Anemia 08/2018    Atrophic vaginitis 08/30/2018    Bilateral cataracts     I have implants    Cardiac arrest 08/17/2018    VF arrest. She received 3 shocks, 1 round of epi with successful ROSC. 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    CKD (chronic kidney disease), stage III     Congestive heart failure  Detached retina, right 08/24/2018    per patient, he only sees peripheral view    Diabetes mellitus 1992    Encounter for blood transfusion     unsure of dates    Gout spondylitis     Gout synovitis     Hypertension     LBBB (left bundle branch block)     LBBB chronic    NICM (nonischemic cardiomyopathy) 08/2018    NICM, EF 20%    Renal insufficiency     Respiratory failure 08/2018    Hypoxic/anoxic respiratory failure, extubated 08/20/2018    Type 2 diabetes mellitus, controlled     Viral cardiomyopathy 2008      Past Surgical History:   Procedure Laterality Date    COLONOSCOPY  2002    normal    EGD N/A 09/01/2018    Procedure: EGD;  Surgeon: Lilia Pro, MD;  Location: [id number];  Service: Gastroenterology;  Laterality: N/A;    ENDARTERECTOMY, CAROTID ARTERY Right 02/22/2021    Procedure: RIGHT CAROTID ARTERY ENDARTERECTOMY WITH NEUROMONITORING;  Surgeon: Benita Gutter, MD;  Location:  [place] [facility];  Service: Cardiovascular;  Laterality: Right;    EXPLORATORY LAPAROTOMY      EYE SURGERY      ICD IMPLANT BIV N/A 08/26/2018    Procedure: ICD IMPLANT BIV;  Surgeon: Len Childs, MD;  Location: [facility];  Service: Cardiovascular;  Laterality: N/A;  medtronic    INSERT / REPLACE / REMOVE PACEMAKER      Difibrillator August 26, 2018    LHC W/ CORONARY ANGIOS AND LV Left 08/21/2018    Procedure: LHC W/ CORONARY ANGIOS AND LV;  Surgeon: Burna Forts, MD;  Location: [facility];  Service: Cardiovascular;  Laterality: Left;    SMALL INTESTINE SURGERY  01/2013     Family History   Problem Relation Age of Onset    Cancer Father 3        bladder    Diabetes Father     Heart attack Father      Social History:   5 grandkids (25mo-19y/o)  Non smoker   Walks routinely     Patient Active Problem List   Diagnosis    Gout synovitis    Viral cardiomyopathy    CHF (congestive heart failure)    Atrophic vaginitis    Iron deficiency anemia    Asymptomatic carotid artery stenosis without infarction, bilateral    CIED MRI Order Document 22 Sep 22 valid until 22 Sep 23    Type 2 diabetes mellitus with diabetic chronic kidney disease    Mitral valve regurgitation    History of left bundle branch block (LBBB)    History of blood transfusion    Nonischemic cardiomyopathy    History of TIA (transient ischemic attack)    History of cardiac arrest    History of DVT (deep vein thrombosis)    Carotid stenosis, right     REVIEW OF SYSTEMS: All other systems reviewed and negative except as above.   All other systems reviewed and negative except as above.   Gen/Constitutional: No fevers, no chills, no unintentional weight loss, + fatigue.  Eyes: No visual changes   ENT: no hearing loss, no nasal discharge, no epistaxis, no sore throat   Cdv: no chest pain, no palpitations  Resp: no cough, no shortness of breath, no wheezing  GI: no abdominal pain, no heartburn, no nausea/vomiting, no diarrhea, no constipation, no  hematochezia, no melena   GU:  no hematuria, no urinary frequency  Musculoskeletal: no joint pain, no joint swelling, no restricted motion, no peripheral edema, no varicosities   Skin: no rashes, no sores, no blisters  Neuro: no numbness, no tingling, no burning sensation  Psych: no nervousness, no anxiety, no depression  Endo: no heat/cold intolerance, no excessive thirst  Hem/Lymph: no bleeding (epistaxis, mucosal bleeding, hematemesis, hemoptysis, hematochezia, hematuria, melena), no easy bruising, no lymphadenopathy     PHYSICAL EXAMINATION  Vital Signs: BP 130/76 (BP Site: Right arm, Patient Position: Sitting, Cuff Size: Medium)   Pulse 74   Temp 97 F (36.1 C) (Temporal)   Wt 61.4 kg (135 lb 4.8 oz)   SpO2 99%   BMI 23.22 kg/m        General Appearance:  Alert, cooperative, no distress, appears stated age   Head: Normocephalic, without obvious abnormality, atraumatic   Eyes:  Sclera anicteric, conjunctiva without pallor   Lungs:    Abdomen:   Lung sounds CTA b/l     Non-distended   Musculoskeletal: No gait abnormalities    Skin: Normal skin color, texture, no bleeding or ecchymoses, no rashes or lesions   Extremities: No UE or LE edema   Neurologic:  Mental Status: Alert and oriented x3, face symmetric, tongue midline.  Normal mood and affect, cooperative     LABS/RADIOLOGY:     No visits with results within 1 Day(s) from this visit.   Latest known visit with results is:   Orders Only on 12/05/2021   Component Date Value Ref Range Status    Device Type 12/05/2021 CRT-D   Final    RV Pacing Percentage 12/05/2021 1.40   Final    AF Burden Percentage 12/05/2021 0.00   Final    ICD Shock Recent Count 12/05/2021 0   Final     LUE Doppler U/S 09/01/18: IMPRESSION:   Extensive occlusive and nonocclusive left upper extremity DVT in the  region of left pacer wire, as detailed. Per discussion with clinical  team at the time of dictation, there are aware of this finding.    RUE Doppler U/S 09/07/18: IMPRESSION:     Extensive right basilic occlusive thrombus in associated  thrombophlebitis. Continuous flow right cephalic vein. Other findings as  Above.      IMPRESSION/RECOMMENDATIONS: Ms. Deckard is a 76 y.o. female with   No diagnosis found.    1. Provoked DVT: LUE provoked dvt from pacer wires and acute illness. RUE DVT provoked in setting of line. She has completed 3 months therapeutic anticoagulation. Reviewed ultrasound reports from 01/10/19- no evidence of dvt but some minor chronic changes bilaterally. I am inclined to use prophylactic dosage anticoagulation going forward as opposed to stopping a/c completed as the ICD lead wire may very well be at risk /nidus for further recurrent clot formation-- though alternately, the lead would likely be endothelialized and perhaps pose less of a risk than prior. Patient is in agreement with continuation of low dose anticoagulation.   - s/p R CEA for high grade R ICA stenosis (80-99%) in Dec 2022, resumed ASA, statin and Eliquis at discharge.   - Cr stable (does have nephrology referral from PCP but has not seen)  - Cont Eliquis 2.5mg  BID (refill sent by cardiologist).     2. Iron defn anemia: history of IDA in setting of GI bleed. Resolved - iron studies were normal in Dec. Repeat levels pending today.     3. B12 deficiency: repeat B12 levels in Dec were normal.  Repeat levels pending today.     Pt instructed to send non urgent messages via MyChart and provided with [facility] clinic phone number. RTC 6 months.    Signed by:     Lajuana Carry, FNP-BC  HEMATOLOGY

## 2022-01-13 LAB — IRON PROFILE
Iron Saturation: 26 % (calc) (ref 16–45)
Iron: 89 ug/dL (ref 45–160)
TIBC: 349 mcg/dL (calc) (ref 250–450)

## 2022-03-05 DIAGNOSIS — Z95810 Presence of automatic (implantable) cardiac defibrillator: Secondary | ICD-10-CM

## 2022-03-05 DIAGNOSIS — I469 Cardiac arrest, cause unspecified: Secondary | ICD-10-CM

## 2022-03-07 ENCOUNTER — Other Ambulatory Visit (INDEPENDENT_AMBULATORY_CARE_PROVIDER_SITE_OTHER): Payer: Medicare Other | Admitting: Cardiovascular Disease

## 2022-03-17 ENCOUNTER — Telehealth (INDEPENDENT_AMBULATORY_CARE_PROVIDER_SITE_OTHER): Payer: Self-pay

## 2022-03-17 MED ORDER — ENTRESTO 49-51 MG PO TABS
1.0000 | ORAL_TABLET | Freq: Two times a day (BID) | ORAL | 0 refills | Status: DC
Start: 2022-03-17 — End: 2023-10-26

## 2022-03-17 NOTE — Telephone Encounter (Signed)
Pt calling re: entresto due for renewal. Pt is out of entresto x 5 days with next shipment due to be received on 03/25/22 (delayed from 03/15/22). Pt requesting short term refill to local confirmed pharmacy of choice. Refill fulfilled as requested. Pt has no further needs at this time.

## 2022-03-22 ENCOUNTER — Emergency Department: Payer: Medicare Other

## 2022-03-22 ENCOUNTER — Emergency Department
Admission: EM | Admit: 2022-03-22 | Discharge: 2022-03-22 | Disposition: A | Discharge status: Home / Self Care | Payer: Medicare Other | Attending: Emergency Medicine | Admitting: Emergency Medicine

## 2022-03-22 DIAGNOSIS — S0093XA Contusion of unspecified part of head, initial encounter: Secondary | ICD-10-CM | POA: Insufficient documentation

## 2022-03-22 DIAGNOSIS — W01198A Fall on same level from slipping, tripping and stumbling with subsequent striking against other object, initial encounter: Secondary | ICD-10-CM | POA: Insufficient documentation

## 2022-03-22 DIAGNOSIS — S8002XA Contusion of left knee, initial encounter: Secondary | ICD-10-CM | POA: Insufficient documentation

## 2022-03-22 DIAGNOSIS — S8000XS Contusion of unspecified knee, sequela: Secondary | ICD-10-CM

## 2022-03-22 MED ORDER — BACITRACIN +/- ZINC 500 UNIT/GM EX OINT (WRAP)
TOPICAL_OINTMENT | Freq: Once | CUTANEOUS | Status: AC
Start: 2022-03-22 — End: 2022-03-22
  Administered 2022-03-22: 1 g via TOPICAL
  Filled 2022-03-22 (×2): qty 1

## 2022-03-22 MED ORDER — TETANUS-DIPHTH-ACELL PERTUSSIS 5-2.5-18.5 LF-MCG/0.5 IM SUSP/SUSY (WR)
0.5000 mL | Freq: Once | INTRAMUSCULAR | Status: AC
Start: 2022-03-22 — End: 2022-03-22
  Administered 2022-03-22: 0.5 mL via INTRAMUSCULAR
  Filled 2022-03-22: qty 0.5

## 2022-03-29 ENCOUNTER — Encounter (INDEPENDENT_AMBULATORY_CARE_PROVIDER_SITE_OTHER): Payer: Self-pay

## 2022-03-29 NOTE — Progress Notes (Signed)
THIS FORM IS FOR INTERNAL USE FOR VASCULAR SURGERY AND IS NOT A PROGRESS NOTE    77 y.o. female   PCP: Carlye Grippe, MD    Appt Date/Time: 04/03/22 @ 11 AM                   Room #     Referring Physician:       Appointment Type: Follow-Up    Imaging: [place] Today- US Carotid Duplex Dopp Comp Bilateral @ 10:30 AM     Chief Complaint/HPI: 6 month follow up asymptomatic carotid stenosis and R CEA 02/22/2021 by Dr. Rob Bunting.   Hx DVT in the left upper extremity       Date of Last Visit:  09/19/21- Office visit w/ Dr.Bade     Allergies:   Allergies   Allergen Reactions    Shellfish-Derived Products Anaphylaxis         Selected Medications:  Aspirin, Eliquis, Metformin, Other diabetes medications, and Statins    Focused Prior Medical History: TIA, HTN, Diabetes, Kidney Disease, DVT, and CHF             Smoker           Former Smoker     X     Never Smoker      Vital Signs this Visit Pulses  HT  BP R BP L Temp   Rad Ulnar Brach Fem Pop DP PT        R          WT  Pulse R Pulse L SpO2       L          Imaging results:        Plan of Care:

## 2022-03-29 NOTE — Progress Notes (Deleted)
[facility]    No chief complaint on file.    History of Present Illness   Tara Perry is a 76 y.o. female with a history of asymptomatic carotid stenosis and R CEA 02/22/2021 by Dr. Rob Bunting. She presents in 57-monthfollow-up with carotid duplex performed in our vascular lab today.    Patient denies any recent symptoms of lateralizing neurologic ischemia including stroke, TIA, monocular visual changes, or amaurosis fugax. *** She denies any other vascular concerns; she denies claudication, rest pain, or tissue loss. ***    She has history of VF cardiac arrest in June 2020 with ICD placement in left upper chest.  Other past medical history includes diabetes mellitus, gout and cardiomyopathy. She has history of DVT in the left upper extremity after its placement and has been on Eliquis therapy, managed by hematology.    Pre-operative R CEA Imaging:  She was followed closely with carotid duplex. At her office visit in October 2021, duplex demonstrated increased peak systolic velocity in the right internal carotid artery to 254 cm/s.  This is increased from 236 cm/s in the duplex performed in 05/07/2019. Additionally the ICA to CCA ratio has increased to 2.9, from 2.5 in 04/2019.  She has known CKD stage III with recent GFR of 31 and therefore CTA was not recommended.  MRA was ordered but completion was delayed due to her severe claustrophobia. MRA performed on 12/14/2020 demonstrated extensive atherosclerotic plaque right carotid bifurcation resulting in approximately 80-90% stenosis of the proximal right internal carotid artery. Moderate atherosclerotic plaque at the left carotid bifurcation resulting in approximately 60-70% stenosis.     She is compliant with Aspirin 87m Atorvastatin 4016mand Eliquis 2.5mg27mD.  Past Medical History     Past Medical History:   Diagnosis Date    Anemia 08/2018    Atrophic vaginitis 08/30/2018    Bilateral cataracts     I have implants    Cardiac arrest 08/17/2018    VF  arrest. She received 3 shocks, 1 round of epi with successful ROSC. 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/2018    per patient, he only sees peripheral view    Diabetes mellitus 1992    Encounter for blood transfusion     unsure of dates    Gout spondylitis     Gout synovitis     Hypertension     LBBB (left bundle branch block)     LBBB chronic    NICM (nonischemic cardiomyopathy) 08/2018    NICM, EF 20%    Renal insufficiency     Respiratory failure 08/2018    Hypoxic/anoxic respiratory failure, extubated 08/20/2018    Type 2 diabetes mellitus, controlled     Viral cardiomyopathy 2008     Past Surgical History:   Procedure Laterality Date    COLONOSCOPY  2002    normal    EGD N/A 09/01/2018    Procedure: EGD;  Surgeon: Kim,Jacqualyn Posey;  [facility]: [id number];  Service: Gastroenterology;  Laterality: N/A;    ENDARTERECTOMY, CAROTID ARTERY Right 02/22/2021    Procedure: RIGHT CAROTID ARTERY ENDARTERECTOMY WITH NEUROMONITORING;  Surgeon: BadeJettie Booze;  Location: [place] [facility];  Service: Cardiovascular;  Laterality: Right;    EXPLORATORY LAPAROTOMY      EYE SURGERY      ICD IMPLANT BIV  N/A 08/26/2018    Procedure: ICD IMPLANT BIV;  Surgeon: Kathryne Sharper, MD;  Location: [facility];  Service: Cardiovascular;  Laterality: N/A;  medtronic    INSERT / REPLACE / REMOVE PACEMAKER      Difibrillator August 26, 2018    LHC W/ CORONARY ANGIOS AND LV Left 08/21/2018    Procedure: LHC W/ CORONARY ANGIOS AND LV;  Surgeon: Wandra Arthurs, MD;  Location: [facility];  Service: Cardiovascular;  Laterality: Left;    SMALL INTESTINE SURGERY  01/2013     Allergies     Allergies   Allergen Reactions    Shellfish-Derived Products Anaphylaxis     Medications       Current Outpatient Medications:     apixaban (Eliquis) 2.5 MG, Take 1 tablet (2.5 mg) by mouth every 12 (twelve) hours, Disp: 180 tablet, Rfl:  3    aspirin EC 81 MG EC tablet, Take 1 tablet (81 mg) by mouth daily, Disp: , Rfl:     atorvastatin (LIPITOR) 40 MG tablet, Take 2 tablets (80 mg) by mouth nightly, Disp: 180 tablet, Rfl: 3    carvedilol (Coreg) 12.5 MG tablet, Take 1 tablet (12.5 mg) by mouth 2 (two) times daily with meals, Disp: 180 tablet, Rfl: 3    glimepiride (AMARYL) 2 MG tablet, Take 1 tablet (2 mg) by mouth daily, Disp: , Rfl:     Jardiance 25 MG tablet, 1 tablet (25 mg) every morning, Disp: , Rfl:     metFORMIN (GLUCOPHAGE) 500 MG tablet, Take 1 tablet (500 mg total) by mouth 2 (two) times daily Morning and night (Patient taking differently: Take 1 tablet (500 mg) by mouth 2 (two) times daily with meals), Disp: , Rfl:     Multiple Vitamins-Minerals (CENTRUM PO), Take by mouth daily, Disp: , Rfl:     sacubitril-valsartan (Entresto) 49-51 MG Tab per tablet, Take 1 tablet by mouth 2 (two) times daily, Disp: 180 tablet, Rfl: 3    sacubitril-valsartan (Entresto) 49-51 MG Tab per tablet, Take 1 tablet by mouth 2 (two) times daily, Disp: 28 tablet, Rfl: 0    vitamin B-12 (CYANOCOBALAMIN) 100 MCG tablet, Take 1 tablet (100 mcg) by mouth daily, Disp: , Rfl:     Family History   Problem Relation Age of Onset    Cancer Father 36        bladder    Diabetes Father     Heart attack Father      Social History     Socioeconomic History    Marital status: Widowed   Tobacco Use    Smoking status: Never    Smokeless tobacco: Never   Vaping Use    Vaping Use: Never used   Substance and Sexual Activity    Alcohol use: Not Currently     Alcohol/week: 1.0 standard drink of alcohol     Types: 1 Glasses of wine per week     Comment: rarely    Drug use: No    Sexual activity: Not Currently     Physical Exam     There were no vitals filed for this visit.    There is no height or weight on file to calculate BMI.    Physical Exam  Vitals reviewed.   Constitutional:       General: She is not in acute distress.     Appearance: Normal appearance.   HENT:      Head:  Normocephalic and atraumatic.   Cardiovascular:  Rate and Rhythm: Normal rate and regular rhythm.      Pulses:           Carotid pulses are 2+ on the right side with bruit and 2+ on the left side with bruit.       Radial pulses are 2+ on the right side and 2+ on the left side.        Dorsalis pedis pulses are 1+ on the right side and 1+ on the left side.        Posterior tibial pulses are 1+ on the right side and 1+ on the left side.      Heart sounds: Normal heart sounds.   Pulmonary:      Effort: Pulmonary effort is normal.      Breath sounds: Normal breath sounds.   Skin:     General: Skin is warm and dry.      Comments: L neck incision well-healed   Neurological:      General: No focal deficit present.      Mental Status: She is alert and oriented to person, place, and time.   Psychiatric:         Attention and Perception: Attention normal.         Mood and Affect: Mood and affect normal.         Speech: Speech normal.         Behavior: Behavior normal. Behavior is cooperative.         Thought Content: Thought content normal.        Imaging   04/03/2022 Carotid Duplex performed in our vascular lab:   ***    09/19/2021 Carotid Duplex performed in our vascular lab:   1. The endarterectomized right carotid bifurcation appear patent with elevated velocities. There is evidence of minimal residual disease in the right internal carotid artery. Hx of CEA.   2. Luminal stenosis of the left internal carotid artery is estimated to be in the 1-49% range.   3. Antegrade blood flow is present in both vertebral arteries.   4. Patent bilateral subclavian arteries.     03/21/2021 Carotid Duplex performed in our vascular lab:  1. The extracranial carotid arteries, proximal vertebral and proximal subclavian arteries were evaluated with high resolution imaging, color Doppler, and spectral Doppler techniques. Findings are as follows:   2. Widely patent right carotid endarterectomy.   3. There is some intimal thickening  throughout the left carotid system. The peak systolic velocities and the small amount of heterogeneous plaque formation at the left carotid bulb, which extends to the origin of the internal carotid artery. This appears to be creating a 50-69% stenosis.   4. The external carotid arteries are widely patent.   5. Antegrade flow was in the vertebral arteries bilaterally.   6. The subclavian arteries are widely patent.     Results reviewed with patient in the office.  Assessment and Plan   Tara Perry is a 77 y.o. female with a history of asymptomatic carotid stenosis and R CEA 12/13/202 by Dr. Rob Bunting. She remains asymptomatic. Carotid duplex performed today demonstrates residual R ICA disease with PSV of 169 cm/sec and EDV of 49 cm/sec resulting in a 50-69% stenosis of the R ICA based on velocities, with L ICA stenosis of 1-49%. 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    - Continue Aspirin and Atorvastatin for medical management of  arterial disease.  Continue Eliquis as prescribed by hematology.    - Call 911 and proceed directly to the closest emergency department for any s/s of lateralizing neurologic ischemia, stroke, TIA, or amaurosis fugax.     - Recommend follow-up in 55-month with carotid duplex.         ***

## 2022-04-03 ENCOUNTER — Other Ambulatory Visit (INDEPENDENT_AMBULATORY_CARE_PROVIDER_SITE_OTHER): Payer: Medicare Other

## 2022-04-03 ENCOUNTER — Ambulatory Visit (INDEPENDENT_AMBULATORY_CARE_PROVIDER_SITE_OTHER): Payer: Medicare Other

## 2022-04-03 ENCOUNTER — Ambulatory Visit (INDEPENDENT_AMBULATORY_CARE_PROVIDER_SITE_OTHER): Payer: Medicare Other | Admitting: Specialist

## 2022-04-03 NOTE — ED Provider Notes (Signed)
[facility] EMERGENCY DEPARTMENT  ATTENDING PHYSICIAN HISTORY AND PHYSICAL EXAM     Patient Name: Tara Perry, Tara Perry  Encounter Date:  03/22/2022  Attending Physician: Val Eagle, MD  Room:  4/M 4  Patient DOB:  [birth year]  Age: 77 y.o. female  MRN:  [id number]  PCP: Carlye Grippe, MD     History of Presenting Illness     Tara Perry is a 77 y.o. female presenting to the ED for head contusion.  Patient states that she had a mechanical fall earlier today whereby she fell and hit the right side of her forehead.  She states that she tripped accidentally over a rug.  Patient denies any loss of consciousness.  At this time she denies any severe headache dizziness or lightheadedness.        Physical Exam     General appearance: Patient appears stated age.  HEENT: The head is normocephalic and atraumatic, without tenderness, visible, or palpable masses. The eyes are normal in appearance without swelling or lesions. Sclera anicteric.The external ears are normal in appearance without swelling or lesions. The nasal mucosa is pink and moist. The neck is supple without adenopathy. The oral mucosa is pink and moist.   Pulmonary: The chest wall is symmetric and without deformity. Lung sounds are clear to ascultation bilaterally without wheezing, rales, or rhonchi.   Cardiovascular: The external chest is normal in appearance and without deformity. Chest wall is non-tender. Heart rate and rhythm auscultated. No murmurs appreciated.  Abdominal: The abdomin is soft, non-tender, non-distended without rebound or guarding. No masses. No organomegaly  GU: No CVA tenderness is appreciated.  Musculoskeletal: The upper and lower extremities are atraumatic in appearance without tenderness or deformity.   Neurologic: Alert and oriented to person, place, and time. Language is fluent. GCS 15. Pupils are equal round and reactive to light. Face is symmetrical at rest and with activation. Speech normal. No pronator drift. No dysmetria on finger  to nose. Equal and appropriate strength and sensation in all extremities. Normal gait observed.   Psychiatric: Appropriate mood and affect.  Skin: Color normal for ethnicity. No rashes appreciated.        Medical Decision Making        Initial Patient Evaluation:    I evaluated this patient in the emergency department. I reviewed and independently interpreted all vital signs, nursing, and triage notes for this patient encounter. Previous external notes were reviewed. I also obtained and reviewed the patient's current medications, past medical, surgical, family, and social history, and medication allergies.     Differential Diagnosis:    After obtaining history and physical exam, I evaluated the patient for mild head trauma.  I have low clinical suspicion for severe intracranial pathology such as intravascular hemorrhage or epidural hematoma as patient has no focal neurologic deficits on extended neurologic exam.    Clinical Course:    The patient presented to the emergency department with mild head trauma following a mechanical fall.  An abundance of caution I did obtain imaging on this patient that showed no evidence of severe intracerebral pathology.  After brief stay in emergency department, patient states that she is feeling much better.  Advised her to follow-up with her primary care doctor this week and return emergency department should her symptoms worsen.        Interpretations     Pulse Oximetry independently interpreted by me: 98% on room air, normal    Imaging independently interpreted by me: CAT scan of the  head reveals no acute intracranial pathology.       Procedures & Critical Care            Diagnosis & Disposition     Final diagnoses:   Contusion of head, initial encounter   Contusion of knee, unspecified laterality, sequela       ED Disposition       ED Disposition   Discharge    Condition   --    Date/Time   Wed Mar 22, 2022 11:55 AM    Comment   Abran Richard discharge to home/self  care.    Condition at disposition: Stable                    Attestations     Val Eagle, MD          Othella Boyer, MD  04/03/22 [phone]

## 2022-04-11 ENCOUNTER — Encounter (INDEPENDENT_AMBULATORY_CARE_PROVIDER_SITE_OTHER): Payer: Self-pay

## 2022-04-11 NOTE — Progress Notes (Signed)
THIS FORM IS FOR INTERNAL USE FOR VASCULAR SURGERY AND IS NOT A PROGRESS NOTE      Room:         Appt Date/Time: 04/17/22 at 11:30 AM    77 y.o. female   PCP: Carlye Grippe, MD  Referring Physician:        Appointment Type: Follow-Up  Imaging: [place] Today---->carotid duplex @ 11:00 AM    Chief Complaint/HPI: 6 month f/u asymptomatic carotid stenosis, surveillance of R CEA on 02/22/2021 by Dr. Rob Bunting  -Hx DVT in the left upper extremity      Date of Last Visit:  09/19/21 (office visit with Dr. Montine Circle, [place])    Allergies:   Allergies   Allergen Reactions    Shellfish-Derived Products Anaphylaxis         Selected Medications:  Aspirin, Eliquis, Metformin, Other diabetes medications, and Statins    Focused Prior Medical History: CHF, Stroke, HTN, Diabetes, Kidney Disease, and DVT             Smoker           Former Smoker     X     Never Smoker      Vital Signs this Visit Pulses  HT  BP R BP L Temp   Rad Ulnar Brach Fem Pop DP PT        R          WT  Pulse R Pulse L SpO2       L          Imaging results:        Plan of Care:

## 2022-04-17 ENCOUNTER — Encounter (INDEPENDENT_AMBULATORY_CARE_PROVIDER_SITE_OTHER): Payer: Self-pay | Admitting: Acute Care

## 2022-04-17 ENCOUNTER — Ambulatory Visit (INDEPENDENT_AMBULATORY_CARE_PROVIDER_SITE_OTHER): Payer: Medicare Other

## 2022-04-17 ENCOUNTER — Ambulatory Visit (INDEPENDENT_AMBULATORY_CARE_PROVIDER_SITE_OTHER): Payer: Medicare Other | Admitting: Acute Care

## 2022-04-17 VITALS — BP 120/70 | HR 67 | Ht 64.0 in | Wt 131.0 lb

## 2022-04-17 DIAGNOSIS — I6523 Occlusion and stenosis of bilateral carotid arteries: Secondary | ICD-10-CM

## 2022-04-17 NOTE — Progress Notes (Signed)
[facility]    Chief Complaint   Patient presents with    Follow-up      6 month f/u asymptomatic carotid stenosis, surveillance of R CEA on 02/22/2021 by Dr. Rob Bunting  -Hx DVT in the left upper extremity            History of Present Illness   Tara Perry is a 77 y.o. female with a history of asymptomatic carotid stenosis and R CEA 02/22/2021 by Dr. Rob Bunting. She presents in 39-month follow-up with carotid duplex performed in our vascular lab today.    Today, she reports she has been well in the interval period and without complaints. She denies any recent symptoms of lateralizing neurologic ischemia including stroke, TIA, monocular visual changes, or amaurosis fugax.     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  Other past medical history includes diabetes mellitus, gout and cardiomyopathy. She has history of DVT in the left upper extremity after its placement and has been on Eliquis therapy, managed by hematology.    She is compliant with Aspirin 81mg , Atorvastatin 40mg , and Eliquis 2.5mg  BID.    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 She received 3 shocks, 1 round of epi with successful ROSC. 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/2018    per patient, he only sees peripheral view    Diabetes mellitus 1992    Encounter for blood transfusion     unsure of dates    Gout spondylitis     Gout synovitis     Hypertension     LBBB (left bundle branch block)     LBBB chronic    NICM (nonischemic cardiomyopathy) 08/2018    NICM, EF 20%    Renal insufficiency     Respiratory failure 08/2018    Hypoxic/anoxic respiratory failure, extubated  08/20/2018    Type 2 diabetes mellitus, controlled     Viral cardiomyopathy 2008     Past Surgical History:   Procedure Laterality Date    COLONOSCOPY  2002    normal    EGD N/A 09/01/2018    Procedure: EGD;  Surgeon: Jacqualyn Posey, MD;  Location: [facility];  Service: Gastroenterology;  Laterality: N/A;    ENDARTERECTOMY, CAROTID ARTERY Right 02/22/2021    Procedure: RIGHT CAROTID ARTERY ENDARTERECTOMY WITH NEUROMONITORING;  Surgeon: Jettie Booze, MD;  Location: [facility];  Service: Cardiovascular;  Laterality: Right;    EXPLORATORY LAPAROTOMY      EYE SURGERY      ICD IMPLANT BIV N/A 08/26/2018    Procedure: ICD IMPLANT BIV;  Surgeon: Kathryne Sharper, MD;  Location: [facility];  Service: Cardiovascular;  Laterality: N/A;  medtronic    INSERT / REPLACE / REMOVE PACEMAKER      Difibrillator August 26, 2018    LHC W/ CORONARY ANGIOS AND LV Left 08/21/2018    Procedure: LHC W/ CORONARY ANGIOS AND LV;  Surgeon: Wandra Arthurs, MD;  Location: [facility];  Service: Cardiovascular;  Laterality: Left;    SMALL INTESTINE SURGERY  01/2013     Allergies     Allergies   Allergen Reactions    Shellfish-Derived Products Anaphylaxis     Medications       Current Outpatient Medications:     apixaban (Eliquis) 2.5 MG, Take 1 tablet (2.5 mg) by mouth every 12 (twelve) hours, Disp: 180 tablet, Rfl: 3    aspirin EC 81 MG EC tablet, Take 1 tablet (81 mg) by mouth daily, Disp: , Rfl:     atorvastatin (LIPITOR) 40 MG tablet, Take 2 tablets (80 mg) by mouth nightly, Disp: 180 tablet, Rfl: 3    carvedilol (Coreg) 12.5 MG tablet, Take 1 tablet (12.5 mg) by mouth 2 (two) times daily with meals, Disp: 180 tablet, Rfl: 3    glimepiride (AMARYL) 2 MG tablet, Take 1 tablet (2 mg) by mouth daily, Disp: , Rfl:     Jardiance 25 MG tablet, 1 tablet (25 mg) every morning, Disp: , Rfl:     metFORMIN (GLUCOPHAGE) 500 MG tablet, Take 1 tablet (500 mg total) by mouth 2 (two) times daily Morning and night (Patient taking differently: Take  1 tablet (500 mg) by mouth 2 (two) times daily with meals), Disp: , Rfl:     Multiple Vitamins-Minerals (CENTRUM PO), Take by mouth daily, Disp: , Rfl:     sacubitril-valsartan (Entresto) 49-51 MG Tab per tablet, Take 1 tablet by mouth 2 (two) times daily, Disp: 180 tablet, Rfl: 3    sacubitril-valsartan (Entresto) 49-51 MG Tab per tablet, Take 1 tablet by mouth 2 (two) times daily, Disp: 28 tablet, Rfl: 0    vitamin B-12 (CYANOCOBALAMIN) 100 MCG tablet, Take 1 tablet (100 mcg) by mouth daily, Disp: , Rfl:     Family History   Problem Relation Age of Onset    Cancer Father 34        bladder    Diabetes Father     Heart attack Father      Social History     Socioeconomic History    Marital status: Widowed   Tobacco Use    Smoking status: Never    Smokeless tobacco: Never   Vaping Use    Vaping Use: Never used   Substance and Sexual Activity    Alcohol use: Not Currently     Alcohol/week: 1.0 standard drink of alcohol     Types: 1 Glasses of wine per week     Comment: rarely    Drug use: No    Sexual activity: Not Currently     Physical Exam     Vitals:    04/17/22 1137 04/17/22 1139   BP: 108/68 120/70   BP Site: Right arm Left arm   Patient Position: Sitting Sitting   Cuff Size: Medium Medium   Pulse:  67   SpO2: 98%    Weight: 59.4 kg (131 lb)    Height: 1.626 m (5\' 4" )      Body mass index is 22.49 kg/m.    Physical Exam  Vitals reviewed.   Constitutional:       General: She is not in acute distress.     Appearance: Normal appearance.   HENT:      Head: Normocephalic and atraumatic.   Cardiovascular:      Rate and Rhythm: Normal rate and regular rhythm.      Pulses:           Carotid pulses are 2+ on the right side  and 2+ on the left side.       Radial pulses are 2+ on the right side and 2+ on the left side.      Heart sounds: Normal heart sounds.   Pulmonary:      Effort: Pulmonary effort is normal.      Breath sounds: Normal breath sounds.   Skin:     General: Skin is warm and dry.      Comments: L neck  incision well-healed   Neurological:      General: No focal deficit present.      Mental Status: She is alert and oriented to person, place, and time.   Psychiatric:         Attention and Perception: Attention normal.         Mood and Affect: Mood and affect normal.         Speech: Speech normal.         Behavior: Behavior normal. Behavior is cooperative.         Thought Content: Thought content normal.        Imaging   04/17/2022 Carotid Duplex performed in our vascular lab:   1. Bilateral common, internal and left external carotid arteries were visualized and appeared patent bilaterally. The vertebral and subclavian arteries were visualized and appeared patent. Antegrade flow was noted in the vertebral artery.   2. No evidence of hemodynamically significant disease right s/p carotid endarterectomy. The right proximal ECA was occluded.   3. Heterogenous calcified plaque was seen at left bulb, and proximal ICA. 50-69% stenosis in the left ICA.     Results reviewed with patient in the office.  Assessment and Plan   77 y/o female with a history of R CEA in 02/2021 performed by Dr. Rob Bunting. Patient has been well in the interval period and denies any current symptoms of lateralizing neurologic ischemia or any history of TIA, CVA or amaurosis fugax. Carotid duplex demonstrates a patent R CEA site without evidence of stenosis or development of intimal hyperplasia. The L ICA appears to have a 50-69% stenosis which has progressed from her prior visit. There is also a new finding of a right external carotid artery occlusion.     - Discussed with patient and son, R ECA occlusion does not pose a risk of stroke as it supplies the face and not the brain.   - Continue aspirin and statin daily  - We will continue to monitor every 6 months to assess for any progression of disease on the left  - Educated on s/s of TIA/CVA and to notify our office/present to ED for evaluation if these occur        I hope this plan of care meets with your  approval. Thank you for the opportunity to care for your patient.     Sincerely,     Deborra Medina, [place], AGACNP-BC  [facility]  [address]  [place], [place] [address]  T [place] F [phone]

## 2022-04-28 ENCOUNTER — Ambulatory Visit (INDEPENDENT_AMBULATORY_CARE_PROVIDER_SITE_OTHER): Payer: Medicare Other | Admitting: Cardiovascular Disease

## 2022-04-28 ENCOUNTER — Encounter (INDEPENDENT_AMBULATORY_CARE_PROVIDER_SITE_OTHER): Payer: Self-pay | Admitting: Cardiovascular Disease

## 2022-04-28 DIAGNOSIS — I5042 Chronic combined systolic (congestive) and diastolic (congestive) heart failure: Secondary | ICD-10-CM

## 2022-04-28 NOTE — Progress Notes (Signed)
[facility] PROGRESS NOTE    HRT [facility] OFFICE      [facility] OFF[facility] -CARDIOLOGY  [place] [address]  Dept: [phone]  Dept Fax: [phone]       Patient Name: Tara Perry    Date of Visit:  April 28, 2022  Date of Birth: [birth year]  AGE: 77 y.o.  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 female who presents today for management of chronic systolic CHF, prior VF arrest, and carotid disease.  Since her last cardiology office visit in August 2023, she had a ICD interrogated 12/02/2021, showing normal battery, lead, and device performance.  She saw hematology 01/11/2022 for provoked DVT and was recommended to continue Eliquis.    She was seen in the ER 03/22/2022 for head contusion after mechanical fall and has done fine with a full recovery.  She saw vascular surgery 04/17/2022 at which time it was noted she had a 73 to 69% left ICA that had progressed compared to her prior study as well as a patent right carotid endarterectomy site without evidence of intimal hyperplasia.  She also had a new finding of a right external carotid artery occlusion.  Continued medical management was recommended    She has been doing otherwise well from a cardiac standpoint, denies any paroxysmal nocturnal dyspnea, orthopnea, leg edema, exercise intolerance, or chest pain.  She has not had any heart failure hospitalizations.  Tolerating her medications well.      PAST MEDICAL HISTORY: She has a past medical history of Anemia (08/2018), Atrophic vaginitis (08/30/2018), Bilateral cataracts, Cardiac arrest (08/17/2018), Chronic vulvitis (08/30/2018), CKD (chronic kidney disease), stage III, Congestive heart failure, Detached retina, right (08/24/2018), Diabetes mellitus (1992),  Encounter for blood transfusion, Gout spondylitis, Gout synovitis, Hypertension, LBBB (left bundle branch block), NICM (nonischemic cardiomyopathy) (08/2018), Renal insufficiency, Respiratory failure (08/2018), Type 2 diabetes mellitus, controlled, and Viral cardiomyopathy (2008). She has a past surgical history that includes Colonoscopy (2002); Exploratory laparotomy; LHC w/ Coronary Angios and LV (Left, 08/21/2018); ICD Implant BiV (N/A, 08/26/2018); EGD (N/A, 09/01/2018); Eye surgery; Small intestine surgery (01/2013); Insert / replace / remove pacemaker; and ENDARTERECTOMY, CAROTID ARTERY (Right, 02/22/2021).    ALLERGIES:   Allergies   Allergen Reactions    Shellfish-Derived Products Anaphylaxis       MEDICATIONS:   Current Outpatient Medications:     apixaban (Eliquis) 2.5 MG, Take 1 tablet (2.5 mg) by mouth every 12 (twelve) hours, Disp: 180 tablet, Rfl: 3    aspirin EC 81 MG EC tablet, Take 1 tablet (81 mg) by mouth daily, Disp: , Rfl:     atorvastatin (LIPITOR) 40 MG tablet, Take 2 tablets (80 mg) by mouth nightly, Disp: 180 tablet, Rfl: 3    carvedilol (Coreg) 12.5 MG tablet, Take 1 tablet (12.5 mg) by mouth 2 (two) times daily with meals, Disp: 180 tablet, Rfl: 3    glimepiride (AMARYL) 2 MG tablet, Take 2 tablets (4 mg) by mouth daily, Disp: , Rfl:     Jardiance 25 MG tablet, 1 tablet (25 mg) every morning, Disp: , Rfl:     metFORMIN (GLUCOPHAGE) 500 MG tablet, Take 1 tablet (500 mg total) by mouth 2 (two) times daily Morning and night (Patient taking  differently: Take 1 tablet (500 mg) by mouth 2 (two) times daily with meals), Disp: , Rfl:     Multiple Vitamins-Minerals (CENTRUM PO), Take by mouth daily, Disp: , Rfl:     sacubitril-valsartan (Entresto) 49-51 MG Tab per tablet, Take 1 tablet by mouth 2 (two) times daily, Disp: 180 tablet, Rfl: 3    sacubitril-valsartan (Entresto) 49-51 MG Tab per tablet, Take 1 tablet by mouth 2 (two) times daily, Disp: 28 tablet, Rfl: 0    vitamin B-12 (CYANOCOBALAMIN)  100 MCG tablet, Take 1 tablet (100 mcg) by mouth daily, Disp: , Rfl:      FAMILY HISTORY: family history includes Cancer (age of onset: 69) in her father; Diabetes in her father; Heart attack in her father.    SOCIAL HISTORY: She reports that she has never smoked. She has never used smokeless tobacco. She reports that she does not currently use alcohol after a past usage of about 1.0 standard drink of alcohol per week. She reports that she does not use drugs.    PHYSICAL EXAMINATION    Visit Vitals  BP 118/70 (BP Site: Left arm, Patient Position: Sitting, Cuff Size: Medium)   Pulse 60   Wt 58.1 kg (128 lb)   BMI 21.97 kg/m       General Appearance:  A well-appearing female in no acute distress.    Skin: Warm and dry to touch   Head: Normocephalic   Eyes: EOMS Intact   [id number]  Neck: JVP normal, no carotid bruit    Chest: Clear to auscultation bilaterally   Cardiovascular: Regular rhythm no murm   Abdomen: no guarding  Extremities: Warm without edema.   Neuro: Alert and oriented x3.        ECG: Not done today      LABS:   Lab Results   Component Value Date    WBC 8.92 01/11/2022    HGB 13.1 01/11/2022    HCT 39.8 01/11/2022    PLT 224 01/11/2022     Lab Results   Component Value Date    GLU 223 (H) 01/11/2022    BUN 31.0 (H) 01/11/2022    CREAT 1.5 (H) 01/11/2022    NA 139 01/11/2022    K 4.6 01/11/2022    CL 108 01/11/2022    CO2 22 01/11/2022    AST 21 01/11/2022    ALT 16 01/11/2022     Lab Results   Component Value Date    MG 2.0 02/23/2021    TSH 1.06 09/13/2012    HGBA1C 8.7 (H) 02/16/2021    BNP 139 (H) 08/16/2018     Lab Results   Component Value Date    CHOL 161 10/23/2018    TRIG 223 (H) 10/23/2018    HDL 42 10/23/2018    LDL 74 10/23/2018          IMPRESSION:   [name] is a 77 y.o. female with the following problems:     Chronic systolic heart failure, NYHA class II, euvolemic and well compensated  Echocardiogram 08/19/2018: EF 20%, dyskinesis of the basal/mid anterior, anteroseptal, and inferoseptal  LV, moderate hypokinesis of the basal inferior wall, normal RV function, down from 40% on October 2017 echo  Echocardiogram 11/05/2018: EF 40%  Nonischemic cardiomyopathy, s/p cardiac catheterization 08/21/2018 with no CAD  Prior intolerance to spironolactone/MRAs  Prior ventricular fibrillation arrest, witnessed, hospitalized 08/16/2018 through 08/27/2018, successfully resuscitated by EMS, intubated, underwent successful therapeutic hypothermia protocol then extubated, s/p biventricular AICD placed 08/26/2018  Prior left upper extremity DVT requiring hospitalization 08/30/2018 through 09/08/2018 with anticoagulation at that time complicated by hematemesis requiring endoscopy showing nonbleeding duodenal ulcer treated with epinephrine and cautery/clipping  Hypertension, controlled   Hypercholesterolemia, on statin therapy  Chronic left bundle branch block  Type 2 diabetes  Chronic kidney disease  Bilateral carotid artery disease  Carotid duplex study 03/08/2018: Bilateral 50 to 69% ICA disease  Carotid duplex 05/07/2019: 70-99% right ICA, 50 to 69% left ICA  Possible symptomatic TIA 05/19/2019 (left facial numbness leading to ER visit)  MRA 06/16/2019: 75% right ICA, 40% left ICA  Carotid duplex 12/15/2019: 70-99% right ICA, 50 to 69% left ICA        RECOMMENDATIONS:     Doing well from the standpoint of her prior history of heart failure, euvolemic on exam with no evidence of volume overload  She is due to get lipids done through her primary, we will look out for those as she needs aggressive lipid reduction with her carotid disease, with a goal LDL of well under 70 if not in the 50s  She potentially is a candidate for the [place] trial of an oral PCSK9 inhibitor, would be a good option for her to allow more aggressive risk factor modification and monitoring of her lipids given her significant carotid disease.  Will have our research coordinators to reach out to her.                                                     Orders  Placed This Encounter   Procedures    Echo Transthoracic Adult Complete    Office Visit (HRT [place])    Research Visit (HRT [place])       No orders of the defined types were placed in this encounter.      SIGNED:    Arcelia Jew, MD          This note was generated by the Dragon speech recognition and may contain errors or omissions not intended by the user. Grammatical errors, random word insertions, deletions, pronoun errors, and incomplete sentences are occasional consequences of this technology due to software limitations. Not all errors are caught or corrected. If there are questions or concerns about the content of this note or information contained within the body of this dictation, they should be addressed directly with the author for clarification.

## 2022-05-01 ENCOUNTER — Encounter (INDEPENDENT_AMBULATORY_CARE_PROVIDER_SITE_OTHER): Payer: Self-pay

## 2022-05-01 NOTE — Progress Notes (Signed)
Message left on Mrs. Losey's voicemail regarding the [employer] clinical research trial which she was referred to by Dr. Vena Rua.  Please contact the research department at [phone] with any questions.

## 2022-05-02 ENCOUNTER — Encounter (INDEPENDENT_AMBULATORY_CARE_PROVIDER_SITE_OTHER): Payer: Self-pay

## 2022-05-02 NOTE — Progress Notes (Incomplete)
Spoke to Tara Perry today regarding the [facility] clinical research trial, at the request of Dr. Vena Rua.  Patient was provided with an overview of the study to include study design, information regarding clinical research trial and frequency of visits.  All questions answered to the patient's satisfaction at the time of discussion. Since patient does not have a rec Patient was very interested in the trial and scheduled a Screening Visit for ****.  A copy of the Informed Consent was emailed to the patient.  Please contact the research department at [phone] with any questions.

## 2022-05-02 NOTE — Progress Notes (Signed)
Spoke to [name] today regarding the [facility] clinical research trial, at the request of Dr. Vena Rua.  Patient was provided with an overview of the study to include study design, information regarding clinical research trial and frequency of visits. Since patient does not have a recent historical (within 3 months) LDL  and the last LDL on file is from 2020 which was 74 mg/dL, it was discussed that the patient could come in for a limited screening visit, where we would draw a LDL to check eligibility. Based on the patient being high risk for first ASCVD event, per the protocol the LDL would need to be at least 90 mg/dL.  Patient stated she was getting her labs drawn in a few weeks by her PCP and would like to wait to check those results before scheduling the screening. With patient's consent a copy of the Informed Consent was emailed to the patient for review. Research department will follow up with the patient in a few weeks to discuss lab results ordered by the PCP.  All questions answered to the patient's satisfaction at the time of discussion.   Please contact the research department at [phone] with any questions.     "Hi Tara Perry,    It was great speaking with you today regarding the [facility] Cholesterol lowering research trial.  The name of this research study in which Dr. Vena Rua referred you is [facility].    As we discussed, the [place] research trial is sponsored by [employer] and is being conducted to learn if the oral investigational medication [id number] reduces cardiovascular events in patients that have high cholesterol and are at high risk for cardiovascular events.  This is a phase 3 study, and you have a 50/50 chance of receiving [id number] or placebo.  The study is expected to last up to 6 years and will consist of approximately 7 visits the first year (every 13 weeks) and approximately 2 visits (every 6 months) and 2 telephone calls the remaining years.  The study drug, which is a PCSK9  inhibitor, has been shown to reduce LDL by 50% in previous phase 2 studies. The medication will be taken once a day in the morning, following an 8 hour fast. The study medication and visits are provided at no cost to you or your insurance.  The study will compensate you for your time as outlined in the attached Informed Consent. We are not taking away any medications you are currently taking, just adding to them.  The visits will take place at the [facility], [place] location.  The address is:    [address]  [place], [place]   [address]    I have attached a copy of the Informed Consent Form to this email. This is just for your review. You don't have to send anything back to me.  As we discussed, you prefer to wait until you have your lipids checked by your PCP.  Another alternative would be to come in and have just the LDL checked as part of the research trial. I will follow up with you in a few weeks to see what your blood work showed.   Please let me know if you have any questions.     Thank you,  Donata Duff  Clinical Research Coordinator   [phone]   [place], [place]  [address]  Phone: [phone]"

## 2022-06-04 DIAGNOSIS — Z95810 Presence of automatic (implantable) cardiac defibrillator: Secondary | ICD-10-CM

## 2022-06-04 DIAGNOSIS — I469 Cardiac arrest, cause unspecified: Secondary | ICD-10-CM

## 2022-06-04 LAB — REMOTE CARDIAC DEVICE MONITORING
AF Burden Percentage: 0
AF Burden Percentage: 0
ICD Shock Recent Count: 0
ICD Shock Recent Count: 0
RV Pacing Percentage: 0.36
RV Pacing Percentage: 0.88

## 2022-06-21 ENCOUNTER — Other Ambulatory Visit (INDEPENDENT_AMBULATORY_CARE_PROVIDER_SITE_OTHER): Payer: Self-pay | Admitting: Cardiovascular Disease

## 2022-06-27 ENCOUNTER — Encounter (INDEPENDENT_AMBULATORY_CARE_PROVIDER_SITE_OTHER): Payer: Self-pay

## 2022-06-29 ENCOUNTER — Encounter (INDEPENDENT_AMBULATORY_CARE_PROVIDER_SITE_OTHER): Payer: Self-pay

## 2022-06-29 NOTE — Progress Notes (Signed)
Message left on Ms. Consuegra's voicemail on 06/27/2022 to return call to follow up on interest in the [facility] clinical research trial, as she was referred by Dr. Nash Shearer.  Previously spoke with patient and provided an overview of the study, however patient requested additional time to consider.  A follow up MyChart message also sent to the patient.  Please contact the research department at [phone] with any questions.     Brooke Payes  [employer], [employer]  Hi [name],    I just left you a voicemail. My name is Trula Ore and I am a Designer, industrial/product at [employer]. I spoke with you in February regarding a clinical research trial that Dr. Nash Shearer referred you to. The name of the trial is [employer]. I also sent an email on 05/02/2022 with information regarding the trial, along with a copy of the Informed Consent.  I just wanted to follow up to see if you had any questions regarding the trial and to assess your interest in the trial so that I can update Dr. Nash Shearer.  For your reference, I am reattaching a copy of the Informed Consent to this message.  I can be reached at [phone].    Thank you,    Tereasa Coop, [facility]  Clinical Research Coordinator  [phone]

## 2022-07-17 ENCOUNTER — Ambulatory Visit: Payer: Medicare Other | Admitting: Hematology

## 2022-09-03 DIAGNOSIS — Z95810 Presence of automatic (implantable) cardiac defibrillator: Secondary | ICD-10-CM

## 2022-09-03 DIAGNOSIS — I469 Cardiac arrest, cause unspecified: Secondary | ICD-10-CM

## 2022-09-03 LAB — REMOTE CARDIAC DEVICE MONITORING
AF Burden Percentage: 0
ICD Shock Recent Count: 0
RV Pacing Percentage: 0.71

## 2022-09-04 ENCOUNTER — Other Ambulatory Visit (INDEPENDENT_AMBULATORY_CARE_PROVIDER_SITE_OTHER): Payer: Medicare Other | Admitting: Cardiovascular Disease

## 2022-10-02 ENCOUNTER — Other Ambulatory Visit (INDEPENDENT_AMBULATORY_CARE_PROVIDER_SITE_OTHER): Payer: Self-pay | Admitting: Cardiovascular Disease

## 2022-10-02 DIAGNOSIS — E7800 Pure hypercholesterolemia, unspecified: Secondary | ICD-10-CM

## 2022-10-04 ENCOUNTER — Encounter (HOSPITAL_BASED_OUTPATIENT_CLINIC_OR_DEPARTMENT_OTHER): Payer: Self-pay

## 2022-10-04 NOTE — Progress Notes (Signed)
Appt Date/Time: 10/16/22 @ 11:30 am    77 y.o. female   PCP: Tammi Sou, MD  Referring Physician:      Appointment Type: Follow-Up  Imaging: [place] Today- carotid dplx @ 11:00 am    Chief Complaint/HPI: 6 months f/u for surveillance of carotid stenosis, s/p R CEA by Dr. Zollie Pee 02/22/21  - Recall: carotid dplx from 04/17/22 showed patent R CEA, L ICA 50-69% stenosis & new finding of R ECA occlusion.   - h/o LUE DVT.    Date of Last Visit:  04/17/22 OV w/ NP Marchelle Folks: f/u in 6 months w/ carotid dplx    Vascular Surgery History:  - 02/22/21: R CEA by Dr. Zollie Pee    Allergies: Allergies[1]      Selected Medications:  Aspirin, Eliquis, Metformin, and Statins  Focused Prior Medical History: TIA, HTN, Diabetes, Kidney Disease, DVT, and CHF    Social Determinants of Health:  - What Matters Most:  - ETOH: ___Y___   Depression: _____  - Tobacco:         __ Smoker         _ Former Smoker      X   Never Smoker      Vital Signs this Visit Pulses  HT  BP R BP L Temp   Rad Ulnar Brach Fem Pop DP PT        R          WT  Pulse R Pulse L SpO2       L          Imaging results:        Plan of Care:               [1]   Allergies  Allergen Reactions    Shellfish-Derived Products Anaphylaxis

## 2022-10-09 ENCOUNTER — Other Ambulatory Visit (INDEPENDENT_AMBULATORY_CARE_PROVIDER_SITE_OTHER): Payer: Self-pay | Admitting: Cardiovascular Disease

## 2022-10-10 NOTE — Progress Notes (Signed)
[facility]    No chief complaint on file.    History of Present Illness   Tara Perry is a 77 y.o. female with a history of asymptomatic carotid stenosis and R CEA 02/22/2021 by Dr. Zollie Pee. She presents in 78-month follow-up with carotid duplex performed in our vascular lab today. She presents today in consultation with her son, Minerva Areola.    Today, she continues to report she has been well in the interval period and without complaints. She denies any recent symptoms of lateralizing neurologic ischemia including stroke, TIA, monocular visual changes, or amaurosis fugax. She is eager to know her carotid ultrasound results.     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  Other past medical history includes diabetes mellitus, gout and cardiomyopathy. She has history of DVT in the left upper extremity after its placement and has been on Eliquis therapy, managed by hematology.    She is compliant with Aspirin 81mg , Atorvastatin 40mg , and Eliquis 2.5mg  BID.  Past Medical History     Past Medical History:   Diagnosis Date    Anemia 08/2018    Atrophic vaginitis 08/30/2018    Bilateral cataracts     I have implants    Cardiac arrest 08/17/2018    VF arrest. She received 3 shocks, 1 round of epi with successful ROSC. 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/2018    per patient, he only sees peripheral view    Diabetes mellitus 1992    Encounter for blood transfusion     unsure of dates    Gout spondylitis     Gout synovitis     Hypertension     LBBB (left bundle branch block)     LBBB chronic    NICM (nonischemic cardiomyopathy) 08/2018    NICM, EF 20%    Renal insufficiency     Respiratory failure 08/2018    Hypoxic/anoxic respiratory failure, extubated 08/20/2018    Type 2 diabetes mellitus, controlled     Viral cardiomyopathy 2008     Past Surgical  History:   Procedure Laterality Date    COLONOSCOPY, DIAGNOSTIC (SCREENING)  2002    normal    EGD N/A 09/01/2018    Procedure: EGD;  Surgeon: Lilia Pro, MD;  Location: [facility];  Service: Gastroenterology;  Laterality: N/A;    ENDARTERECTOMY, CAROTID ARTERY Right 02/22/2021    Procedure: RIGHT CAROTID ARTERY ENDARTERECTOMY WITH NEUROMONITORING;  Surgeon: Benita Gutter, MD;  Location: [facility];  Service: Cardiovascular;  Laterality: Right;    EXPLORATORY LAPAROTOMY      EYE SURGERY      ICD IMPLANT BIV N/A 08/26/2018    Procedure: ICD IMPLANT BIV;  Surgeon: Len Childs, MD;  Location: [facility];  Service: Cardiovascular;  Laterality: N/A;  medtronic    INSERT / REPLACE / REMOVE PACEMAKER      Difibrillator August 26, 2018    LHC W/ CORONARY ANGIOS AND LV Left 08/21/2018    Procedure: LHC W/ CORONARY ANGIOS AND LV;  Surgeon: Burna Forts, MD;  Location: [facility];  Service: Cardiovascular;  Laterality: Left;    SMALL INTESTINE SURGERY  01/2013     Allergies     Allergies   Allergen Reactions    Shellfish-Derived Products Anaphylaxis  Medications       Current Outpatient Medications:     apixaban (Eliquis) 2.5 MG, TAKE 1 TABLET EVERY 12 HOURS, Disp: 180 tablet, Rfl: 1    aspirin EC 81 MG EC tablet, Take 1 tablet (81 mg) by mouth daily, Disp: , Rfl:     atorvastatin (LIPITOR) 40 MG tablet, TAKE 2 TABLETS NIGHTLY, Disp: 180 tablet, Rfl: 1    carvedilol (Coreg) 12.5 MG tablet, Take 1 tablet (12.5 mg) by mouth 2 (two) times daily with meals, Disp: 180 tablet, Rfl: 3    glimepiride (AMARYL) 2 MG tablet, Take 2 tablets (4 mg) by mouth daily, Disp: , Rfl:     Jardiance 25 MG tablet, 1 tablet (25 mg) every morning, Disp: , Rfl:     metFORMIN (GLUCOPHAGE) 500 MG tablet, Take 1 tablet (500 mg total) by mouth 2 (two) times daily Morning and night (Patient taking differently: Take 1 tablet (500 mg) by mouth 2 (two) times daily with meals), Disp: , Rfl:     Multiple Vitamins-Minerals (CENTRUM  PO), Take by mouth daily, Disp: , Rfl:     sacubitril-valsartan (Entresto) 49-51 MG Tab per tablet, Take 1 tablet by mouth 2 (two) times daily, Disp: 180 tablet, Rfl: 3    sacubitril-valsartan (Entresto) 49-51 MG Tab per tablet, Take 1 tablet by mouth 2 (two) times daily, Disp: 28 tablet, Rfl: 0    vitamin B-12 (CYANOCOBALAMIN) 100 MCG tablet, Take 1 tablet (100 mcg) by mouth daily, Disp: , Rfl:     Family History   Problem Relation Age of Onset    Cancer Father 38        bladder    Diabetes Father     Heart attack Father      Social History     Socioeconomic History    Marital status: Widowed   Tobacco Use    Smoking status: Never    Smokeless tobacco: Never   Vaping Use    Vaping status: Never Used   Substance and Sexual Activity    Alcohol use: Not Currently     Alcohol/week: 1.0 standard drink of alcohol     Types: 1 Glasses of wine per week     Comment: rarely    Drug use: No    Sexual activity: Not Currently     Physical Exam     There were no vitals filed for this visit.    There is no height or weight on file to calculate BMI.    Physical Exam  Vitals reviewed.   Constitutional:       General: She is not in acute distress.     Appearance: Normal appearance.   HENT:      Head: Normocephalic and atraumatic.   Cardiovascular:      Rate and Rhythm: Normal rate and regular rhythm.      Pulses:           Carotid pulses are 2+ on the right side and 2+ on the left side.       Radial pulses are 2+ on the right side and 2+ on the left side.      Heart sounds: Normal heart sounds.   Pulmonary:      Effort: Pulmonary effort is normal.      Breath sounds: Normal breath sounds.   Skin:     General: Skin is warm and dry.      Comments: L neck incision well-healed   Neurological:  General: No focal deficit present.      Mental Status: She is alert and oriented to person, place, and time.   Psychiatric:         Attention and Perception: Attention normal.         Mood and Affect: Mood and affect normal.         Speech:  Speech normal.         Behavior: Behavior normal. Behavior is cooperative.         Thought Content: Thought content normal.        Imaging   10/16/2022 Carotid duplex performed in our vascular lab:   1. No evidence of stenosis in the right internal carotid artery status post carotid endarterectomy. Elevated velocities seen in the proximal internal carotid artery, possibly due to intimal hyperplasia.  2. 50-69% stenosis in the left internal carotid artery.  3. The right proximal external carotid artery appears occluded with distal recanalization.  4. Hemodynamically insignificant stenosis visualized in the left external carotid artery.  5. Bilateral common carotid and subclavian arteries appear patent with areas of insignificant stenosis.  6. Bilateral vertebral arteries appear patent with antegrade flow.  7. Incidental finding of a vascularized mass with mixed echogenicity seen in the right proximal common carotid area measuring approximately 1.9cm x 1.8cm.    04/17/2022 Carotid Duplex performed in our vascular lab:   1. Bilateral common, internal and left external carotid arteries were visualized and appeared patent bilaterally. The vertebral and subclavian arteries were visualized and appeared patent. Antegrade flow was noted in the vertebral artery.   2. No evidence of hemodynamically significant disease right s/p carotid endarterectomy. The right proximal ECA was occluded.   3. Heterogenous calcified plaque was seen at left bulb, and proximal ICA. 50-69% stenosis in the left ICA.     Results reviewed with patient in the office.  Assessment and Plan   77 year old female with a history right carotid endarterectomy that I performed in 2022.  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  The patient will follow-up with me in 6 months for repeat carotid duplex scan to evaluate for any progression of the neointimal hyperplasia or the carotid stenosis.  She should continue her aspirin and statin therapy.    Thank you for allowing me the privilege of participating in the care of your patient.  Please call me if you have any questions.      Sincerely,     Lutricia Horsfall, MD, FACS, RPVI  Vascular Surgery & Endovascular Intervention  [facility]  Staff Surgeon, [facility] [facility] [place]  [address], [address], [place], [place] [address]  [address] [place], [place] [address]  Tel: [phone] / Fax: [phone]  [email or web address]    Orders Placed This Encounter   Procedures    US Carotid Duplex Dopp Comp Bilateral       I performed a face to face clinical assessment of the patient including history and physical exam with pertinent findings noted. Additional time was spent reviewing existing diagnostic studies and constructing a diagnostic and treatment plan. Greater than 50% of the time  was spent face to face in coordination and counseling regarding implementation of the plan which was discussed in detail with the patient including risks and benefits. Based on the complexity of the case, 20 minutes were spent in this process.    This note was generated by the Epic EMR system/ Dragon speech recognition and may contain inherent errors or omissions not intended by the user. Grammatical errors, random word insertions, deletions, pronoun errors and incomplete sentences are occasional consequences of this technology due to software limitations. Not all errors are caught or corrected. If there are questions or concerns about the content of this note or information contained within the body of this dictation they should be addressed directly  with the author for clarification.

## 2022-10-16 ENCOUNTER — Encounter (INDEPENDENT_AMBULATORY_CARE_PROVIDER_SITE_OTHER): Payer: Self-pay | Admitting: Specialist

## 2022-10-16 ENCOUNTER — Ambulatory Visit (INDEPENDENT_AMBULATORY_CARE_PROVIDER_SITE_OTHER): Payer: Medicare Other

## 2022-10-16 ENCOUNTER — Ambulatory Visit (INDEPENDENT_AMBULATORY_CARE_PROVIDER_SITE_OTHER): Payer: Medicare Other | Admitting: Specialist

## 2022-10-16 VITALS — BP 146/72 | HR 71 | Temp 98.1°F | Ht 64.0 in | Wt 130.0 lb

## 2022-10-16 DIAGNOSIS — I6523 Occlusion and stenosis of bilateral carotid arteries: Secondary | ICD-10-CM

## 2022-10-16 DIAGNOSIS — I6521 Occlusion and stenosis of right carotid artery: Secondary | ICD-10-CM

## 2022-11-07 ENCOUNTER — Other Ambulatory Visit (INDEPENDENT_AMBULATORY_CARE_PROVIDER_SITE_OTHER): Payer: Self-pay | Admitting: Cardiovascular Disease

## 2022-11-14 ENCOUNTER — Ambulatory Visit (INDEPENDENT_AMBULATORY_CARE_PROVIDER_SITE_OTHER): Payer: Medicare Other

## 2022-11-14 ENCOUNTER — Other Ambulatory Visit (INDEPENDENT_AMBULATORY_CARE_PROVIDER_SITE_OTHER): Payer: Self-pay | Admitting: Cardiovascular Disease

## 2022-11-14 DIAGNOSIS — Z4502 Encounter for adjustment and management of automatic implantable cardiac defibrillator: Secondary | ICD-10-CM

## 2022-11-14 LAB — IN OFFICE CARDIAC DEVICE MONITORING
AF Burden Percentage: 0.1
RV Pacing Percentage: 98.4

## 2022-11-15 ENCOUNTER — Encounter: Payer: Self-pay | Admitting: Hematology

## 2022-11-15 ENCOUNTER — Ambulatory Visit: Payer: Medicare Other | Attending: Hematology | Admitting: Hematology

## 2022-11-15 ENCOUNTER — Telehealth: Payer: Self-pay | Admitting: Hematology

## 2022-11-15 VITALS — BP 109/65 | HR 77 | Temp 97.6°F | Wt 133.0 lb

## 2022-11-15 DIAGNOSIS — I82622 Acute embolism and thrombosis of deep veins of left upper extremity: Secondary | ICD-10-CM | POA: Insufficient documentation

## 2022-11-15 DIAGNOSIS — E538 Deficiency of other specified B group vitamins: Secondary | ICD-10-CM | POA: Insufficient documentation

## 2022-11-15 NOTE — Progress Notes (Signed)
OFFICE VISIT - [facility]      Total time spent prior to the visit reviewing records and prior labs:  5 min  Total time spent face to face with patient: 10 min  Total time spent after face to face for coordination of care and documentation:  5 min  Total time spent on encounter: 20 min  Regarding: rpv LUE provoked DVT , RUE DVT     HPI/HEMATOLOGY HISTORY: Ms. Gajjar is a 77 y.o. female with history of DM, NICM, CKD, UE VTE events.    Patient was admitted 6/5-6/16/20 for VF cardiac arrest- underwent hypothermic protocol. s/p BiV ICD placement 08/26/18. She presented to [facility] 08/30/18 with LUE swelling. She was found to have acute subclavian DVT. She was started on Lovenox therapeutic. Her course was complicated by hematemesis on 09/01/18. EGD showed large duodenal ulcer with visible vessels s/p cauterization, clipping. Also noted to have RUE extensive R basilic occlusive thrombus and phlebitis 09/06/18. She resumed anticoagulation and was discharged on Lovenox 1mg /kg BID after stability of hemoglobin on a/c.     Switched to Eliquis 5mg  BID from Lovenox in September.     COVID vaccinated    02/22/21: Dr. Zollie Pee - s/p R CEA for high grade R ICA stenosis (80-99%), discharged on ASA 81mg  and Eliquis, statin.     INTERVAL HISTORY:   L carotid monitoring q6 mo but no need right now for surgery.     Continues Eliquis 2.5mg  BID, ASA and statin.  Taking B12 every other day.    No bleeding visualized.   No SOB, no chest pain, no arm or leg swelling.    Started rybelsis about a week ago for elevated sugars despite metformin use. So far tolerating it ok.    MEDICATIONS/ALLERGIES:     Outpatient Medications Marked as Taking for the 11/15/22 encounter (Office Visit) with Russ Halo, MD   Medication Sig Dispense Refill    apixaban (Eliquis) 2.5 MG TAKE 1 TABLET EVERY 12 HOURS 180 tablet 1    aspirin EC 81 MG EC tablet Take 1 tablet (81 mg) by mouth daily      atorvastatin (LIPITOR) 40 MG tablet TAKE 2 TABLETS  NIGHTLY 180 tablet 1    carvedilol (COREG) 12.5 MG tablet TAKE 1 TABLET TWICE A DAY WITH MEALS 180 tablet 1    glimepiride (AMARYL) 2 MG tablet Take 2 tablets (4 mg) by mouth daily      Jardiance 25 MG tablet 1 tablet (25 mg) every morning      Multiple Vitamins-Minerals (CENTRUM PO) Take by mouth daily      sacubitril-valsartan (Entresto) 49-51 MG Tab per tablet Take 1 tablet by mouth 2 (two) times daily 180 tablet 3    vitamin B-12 (CYANOCOBALAMIN) 100 MCG tablet Take 1 tablet (100 mcg) by mouth daily       Allergies   Allergen Reactions    Shellfish-Derived Products Anaphylaxis     PAST HISTORY:    Past Medical History:   Diagnosis Date    Anemia 08/2018    Atrophic vaginitis 08/30/2018    Bilateral cataracts     I have implants    Cardiac arrest 08/17/2018    VF arrest. She received 3 shocks, 1 round of epi with successful ROSC. 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/2018  per patient, he only sees peripheral view    Diabetes mellitus 1992    Encounter for blood transfusion     unsure of dates    Gout spondylitis     Gout synovitis     Hypertension     LBBB (left bundle branch block)     LBBB chronic    NICM (nonischemic cardiomyopathy) 08/2018    NICM, EF 20%    Renal insufficiency     Respiratory failure 08/2018    Hypoxic/anoxic respiratory failure, extubated 08/20/2018    Type 2 diabetes mellitus, controlled     Viral cardiomyopathy 2008      Past Surgical History:   Procedure Laterality Date    COLONOSCOPY, DIAGNOSTIC (SCREENING)  2002    normal    EGD N/A 09/01/2018    Procedure: EGD;  Surgeon: Lilia Pro, MD;  Location: [id number];  Service: Gastroenterology;  Laterality: N/A;    ENDARTERECTOMY, CAROTID ARTERY Right 02/22/2021    Procedure: RIGHT CAROTID ARTERY ENDARTERECTOMY WITH NEUROMONITORING;  Surgeon: Benita Gutter, MD;  Location: [place] [facility];  Service: Cardiovascular;   Laterality: Right;    EXPLORATORY LAPAROTOMY      EYE SURGERY      ICD IMPLANT BIV N/A 08/26/2018    Procedure: ICD IMPLANT BIV;  Surgeon: Len Childs, MD;  Location: [facility];  Service: Cardiovascular;  Laterality: N/A;  medtronic    INSERT / REPLACE / REMOVE PACEMAKER      Difibrillator August 26, 2018    LHC W/ CORONARY ANGIOS AND LV Left 08/21/2018    Procedure: LHC W/ CORONARY ANGIOS AND LV;  Surgeon: Burna Forts, MD;  [facility]: [facility];  Service: Cardiovascular;  Laterality: Left;    SMALL INTESTINE SURGERY  01/2013     Family History   Problem Relation Age of Onset    Cancer Father 70        bladder    Diabetes Father     Heart attack Father      Social History:   5 grandkids (53mo-19y/o)  Non smoker   Walks routinely     Patient Active Problem List   Diagnosis    Gout synovitis    Viral cardiomyopathy    CHF (congestive heart failure)    Atrophic vaginitis    Iron deficiency anemia    Asymptomatic carotid artery stenosis without infarction, bilateral    CIED MRI Order Document 22 Sep 22 valid until 22 Sep 23    Type 2 diabetes mellitus with diabetic chronic kidney disease    Mitral valve regurgitation    History of left bundle branch block (LBBB)    History of blood transfusion    Nonischemic cardiomyopathy    History of TIA (transient ischemic attack)    History of cardiac arrest    History of DVT (deep vein thrombosis)    Carotid stenosis, right     REVIEW OF SYSTEMS: All other systems reviewed and negative except as above.   All other systems reviewed and negative except as above.   Gen/Constitutional: No fevers, no chills, no unintentional weight loss, no fatigue.  Eyes: No visual changes   ENT: no hearing loss, no nasal discharge, no epistaxis, no sore throat   Cdv: no chest pain, no palpitations  Resp: no cough, no shortness of breath, no wheezing  GI: no abdominal pain, no heartburn, no nausea/vomiting, no diarrhea, no constipation, no hematochezia, no melena   GU: no hematuria, no  urinary frequency  Musculoskeletal: no joint pain, no joint swelling, no restricted motion, no peripheral edema, no varicosities   Skin: no rashes, no sores, no blisters  Neuro: no numbness, no tingling, no burning sensation  Psych: no nervousness, no anxiety, no depression  Endo: no heat/cold intolerance, no excessive thirst  Hem/Lymph: no bleeding (epistaxis, mucosal bleeding, hematemesis, hemoptysis, hematochezia, hematuria, melena), no easy bruising, no lymphadenopathy     PHYSICAL EXAMINATION  Vital Signs: BP 109/65 (BP Site: Right arm, Patient Position: Sitting, Cuff Size: Medium)   Pulse 77   Temp 97.6 F (36.4 C) (Temporal)   Wt 60.3 kg (133 lb)   SpO2 97%   BMI 22.83 kg/m        General Appearance:  Alert, cooperative, no distress, appears stated age   Head: Normocephalic, without obvious abnormality, atraumatic   Eyes:  Sclera anicteric, conjunctiva without pallor   Lungs:    Abdomen:   Lung sounds CTA b/l     Non-distended   Musculoskeletal: No gait abnormalities    Skin: Normal skin color, texture, no bleeding or ecchymoses, no rashes or lesions   Extremities: No UE or LE edema   Neurologic:  Mental Status: Alert and oriented x3, face symmetric, tongue midline.  Normal mood and affect, cooperative     LABS/RADIOLOGY:     No visits with results within 1 Day(s) from this visit.   Latest known visit with results is:   Orders Only on 11/14/2022   Component Date Value Ref Range Status    Device Type 11/14/2022 CRT-D   Final    RV Pacing Percentage 11/14/2022 98.40   Final    AF Burden Percentage 11/14/2022 0.10   Final     LUE Doppler U/S 09/01/18: IMPRESSION:   Extensive occlusive and nonocclusive left upper extremity DVT in the  region of left pacer wire, as detailed. Per discussion with clinical  team at the time of dictation, there are aware of this finding.    RUE Doppler U/S 09/07/18: IMPRESSION:    Extensive right basilic occlusive thrombus in associated  thrombophlebitis. Continuous flow right  cephalic vein. Other findings as  Above.      IMPRESSION/RECOMMENDATIONS: Ms. Carrie is a 77 y.o. female with   1. Arm DVT (deep venous thromboembolism), acute, left    2. B12 deficiency        1. Provoked DVT: LUE provoked dvt from pacer wires and acute illness. RUE DVT provoked in setting of line. She has completed 3 months therapeutic anticoagulation. Reviewed ultrasound reports from 01/10/19- no evidence of dvt but some minor chronic changes bilaterally. I am inclined to use prophylactic dosage anticoagulation going forward as opposed to stopping a/c completed as the ICD lead wire may very well be at risk /nidus for further recurrent clot formation-- though alternately, the lead would likely be endothelialized and perhaps pose less of a risk than prior. Patient is in agreement with continuation of low dose anticoagulation.   - s/p R CEA for high grade R ICA stenosis (80-99%) in Dec 2022, resumed ASA, statin and Eliquis at discharge.   - Cont Eliquis 2.5mg  BID (refill sent by cardiologist).   -Discussed continued good hydration, good movement, staying physically active and fit to decrease risk of thrombotic events. She is to notify me if any concerns with clotting (or bleeding) right away.    2. Iron defn anemia: history of IDA in setting of GI bleed. Resolved. Review outside CBC.    3. B12 deficiency: repeat B12 levels  normal previously. Review outside labs.     Have requested outside lab results from PCP therefore will review those to see if require any other lab work for now.    Pt instructed to send non urgent messages via MyChart and provided with [facility] clinic phone number. RTC 12 months.    Thank you for involving me in this patient's care.      Russ Halo, MD  Benign Hematology  [facility]  [address]  [place], [place] [address]  Phone: [phone]  Fax: [phone]

## 2022-11-15 NOTE — Telephone Encounter (Signed)
Please contact PCP Dr Nicanor Bake and have most recent lab results from 10/2022 faxed over for my review    thx

## 2022-11-16 NOTE — Telephone Encounter (Signed)
Spoke with front desk staff at [facility], who confirmed they would be sending the pt's August labs over by fax (RN gave clinic fax number to staff). However, they mentioned there was only a CMP, Hgb A1C, and lipid panel drawn. Will review with Dr. Zollie Pee to see if further blood work needed.

## 2022-11-17 ENCOUNTER — Other Ambulatory Visit: Payer: Self-pay

## 2022-11-17 DIAGNOSIS — Z86718 Personal history of other venous thrombosis and embolism: Secondary | ICD-10-CM

## 2022-11-17 DIAGNOSIS — E538 Deficiency of other specified B group vitamins: Secondary | ICD-10-CM

## 2022-11-17 NOTE — Telephone Encounter (Addendum)
Discussed with Dr. Zollie Pee; still need CBC/B12/d-dimer at pt's convenience. Labs ordered. Attempted to call pt to review; LVMTRCB.    Addendum (9/9): 2nd attempt to call to request above labs. LVMTRCB.

## 2022-11-21 ENCOUNTER — Other Ambulatory Visit (FREE_STANDING_LABORATORY_FACILITY): Payer: Medicare Other

## 2022-11-21 DIAGNOSIS — Z86718 Personal history of other venous thrombosis and embolism: Secondary | ICD-10-CM

## 2022-11-21 DIAGNOSIS — E538 Deficiency of other specified B group vitamins: Secondary | ICD-10-CM

## 2022-11-21 LAB — LAB USE ONLY - CBC WITH DIFFERENTIAL
Absolute Basophils: 0.05 10*3/uL (ref 0.00–0.08)
Absolute Eosinophils: 0.26 10*3/uL (ref 0.00–0.44)
Absolute Immature Granulocytes: 0.02 10*3/uL (ref 0.00–0.07)
Absolute Lymphocytes: 1.89 10*3/uL (ref 0.42–3.22)
Absolute Monocytes: 0.85 10*3/uL (ref 0.21–0.85)
Absolute Neutrophils: 4.94 10*3/uL (ref 1.10–6.33)
Absolute nRBC: 0 10*3/uL (ref <=0.00)
Basophils %: 0.6 %
Eosinophils %: 3.2 %
Hematocrit: 39 % (ref 34.7–43.7)
Hemoglobin: 12.9 g/dL (ref 11.4–14.8)
Immature Granulocytes %: 0.2 %
Lymphocytes %: 23.6 %
MCH: 29.7 pg (ref 25.1–33.5)
MCHC: 33.1 g/dL (ref 31.5–35.8)
MCV: 89.9 fL (ref 78.0–96.0)
MPV: 10.1 fL (ref 8.9–12.5)
Monocytes %: 10.6 %
Neutrophils %: 61.8 %
Platelet Count: 179 10*3/uL (ref 142–346)
Preliminary Absolute Neutrophil Count: 4.94 10*3/uL (ref 1.10–6.33)
RBC: 4.34 10*6/uL (ref 3.90–5.10)
RDW: 12 % (ref 11–15)
WBC: 8.01 10*3/uL (ref 3.10–9.50)
nRBC %: 0 /100{WBCs} (ref <=0.0)

## 2022-11-21 LAB — D-DIMER: D-Dimer: 0.32 ug{FEU}/mL (ref <0.50)

## 2022-11-21 LAB — VITAMIN B12: Vitamin B-12: 1115 pg/mL — ABNORMAL HIGH (ref 211–911)

## 2022-11-24 NOTE — Telephone Encounter (Addendum)
Outstanding labs complete: per Dr. Zollie Pee, labs look great, no need for any changes. Discussed with patient. Patient verbalized understanding of plan and denied further questions at the end of the call. RN advised pt to call back if any questions arise.

## 2022-12-03 DIAGNOSIS — Z95810 Presence of automatic (implantable) cardiac defibrillator: Secondary | ICD-10-CM

## 2022-12-03 DIAGNOSIS — I469 Cardiac arrest, cause unspecified: Secondary | ICD-10-CM

## 2022-12-03 LAB — REMOTE CARDIAC DEVICE MONITORING
AF Burden Percentage: 0
ICD Shock Recent Count: 0
RV Pacing Percentage: 0.65

## 2022-12-04 ENCOUNTER — Other Ambulatory Visit (INDEPENDENT_AMBULATORY_CARE_PROVIDER_SITE_OTHER): Payer: Self-pay | Admitting: Cardiovascular Disease

## 2022-12-11 ENCOUNTER — Other Ambulatory Visit (INDEPENDENT_AMBULATORY_CARE_PROVIDER_SITE_OTHER): Payer: Self-pay | Admitting: Cardiovascular Disease

## 2022-12-11 DIAGNOSIS — I5042 Chronic combined systolic (congestive) and diastolic (congestive) heart failure: Secondary | ICD-10-CM

## 2023-02-23 ENCOUNTER — Other Ambulatory Visit (INDEPENDENT_AMBULATORY_CARE_PROVIDER_SITE_OTHER): Payer: Medicare Other | Admitting: Cardiovascular Disease

## 2023-03-04 DIAGNOSIS — Z95810 Presence of automatic (implantable) cardiac defibrillator: Secondary | ICD-10-CM

## 2023-03-04 DIAGNOSIS — I469 Cardiac arrest, cause unspecified: Secondary | ICD-10-CM

## 2023-03-04 LAB — REMOTE CARDIAC DEVICE MONITORING
AF Burden Percentage: 0
ICD Shock Recent Count: 0
RV Pacing Percentage: 2.01

## 2023-04-02 ENCOUNTER — Other Ambulatory Visit (INDEPENDENT_AMBULATORY_CARE_PROVIDER_SITE_OTHER): Payer: Self-pay | Admitting: Cardiovascular Disease

## 2023-04-02 DIAGNOSIS — E7800 Pure hypercholesterolemia, unspecified: Secondary | ICD-10-CM

## 2023-04-05 NOTE — Progress Notes (Deleted)
 OFFICE VISIT  Vascular and Endovascular Surgery      Date: April 05, 2023  Vascular Surgeon: Lennette Spar, MD   Chief Complaint:  No chief complaint on file.      History of Present Illness     Tara Perry is a 78 y.o. female with a history of asymptomatic carotid stenosis and R CEA 02/22/2021 by Dr. Bade. She presents in 9-month follow-up with carotid duplex performed in our vascular lab today. She presents today in consultation with her son, [name].    ***Today, she continues to report she has been well in the interval period and without complaints. She denies any recent symptoms of lateralizing neurologic ischemia including stroke, TIA, monocular visual changes, or amaurosis fugax. She is eager to know her carotid ultrasound results.     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  Other past medical history includes diabetes mellitus, gout and cardiomyopathy. She has history of DVT in the left upper extremity after its placement and has been on Eliquis  therapy, managed by hematology.    She is compliant with Aspirin  81mg , Atorvastatin  40mg , and Eliquis  2.5mg  BID.    ***Carotid Duplex performed in our vascular lab      Past Medical History     Past Medical History:   Diagnosis Date    Anemia 08/2018    Atrophic vaginitis 08/30/2018    Bilateral cataracts     I have implants    Cardiac arrest 08/17/2018    VF arrest. She received 3 shocks, 1 round of epi with successful ROSC. 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/2018    per patient, he only sees peripheral view    Diabetes mellitus 1992    Encounter for blood transfusion     unsure of dates    Gout spondylitis     Gout synovitis     Hypertension     LBBB (left bundle branch block)     LBBB chronic    NICM (nonischemic cardiomyopathy) 08/2018    NICM, EF 20%    Renal insufficiency     Respiratory  failure 08/2018    Hypoxic/anoxic respiratory failure, extubated 08/20/2018    Type 2 diabetes mellitus, controlled     Viral cardiomyopathy 2008       Past Surgical History   Past Surgical History[1]    Family History    Family History[2]    Social Histroy   Social History[3]    Allergies   Allergies[4]    Medications   Current Medications[5]    Review of Systems   A comprehensive twelve point review of systems was: Negative in the neurologic, respiratory, cardiac, vascular, gastrointestinal, genitourinary, musculoskeletal, immunologic, psychiatric or endocrinologic systems except as mentioned above in HPI.          [1]   Past Surgical History:  Procedure Laterality Date    COLONOSCOPY, DIAGNOSTIC (SCREENING)  2002    normal    EGD N/A 09/01/2018    Procedure: EGD;  Surgeon: Luke Jamaica, MD;  Location: [id number];  Service: Gastroenterology;  Laterality: N/A;    ENDARTERECTOMY, CAROTID ARTERY Right 02/22/2021    Procedure: RIGHT CAROTID ARTERY ENDARTERECTOMY WITH NEUROMONITORING;  Surgeon: Spar Lennette LABOR, MD;  Location: [place] [facility];  Service: Cardiovascular;  Laterality: Right;  EXPLORATORY LAPAROTOMY      EYE SURGERY      ICD IMPLANT BIV N/A 08/26/2018    Procedure: ICD IMPLANT BIV;  Surgeon: Katherleen Lamar CROME, MD;  Location: [facility];  Service: Cardiovascular;  Laterality: N/A;  medtronic    INSERT / REPLACE / REMOVE PACEMAKER      Difibrillator August 26, 2018    LHC W/ CORONARY ANGIOS AND LV Left 08/21/2018    Procedure: LHC W/ CORONARY ANGIOS AND LV;  Surgeon: Angelena Marsa MATSU, MD;  [facility];  Service: Cardiovascular;  Laterality: Left;    SMALL INTESTINE SURGERY  01/2013   [2]   Family History  Problem Relation Name Age of Onset    Cancer Father Prentice A. Von Rembow 70        bladder    Diabetes Father Prentice A. Von Rembow     Heart attack Father Prentice A. Von Rembow    [3]   Social History  Tobacco Use    Smoking status: Never    Smokeless tobacco: Never   Vaping Use    Vaping status:  Never Used   Substance Use Topics    Alcohol use: Not Currently     Alcohol/week: 1.0 standard drink of alcohol     Types: 1 Glasses of wine per week     Comment: rarely    Drug use: No   [4]   Allergies  Allergen Reactions    Shellfish-Derived Products Anaphylaxis   [5]   Current Outpatient Medications:     apixaban  (Eliquis ) 2.5 MG, TAKE 1 TABLET EVERY 12 HOURS, Disp: 180 tablet, Rfl: 1    aspirin  EC 81 MG EC tablet, Take 1 tablet (81 mg) by mouth daily, Disp: , Rfl:     atorvastatin  (LIPITOR) 40 MG tablet, TAKE 2 TABLETS NIGHTLY, Disp: 180 tablet, Rfl: 0    carvedilol  (COREG ) 12.5 MG tablet, TAKE 1 TABLET TWICE A DAY WITH MEALS, Disp: 180 tablet, Rfl: 1    glimepiride  (AMARYL ) 2 MG tablet, Take 2 tablets (4 mg) by mouth daily, Disp: , Rfl:     Jardiance 25 MG tablet, 1 tablet (25 mg) every morning, Disp: , Rfl:     metFORMIN  (GLUCOPHAGE ) 500 MG tablet, Take 1 tablet (500 mg total) by mouth 2 (two) times daily Morning and night (Patient not taking: Reported on 11/15/2022), Disp: , Rfl:     Multiple Vitamins-Minerals (CENTRUM PO), Take by mouth daily, Disp: , Rfl:     sacubitril -valsartan  (Entresto ) 49-51 MG Tab per tablet, Take 1 tablet by mouth 2 (two) times daily (Patient not taking: Reported on 11/15/2022), Disp: 28 tablet, Rfl: 0    sacubitril -valsartan  (Entresto ) 49-51 MG Tab per tablet, TAKE 1 TABLET TWICE A DAY, Disp: 180 tablet, Rfl: 1    vitamin B-12 (CYANOCOBALAMIN ) 100 MCG tablet, Take 1 tablet (100 mcg) by mouth daily, Disp: , Rfl:

## 2023-04-09 ENCOUNTER — Other Ambulatory Visit (INDEPENDENT_AMBULATORY_CARE_PROVIDER_SITE_OTHER): Payer: Self-pay | Admitting: Cardiovascular Disease

## 2023-04-12 ENCOUNTER — Encounter (INDEPENDENT_AMBULATORY_CARE_PROVIDER_SITE_OTHER): Payer: Self-pay

## 2023-04-12 NOTE — Progress Notes (Signed)
 THIS FORM IS FOR INTERNAL USE FOR VASCULAR SURGERY AND IS NOT A PROGRESS NOTE  Appt Date/Time: 04/16/23                         1045                  Room:      78 y.o. female   PCP: Gideon Bruckner, MD                                    Referring Physician:   Preferred Language: English    Appointment Type: Follow Up  Imaging: US  Today- carotid dplx    Chief Complaint/HPI: L carotid stenosis and Hx of R CEA  2022  Date of Last Visit:     Vascular Surgery History:  - 02/22/21:  R  CEA    Allergies: Allergies[1]    Selected Medications:  Aspirin , Eliquis /Apixaban , Other Diabetes Medications, and Statins    Focused Prior Medical History: Diabetes, DVT, HLD, HTN, and TIA    Social Determinants of Health:  - Smoking Status: Never  - EtOH: ____  - What Matters Most:       Depression: _____    Vital Signs this Visit Pulses  HT  BP R BP L Temp   Rad Ulnar Brach Fem Pop DP PT        R          WT  Pulse R Pulse L SpO2       L                   [1]   Allergies  Allergen Reactions    Shellfish-Derived Products Anaphylaxis

## 2023-04-16 ENCOUNTER — Ambulatory Visit (INDEPENDENT_AMBULATORY_CARE_PROVIDER_SITE_OTHER): Payer: Medicare Other | Admitting: Specialist

## 2023-04-16 ENCOUNTER — Other Ambulatory Visit (INDEPENDENT_AMBULATORY_CARE_PROVIDER_SITE_OTHER): Payer: Medicare Other

## 2023-04-19 NOTE — Progress Notes (Signed)
 OFFICE VISIT  Vascular and Endovascular Surgery      Date: April 27, 2023  Vascular Surgeon: Lennette Spar, MD   Chief Complaint:    Chief Complaint   Patient presents with    Carotid Stenosis Surveillance       History of Present Illness     Tara Perry is a 78 y.o. female with a history of asymptomatic carotid stenosis and R CEA 02/22/2021 by Dr. Bade. She presents in 55-month follow-up with carotid duplex performed in our vascular lab today. She presents today in consultation with her son, [name].    Today, she reports she is doing well. She denies any recent hospital stays or surgeries. She also   denies any recent symptoms of lateralizing neurologic ischemia including stroke, TIA, monocular visual changes, or amaurosis fugax.     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  Other past medical history includes diabetes mellitus, gout and cardiomyopathy. She has history of DVT in the left upper extremity after its placement and has been on Eliquis  therapy, managed by hematology.    She is compliant with Aspirin  81mg , Atorvastatin  40mg , and Eliquis  2.5mg  BID.    Past Medical History     Past Medical History:   Diagnosis Date    Anemia 08/2018    Atrophic vaginitis 08/30/2018    Bilateral cataracts     I have implants    Cardiac arrest 08/17/2018    VF arrest. She received 3 shocks, 1 round of epi with successful ROSC. 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/2018    per patient, he only sees peripheral view    Diabetes mellitus 1992    Encounter for blood transfusion     unsure of dates    Gout spondylitis     Gout synovitis     Hypertension     LBBB (left bundle branch block)     LBBB chronic    NICM (nonischemic cardiomyopathy) 08/2018    NICM, EF 20%    Renal insufficiency     Respiratory failure 08/2018    Hypoxic/anoxic respiratory failure,  extubated 08/20/2018    Type 2 diabetes mellitus, controlled     Viral cardiomyopathy 2008       Past Surgical History   Past Surgical History[1]    Family History    Family History[2]    Social Histroy   Social History[3]    Allergies   Allergies[4]    Medications   Current Medications[5]    Review of Systems   A comprehensive twelve point review of systems was: Negative in the neurologic, respiratory, cardiac, vascular, gastrointestinal, genitourinary, musculoskeletal, immunologic, psychiatric or endocrinologic systems except as mentioned above in HPI.       Physical Exam     Vitals:    04/27/23 1007   BP: 107/70   BP Site: Left arm   Patient Position: Sitting   Cuff Size: Large   Pulse: 72   SpO2: 98%       There is no height or weight on file to calculate BMI.    General:  Patient appears their stated age, well-nourished.  Alert and in no apparent distress. R CEA well healed.   Cardiac: RRR, carotid bruits not present, no JVD. Extremities warm, pulses: radial rt  2+ radial lt 2+,  Zku:Qloo ROM, symmetric  Skin: Color appropriate for race, Skin warm, dry, gangreneNo  Neuro: CN II-XII intact, gross motor and sensory intact  Psych: AAOX3, appropriate mood and affect.      Labs       CBC:   WBC   Date/Time Value Ref Range Status   11/21/2022 10:41 AM 8.01 3.10 - 9.50 x10 3/uL Final   01/11/2022 10:48 AM 8.92 3.10 - 9.50 x10 3/uL Final   08/03/2006 05:00 AM 14.1 (H) 3.5 - 10.8 /CUMM Final     RBC   Date/Time Value Ref Range Status   11/21/2022 10:41 AM 4.34 3.90 - 5.10 x10 6/uL Final   01/11/2022 10:48 AM 4.36 3.90 - 5.10 x10 6/uL Final     Hemoglobin   Date/Time Value Ref Range Status   11/21/2022 10:41 AM 12.9 11.4 - 14.8 g/dL Final   92/69/7979 88:90 AM 10.2 (L) 11.1 - 15.9 g/dL Final     Hgb   Date/Time Value Ref Range Status   01/11/2022 10:48 AM 13.1 11.4 - 14.8 g/dL Final     Hematocrit   Date/Time Value Ref Range Status   11/21/2022 10:41 AM 39.0 34.7 - 43.7 % Final   01/11/2022 10:48 AM 39.8 34.7 - 43.7 % Final      MCV   Date/Time Value Ref Range Status   11/21/2022 10:41 AM 89.9 78.0 - 96.0 fL Final   01/11/2022 10:48 AM 91.3 78.0 - 96.0 fL Final     MCHC   Date/Time Value Ref Range Status   11/21/2022 10:41 AM 33.1 31.5 - 35.8 g/dL Final   88/98/7976 89:51 AM 32.9 31.5 - 35.8 g/dL Final     RDW   Date/Time Value Ref Range Status   11/21/2022 10:41 AM 12 11 - 15 % Final   01/11/2022 10:48 AM 12 11 - 15 % Final     Platelet Count   Date/Time Value Ref Range Status   11/21/2022 10:41 AM 179 142 - 346 x10 3/uL Final     Platelets   Date/Time Value Ref Range Status   01/11/2022 10:48 AM 224 142 - 346 x10 3/uL Final   10/10/2018 11:09 AM 269 150 - 450 x10E3/uL Final       CMP:   Sodium   Date/Time Value Ref Range Status   01/11/2022 10:48 AM 139 135 - 145 mEq/L Final     Potassium   Date/Time Value Ref Range Status   01/11/2022 10:48 AM 4.6 3.5 - 5.3 mEq/L Final     Comment:     Interpret with caution. Potassium may be falsely elevated due to the high  hemolysis index. Recollect if clinically indicated.       Chloride   Date/Time Value Ref Range Status   01/11/2022 10:48 AM 108 99 - 111 mEq/L Final     CO2   Date/Time Value Ref Range Status   01/11/2022 10:48 AM 22 17 - 29 mEq/L Final     Glucose   Date/Time Value Ref Range Status   01/11/2022 10:48 AM 223 (H) 70 - 100 mg/dL Final     Comment:     ADA guidelines for diabetes mellitus:  Fasting:  Equal to or greater than 126 mg/dL  Random:   Equal to or greater than 200 mg/dL       BUN   Date/Time Value Ref Range Status   01/11/2022 10:48 AM 31.0 (H) 7.0 - 21.0 mg/dL Final  Protein, Total   Date/Time Value Ref Range Status   01/11/2022 10:48 AM 7.3 6.0 - 8.3 g/dL Final     Alkaline Phosphatase   Date/Time Value Ref Range Status   01/11/2022 10:48 AM 88 37 - 117 U/L Final     AST (SGOT)   Date/Time Value Ref Range Status   01/11/2022 10:48 AM 21 5 - 41 U/L Final     ALT   Date/Time Value Ref Range Status   01/11/2022 10:48 AM 16 0 - 55 U/L Final     Anion Gap   Date/Time  Value Ref Range Status   01/11/2022 10:48 AM 9.0 5.0 - 15.0 Final     Comment:     Calculated AGAP = Na - (CL + CO2)  Interpret with caution; calculated AGAP may not  reflect patient's true clinical status.  This is a calculated value and platform-dependent.  A value >12.0 has been recommended for the management of  Hyperglycemic Crises: Diabetic Ketoacidosis and Hyperglycemic  Hyperosmolar State. Med Clin [place]. 2017;101(3):587-606.  doi:10.1016/j.mcna.2016.12.011         Lipid Panel   Cholesterol   Date/Time Value Ref Range Status   10/23/2018 09:41 AM 161 0 - 199 mg/dL Final     Triglycerides   Date/Time Value Ref Range Status   10/23/2018 09:41 AM 223 (H) 34 - 149 mg/dL Final     HDL   Date/Time Value Ref Range Status   10/23/2018 09:41 AM 42 40 - 9,999 mg/dL Final     Comment:     An HDL cholesterol <40 mg/dL is low and constitutes a  coronary heart disease risk factor, and HDL-C>59 mg/dL is  a negative risk factor for CHD.  Ref: American Heart Association; Circulation 2004         Coags:   PT   Date/Time Value Ref Range Status   05/19/2019 03:17 PM 13.7 12.6 - 15.0 sec Final     PT INR   Date/Time Value Ref Range Status   05/19/2019 03:17 PM 1.1 0.9 - 1.1 Final     Comment:     Recommended Ranges for Protime INR:    2.0-3.0 for most medical and surgical thromboembolic states    2.5-3.5 for artificial heart valves  INR result may not represent exact Warfarin dosing level during  the transition period from Heparin  to Warfarin therapy.  Results should be interpreted based on current anticoagulant  therapy and patient's clinical presentation.       PTT   Date/Time Value Ref Range Status   05/19/2019 03:17 PM 30 23 - 37 sec Final     Comment:     In vivo therapeutic range of heparin  (0.3 - 0.7 IU/mL)  correlate with the following APTT times: 64 - 102 seconds.  Results should be interpreted based on current anticoagulant  therapy and patient's clinical presentation.           Diagnostic Imaging   04/27/2023  Carotid Duplex performed in our vascular lab  1. The extracranial carotid arteries, proximal vertebral and proximal subclavian arteries were evaluated with high resolution imaging, color Doppler, and spectral Doppler techniques. Findings are as follows:  2. Widely patent right carotid endarterectomy.  3. There is some intimal thickening throughout the left carotid system. The peak systolic velocities and small amount of heterogeneous plaque formation at the left carotid bulb, which extends to the origin ofthe internal carotid artery, indicate a 1-49% stenosis.  4. The external carotid arteries are  widely patent.  5. Antegrade flow was in the vertebral arteries bilaterally.  6. The subclavian arteries are widely paten      Imaging reviewed with patient at today's visit  Assessment and Plan       1. Carotid stenosis, right  US  Carotid Duplex Dopp Comp Bilateral          78 y.o. female with a history of asymptomatic carotid stenosis and R CEA 02/22/2021 by Dr. Bade. She has been well and denies any TIA or CVA symptoms. Carotid duplex demonstrates a widely patent R CEA site without evidence of stenosis or development of intimal hyperplasia. The L ICA is also patent with <50% stenosis.       - Doing well from a vascular standpoint. L ICA has also remained stable throughout the years    - She should continue aspirin  and statin daily    - Advised we can lengthen follow up surveillance to annual basis at this time. If she wishes to continue 6 month surveillance, she will call for earlier appt    - F/u in 1 year with repeat carotid duplex    I hope this plan of care meets with your approval. Thank you for the opportunity to care for your patient.     Sincerely,     Alan Benders, MS, AGACNP-BC  [facility] Surgery  [address]  [place], [place] [address]  T [phone] F [phone]       [1]   Past Surgical History:  Procedure Laterality Date    COLONOSCOPY, DIAGNOSTIC (SCREENING)  2002     normal    EGD N/A 09/01/2018    Procedure: EGD;  Surgeon: Luke Jamaica, MD;  Location: [id number];  Service: Gastroenterology;  Laterality: N/A;    ENDARTERECTOMY, CAROTID ARTERY Right 02/22/2021    Procedure: RIGHT CAROTID ARTERY ENDARTERECTOMY WITH NEUROMONITORING;  Surgeon: Dyane Lennette LABOR, MD;  Location: [facility];  Service: Cardiovascular;  Laterality: Right;    EXPLORATORY LAPAROTOMY      EYE SURGERY      ICD IMPLANT BIV N/A 08/26/2018    Procedure: ICD IMPLANT BIV;  Surgeon: Katherleen Lamar CROME, MD;  Location: [facility];  Service: Cardiovascular;  Laterality: N/A;  medtronic    INSERT / REPLACE / REMOVE PACEMAKER      Difibrillator August 26, 2018    LHC W/ CORONARY ANGIOS AND LV Left 08/21/2018    Procedure: LHC W/ CORONARY ANGIOS AND LV;  Surgeon: Angelena Marsa MATSU, MD;  Location: [facility];  Service: Cardiovascular;  Laterality: Left;    SMALL INTESTINE SURGERY  01/2013   [2]   Family History  Problem Relation Name Age of Onset    Cancer Father Prentice A. Von Rembow 70        bladder    Diabetes Father Prentice A. Von Rembow     Heart attack Father Prentice A. Von Rembow    [3]   Social History  Tobacco Use    Smoking status: Never    Smokeless tobacco: Never   Vaping Use    Vaping status: Never Used   Substance Use Topics    Alcohol use: Not Currently     Alcohol/week: 1.0 standard drink of alcohol     Types: 1 Glasses of wine per week     Comment: rarely    Drug use: No   [4]   Allergies  Allergen Reactions    Shellfish-Derived Products Anaphylaxis   [  5]   Current Outpatient Medications:     apixaban  (Eliquis ) 2.5 MG, TAKE 1 TABLET EVERY 12 HOURS, Disp: 180 tablet, Rfl: 0    aspirin  EC 81 MG EC tablet, Take 1 tablet (81 mg) by mouth daily, Disp: , Rfl:     atorvastatin  (LIPITOR) 40 MG tablet, TAKE 2 TABLETS NIGHTLY, Disp: 180 tablet, Rfl: 0    carvedilol  (COREG ) 12.5 MG tablet, TAKE 1 TABLET TWICE A DAY WITH MEALS, Disp: 180 tablet, Rfl: 1    glimepiride  (AMARYL ) 2 MG tablet, Take 2 tablets (4 mg) by  mouth daily, Disp: , Rfl:     Jardiance 25 MG tablet, 1 tablet (25 mg) every morning, Disp: , Rfl:     Multiple Vitamins-Minerals (CENTRUM PO), Take by mouth daily, Disp: , Rfl:     sacubitril -valsartan  (Entresto ) 49-51 MG Tab per tablet, TAKE 1 TABLET TWICE A DAY, Disp: 180 tablet, Rfl: 1    semaglutide (Rybelsus) 7 MG tablet, Take 1 tablet (7 mg) by mouth daily, Disp: , Rfl:     vitamin B-12 (CYANOCOBALAMIN ) 100 MCG tablet, Take 1 tablet (100 mcg) by mouth daily, Disp: , Rfl:     metFORMIN  (GLUCOPHAGE ) 500 MG tablet, Take 1 tablet (500 mg total) by mouth 2 (two) times daily Morning and night (Patient not taking: Reported on 04/27/2023), Disp: , Rfl:     sacubitril -valsartan  (Entresto ) 49-51 MG Tab per tablet, Take 1 tablet by mouth 2 (two) times daily (Patient not taking: Reported on 04/27/2023), Disp: 28 tablet, Rfl: 0

## 2023-04-27 ENCOUNTER — Encounter (INDEPENDENT_AMBULATORY_CARE_PROVIDER_SITE_OTHER): Payer: Self-pay | Admitting: Acute Care

## 2023-04-27 ENCOUNTER — Ambulatory Visit (INDEPENDENT_AMBULATORY_CARE_PROVIDER_SITE_OTHER): Payer: Medicare Other

## 2023-04-27 ENCOUNTER — Ambulatory Visit (INDEPENDENT_AMBULATORY_CARE_PROVIDER_SITE_OTHER): Payer: Medicare Other | Admitting: Acute Care

## 2023-04-27 VITALS — BP 107/70 | HR 72

## 2023-04-27 DIAGNOSIS — I6521 Occlusion and stenosis of right carotid artery: Secondary | ICD-10-CM

## 2023-04-27 DIAGNOSIS — I6523 Occlusion and stenosis of bilateral carotid arteries: Secondary | ICD-10-CM

## 2023-05-01 ENCOUNTER — Other Ambulatory Visit (INDEPENDENT_AMBULATORY_CARE_PROVIDER_SITE_OTHER): Payer: Self-pay | Admitting: Cardiovascular Disease

## 2023-06-01 ENCOUNTER — Other Ambulatory Visit (INDEPENDENT_AMBULATORY_CARE_PROVIDER_SITE_OTHER): Payer: Self-pay | Admitting: Cardiovascular Disease

## 2023-06-03 DIAGNOSIS — Z95810 Presence of automatic (implantable) cardiac defibrillator: Secondary | ICD-10-CM

## 2023-06-03 DIAGNOSIS — I469 Cardiac arrest, cause unspecified: Secondary | ICD-10-CM

## 2023-06-03 LAB — REMOTE CARDIAC DEVICE MONITORING
AF Burden Percentage: 0
ICD Shock Recent Count: 0
RV Pacing Percentage: 1.6

## 2023-06-11 ENCOUNTER — Other Ambulatory Visit (INDEPENDENT_AMBULATORY_CARE_PROVIDER_SITE_OTHER): Payer: Self-pay | Admitting: Cardiovascular Disease

## 2023-06-11 DIAGNOSIS — I5042 Chronic combined systolic (congestive) and diastolic (congestive) heart failure: Secondary | ICD-10-CM

## 2023-06-29 ENCOUNTER — Other Ambulatory Visit (INDEPENDENT_AMBULATORY_CARE_PROVIDER_SITE_OTHER): Payer: Self-pay | Admitting: Cardiovascular Disease

## 2023-06-29 DIAGNOSIS — E7800 Pure hypercholesterolemia, unspecified: Secondary | ICD-10-CM

## 2023-06-29 NOTE — Telephone Encounter (Addendum)
 06/29/2023: Left message to call [facility] to clarify dose and schedule overdue appt.     07/02/2023: Confirmed dose with patient--states she takes Atorvastatin  80 mg, but needs the 40 mg tablets due to swallowing difficulty. Warm transfer to [facility], PSS to schedule appt

## 2023-07-06 ENCOUNTER — Other Ambulatory Visit (INDEPENDENT_AMBULATORY_CARE_PROVIDER_SITE_OTHER): Payer: Self-pay | Admitting: Cardiovascular Disease

## 2023-07-30 ENCOUNTER — Other Ambulatory Visit (INDEPENDENT_AMBULATORY_CARE_PROVIDER_SITE_OTHER): Payer: Self-pay | Admitting: Cardiovascular Disease

## 2023-08-23 ENCOUNTER — Encounter (INDEPENDENT_AMBULATORY_CARE_PROVIDER_SITE_OTHER): Payer: Self-pay

## 2023-08-24 ENCOUNTER — Telehealth (INDEPENDENT_AMBULATORY_CARE_PROVIDER_SITE_OTHER): Payer: Self-pay

## 2023-08-24 ENCOUNTER — Encounter (INDEPENDENT_AMBULATORY_CARE_PROVIDER_SITE_OTHER): Payer: Self-pay | Admitting: Cardiovascular Disease

## 2023-08-24 NOTE — Telephone Encounter (Addendum)
 Called patient and notified her of recent event on transmission report below. Patient v/u and denies symptoms at the time of the event. Scheduled her annual IOC. Transferred call to [facility] front desk to schedule echocardiogram.     ----- Message from Lynwood JONELLE Savoy, [place] sent at 08/23/2023 12:26 PM EDT -----  Regarding: RE: Reason for Docket: Routine + Red for Polymorphic NSVT -Longest: ~2.5 sec with V rate 250 bpm on 08/19/23 (11:21)    Thanks, reviewed and agree PVC triggered short NS PMVT events.  No therapies triggered or delivered.  She's overdue for echo and needs IOC setup so please ensure those get put on the books.  Otherwise nothing to do.Thanks JOPA

## 2023-08-25 ENCOUNTER — Other Ambulatory Visit (INDEPENDENT_AMBULATORY_CARE_PROVIDER_SITE_OTHER): Admitting: Cardiovascular Disease

## 2023-08-28 ENCOUNTER — Ambulatory Visit (INDEPENDENT_AMBULATORY_CARE_PROVIDER_SITE_OTHER): Admitting: Cardiovascular Disease

## 2023-09-02 DIAGNOSIS — Z95810 Presence of automatic (implantable) cardiac defibrillator: Secondary | ICD-10-CM

## 2023-09-02 DIAGNOSIS — I469 Cardiac arrest, cause unspecified: Secondary | ICD-10-CM

## 2023-09-02 LAB — REMOTE CARDIAC DEVICE MONITORING
AF Burden Percentage: 0
ICD Shock Recent Count: 0

## 2023-09-12 ENCOUNTER — Ambulatory Visit (INDEPENDENT_AMBULATORY_CARE_PROVIDER_SITE_OTHER): Admitting: Student in an Organized Health Care Education/Training Program

## 2023-10-01 ENCOUNTER — Other Ambulatory Visit (INDEPENDENT_AMBULATORY_CARE_PROVIDER_SITE_OTHER): Payer: Self-pay | Admitting: Cardiovascular Disease

## 2023-10-01 DIAGNOSIS — E7800 Pure hypercholesterolemia, unspecified: Secondary | ICD-10-CM

## 2023-10-04 ENCOUNTER — Other Ambulatory Visit (INDEPENDENT_AMBULATORY_CARE_PROVIDER_SITE_OTHER): Payer: Self-pay | Admitting: Cardiovascular Disease

## 2023-10-04 NOTE — Telephone Encounter (Signed)
 10/04/2023: Clarified with Dr Altamease whether prescription should cont to be refilled through [facility], Per Dr Altamease, rec refill through hematologist. Called and spoke with Dr Altamease. Patient currently being seen by Dr Dyane @ [address] but she is leaving and is not assigned another provider. However, has 2 months of refills left. Does not need refill at this time. Will select new hematologist and ask them to send refill when needed.

## 2023-10-19 ENCOUNTER — Ambulatory Visit
Admission: RE | Admit: 2023-10-19 | Discharge: 2023-10-19 | Disposition: A | Discharge status: Home / Self Care | Source: Ambulatory Visit | Attending: Cardiovascular Disease | Admitting: Cardiovascular Disease

## 2023-10-19 DIAGNOSIS — I5042 Chronic combined systolic (congestive) and diastolic (congestive) heart failure: Secondary | ICD-10-CM | POA: Insufficient documentation

## 2023-10-21 ENCOUNTER — Encounter (INDEPENDENT_AMBULATORY_CARE_PROVIDER_SITE_OTHER): Payer: Self-pay | Admitting: Fellow

## 2023-10-21 LAB — ECHO ADULT TTE COMPLETE
AV Area (Cont Eq VTI): 2.0648
AV Mean Gradient: 4
AV Peak Velocity: 1.29
Ao Root Diameter (2D): 3.4
BP Mod LV Ejection Fraction: 58
IVS Diastolic Thickness (2D): 0.8
LA Dimension (2D): 3.6
LA Volume Index (BP A-L): 28.2747
LVID diastole (2D): 4
LVID systole (2D): 2.8
MV E/A: 0.7
MV E/e' (Average): 8.9492
Mitral Valve Findings: NORMAL
Prox Ascending Aorta Diameter: 3.2
RV Basal Diastolic Dimension: 3.7
RV Systolic Pressure: 20.64
TAPSE: 1.8
Tricuspid Valve Findings: NORMAL

## 2023-10-26 ENCOUNTER — Encounter (INDEPENDENT_AMBULATORY_CARE_PROVIDER_SITE_OTHER): Payer: Self-pay

## 2023-10-26 ENCOUNTER — Ambulatory Visit (INDEPENDENT_AMBULATORY_CARE_PROVIDER_SITE_OTHER): Admitting: Cardiovascular Disease

## 2023-10-26 ENCOUNTER — Other Ambulatory Visit: Payer: Self-pay

## 2023-10-26 ENCOUNTER — Encounter (INDEPENDENT_AMBULATORY_CARE_PROVIDER_SITE_OTHER): Payer: Self-pay | Admitting: Cardiovascular Disease

## 2023-10-26 ENCOUNTER — Telehealth (INDEPENDENT_AMBULATORY_CARE_PROVIDER_SITE_OTHER): Payer: Self-pay

## 2023-10-26 ENCOUNTER — Telehealth: Payer: Self-pay

## 2023-10-26 VITALS — BP 122/74 | HR 78 | Ht 64.0 in | Wt 130.0 lb

## 2023-10-26 DIAGNOSIS — Z8679 Personal history of other diseases of the circulatory system: Secondary | ICD-10-CM

## 2023-10-26 DIAGNOSIS — Z86718 Personal history of other venous thrombosis and embolism: Secondary | ICD-10-CM

## 2023-10-26 DIAGNOSIS — I5042 Chronic combined systolic (congestive) and diastolic (congestive) heart failure: Secondary | ICD-10-CM

## 2023-10-26 DIAGNOSIS — E7800 Pure hypercholesterolemia, unspecified: Secondary | ICD-10-CM

## 2023-10-26 DIAGNOSIS — E538 Deficiency of other specified B group vitamins: Secondary | ICD-10-CM

## 2023-10-26 DIAGNOSIS — Z8673 Personal history of transient ischemic attack (TIA), and cerebral infarction without residual deficits: Secondary | ICD-10-CM

## 2023-10-26 DIAGNOSIS — D509 Iron deficiency anemia, unspecified: Secondary | ICD-10-CM

## 2023-10-26 LAB — ECG 12-LEAD
Atrial Rate: 78 {beats}/min
P Axis: 74 degrees
P-R Interval: 198 ms
Q-T Interval: 400 ms
QRS Duration: 98 ms
QTC Calculation (Bezet): 456 ms
R Axis: 42 degrees
T Axis: 59 degrees
Ventricular Rate: 78 {beats}/min

## 2023-10-26 MED ORDER — SACUBITRIL-VALSARTAN 49-51 MG PO TABS
1.0000 | ORAL_TABLET | Freq: Two times a day (BID) | ORAL | 3 refills | Status: DC
Start: 2023-10-26 — End: 2024-01-31

## 2023-10-26 MED ORDER — APIXABAN 2.5 MG PO TABS
2.5000 mg | ORAL_TABLET | Freq: Two times a day (BID) | ORAL | 0 refills | Status: DC
Start: 2023-10-26 — End: 2024-01-10

## 2023-10-26 MED ORDER — ATORVASTATIN CALCIUM 40 MG PO TABS
80.0000 mg | ORAL_TABLET | Freq: Every day | ORAL | 3 refills | Status: AC
Start: 2023-10-26 — End: ?

## 2023-10-26 MED ORDER — CARVEDILOL 12.5 MG PO TABS
12.5000 mg | ORAL_TABLET | Freq: Two times a day (BID) | ORAL | 3 refills | Status: AC
Start: 2023-10-26 — End: ?

## 2023-10-26 NOTE — Progress Notes (Signed)
 [place]  HEART CARDIOLOGY OFFICE PROGRESS NOTE    HRT [facility] [place] [facility] OFFICE      [place]  HEART [facility] OFFICE -CARDIOLOGY  [address]  [place] [address]  Dept: [phone]  Dept Fax: [phone]       Patient Name: Tara Perry    Date of Visit:  October 26, 2023  Date of Birth: 31-Dec-1945  AGE: 78 y.o.  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 female who presents today for management of chronic systolic CHF, prior VF arrest, and carotid disease.  Since her last cardiology office visit in February 2024, she underwent a carotid duplex study on 04/27/2023 showed a widely patent right carotid endarterectomy and 1-49% left ICA.  Echo done more recently on 10/19/2023 notable for LVEF 58% with mild AI/MR, no pulmonary hypertension, and EF has actually normalized compared to her last echo.  She is feeling well, denies any chest pain, shortness of breath, paroxysmal nocturnal dyspnea, orthopnea, or leg edema.      PAST MEDICAL HISTORY: She has a past medical history of Anemia (08/2018), Atrophic vaginitis (08/30/2018), Bilateral cataracts, Cardiac arrest (CMS/HCC) (08/17/2018), Chronic vulvitis (08/30/2018), CKD (chronic kidney disease), stage III (CMS/HCC), Congestive heart failure (CMS/HCC), Defibrillator discharge, Detached retina, right (08/24/2018), Diabetes mellitus (1992), Encounter for blood transfusion, Gout spondylitis, Gout synovitis, Heart disease (May 2008), Hyperlipidemia (June 2020), Hypertension, LBBB (left bundle branch block), NICM (nonischemic cardiomyopathy) (CMS/HCC) (08/2018), Renal insufficiency, Respiratory failure (CMS/HCC) (08/2018), Type 2 diabetes mellitus, controlled (CMS/HCC), Viral cardiomyopathy (CMS/HCC) (2008), and VTE (venous thromboembolism) (June 2020). She has a past surgical history that  includes COLONOSCOPY, DIAGNOSTIC (SCREENING) (2002); Exploratory laparotomy; LHC w/ Coronary Angios and LV (Left, 08/21/2018); ICD Implant BiV (N/A, 08/26/2018); EGD (N/A, 09/01/2018); Eye surgery; Small intestine surgery (01/2013); Insert / replace / remove pacemaker; ENDARTERECTOMY, CAROTID ARTERY (Right, 02/22/2021); Other surgical history (Fall 2014); Upper gastrointestinal endoscopy ([id number]); and Carotid endarterectomy.    ALLERGIES:   Allergies   Allergen Reactions    Shellfish-Derived Products Anaphylaxis       MEDICATIONS:   Current Outpatient Medications:     apixaban  (Eliquis ) 2.5 MG, Take 1 tablet (2.5 mg) by mouth every 12 (twelve) hours, Disp: 180 tablet, Rfl: 0    aspirin  EC 81 MG EC tablet, Take 1 tablet (81 mg) by mouth daily, Disp: , Rfl:     glimepiride  (AMARYL ) 2 MG tablet, Take 2 tablets (4 mg) by mouth daily, Disp: , Rfl:     Jardiance 25 MG tablet, 1 tablet (25 mg) every morning, Disp: , Rfl:     Multiple Vitamins-Minerals (CENTRUM PO), Take by mouth daily, Disp: , Rfl:     semaglutide (Rybelsus) 7 MG tablet, Take 1 tablet (7 mg) by mouth daily, Disp: , Rfl:     vitamin B-12 (CYANOCOBALAMIN ) 100 MCG tablet, Take 1 tablet (100 mcg) by mouth daily, Disp: , Rfl:     atorvastatin  (LIPITOR) 40 MG tablet, Take 2 tablets (80 mg) by mouth once daily, Disp: 180 tablet, Rfl: 3    carvedilol  (COREG ) 12.5 MG tablet, Take 1 tablet (12.5 mg) by mouth 2 (two) times daily with meals Further refills at appointment, Disp: 180 tablet, Rfl: 3    sacubitril -valsartan  (Entresto ) 49-51 MG Tablet per tablet, Take 1 tablet by mouth 2 (two) times daily, Disp:  180 tablet, Rfl: 3     FAMILY HISTORY: family history includes Cancer (age of onset: 64) in her father; Diabetes in her father; Heart attack in her father.    SOCIAL HISTORY: She reports that she has never smoked. She has never used smokeless tobacco. She reports that she does not currently use alcohol after a past usage of about 1.0 standard drink of alcohol per  week. She reports that she does not use drugs.    PHYSICAL EXAMINATION    Visit Vitals  BP 122/74 (BP Site: Left arm, Patient Position: Sitting, Cuff Size: Medium)   Pulse 78   Ht 1.626 m (5' 4)   Wt 59 kg (130 lb)   BMI 22.31 kg/m       General Appearance:  A well-appearing female in no acute distress.    Skin: Warm and dry to touch   Head: Normocephalic   Eyes: EOMS Intact   ZWU:[id number]  Neck: JVP normal, no carotid bruit    Chest: Clear to auscultation bilaterally   Cardiovascular: Regular rhythm no murm   Abdomen: no guarding  Extremities: Warm without edema.   Neuro: Alert and oriented x3.        ECG: AV paced      LABS:   Lab Results   Component Value Date    WBC 8.01 11/21/2022    HGB 12.9 11/21/2022    HCT 39.0 11/21/2022    PLT 179 11/21/2022     Lab Results   Component Value Date    GLU 223 (H) 01/11/2022    BUN 31.0 (H) 01/11/2022    CREAT 1.5 (H) 01/11/2022    NA 139 01/11/2022    K 4.6 01/11/2022    CL 108 01/11/2022    CO2 22 01/11/2022    AST 21 01/11/2022    ALT 16 01/11/2022     Lab Results   Component Value Date    MG 2.0 02/23/2021    TSH 1.06 09/13/2012    HGBA1C 8.7 (H) 02/16/2021    BNP 139 (H) 08/16/2018     Lab Results   Component Value Date    CHOL 161 10/23/2018    TRIG 223 (H) 10/23/2018    HDL 42 10/23/2018    LDL 74 10/23/2018          IMPRESSION:   [name] is a 78 y.o. female with the following problems:     Chronic systolic heart failure, NYHA class II, euvolemic and well compensated  Echocardiogram 08/19/2018: EF 20%, dyskinesis of the basal/mid anterior, anteroseptal, and inferoseptal LV, moderate hypokinesis of the basal inferior wall, normal RV function, down from 40% on October 2017 echo  Echocardiogram 11/05/2018: EF 40%  Echocardiogram 10/19/2023: LVEF 58%, mild MR/AI  Nonischemic cardiomyopathy, s/p cardiac catheterization 08/21/2018 with no CAD  Prior intolerance to spironolactone /MRAs  Prior ventricular fibrillation arrest, witnessed, hospitalized 08/16/2018 through 08/27/2018,  successfully resuscitated by EMS, intubated, underwent successful therapeutic hypothermia protocol then extubated, s/p biventricular AICD placed 08/26/2018  Prior left upper extremity DVT requiring hospitalization 08/30/2018 through 09/08/2018 with anticoagulation at that time complicated by hematemesis requiring endoscopy showing nonbleeding duodenal ulcer treated with epinephrine  and cautery/clipping  Hypertension, controlled   Hypercholesterolemia, on statin therapy  Chronic left bundle branch block  Type 2 diabetes  Chronic kidney disease  Bilateral carotid artery disease  Carotid duplex study 03/08/2018: Bilateral 50 to 69% ICA disease  Carotid duplex 05/07/2019: 70-99% right ICA, 50 to 69% left ICA  Possible symptomatic TIA  05/19/2019 (left facial numbness leading to ER visit)  MRA 06/16/2019: 75% right ICA, 40% left ICA  Carotid duplex 12/15/2019: 70-99% right ICA, 50 to 69% left ICA        RECOMMENDATIONS:     Reviewed her reassuring cardiac echo results, showed her EF is normalized to 58%, continue Entresto , carvedilol , Jardiance for guideline directed heart failure therapy.  We discussed that GDMT should be continued after normalization of LV function to reduce the risk of future LV dysfunction.  She gets her lipids through her primary, goal LDL well under 70, continue atorvastatin  40  Lipoprotein (a)  Follow-up in 1 years time                                                     Orders Placed This Encounter   Procedures    Lipoprotein A (LPA)    ECG 12 lead (Normal)    Office Visit ([facility]RT [place])       Orders Placed This Encounter   Medications    atorvastatin  (LIPITOR) 40 MG tablet     Sig: Take 2 tablets (80 mg) by mouth once daily     Dispense:  180 tablet     Refill:  3     10/01/2023: Patient prefers two  40 mg tablets instead of one 80 mg tablet due to size of 80 mg tablet (too difficult to swallow)    carvedilol  (COREG ) 12.5 MG tablet     Sig: Take 1 tablet (12.5 mg) by mouth 2 (two) times daily with meals Further  refills at appointment     Dispense:  180 tablet     Refill:  3    sacubitril -valsartan  (Entresto ) 49-51 MG Tablet per tablet     Sig: Take 1 tablet by mouth 2 (two) times daily     Dispense:  180 tablet     Refill:  3       SIGNED:    Cosette CHRISTELLA Bass, MD          This note was generated by the Dragon speech recognition and may contain errors or omissions not intended by the user. Grammatical errors, random word insertions, deletions, pronoun errors, and incomplete sentences are occasional consequences of this technology due to software limitations. Not all errors are caught or corrected. If there are questions or concerns about the content of this note or information contained within the body of this dictation, they should be addressed directly with the author for clarification.

## 2023-10-26 NOTE — Telephone Encounter (Signed)
 Spoke with nurse from [facility] regarding Tara Perry's Eliquis  refill and hematology referral. Nurse confirmed refills will be provided and she will contact the patient to schedule an appointment with a new hematologist. Attempted to call [name] to relay this update; no answer. [facility].

## 2023-10-26 NOTE — Progress Notes (Signed)
 Labs scanned into patient's chart and routed to Dr Altamease.

## 2023-10-26 NOTE — Telephone Encounter (Signed)
 Received secure chat from MA at [facility] stating pt requesting to be scheduled for f/u and is due for Eliquis  refill. Per MA, no new issues noted. This RN reviewed chart -- pt last seen 11/2022 by Dr. Dyane and was recommended RTC 1 year. Pt seen for hx DVT, Fe def, and B-12 def, and was advised to remain on Eliquis  2.5 mg BID indefinitely. RN sent 3 mo supply of Eliquis  to pt's preferred pharmacy, and encounter routed to patient access team for assistance scheduling f/u with NP Pam in Oct. Can be established with new MD afterwards

## 2023-10-26 NOTE — Progress Notes (Signed)
 The prescription has been received by the Pharmacy Care Team. A benefit investigation is currently in process.    To follow up on the outcome of the benefit investigation, please check Episodes, Referrals or specialty pharmacy encounter section in the Patient chart review

## 2023-10-30 NOTE — Telephone Encounter (Signed)
 Attempt #2: Sent mcm and LDMTCB to assist with scheduling in Oct with NP Pam.

## 2023-10-30 NOTE — Telephone Encounter (Signed)
 Pt called clinic and we scheduled pt for 10/14 at 10AM with NP Pam.

## 2023-11-19 ENCOUNTER — Ambulatory Visit (INDEPENDENT_AMBULATORY_CARE_PROVIDER_SITE_OTHER)

## 2023-11-19 DIAGNOSIS — Z4502 Encounter for adjustment and management of automatic implantable cardiac defibrillator: Secondary | ICD-10-CM

## 2023-11-23 ENCOUNTER — Other Ambulatory Visit (INDEPENDENT_AMBULATORY_CARE_PROVIDER_SITE_OTHER): Payer: Self-pay | Admitting: Cardiovascular Disease

## 2023-11-26 ENCOUNTER — Other Ambulatory Visit (INDEPENDENT_AMBULATORY_CARE_PROVIDER_SITE_OTHER): Payer: Self-pay | Admitting: Clinical Cardiac Electrophysiology

## 2023-11-26 LAB — IN OFFICE CARDIAC DEVICE MONITORING: ICD Shock Recent Count: 0

## 2023-12-01 ENCOUNTER — Other Ambulatory Visit (INDEPENDENT_AMBULATORY_CARE_PROVIDER_SITE_OTHER): Payer: Self-pay | Admitting: Family Medicine

## 2023-12-02 DIAGNOSIS — I469 Cardiac arrest, cause unspecified: Secondary | ICD-10-CM

## 2023-12-02 DIAGNOSIS — Z95810 Presence of automatic (implantable) cardiac defibrillator: Secondary | ICD-10-CM

## 2023-12-02 LAB — REMOTE CARDIAC DEVICE MONITORING
AF Burden Percentage: 0
ICD Shock Recent Count: 0

## 2023-12-25 ENCOUNTER — Ambulatory Visit

## 2024-01-07 ENCOUNTER — Other Ambulatory Visit: Payer: Self-pay

## 2024-01-07 DIAGNOSIS — Z86718 Personal history of other venous thrombosis and embolism: Secondary | ICD-10-CM

## 2024-01-10 ENCOUNTER — Other Ambulatory Visit: Payer: Self-pay

## 2024-01-31 ENCOUNTER — Other Ambulatory Visit (INDEPENDENT_AMBULATORY_CARE_PROVIDER_SITE_OTHER): Payer: Self-pay

## 2024-01-31 ENCOUNTER — Other Ambulatory Visit (INDEPENDENT_AMBULATORY_CARE_PROVIDER_SITE_OTHER): Payer: Self-pay | Admitting: Cardiovascular Disease

## 2024-01-31 DIAGNOSIS — I5042 Chronic combined systolic (congestive) and diastolic (congestive) heart failure: Secondary | ICD-10-CM

## 2024-01-31 MED ORDER — SACUBITRIL-VALSARTAN 49-51 MG PO TABS
1.0000 | ORAL_TABLET | Freq: Two times a day (BID) | ORAL | 0 refills | Status: AC
Start: 2024-01-31 — End: ?

## 2024-01-31 MED ORDER — SACUBITRIL-VALSARTAN 49-51 MG PO TABS
1.0000 | ORAL_TABLET | Freq: Two times a day (BID) | ORAL | 3 refills | Status: AC
Start: 2024-01-31 — End: ?

## 2024-01-31 NOTE — Telephone Encounter (Signed)
 Pt called into [facility] as the pt contacted [facility] for refill of Entresto  though was notified rx was canceled. Relayed it was sent to local [facility] on 10/26/23. Pt would like rx to go to mail order. Resent. Short term sent to local pharmacy to refill. Pt v/u.

## 2024-02-08 ENCOUNTER — Ambulatory Visit

## 2024-02-21 ENCOUNTER — Other Ambulatory Visit (INDEPENDENT_AMBULATORY_CARE_PROVIDER_SITE_OTHER): Payer: Self-pay | Admitting: Cardiovascular Disease

## 2024-03-02 DIAGNOSIS — I469 Cardiac arrest, cause unspecified: Secondary | ICD-10-CM

## 2024-03-02 DIAGNOSIS — Z95810 Presence of automatic (implantable) cardiac defibrillator: Secondary | ICD-10-CM

## 2024-03-02 LAB — REMOTE CARDIAC DEVICE MONITORING
AF Burden Percentage: 0
ICD Shock Recent Count: 0

## 2024-03-24 ENCOUNTER — Ambulatory Visit

## 2024-04-08 NOTE — Progress Notes (Unsigned)
 OFFICE VISIT  Vascular and Endovascular Surgery    Date: April 08, 2024  Chief Complaint:  No chief complaint on file.    History of Present Illness     Tara Perry is a 79 y.o. female with a history of asymptomatic carotid stenosis s/p R CEA 02/2021 by Dr. Bade. She presents in annual follow-up with carotid duplex performed in our vascular lab today. She presents today in consultation with her son, [name].    Today, she reports she is doing well. She denies any recent hospital stays or surgeries. She also   denies any recent symptoms of lateralizing neurologic ischemia including stroke, TIA, monocular visual changes, or amaurosis fugax.     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  Other past medical history includes diabetes mellitus, gout and cardiomyopathy. She has history of DVT in the left upper extremity after its placement and has been on Eliquis  therapy, managed by hematology.    She is compliant with Aspirin  81mg , Atorvastatin  40mg , and Eliquis  2.5mg  BID.    Past Medical History     Past Medical History:   Diagnosis Date    Anemia 08/2018    Atrophic vaginitis 08/30/2018    Bilateral cataracts     I have implants    Cardiac arrest (CMS/HCC) 08/17/2018    VF arrest. She received 3 shocks, 1 round of epi with successful ROSC. 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    CKD (chronic kidney disease), stage III (CMS/HCC)     Congestive heart failure (CMS/HCC)     Defibrillator discharge     Detached retina, right 08/24/2018    per patient, he only sees peripheral view    Diabetes mellitus 1992    Encounter for blood transfusion     unsure of dates    Gout spondylitis     Gout synovitis     Heart disease May 2008    Hyperlipidemia June 2020    Hypertension     LBBB (left bundle branch block)     LBBB chronic    NICM (nonischemic cardiomyopathy) (CMS/HCC) 08/2018    NICM, EF 20%    Renal insufficiency     Respiratory failure (CMS/HCC) 08/2018     Hypoxic/anoxic respiratory failure, extubated 08/20/2018    Type 2 diabetes mellitus, controlled (CMS/HCC)     Viral cardiomyopathy (CMS/HCC) 2008    VTE (venous thromboembolism) June 2020       Past Surgical History   Past Surgical History[1]    Family History    Family History[2]    Social Histroy   Social History[3]    Allergies   Allergies[4]    Medications   Current Medications[5]    Review of Systems   A comprehensive twelve point review of systems was: Negative in the neurologic, respiratory, cardiac, vascular, gastrointestinal, genitourinary, musculoskeletal, immunologic, psychiatric or endocrinologic systems except as mentioned above in HPI.       Physical Exam     There were no vitals filed for this visit.      There is no height or weight on file to calculate BMI.    General:  Patient appears their stated age, well-nourished.  Alert and in no apparent distress. R CEA well healed.   Cardiac: RRR, carotid bruits not present, no JVD. Extremities warm, pulses: radial rt 2+ radial lt 2+,  Zku:Qloo ROM, symmetric  Skin: Color appropriate for race, Skin  warm, dry, gangreneNo  Neuro: CN II-XII intact, gross motor and sensory intact  Psych: AAOX3, appropriate mood and affect.    Labs   CBC:   WBC   Date/Time Value Ref Range Status   11/21/2022 10:41 AM 8.01 3.10 - 9.50 x10 3/uL Final   01/11/2022 10:48 AM 8.92 3.10 - 9.50 x10 3/uL Final   08/03/2006 05:00 AM 14.1 (H) 3.5 - 10.8 /CUMM Final     RBC   Date/Time Value Ref Range Status   11/21/2022 10:41 AM 4.34 3.90 - 5.10 x10 6/uL Final   01/11/2022 10:48 AM 4.36 3.90 - 5.10 x10 6/uL Final     Hemoglobin   Date/Time Value Ref Range Status   11/21/2022 10:41 AM 12.9 11.4 - 14.8 g/dL Final   92/69/7979 88:90 AM 10.2 (L) 11.1 - 15.9 g/dL Final     Hgb   Date/Time Value Ref Range Status   01/11/2022 10:48 AM 13.1 11.4 - 14.8 g/dL Final     Hematocrit   Date/Time Value Ref Range Status   11/21/2022 10:41 AM 39.0 34.7 - 43.7 % Final   01/11/2022 10:48 AM 39.8 34.7 - 43.7 %  Final     MCV   Date/Time Value Ref Range Status   11/21/2022 10:41 AM 89.9 78.0 - 96.0 fL Final   01/11/2022 10:48 AM 91.3 78.0 - 96.0 fL Final     MCHC   Date/Time Value Ref Range Status   11/21/2022 10:41 AM 33.1 31.5 - 35.8 g/dL Final   88/98/7976 89:51 AM 32.9 31.5 - 35.8 g/dL Final     RDW   Date/Time Value Ref Range Status   11/21/2022 10:41 AM 12 11 - 15 % Final   01/11/2022 10:48 AM 12 11 - 15 % Final     Platelet Count   Date/Time Value Ref Range Status   11/21/2022 10:41 AM 179 142 - 346 x10 3/uL Final     Platelets   Date/Time Value Ref Range Status   01/11/2022 10:48 AM 224 142 - 346 x10 3/uL Final   10/10/2018 11:09 AM 269 150 - 450 x10E3/uL Final     CMP:   Sodium   Date/Time Value Ref Range Status   01/11/2022 10:48 AM 139 135 - 145 mEq/L Final     Potassium   Date/Time Value Ref Range Status   01/11/2022 10:48 AM 4.6 3.5 - 5.3 mEq/L Final     Comment:     Interpret with caution. Potassium may be falsely elevated due to the high  hemolysis index. Recollect if clinically indicated.       Chloride   Date/Time Value Ref Range Status   01/11/2022 10:48 AM 108 99 - 111 mEq/L Final     CO2   Date/Time Value Ref Range Status   01/11/2022 10:48 AM 22 17 - 29 mEq/L Final     Glucose   Date/Time Value Ref Range Status   01/11/2022 10:48 AM 223 (H) 70 - 100 mg/dL Final     Comment:     ADA guidelines for diabetes mellitus:  Fasting:  Equal to or greater than 126 mg/dL  Random:   Equal to or greater than 200 mg/dL       BUN   Date/Time Value Ref Range Status   01/11/2022 10:48 AM 31.0 (H) 7.0 - 21.0 mg/dL Final     Protein, Total   Date/Time Value Ref Range Status   01/11/2022 10:48 AM 7.3 6.0 - 8.3 g/dL Final  Alkaline Phosphatase   Date/Time Value Ref Range Status   01/11/2022 10:48 AM 88 37 - 117 U/L Final     AST (SGOT)   Date/Time Value Ref Range Status   01/11/2022 10:48 AM 21 5 - 41 U/L Final     ALT   Date/Time Value Ref Range Status   01/11/2022 10:48 AM 16 0 - 55 U/L Final     Anion Gap   Date/Time  Value Ref Range Status   01/11/2022 10:48 AM 9.0 5.0 - 15.0 Final     Comment:     Calculated AGAP = Na - (CL + CO2)  Interpret with caution; calculated AGAP may not  reflect patient's true clinical status.  This is a calculated value and platform-dependent.  A value >12.0 has been recommended for the management of  Hyperglycemic Crises: Diabetic Ketoacidosis and Hyperglycemic  Hyperosmolar State. Med Clin [place]. 2017;101(3):587-606.  doi:10.1016/j.mcna.2016.12.011       Lipid Panel   Cholesterol   Date/Time Value Ref Range Status   10/23/2018 09:41 AM 161 0 - 199 mg/dL Final     Triglycerides   Date/Time Value Ref Range Status   10/23/2018 09:41 AM 223 (H) 34 - 149 mg/dL Final     HDL   Date/Time Value Ref Range Status   10/23/2018 09:41 AM 42 40 - 9,999 mg/dL Final     Comment:     An HDL cholesterol <40 mg/dL is low and constitutes a  coronary heart disease risk factor, and HDL-C>59 mg/dL is  a negative risk factor for CHD.  Ref: American Heart Association; Circulation 2004         Coags:   PT   Date/Time Value Ref Range Status   05/19/2019 03:17 PM 13.7 12.6 - 15.0 sec Final     PT INR   Date/Time Value Ref Range Status   05/19/2019 03:17 PM 1.1 0.9 - 1.1 Final     Comment:     Recommended Ranges for Protime INR:    2.0-3.0 for most medical and surgical thromboembolic states    2.5-3.5 for artificial heart valves  INR result may not represent exact Warfarin dosing level during  the transition period from Heparin  to Warfarin therapy.  Results should be interpreted based on current anticoagulant  therapy and patient's clinical presentation.       PTT   Date/Time Value Ref Range Status   05/19/2019 03:17 PM 30 23 - 37 sec Final     Comment:     In vivo therapeutic range of heparin  (0.3 - 0.7 IU/mL)  correlate with the following APTT times: 64 - 102 seconds.  Results should be interpreted based on current anticoagulant  therapy and patient's clinical presentation.       Diagnostic Imaging   04/21/2024 Carotid  Duplex performed in our vascular lab:  ***    04/27/2023 Carotid Duplex performed in our vascular lab:  1. The extracranial carotid arteries, proximal vertebral and proximal subclavian arteries were evaluated with high resolution imaging, color Doppler, and spectral Doppler techniques. Findings are as follows:  2. Widely patent right carotid endarterectomy.  3. There is some intimal thickening throughout the left carotid system. The peak systolic velocities and small amount of heterogeneous plaque formation at the left carotid bulb, which extends to the origin ofthe internal carotid artery, indicate a 1-49% stenosis.  4. The external carotid arteries are widely patent.  5. Antegrade flow was in the vertebral arteries bilaterally.  6. The subclavian arteries  are widely paten    Imaging reviewed with patient at today's visit  Assessment and Plan       ***         [1]   Past Surgical History:  Procedure Laterality Date    CAROTID ENDARTERECTOMY      COLONOSCOPY, DIAGNOSTIC (SCREENING)  2002    normal    EGD N/A 09/01/2018    Procedure: EGD;  Surgeon: Luke Jamaica, MD;  Location: [id number];  Service: Gastroenterology;  Laterality: N/A;    ENDARTERECTOMY, CAROTID ARTERY Right 02/22/2021    Procedure: RIGHT CAROTID ARTERY ENDARTERECTOMY WITH NEUROMONITORING;  Surgeon: Dyane Lennette LABOR, MD;  Location: [facility];  Service: Cardiovascular;  Laterality: Right;    EXPLORATORY LAPAROTOMY      EYE SURGERY      ICD IMPLANT BIV N/A 08/26/2018    Procedure: ICD IMPLANT BIV;  Surgeon: Katherleen Lamar CROME, MD;  Location: [facility];  Service: Cardiovascular;  Laterality: N/A;  medtronic    INSERT / REPLACE / REMOVE PACEMAKER      Difibrillator August 26, 2018    LHC W/ CORONARY ANGIOS AND LV Left 08/21/2018    Procedure: LHC W/ CORONARY ANGIOS AND LV;  Surgeon: Angelena Marsa MATSU, MD;  Location: [facility];  Service: Cardiovascular;  Laterality: Left;    OTHER SURGICAL HISTORY  Fall 2014    Exploratory - twited  piece of  small intestine    SMALL INTESTINE SURGERY  01/2013    UPPER GASTROINTESTINAL ENDOSCOPY  June2020   [2]   Family History  Problem Relation Name Age of Onset    Cancer Father Prentice A. Von Rembow 70        bladder    Diabetes Father Prentice A. Von Rembow     Heart attack Father Prentice A. Von Rembow    [3]   Social History  Tobacco Use    Smoking status: Never    Smokeless tobacco: Never   Vaping Use    Vaping status: Never Used   Substance Use Topics    Alcohol use: Not Currently     Alcohol/week: 1.0 standard drink of alcohol     Comment: rarely    Drug use: Never   [4]   Allergies  Allergen Reactions    Shellfish Protein-Containing Drug Products Anaphylaxis   [5]   Current Outpatient Medications:     aspirin  EC 81 MG EC tablet, Take 1 tablet (81 mg) by mouth daily, Disp: , Rfl:     atorvastatin  (LIPITOR) 40 MG tablet, Take 2 tablets (80 mg) by mouth once daily, Disp: 180 tablet, Rfl: 3    carvedilol  (COREG ) 12.5 MG tablet, Take 1 tablet (12.5 mg) by mouth 2 (two) times daily with meals Further refills at appointment, Disp: 180 tablet, Rfl: 3    Eliquis  2.5 MG tablet, TAKE 1 TABLET EVERY 12 HOURS, Disp: 60 tablet, Rfl: 0    glimepiride  (AMARYL ) 2 MG tablet, Take 2 tablets (4 mg) by mouth daily, Disp: , Rfl:     Jardiance 25 MG tablet, 1 tablet (25 mg) every morning, Disp: , Rfl:     Multiple Vitamins-Minerals (CENTRUM PO), Take by mouth daily, Disp: , Rfl:     sacubitril -valsartan  (Entresto ) 49-51 MG Tablet per tablet, Take 1 tablet by mouth 2 (two) times daily, Disp: 180 tablet, Rfl: 3    sacubitril -valsartan  (ENTRESTO ) 49-51 MG Tablet per tablet, Take 1 tablet by mouth 2 (two) times daily, Disp: 14 tablet, Rfl: 0  semaglutide (Rybelsus) 7 MG tablet, Take 1 tablet (7 mg) by mouth daily, Disp: , Rfl:     vitamin B-12 (CYANOCOBALAMIN ) 100 MCG tablet, Take 1 tablet (100 mcg) by mouth daily, Disp: , Rfl:

## 2024-04-14 ENCOUNTER — Encounter (INDEPENDENT_AMBULATORY_CARE_PROVIDER_SITE_OTHER): Payer: Self-pay

## 2024-04-21 ENCOUNTER — Ambulatory Visit (INDEPENDENT_AMBULATORY_CARE_PROVIDER_SITE_OTHER): Payer: Medicare Other

## 2024-04-21 ENCOUNTER — Other Ambulatory Visit (INDEPENDENT_AMBULATORY_CARE_PROVIDER_SITE_OTHER): Payer: Medicare Other

## 2024-04-24 ENCOUNTER — Ambulatory Visit

## 2024-11-20 ENCOUNTER — Encounter (INDEPENDENT_AMBULATORY_CARE_PROVIDER_SITE_OTHER)
